# Patient Record
Sex: Male | Born: 1940 | Race: White | Hispanic: No | Marital: Married | Smoking: Current some day smoker
Health system: Southern US, Community
[De-identification: ages and names within clinical notes are randomized; demographics above are authoritative.]

## PROBLEM LIST (undated history)

## (undated) DIAGNOSIS — I10 Essential (primary) hypertension: Secondary | ICD-10-CM

## (undated) DIAGNOSIS — C189 Malignant neoplasm of colon, unspecified: Secondary | ICD-10-CM

## (undated) DIAGNOSIS — I639 Cerebral infarction, unspecified: Secondary | ICD-10-CM

## (undated) DIAGNOSIS — G40919 Epilepsy, unspecified, intractable, without status epilepticus: Secondary | ICD-10-CM

## (undated) DIAGNOSIS — E785 Hyperlipidemia, unspecified: Secondary | ICD-10-CM

## (undated) DIAGNOSIS — J449 Chronic obstructive pulmonary disease, unspecified: Secondary | ICD-10-CM

## (undated) DIAGNOSIS — R4701 Aphasia: Secondary | ICD-10-CM

## (undated) DIAGNOSIS — Z8719 Personal history of other diseases of the digestive system: Secondary | ICD-10-CM

## (undated) DIAGNOSIS — D649 Anemia, unspecified: Secondary | ICD-10-CM

## (undated) DIAGNOSIS — K649 Unspecified hemorrhoids: Secondary | ICD-10-CM

## (undated) DIAGNOSIS — I2510 Atherosclerotic heart disease of native coronary artery without angina pectoris: Secondary | ICD-10-CM

## (undated) DIAGNOSIS — I219 Acute myocardial infarction, unspecified: Secondary | ICD-10-CM

## (undated) DIAGNOSIS — E119 Type 2 diabetes mellitus without complications: Secondary | ICD-10-CM

## (undated) DIAGNOSIS — R0602 Shortness of breath: Secondary | ICD-10-CM

## (undated) HISTORY — PX: APPENDECTOMY: SHX54

## (undated) HISTORY — PX: DENTAL SURGERY: SHX609

## (undated) HISTORY — DX: Atherosclerotic heart disease of native coronary artery without angina pectoris: I25.10

## (undated) HISTORY — DX: Personal history of other diseases of the digestive system: Z87.19

## (undated) HISTORY — PX: CORONARY ARTERY BYPASS GRAFT: SHX141

## (undated) HISTORY — DX: Hyperlipidemia, unspecified: E78.5

## (undated) HISTORY — PX: CORONARY STENT PLACEMENT: SHX1402

## (undated) HISTORY — DX: Epilepsy, unspecified, intractable, without status epilepticus: G40.919

---

## 2001-02-28 ENCOUNTER — Encounter: Payer: Self-pay | Admitting: Emergency Medicine

## 2001-03-01 ENCOUNTER — Inpatient Hospital Stay (HOSPITAL_COMMUNITY): Admission: EM | Admit: 2001-03-01 | Discharge: 2001-03-02 | Payer: Self-pay | Admitting: Emergency Medicine

## 2001-03-01 ENCOUNTER — Encounter: Payer: Self-pay | Admitting: Family Medicine

## 2004-02-13 ENCOUNTER — Inpatient Hospital Stay (HOSPITAL_COMMUNITY): Admission: EM | Admit: 2004-02-13 | Discharge: 2004-02-21 | Payer: Self-pay | Admitting: Emergency Medicine

## 2004-03-14 ENCOUNTER — Encounter
Admission: RE | Admit: 2004-03-14 | Discharge: 2004-03-14 | Payer: Self-pay | Admitting: Thoracic Surgery (Cardiothoracic Vascular Surgery)

## 2006-01-31 ENCOUNTER — Observation Stay (HOSPITAL_COMMUNITY): Admission: EM | Admit: 2006-01-31 | Discharge: 2006-02-01 | Payer: Self-pay | Admitting: Emergency Medicine

## 2010-12-13 ENCOUNTER — Emergency Department (HOSPITAL_COMMUNITY)
Admission: EM | Admit: 2010-12-13 | Discharge: 2010-12-13 | Disposition: A | Discharge status: Home / Self Care | Payer: Medicare Other | Attending: Emergency Medicine | Admitting: Emergency Medicine

## 2010-12-13 DIAGNOSIS — Z79899 Other long term (current) drug therapy: Secondary | ICD-10-CM | POA: Insufficient documentation

## 2010-12-13 DIAGNOSIS — K644 Residual hemorrhoidal skin tags: Secondary | ICD-10-CM | POA: Insufficient documentation

## 2010-12-13 DIAGNOSIS — I2510 Atherosclerotic heart disease of native coronary artery without angina pectoris: Secondary | ICD-10-CM | POA: Insufficient documentation

## 2010-12-13 DIAGNOSIS — E119 Type 2 diabetes mellitus without complications: Secondary | ICD-10-CM | POA: Insufficient documentation

## 2010-12-13 DIAGNOSIS — K6289 Other specified diseases of anus and rectum: Secondary | ICD-10-CM | POA: Insufficient documentation

## 2010-12-13 DIAGNOSIS — Z951 Presence of aortocoronary bypass graft: Secondary | ICD-10-CM | POA: Insufficient documentation

## 2010-12-13 DIAGNOSIS — I10 Essential (primary) hypertension: Secondary | ICD-10-CM | POA: Insufficient documentation

## 2010-12-13 DIAGNOSIS — Z7982 Long term (current) use of aspirin: Secondary | ICD-10-CM | POA: Insufficient documentation

## 2010-12-13 DIAGNOSIS — K602 Anal fissure, unspecified: Secondary | ICD-10-CM | POA: Insufficient documentation

## 2011-12-28 ENCOUNTER — Emergency Department (INDEPENDENT_AMBULATORY_CARE_PROVIDER_SITE_OTHER): Payer: Medicare Other

## 2011-12-28 ENCOUNTER — Inpatient Hospital Stay (HOSPITAL_BASED_OUTPATIENT_CLINIC_OR_DEPARTMENT_OTHER)
Admission: EM | Admit: 2011-12-28 | Discharge: 2012-01-03 | DRG: 065 | Disposition: A | Discharge status: Skilled Nursing Facility | Payer: Medicare Other | Source: Ambulatory Visit | Attending: Internal Medicine | Admitting: Internal Medicine

## 2011-12-28 ENCOUNTER — Encounter (HOSPITAL_BASED_OUTPATIENT_CLINIC_OR_DEPARTMENT_OTHER): Payer: Self-pay | Admitting: Emergency Medicine

## 2011-12-28 DIAGNOSIS — Z7902 Long term (current) use of antithrombotics/antiplatelets: Secondary | ICD-10-CM

## 2011-12-28 DIAGNOSIS — Z79899 Other long term (current) drug therapy: Secondary | ICD-10-CM

## 2011-12-28 DIAGNOSIS — I10 Essential (primary) hypertension: Secondary | ICD-10-CM | POA: Diagnosis present

## 2011-12-28 DIAGNOSIS — I6529 Occlusion and stenosis of unspecified carotid artery: Secondary | ICD-10-CM | POA: Diagnosis present

## 2011-12-28 DIAGNOSIS — I63239 Cerebral infarction due to unspecified occlusion or stenosis of unspecified carotid arteries: Principal | ICD-10-CM | POA: Diagnosis present

## 2011-12-28 DIAGNOSIS — IMO0001 Reserved for inherently not codable concepts without codable children: Secondary | ICD-10-CM | POA: Diagnosis present

## 2011-12-28 DIAGNOSIS — Z043 Encounter for examination and observation following other accident: Secondary | ICD-10-CM

## 2011-12-28 DIAGNOSIS — R569 Unspecified convulsions: Secondary | ICD-10-CM

## 2011-12-28 DIAGNOSIS — R1312 Dysphagia, oropharyngeal phase: Secondary | ICD-10-CM | POA: Diagnosis present

## 2011-12-28 DIAGNOSIS — R4789 Other speech disturbances: Secondary | ICD-10-CM

## 2011-12-28 DIAGNOSIS — E785 Hyperlipidemia, unspecified: Secondary | ICD-10-CM | POA: Diagnosis present

## 2011-12-28 DIAGNOSIS — B370 Candidal stomatitis: Secondary | ICD-10-CM | POA: Diagnosis present

## 2011-12-28 DIAGNOSIS — M5380 Other specified dorsopathies, site unspecified: Secondary | ICD-10-CM

## 2011-12-28 DIAGNOSIS — Y9210 Unspecified residential institution as the place of occurrence of the external cause: Secondary | ICD-10-CM | POA: Diagnosis present

## 2011-12-28 DIAGNOSIS — Y849 Medical procedure, unspecified as the cause of abnormal reaction of the patient, or of later complication, without mention of misadventure at the time of the procedure: Secondary | ICD-10-CM | POA: Diagnosis present

## 2011-12-28 DIAGNOSIS — W19XXXA Unspecified fall, initial encounter: Secondary | ICD-10-CM

## 2011-12-28 DIAGNOSIS — I639 Cerebral infarction, unspecified: Secondary | ICD-10-CM | POA: Diagnosis present

## 2011-12-28 DIAGNOSIS — Z7982 Long term (current) use of aspirin: Secondary | ICD-10-CM

## 2011-12-28 DIAGNOSIS — G8190 Hemiplegia, unspecified affecting unspecified side: Secondary | ICD-10-CM | POA: Diagnosis present

## 2011-12-28 DIAGNOSIS — G319 Degenerative disease of nervous system, unspecified: Secondary | ICD-10-CM

## 2011-12-28 DIAGNOSIS — R4701 Aphasia: Secondary | ICD-10-CM | POA: Diagnosis present

## 2011-12-28 DIAGNOSIS — E1165 Type 2 diabetes mellitus with hyperglycemia: Secondary | ICD-10-CM | POA: Diagnosis present

## 2011-12-28 DIAGNOSIS — I252 Old myocardial infarction: Secondary | ICD-10-CM

## 2011-12-28 DIAGNOSIS — T888XXA Other specified complications of surgical and medical care, not elsewhere classified, initial encounter: Secondary | ICD-10-CM | POA: Diagnosis present

## 2011-12-28 DIAGNOSIS — Z794 Long term (current) use of insulin: Secondary | ICD-10-CM

## 2011-12-28 HISTORY — DX: Acute myocardial infarction, unspecified: I21.9

## 2011-12-28 LAB — CBC
HCT: 41.9 % (ref 39.0–52.0)
HCT: 39.7 % (ref 39.0–52.0)
Hemoglobin: 13.4 g/dL (ref 13.0–17.0)
MCH: 27.2 pg (ref 26.0–34.0)
MCHC: 35.1 g/dL (ref 30.0–36.0)
MCV: 80.7 fL (ref 78.0–100.0)
Platelets: 185 10*3/uL (ref 150–400)
RBC: 5.37 MIL/uL (ref 4.22–5.81)
RBC: 4.92 MIL/uL (ref 4.22–5.81)
WBC: 6.3 10*3/uL (ref 4.0–10.5)

## 2011-12-28 LAB — GLUCOSE, CAPILLARY
Glucose-Capillary: 379 mg/dL — ABNORMAL HIGH (ref 70–99)
Glucose-Capillary: 264 mg/dL — ABNORMAL HIGH (ref 70–99)

## 2011-12-28 LAB — BASIC METABOLIC PANEL
CO2: 27 mEq/L (ref 19–32)
Calcium: 10.5 mg/dL (ref 8.4–10.5)
Chloride: 95 mEq/L — ABNORMAL LOW (ref 96–112)
Creatinine, Ser: 1 mg/dL (ref 0.50–1.35)
GFR calc Af Amer: 86 mL/min — ABNORMAL LOW (ref >90)
Potassium: 4.9 mEq/L (ref 3.5–5.1)
Sodium: 133 mEq/L — ABNORMAL LOW (ref 135–145)

## 2011-12-28 LAB — ETHANOL: Alcohol, Ethyl (B): <11 mg/dL (ref 0–11)

## 2011-12-28 LAB — CREATININE, SERUM: GFR calc Af Amer: >90 mL/min (ref >90)

## 2011-12-28 LAB — PROTIME-INR: INR: 0.89 (ref 0.00–1.49)

## 2011-12-28 MED ORDER — ACETAMINOPHEN 650 MG RE SUPP: 650.0000 mg | RECTAL | Status: DC | PRN

## 2011-12-28 MED ORDER — SODIUM CHLORIDE 0.9 % IV BOLUS (SEPSIS)
1000.0000 mL | Freq: Once | INTRAVENOUS | Status: AC
  Administered 2011-12-28: 1000 mL via INTRAVENOUS

## 2011-12-28 MED ORDER — INSULIN ASPART 100 UNIT/ML ~~LOC~~ SOLN
0.0000 [IU] | SUBCUTANEOUS | Status: DC
  Administered 2011-12-28: 5 [IU] via SUBCUTANEOUS
  Administered 2011-12-29 (×3): 3 [IU] via SUBCUTANEOUS
  Administered 2011-12-29: 1 [IU] via SUBCUTANEOUS
  Administered 2011-12-29 – 2011-12-30 (×2): 2 [IU] via SUBCUTANEOUS

## 2011-12-28 MED ORDER — SENNOSIDES-DOCUSATE SODIUM 8.6-50 MG PO TABS: 1.0000 | ORAL_TABLET | Freq: Every evening | ORAL | Status: DC | PRN

## 2011-12-28 MED ORDER — SODIUM CHLORIDE 0.9 % IV SOLN
INTRAVENOUS | Status: DC
  Administered 2011-12-29: 16:00:00 via INTRAVENOUS
  Administered 2011-12-29: 125 mL/h via INTRAVENOUS

## 2011-12-28 MED ORDER — PANTOPRAZOLE SODIUM 40 MG IV SOLR
40.0000 mg | INTRAVENOUS | Status: DC
  Administered 2011-12-28: 40 mg via INTRAVENOUS
  Filled 2011-12-28 (×2): qty 40

## 2011-12-28 MED ORDER — ONDANSETRON HCL 4 MG/2ML IJ SOLN: 4.0000 mg | Freq: Four times a day (QID) | INTRAMUSCULAR | Status: DC | PRN

## 2011-12-28 MED ORDER — ENOXAPARIN SODIUM 40 MG/0.4ML ~~LOC~~ SOLN
40.0000 mg | Freq: Every day | SUBCUTANEOUS | Status: DC
  Administered 2011-12-28 – 2011-12-29 (×2): 40 mg via SUBCUTANEOUS
  Filled 2011-12-28 (×3): qty 0.4

## 2011-12-28 MED ORDER — ASPIRIN 300 MG RE SUPP
300.0000 mg | Freq: Every day | RECTAL | Status: DC
  Filled 2011-12-28 (×4): qty 1

## 2011-12-28 MED ORDER — HYDRALAZINE HCL 20 MG/ML IJ SOLN
5.0000 mg | Freq: Three times a day (TID) | INTRAMUSCULAR | Status: DC | PRN
  Filled 2011-12-28: qty 0.25

## 2011-12-28 MED ORDER — ASPIRIN 325 MG PO TABS
325.0000 mg | ORAL_TABLET | Freq: Every day | ORAL | Status: DC
  Administered 2011-12-29 – 2012-01-01 (×4): 325 mg via ORAL
  Filled 2011-12-28 (×4): qty 1

## 2011-12-28 MED ORDER — ACETAMINOPHEN 325 MG PO TABS: 650.0000 mg | ORAL_TABLET | ORAL | Status: DC | PRN

## 2011-12-28 MED ORDER — SODIUM CHLORIDE 0.9 % IV SOLN
INTRAVENOUS | Status: AC
  Administered 2011-12-28: 15:00:00 via INTRAVENOUS

## 2011-12-28 NOTE — ED Notes (Signed)
Family at bedside. 

## 2011-12-28 NOTE — ED Notes (Signed)
Returned from CT.  No change in pt condition.

## 2011-12-28 NOTE — H&P (Signed)
PCP:   Betsey Holiday   Chief Complaint:  Larey Seat.  HPI: [name] was transferred from [facility] after fall on the porch. History not clear. He was found to have dysarthric speech and right sided weakness. CT brain did not show a bleed. As he was discovered on the porch in the morning, with him last said to be normal last night, he was outside tpa window, hence code stroke not called at med center. Patient attempts to speak but he is dysarthric and difficult to understand but follows instructions.  Review of Systems:  The patient denies anorexia, fever, weight loss,, vision loss, decreased hearing, hoarseness, chest pain, syncope, dyspnea on exertion, peripheral edema, balance deficits, hemoptysis, abdominal pain, melena, hematochezia, severe indigestion/heartburn, hematuria, incontinence, genital sores, muscle weakness, suspicious skin lesions, transient blindness, difficulty walking, depression, unusual weight change, abnormal bleeding, enlarged lymph nodes, angioedema, and breast masses.  Past Medical History: Past Medical History  Diagnosis Date  . Myocardial infarction    History reviewed. No pertinent past surgical history.  Medications: Prior to Admission medications   Medication Sig Start Date End Date Taking? Authorizing Provider  aspirin 81 MG tablet Take 81 mg by mouth daily.   Yes Historical Provider, MD  doxycycline (DORYX) 100 MG DR capsule Take 100 mg by mouth 2 (two) times daily. Until gone #20 filled on 12/26/11   Yes Historical Provider, MD  ezetimibe-simvastatin (VYTORIN) 10-40 MG per tablet Take 1 tablet by mouth at bedtime.   Yes Historical Provider, MD  oxazepam (SERAX) 30 MG capsule Take 30 mg by mouth at bedtime.   Yes Historical Provider, MD  sitaGLIPtan-metformin (JANUMET) 50-1000 MG per tablet Take 1 tablet by mouth 2 (two) times daily with a meal.   Yes Historical Provider, MD  valsartan (DIOVAN) 160 MG tablet Take 160 mg by mouth daily.   Yes  Historical Provider, MD    Allergies:  No Known Allergies  Social History:  has an unknown smoking status. He does not have any smokeless tobacco history on file. He reports that he does not drink alcohol or use illicit drugs.   Family History: History reviewed. No pertinent family history.  Physical Exam: Filed Vitals:   12/28/11 1415 12/28/11 1529 12/28/11 1532 12/28/11 1630  BP: 96/57 120/65  133/79  Pulse: 59 55  61  Temp:      TempSrc:      Resp:  16  16  Height:      Weight:      SpO2: 100% 100% 100% 100%   Lying in bed, not in distress. PERRLA. No jvd, no carotid bruits. No facial asymmetry. Lungs clear. S1S2 heard. No murmurs. RRR. Abdomen soft, non tender. +BS CNS- dysarthric, power 3/5 right arm, 4/5 right leg. Extremities- no pedal edema.   Labs on Admission:   Basename 12/28/11 1005  NA 133*  K 4.9  CL 95*  CO2 27  GLUCOSE 508*  BUN 20  CREATININE 1.00  CALCIUM 10.5  MG --  PHOS --   No results found for this basename: AST:2,ALT:2,ALKPHOS:2,BILITOT:2,PROT:2,ALBUMIN:2 in the last 72 hours No results found for this basename: LIPASE:2,AMYLASE:2 in the last 72 hours  Basename 12/28/11 1005  WBC 6.3  NEUTROABS --  HGB 14.7  HCT 41.9  MCV 78.0  PLT 184    Basename 12/28/11 1005  CKTOTAL --  CKMB --  CKMBINDEX --  TROPONINI <0.30   No results found for this basename: TSH,T4TOTAL,FREET3,T3FREE,THYROIDAB in the last 72 hours No results  found for this basename: VITAMINB12:2,FOLATE:2,FERRITIN:2,TIBC:2,IRON:2,RETICCTPCT:2 in the last 72 hours  Radiological Exams on Admission: Dg Chest 2 View  12/28/2011  *RADIOLOGY REPORT*  Clinical Data: Fall, slurred speech.  CHEST - 2 VIEW  Comparison: None.  Findings: Prior CABG. Heart and mediastinal contours are within normal limits.  No focal opacities or effusions.  No acute bony abnormality.  IMPRESSION: No active cardiopulmonary disease.  Original Report Authenticated By: Cyndie Chime, M.D.   Ct Head Wo  Contrast  12/28/2011  *RADIOLOGY REPORT*  Clinical Data:  Fall, trauma, seizure  CT HEAD WITHOUT CONTRAST CT CERVICAL SPINE WITHOUT CONTRAST  Technique:  Multidetector CT imaging of the head and cervical spine was performed following the standard protocol without intravenous contrast.  Multiplanar CT image reconstructions of the cervical spine were also generated.  Comparison:  None  CT HEAD  Findings: No intracranial hemorrhage.  No parenchymal contusion. No midline shift or mass effect.  Basilar cisterns are patent. No skull base fracture.  No fluid in the paranasal sinuses or mastoid air cells.  There is periventricular white matter hypodensities.  There is generalized cortical atrophy.  IMPRESSION: No intracranial trauma.  Atrophy and microvascular disease.  CT CERVICAL SPINE  Findings: There is reversal of normal cervical lordosis.  No prevertebral soft tissue swelling.  The there is endplate osteophytosis joint space narrowing at C6-C7.  No loss vertebral body height q. leak.  Normal facet to dilation.  Normal precervical junction.  There is some stranding within the subcutaneous tissues posterior X of the occipital lobe and C2 vertebral body (image 30)  No epidural hematoma is evident.  IMPRESSION:  1.  No evidence of acute cervical spine fracture. 2.  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 3.  Subcutaneous stranding posterior to the C2 vertebral body may relate to trauma. 4. Straightening of the normal cervical lordosis may be secondary to position, muscle spasm, or ligamentous injury.  Original Report Authenticated By: Genevive Bi, M.D.   Ct Cervical Spine Wo Contrast  12/28/2011  *RADIOLOGY REPORT*  Clinical Data:  Fall, trauma, seizure  CT HEAD WITHOUT CONTRAST CT CERVICAL SPINE WITHOUT CONTRAST  Technique:  Multidetector CT imaging of the head and cervical spine was performed following the standard protocol without intravenous contrast.  Multiplanar CT image reconstructions of the  cervical spine were also generated.  Comparison:  None  CT HEAD  Findings: No intracranial hemorrhage.  No parenchymal contusion. No midline shift or mass effect.  Basilar cisterns are patent. No skull base fracture.  No fluid in the paranasal sinuses or mastoid air cells.  There is periventricular white matter hypodensities.  There is generalized cortical atrophy.  IMPRESSION: No intracranial trauma.  Atrophy and microvascular disease.  CT CERVICAL SPINE  Findings: There is reversal of normal cervical lordosis.  No prevertebral soft tissue swelling.  The there is endplate osteophytosis joint space narrowing at C6-C7.  No loss vertebral body height q. leak.  Normal facet to dilation.  Normal precervical junction.  There is some stranding within the subcutaneous tissues posterior X of the occipital lobe and C2 vertebral body (image 30)  No epidural hematoma is evident.  IMPRESSION:  1.  No evidence of acute cervical spine fracture. 2.  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 3.  Subcutaneous stranding posterior to the C2 vertebral body may relate to trauma. 4. Straightening of the normal cervical lordosis may be secondary to position, muscle spasm, or ligamentous injury.  Original Report Authenticated By: Genevive Bi, M.D.  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 ? Cause. Plan .CVA (cerebral infarction)- admit tele. Stroke protocol. ASA. Risk factor modification. Consider stroke team consult in am. .DM (diabetes mellitus), type 2, uncontrolled- check hb a1c. SSI for now. Marland KitchenHTN (hypertension), benign- hydralazine prn. Dvt/gi prophylaxis. Condition closely guarded.  Kyasia Steuck [phone]. 12/28/2011, 6:43 PM

## 2011-12-28 NOTE — ED Notes (Signed)
Initial assessment documented as pt had a prior CVA with right sided weakness per EMS.  Family reports pt has NO history of prior CVA, right sided weakness or slurred speech.  Wife states pt awakened about 0700 this morning, had coffee but she is unable to recall if she actually spoke with him. States pt went outside "to turn water on" walked onto porch and she saw him laying on the porch.  EMS called and pt transported to ED. C-collar removed per MD order.

## 2011-12-28 NOTE — ED Provider Notes (Signed)
History     CSN: [id number]  Arrival date & time 12/28/11  8295   First MD Initiated Contact with Patient 12/28/11 1002      Chief Complaint  Patient presents with  . Fall     The history is provided by the EMS personnel. History Limited By: Level V caveat: Dysarthric speech.   EMS reports they were called to the patient's house today because of a fall on the porch.  EMS reports the patient was noted to have dysarthric speech and right-sided weakness which the wife reported was baseline for him as there was possibly a prior history of stroke.  The patient complained of generalized pain to them but was able to move all extremities.  EMS did note his weakness in his right arm.  The patient has a history of hypertension diabetes and high cholesterol.  Some clear he smokes cigarettes.  There is no family available to provide additional history.   Past medical history: Hypertension, diabetes, hyperlipidemia  History reviewed. No pertinent past surgical history.  History reviewed. No pertinent family history.  History  Substance Use Topics  . Smoking status: Unknown If Ever Smoked  . Smokeless tobacco: Not on file  . Alcohol Use: No      Review of Systems  Unable to perform ROS   Allergies  Review of patient's allergies indicates no known allergies.  Home Medications   Current Outpatient Rx  Name Route Sig Dispense Refill  . ASPIRIN 81 MG PO TABS Oral Take 81 mg by mouth daily.    Marland Kitchen DOXYCYCLINE HYCLATE 100 MG PO CPEP Oral Take 100 mg by mouth 2 (two) times daily. Until gone #20 filled on 12/26/11    . EZETIMIBE-SIMVASTATIN 10-40 MG PO TABS Oral Take 1 tablet by mouth at bedtime.    Marland Kitchen OXAZEPAM 30 MG PO CAPS Oral Take 30 mg by mouth at bedtime.    Marland Kitchen SITAGLIPTIN-METFORMIN HCL 50-1000 MG PO TABS Oral Take 1 tablet by mouth 2 (two) times daily with a meal.    . VALSARTAN 160 MG PO TABS Oral Take 160 mg by mouth daily.      BP 111/62  Pulse 64  Temp(Src) 98 F (36.7 C) (Oral)   Resp 20  SpO2 100%  Physical Exam  Nursing note and vitals reviewed. Constitutional: He appears well-developed and well-nourished.  HENT:  Head: Normocephalic and atraumatic.  Eyes: EOM are normal.  Neck: Normal range of motion.  Cardiovascular: Normal rate, regular rhythm, normal heart sounds and intact distal pulses.   Pulmonary/Chest: Effort normal and breath sounds normal. No respiratory distress. He exhibits no tenderness.  Abdominal: Soft. He exhibits no distension. There is no tenderness. There is no rebound and no guarding.  Musculoskeletal: Normal range of motion.       Small right posterior calf erythema with necrotic middle without spreading erythema or fluctuance  Neurological: He is alert.       Follows commands.  Speech is dysarthric.4/5 right arm and right leg strength as compared to 5/5 left arm and left leg strength.   Skin: Skin is warm and dry.  Psychiatric: He has a normal mood and affect. Judgment normal.    ED Course  Procedures     Date: 12/28/2011  Rate: 68  Rhythm: normal sinus rhythm  QRS Axis: normal  Intervals: normal  ST/T Wave abnormalities: normal  Conduction Disutrbances: none  Narrative Interpretation:   Old EKG Reviewed: No significant changes noted    Labs Reviewed  BASIC METABOLIC PANEL - Abnormal; Notable for the following:    Sodium 133 (*)    Chloride 95 (*)    Glucose, Bld 508 (*)    GFR calc non Af Amer 74 (*)    GFR calc Af Amer 86 (*)    All other components within normal limits  CBC  ETHANOL  PROTIME-INR  TROPONIN I   Dg Chest 2 View  12/28/2011  *RADIOLOGY REPORT*  Clinical Data: Fall, slurred speech.  CHEST - 2 VIEW  Comparison: None.  Findings: Prior CABG. Heart and mediastinal contours are within normal limits.  No focal opacities or effusions.  No acute bony abnormality.  IMPRESSION: No active cardiopulmonary disease.  Original Report Authenticated By: Cyndie Chime, M.D.   Ct Head Wo Contrast  12/28/2011   *RADIOLOGY REPORT*  Clinical Data:  Fall, trauma, seizure  CT HEAD WITHOUT CONTRAST CT CERVICAL SPINE WITHOUT CONTRAST  Technique:  Multidetector CT imaging of the head and cervical spine was performed following the standard protocol without intravenous contrast.  Multiplanar CT image reconstructions of the cervical spine were also generated.  Comparison:  None  CT HEAD  Findings: No intracranial hemorrhage.  No parenchymal contusion. No midline shift or mass effect.  Basilar cisterns are patent. No skull base fracture.  No fluid in the paranasal sinuses or mastoid air cells.  There is periventricular white matter hypodensities.  There is generalized cortical atrophy.  IMPRESSION: No intracranial trauma.  Atrophy and microvascular disease.  CT CERVICAL SPINE  Findings: There is reversal of normal cervical lordosis.  No prevertebral soft tissue swelling.  The there is endplate osteophytosis joint space narrowing at C6-C7.  No loss vertebral body height q. leak.  Normal facet to dilation.  Normal precervical junction.  There is some stranding within the subcutaneous tissues posterior X of the occipital lobe and C2 vertebral body (image 30)  No epidural hematoma is evident.  IMPRESSION:  1.  No evidence of acute cervical spine fracture. 2.  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 3.  Subcutaneous stranding posterior to the C2 vertebral body may relate to trauma. 4. Straightening of the normal cervical lordosis may be secondary to position, muscle spasm, or ligamentous injury.  Original Report Authenticated By: Genevive Bi, M.D.   Ct Cervical Spine Wo Contrast  12/28/2011  *RADIOLOGY REPORT*  Clinical Data:  Fall, trauma, seizure  CT HEAD WITHOUT CONTRAST CT CERVICAL SPINE WITHOUT CONTRAST  Technique:  Multidetector CT imaging of the head and cervical spine was performed following the standard protocol without intravenous contrast.  Multiplanar CT image reconstructions of the cervical spine were also  generated.  Comparison:  None  CT HEAD  Findings: No intracranial hemorrhage.  No parenchymal contusion. No midline shift or mass effect.  Basilar cisterns are patent. No skull base fracture.  No fluid in the paranasal sinuses or mastoid air cells.  There is periventricular white matter hypodensities.  There is generalized cortical atrophy.  IMPRESSION: No intracranial trauma.  Atrophy and microvascular disease.  CT CERVICAL SPINE  Findings: There is reversal of normal cervical lordosis.  No prevertebral soft tissue swelling.  The there is endplate osteophytosis joint space narrowing at C6-C7.  No loss vertebral body height q. leak.  Normal facet to dilation.  Normal precervical junction.  There is some stranding within the subcutaneous tissues posterior X of the occipital lobe and C2 vertebral body (image 30)  No epidural hematoma is evident.  IMPRESSION:  1.  No evidence of acute  cervical spine fracture. 2.  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 3.  Subcutaneous stranding posterior to the C2 vertebral body may relate to trauma. 4. Straightening of the normal cervical lordosis may be secondary to position, muscle spasm, or ligamentous injury.  Original Report Authenticated By: Genevive Bi, M.D.   I personally reviewed the images  1. Stroke       MDM  The patient is on a candidate for TPA as there is no definitive last seen normal time besides yesterday evening.  Initially the EMS reported that the patient's slurred speech and weakness was baseline however when family showed up they've reported that these were new findings.  From a trauma fall standpoint he had a chest x-ray CT head CT C-spine all of which was normal.  He has full range of motion of his major joints bilaterally in both his upper and lower extremities.  He does have slurred speech as well as right arm or right leg weakness.  Per family this is new. 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  The family also reports increasing  falls over the past month.  He reports he saw his doctor this week for a new lesion on his right posterior calf that was a possible bug bite for which she was started on doxycycline.      12:34 PM Accepted to [facility] by Dr Venetia Constable. Requests tx of his hyperglycemia  Lyanne Co, MD 12/28/11 1242

## 2011-12-28 NOTE — ED Notes (Signed)
Pt assisted to [facility] and loose stool noted.  [facility] at bedside.

## 2011-12-28 NOTE — ED Notes (Signed)
Patient transported to CT 

## 2011-12-28 NOTE — ED Notes (Signed)
Pt arrived via [facility] fully immobilized for fall on front porch. Pt c/o generalized pain. Pt able to move all extremities. Pt has weakness to right arm per EMS with injury to right arm during fall. Wife sts pt is at baseline with slurred speech per EMS.

## 2011-12-28 NOTE — ED Notes (Signed)
MD order to keep pt NPO.  Speech is slurred and swallow evaluation performed (see documentation)

## 2011-12-28 NOTE — ED Notes (Signed)
Dr. Patria Mane at bedside speaking with family.

## 2011-12-28 NOTE — ED Notes (Signed)
Family informed of plan of care.  Awaiting admission bed. EMTALA consent obtained from daughter and placed on chart.

## 2011-12-29 ENCOUNTER — Inpatient Hospital Stay (HOSPITAL_COMMUNITY): Payer: Medicare Other

## 2011-12-29 DIAGNOSIS — I6789 Other cerebrovascular disease: Secondary | ICD-10-CM

## 2011-12-29 LAB — COMPREHENSIVE METABOLIC PANEL
ALT: 7 U/L (ref 0–53)
ALT: 8 U/L (ref 0–53)
AST: 19 U/L (ref 0–37)
Alkaline Phosphatase: 75 U/L (ref 39–117)
BUN: 14 mg/dL (ref 6–23)
CO2: 22 mEq/L (ref 19–32)
CO2: 23 mEq/L (ref 19–32)
Chloride: 106 mEq/L (ref 96–112)
Chloride: 104 mEq/L (ref 96–112)
Creatinine, Ser: 0.8 mg/dL (ref 0.50–1.35)
GFR calc Af Amer: >90 mL/min (ref >90)
GFR calc Af Amer: >90 mL/min (ref >90)
GFR calc non Af Amer: 88 mL/min — ABNORMAL LOW (ref >90)
Glucose, Bld: 147 mg/dL — ABNORMAL HIGH (ref 70–99)
Glucose, Bld: 256 mg/dL — ABNORMAL HIGH (ref 70–99)
Potassium: 3.8 mEq/L (ref 3.5–5.1)
Sodium: 137 mEq/L (ref 135–145)
Total Bilirubin: 0.2 mg/dL — ABNORMAL LOW (ref 0.3–1.2)
Total Bilirubin: 0.3 mg/dL (ref 0.3–1.2)

## 2011-12-29 LAB — CBC
HCT: 36.8 % — ABNORMAL LOW (ref 39.0–52.0)
Hemoglobin: 12.6 g/dL — ABNORMAL LOW (ref 13.0–17.0)
RBC: 4.6 MIL/uL (ref 4.22–5.81)
WBC: 6.4 10*3/uL (ref 4.0–10.5)

## 2011-12-29 LAB — GLUCOSE, CAPILLARY
Glucose-Capillary: 230 mg/dL — ABNORMAL HIGH (ref 70–99)
Glucose-Capillary: 247 mg/dL — ABNORMAL HIGH (ref 70–99)
Glucose-Capillary: 93 mg/dL (ref 70–99)

## 2011-12-29 LAB — HEMOGLOBIN A1C
Hgb A1c MFr Bld: 14.4 % — ABNORMAL HIGH (ref <5.7)
Mean Plasma Glucose: 367 mg/dL — ABNORMAL HIGH (ref <117)

## 2011-12-29 LAB — TSH: TSH: 1.374 u[IU]/mL (ref 0.350–4.500)

## 2011-12-29 LAB — LIPID PANEL: Cholesterol: 172 mg/dL (ref 0–200)

## 2011-12-29 MED ORDER — STARCH (THICKENING) PO POWD
ORAL | Status: DC | PRN
  Filled 2011-12-29: qty 227

## 2011-12-29 MED ORDER — EZETIMIBE-SIMVASTATIN 10-40 MG PO TABS
1.0000 | ORAL_TABLET | Freq: Every day | ORAL | Status: DC
  Administered 2011-12-29 – 2012-01-02 (×5): 1 via ORAL
  Filled 2011-12-29 (×6): qty 1

## 2011-12-29 MED ORDER — PANTOPRAZOLE SODIUM 40 MG PO TBEC
40.0000 mg | DELAYED_RELEASE_TABLET | Freq: Every day | ORAL | Status: DC
  Administered 2011-12-29 – 2012-01-02 (×5): 40 mg via ORAL
  Filled 2011-12-29 (×5): qty 1

## 2011-12-29 NOTE — Procedures (Signed)
Objective Swallowing Evaluation: Modified Barium Swallowing Study  Patient Details  Name: [name] MRN: [id number] Date of Birth: Nov 17, 1940  Today's Date: 12/29/2011 Time:  -     Past Medical History:  Past Medical History  Diagnosis Date  . Myocardial infarction    Past Surgical History: History reviewed. No pertinent past surgical history. HPI:  Pt is a 71 yo male adm to [facility] after falling on porch, pt found to have right sided weakness, dysarthric with ability to follow commands.  CT Head showed perventricular white matter changes, nothing acute.  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  CXR 12/28/11 no acute disease.  Pt did not pass RN stroke swallow screen and bedside swallow evaluation was subsequently ordered.  Per RN, pt resides at home with his wife, no family present to etablish baseline function.  BSE indicated signs of significant dysphagia, MBS to determine safety with POs.      Recommendation/Prognosis  Clinical Impression Dysphagia Diagnosis: Moderate oral phase dysphagia;Moderate pharyngeal phase dysphagia Clinical impression: Mr. Nuttall presents with a moderate oral dysphagia due to right lingual weakness with reduced propulsion of bolus which results in lingual residuals falling to pharynx post swallow. Oral dysphagia also causes the pt to utilize a posterior head tilt to aid in bolus transit. This posture increases risk of aspiration due to already present sensory deficits resulting in delay in swallow initiaion to the pyriform sinuses with nectar thick liquids.  In one instance, this resulted in gross silent aspiration of nectar thick liquids. Due to cognitive linguistic impairment the pt is not able to utlize compensatory strategies or reliably follow commands.  At this time he is recommended to initiate a dys 1 (puree) diet with honey thick liquids. If pt is able to take small sips with apprioriate posture nectar thick liquids may be considered.    Swallow Evaluation Recommendations Recommended Consults: MBS Diet Recommendations: Dysphagia 1 (Puree);Honey-thick liquid Liquid Administration via: Cup;No straw Medication Administration: Crushed with puree Supervision: Full supervision/cueing for compensatory strategies;Patient able to self feed Compensations: Slow rate;Small sips/bites;Clear throat intermittently Postural Changes and/or Swallow Maneuvers: Seated upright 90 degrees;Upright 30-60 min after meal Oral Care Recommendations: Oral care QID Other Recommendations: Order thickener from pharmacy Recommendations for Other Services: Rehab consult Follow up Recommendations: Inpatient Rehab Prognosis Prognosis for Safe Diet Advancement: Good Barriers to Reach Goals: Severity of dysphagia Individuals Consulted Consulted and Agree with Results and Recommendations: Patient unable/family or caregiver not available;MD (pt informed of plan,? ability for consent d/t speech deficit)  SLP Assessment/Plan Dysphagia Diagnosis: Moderate oral phase dysphagia;Moderate pharyngeal phase dysphagia Clinical impression: Mr. Ruhe presents with a moderate oral dysphagia due to right lingual weakness with reduced propulsion of bolus which results in lingual residuals falling to pharynx post swallow. Oral dysphagia also causes the pt to utilize a posterior head tilt to aid in bolus transit. This posture increases risk of aspiration due to already present sensory deficits resulting in delay in swallow initiaion to the pyriform sinuses with nectar thick liquids.  In one instance, this resulted in gross silent aspraiton of nectar thick liquids. Due to cognitive linguistic impairment the pt is not able to utlize compensatory strategies or reliably follow commands.  At this time he is recommended to initiate a dys 1 (puree) diet with honey thick liquids. If pt is able to take small sips with apprioriate posture nectar thick liquids may be considered.   SLP  Goals  SLP Swallowing Goals Patient will consume recommended diet without observed  clinical signs of aspiration with: Moderate assistance Patient will utilize recommended strategies during swallow to increase swallowing safety with: Moderate cueing  General:  Date of Onset: 12/28/11 HPI: Pt is a 71 yo male adm to [facility] hospital after falling on porch, pt found to have right sided weakness, dysarthric with ability to follow commands.  CT Head showed perventricular white matter changes, nothing acute.  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  CXR 12/28/11 no acute disease.  Pt did not pass RN stroke swallow screen and bedside swallow evaluation was subsequently ordered.  Per RN, pt resides at home with his wife, no family present to etablish baseline function.  BSE indicated signs of significant dysphagia, MBS to determine safety with POs.  Type of Study: Modified Barium Swallowing Study Previous Swallow Assessment: none in [facility] charting system Diet Prior to this Study: NPO Temperature Spikes Noted: No Respiratory Status: Room air History of Intubation: No Behavior/Cognition: Alert;Cooperative Oral Cavity - Dentition: Edentulous Oral Motor / Sensory Function: Impaired - see Bedside swallow eval Vision: Functional for self-feeding Patient Positioning: Upright in chair Baseline Vocal Quality: Low vocal intensity Volitional Cough: Cognitively unable to elicit Volitional Swallow: Unable to elicit Anatomy: Other (Comment) (Appearance of bony protrusion at C5/6. Asymptomatic) Pharyngeal Secretions: Not observed secondary MBS  Oral Phase Oral Preparation/Oral Phase Oral Phase: Impaired Oral - Honey Oral - Honey Cup: Lingual/palatal residue;Weak lingual manipulation;Incomplete tongue to palate contact;Reduced posterior propulsion;Delayed oral transit;Right anterior bolus loss (Pt utilizing head tilt to transit bolus) Oral - Nectar Oral - Nectar Teaspoon: Lingual/palatal residue;Weak lingual  manipulation;Incomplete tongue to palate contact;Reduced posterior propulsion;Delayed oral transit;Right anterior bolus loss Oral - Nectar Cup: Lingual/palatal residue;Weak lingual manipulation;Incomplete tongue to palate contact;Reduced posterior propulsion;Delayed oral transit;Right anterior bolus loss Oral - Solids Oral - Puree: Lingual/palatal residue;Weak lingual manipulation;Incomplete tongue to palate contact;Reduced posterior propulsion;Delayed oral transit;Right anterior bolus loss Oral - Mechanical Soft: Impaired mastication;Weak lingual manipulation;Incomplete tongue to palate contact;Reduced posterior propulsion;Delayed oral transit;Lingual/palatal residue Oral - Pill: Lingual/palatal residue;Weak lingual manipulation;Incomplete tongue to palate contact;Reduced posterior propulsion;Delayed oral transit;Holding of bolus (Pill removed by SLP, pt unable to orally transit with puree) Pharyngeal Phase  Pharyngeal Phase Pharyngeal Phase: Impaired Pharyngeal - Honey Pharyngeal - Honey Cup: Delayed swallow initiation;Premature spillage to valleculae;Pharyngeal residue - pyriform;Pharyngeal residue - valleculae Pharyngeal - Nectar Pharyngeal - Nectar Teaspoon: Premature spillage to pyriform;Delayed swallow initiation;Penetration/Aspiration before swallow;Pharyngeal residue - pyriform;Pharyngeal residue - valleculae Penetration/Aspiration details (nectar teaspoon): Material enters airway, remains ABOVE vocal cords and not ejected out Pharyngeal - Nectar Cup: Premature spillage to pyriform;Delayed swallow initiation;Penetration/Aspiration before swallow;Pharyngeal residue - valleculae;Pharyngeal residue - pyriform;Significant aspiration (Amount);Compensatory strategies attempted (Comment) (chin tuck, cues for small sips, ineffective. ) Penetration/Aspiration details (nectar cup): Material enters airway, passes BELOW cords without attempt by patient to eject out (silent aspiration);Material does not  enter airway;Material enters airway, CONTACTS cords and not ejected out Pharyngeal - Solids Pharyngeal - Puree: Premature spillage to valleculae;Delayed swallow initiation;Pharyngeal residue - valleculae;Pharyngeal residue - pyriform Pharyngeal - Mechanical Soft: Reduced tongue base retraction;Pharyngeal residue - valleculae;Pharyngeal residue - pyriform;Delayed swallow initiation;Premature spillage to valleculae (Pt sneezed and sucked majority of bolus from base of tongue ) Cervical Esophageal Phase  Cervical Esophageal Phase Cervical Esophageal Phase: [facility]  Harlon Ditty, MA CCC-SLP [phone]  Claudine Mouton 12/29/2011, 11:26 AM

## 2011-12-29 NOTE — Evaluation (Addendum)
Clinical/Bedside Swallow Evaluation Patient Details  Name: ZYAN COBY MRN: [id number] DOB: 1941-07-06 Today's Date: 12/29/2011  Past Medical History:  Past Medical History  Diagnosis Date  . Myocardial infarction    Past Surgical History: History reviewed. No pertinent past surgical history. HPI:  Pt is a 71 yo male adm to [facility] hospital after falling on porch, pt found to have right sided weakness, dysarthric with ability to follow commands.  CT Head showed perventricular white matter changes, nothing acute.  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  CXR 12/28/11 no acute disease.  Pt did not pass RN stroke swallow screen and bedside swallow evaluation was subsequently ordered.  Per RN, pt resides at home with his wife, no family present to establish baseline function.     Assessment/Recommendations/Treatment Plan   SLP Assessment Clinical Impression Statement: Pt currently presents with s/s of oropharyngeal dysphagia with glossopharyngeal, vagus, hypoglossal, and possible trigeminal impairment.  Retained viscous secretions removed from soft palate with toothette by SLP after pt did not expectorate secretions per SLP verbal cue.  Dysphagia characterized by suspected delayed oral transting with suspected pharyngeal delay and multiple swallows (possibly indicative of residuals).  Delayed cough noted which is possbily due to aspirates reaching the carina.  In addition, pt's cervical osteophytes (C5-C7 per DG C-spine) may impact pharyngeal clearance.  Rec instrumental evaluation to determine if pt is appropriate for po diet and diet consistency.  Clinical bedside swallow testing was abbreviated today due to MD arriving to evaluate pt.    Risk for Aspiration: Severe Other Related Risk Factors: Lethargy;Decreased management of secretions;Decreased respiratory status  Swallow Evaluation Recommendations Recommended Consults: MBS Diet Recommendations: NPO Medication Administration: Via  alternative means Oral Care Recommendations: Oral care QID Follow up Recommendations: Other (comment) (possibly CIR or SNF depending on level of assist at home)  Treatment Plan Treatment Plan Recommendations: F/U MBS in ___ days (Comment) (today- plan to be determined after MBS)  Prognosis Prognosis for Safe Diet Advancement: Guarded Barriers to Reach Goals: Severity of dysphagia  Individuals Consulted Consulted and Agree with Results and Recommendations: Patient unable/family or caregiver not available;MD (pt informed of plan,? ability for pt to consent d/t speech/cogling deficit)  Swallowing Goals  SLP Swallowing Goals Goal #3: To be determined after MBS  Swallow Study Prior Functional Status   Uncertain of previous status, family not present.    General  Date of Onset: 12/28/11 HPI: Pt is a 71 yo male adm to [facility] hospital after falling on porch, pt found to have right sided weakness, dysarthric with ability to follow commands.  CT Head showed perventricular white matter changes, nothing acute.  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  CXR 12/28/11 no acute disease.  Pt did not pass RN stroke swallow screen and bedside swallow evaluation was subsequently ordered.  Per RN, pt resides at home with his wife, no family present to etablish baseline function.   Type of Study: Bedside swallow evaluation Previous Swallow Assessment: none in [facility] charting system Diet Prior to this Study: NPO;IV Temperature Spikes Noted: No Respiratory Status: Supplemental O2 delivered via (comment) (2 liters) History of Intubation: No Behavior/Cognition: Lethargic;Cooperative;Pleasant mood Oral Cavity - Dentition: Edentulous (uncertain if pt has dentures, but he indicated not) Patient Positioning: Upright in bed Baseline Vocal Quality: Other (comment) (appears apraxic, weak cough/throat clear) Volitional Swallow: Unable to elicit  Oral Motor/Sensory Function  Overall Oral Motor/Sensory Function:  Impaired Labial ROM: Reduced right Labial Symmetry: Abnormal symmetry right Labial Strength: Reduced Labial Sensation:  Reduced Lingual ROM: Reduced right Lingual Strength: Reduced Facial ROM: Reduced right Facial Symmetry: Right droop;Right drooping eyelid Facial Strength: Reduced Velum:  (difficult to view-appeared bilateral) Mandible:  (DNT)  Consistency Results  Ice Chips Ice chips: Not tested  Thin Liquid Thin Liquid: Not tested  Nectar Thick Liquid Nectar Thick Liquid: Impaired Presentation: Cup;Self Fed Oral phase functional implications: Left anterior spillage Pharyngeal Phase Impairments: Delayed Swallow;Decreased hyoid-laryngeal movement;Multiple swallows;Cough - Delayed Other Comments: pt appears impulsive takes large boluses which could be secondary to hunger/thirst -  pt aware of anterior labial spillage on left, used hand to clear, suspect delayed sensation to aspiration *possibly aspirates contacting carina  Honey Thick Liquid Honey Thick Liquid: Not tested  Puree Puree: Not tested  Solid Solid: Not tested  Donavan Burnet, MS [facility] SLP [phone]

## 2011-12-29 NOTE — Evaluation (Signed)
Physical Therapy Evaluation Patient Details Name: [name] MRN: [id number] DOB: [birth year] Today's Date: 12/29/2011  Problem List:  Patient Active Problem List  Diagnoses  . CVA (cerebral infarction)  . DM (diabetes mellitus), type 2, uncontrolled  . HTN (hypertension), benign    Past Medical History:  Past Medical History  Diagnosis Date  . Myocardial infarction    Past Surgical History: History reviewed. No pertinent past surgical history.  PT Assessment/Plan/Recommendation PT Assessment Clinical Impression Statement: Pt adm with rt sided weakness and aphasia.  Pt found to lt. MCA infarct.  Unsure of support system at home.  Recommend rehab consult.  Feel pt will need close to 24 hour assist even after rehab. PT Recommendation/Assessment: Patient will need skilled PT in the acute care venue PT Problem List: Decreased strength;Decreased activity tolerance;Decreased balance;Decreased mobility;Decreased knowledge of use of DME;Decreased knowledge of precautions PT Therapy Diagnosis : Difficulty walking;Hemiplegia dominant side PT Plan PT Frequency: Min 4X/week PT Treatment/Interventions: Gait training;DME instruction;Functional mobility training;Therapeutic activities;Therapeutic exercise;Balance training;Patient/family education PT Recommendation Recommendations for Other Services: Rehab consult Follow Up Recommendations: Inpatient Rehab Equipment Recommended: Defer to next venue PT Goals  Acute Rehab PT Goals PT Goal Formulation: Patient unable to participate in goal setting Time For Goal Achievement: 7 days Pt will go Supine/Side to Sit: with supervision PT Goal: Supine/Side to Sit - Progress: Goal set today Pt will Sit at [facility] of Bed: with supervision;3-5 min PT Goal: Sit at Edge Of Bed - Progress: Goal set today Pt will go Sit to Supine/Side: with supervision PT Goal: Sit to Supine/Side - Progress: Goal set today Pt will go Sit to Stand: with min assist PT  Goal: Sit to Stand - Progress: Goal set today Pt will go Stand to Sit: with supervision PT Goal: Stand to Sit - Progress: Goal set today Pt will Transfer Bed to Chair/Chair to Bed: with min assist PT Transfer Goal: Bed to Chair/Chair to Bed - Progress: Goal set today Pt will Ambulate: 16 - 50 feet;with min assist PT Goal: Ambulate - Progress: Goal set today  PT Evaluation Precautions/Restrictions  Precautions Precautions: Fall Prior Functioning  Home Living Lives With: Spouse Additional Comments: Unable to assess due to pt ahasic and family not present. Prior Function Comments: Unable to assess due to pt aphasic and family not present. Cognition Cognition Arousal/Alertness: Awake/alert Following Commands: Follows one step commands inconsistently (Needed nonverbal cues to complete at times.) Cognition - Other Comments: Unable to fully assess due to aphasia. Sensation/Coordination Sensation Additional Comments: Unable to assess due to aphasia Extremity Assessment RLE Strength RLE Overall Strength Comments: grossly 3+/5 LLE Assessment LLE Assessment: Within Functional Limits Mobility (including Balance) Bed Mobility Bed Mobility: Yes Supine to Sit: 4: Min assist;HOB elevated (Comment degrees) (HOB 30 degrees.) Supine to Sit Details (indicate cue type and reason): Assist to bring trunk up. Sitting - Scoot to Edge of Bed: 4: Min assist Sitting - Scoot to [place] of Bed Details (indicate cue type and reason): assist for balance. Transfers Sit to Stand: 3: Mod assist;With upper extremity assist;From bed Sit to Stand Details (indicate cue type and reason): Assist for balance due to posterior and lt. lean. Stand to Sit: 4: Min assist;With upper extremity assist;With armrests;To chair/3-in-1 Stand to Sit Details: assist to control descent Stand Pivot Transfers: 3: Mod assist Stand Pivot Transfer Details (indicate cue type and reason): Pivoted to left.  Pt with posterior and left lean  and with shuffling pivotal steps.  Static Sitting Balance Static Sitting - Balance  Support: Left upper extremity supported;Feet supported Static Sitting - Level of Assistance: 4: Min assist (pt leaning posteriorly and lt) Static Sitting - Comment/# of Minutes: 10 minutes.  Pt able to sit with supervision at times.  When leaning posteriorly pt's feet losing contact with the floor. Dynamic Sitting Balance Dynamic Sitting - Balance Support: Left upper extremity supported;Feet supported Dynamic Sitting - Level of Assistance: 3: Mod assist Exercise    End of Session PT - End of Session Equipment Utilized During Treatment: Gait belt Activity Tolerance: Patient tolerated treatment well Patient left: in chair;with call bell in reach Nurse Communication: Mobility status for transfers General Behavior During Session: [facility] for tasks performed Patient Class: Inpatient [facility] 12/29/2011, 3:18 PM  [facility] PT [phone]

## 2011-12-29 NOTE — Progress Notes (Signed)
Attempted cognitive linguistic eval x2 pt with MD and wil PT. Will reattempt at next date. B onnie Keithen Capo, MA CCC-SLP [phone]

## 2011-12-29 NOTE — Plan of Care (Signed)
Problem: Phase II Progression Outcomes Goal: Tolerating diet Outcome: Not Progressing Pt for MBS, + dysphagia, continues NPO.

## 2011-12-29 NOTE — Progress Notes (Signed)
  Echocardiogram 2D Echocardiogram has been performed.  [name] [facility] 12/29/2011, 9:36 AM

## 2011-12-29 NOTE — Progress Notes (Signed)
SUBJECTIVE "OK".   1. Stroke     Past Medical History  Diagnosis Date  . Myocardial infarction    Current Facility-Administered Medications  Medication Dose Route Frequency Provider Last Rate Last Dose  . 0.9 %  sodium chloride infusion   Intravenous STAT Lyanne Co, MD 125 mL/hr at 12/28/11 1449    . 0.9 %  sodium chloride infusion   Intravenous Continuous Loie Jahr, MD 125 mL/hr at 12/29/11 0611 125 mL/hr at 12/29/11 0611  . acetaminophen (TYLENOL) tablet 650 mg  650 mg Oral Q4H PRN Nysha Koplin, MD       Or  . acetaminophen (TYLENOL) suppository 650 mg  650 mg Rectal Q4H PRN Deina Lipsey, MD      . aspirin suppository 300 mg  300 mg Rectal Daily Srinika Delone, MD       Or  . aspirin tablet 325 mg  325 mg Oral Daily Kinta Martis, MD      . enoxaparin (LOVENOX) injection 40 mg  40 mg Subcutaneous QHS Lelania Bia, MD   40 mg at 12/28/11 2120  . hydrALAZINE (APRESOLINE) injection 5 mg  5 mg Intravenous Q8H PRN Magen Suriano, MD      . insulin aspart (novoLOG) injection 0-9 Units  0-9 Units Subcutaneous Q4H Joahan Swatzell, MD   1 Units at 12/29/11 0924  . ondansetron (ZOFRAN) injection 4 mg  4 mg Intravenous Q6H PRN Audrea Bolte, MD      . pantoprazole (PROTONIX) injection 40 mg  40 mg Intravenous Q24H Glyndon Tursi, MD   40 mg at 12/28/11 2120  . senna-docusate (Senokot-S) tablet 1 tablet  1 tablet Oral QHS PRN Atharv Barriere, MD      . sodium chloride 0.9 % bolus 1,000 mL  1,000 mL Intravenous Once Lyanne Co, MD   1,000 mL at 12/28/11 1118  . sodium chloride 0.9 % bolus 1,000 mL  1,000 mL Intravenous Once Lyanne Co, MD   1,000 mL at 12/28/11 1237   No Known Allergies Active Problems:  CVA (cerebral infarction)  DM (diabetes mellitus), type 2, uncontrolled  HTN (hypertension), benign   Vital signs in last 24 hours: Temp:  [97.7 F (36.5 C)-98.2 F (36.8 C)] 98.1 F (36.7 C) (04/12 0630) Pulse Rate:  [55-114] 61  (04/12 0630) Resp:  [16-20] 18  (04/12  0630) BP: (77-133)/(48-79) 103/65 mmHg (04/12 0630) SpO2:  [96 %-100 %] 97 % (04/12 0630) Weight:  [68.04 kg (150 lb)] 68.04 kg (150 lb) (04/11 1238) Weight change:     Intake/Output from previous day: 04/11 0701 - 04/12 0700 In: 3260 [I.V.:3250; IV Piggyback:10] Out: 325 [Urine:325] Intake/Output this shift:    Lab Results:  Basename 12/29/11 0620 12/28/11 2010  WBC 6.4 7.1  HGB 12.6* 13.4  HCT 36.8* 39.7  PLT 157 185   BMET  Basename 12/29/11 0620 12/28/11 2010 12/28/11 1005  NA 137 -- 133*  K 3.8 -- 4.9  CL 106 -- 95*  CO2 22 -- 27  GLUCOSE 147* -- 508*  BUN 14 -- 20  CREATININE 0.88 0.80 --  CALCIUM 8.6 -- 10.5    Studies/Results: Dg Chest 2 View  12/28/2011  *RADIOLOGY REPORT*  Clinical Data: Fall, slurred speech.  CHEST - 2 VIEW  Comparison: None.  Findings: Prior CABG. Heart and mediastinal contours are within normal limits.  No focal opacities or effusions.  No acute bony abnormality.  IMPRESSION: No active cardiopulmonary disease.  Original Report Authenticated By: Cyndie Chime, M.D.  Ct Head Wo Contrast  12/28/2011  *RADIOLOGY REPORT*  Clinical Data:  Fall, trauma, seizure  CT HEAD WITHOUT CONTRAST CT CERVICAL SPINE WITHOUT CONTRAST  Technique:  Multidetector CT imaging of the head and cervical spine was performed following the standard protocol without intravenous contrast.  Multiplanar CT image reconstructions of the cervical spine were also generated.  Comparison:  None  CT HEAD  Findings: No intracranial hemorrhage.  No parenchymal contusion. No midline shift or mass effect.  Basilar cisterns are patent. No skull base fracture.  No fluid in the paranasal sinuses or mastoid air cells.  There is periventricular white matter hypodensities.  There is generalized cortical atrophy.  IMPRESSION: No intracranial trauma.  Atrophy and microvascular disease.  CT CERVICAL SPINE  Findings: There is reversal of normal cervical lordosis.  No prevertebral soft tissue  swelling.  The there is endplate osteophytosis joint space narrowing at C6-C7.  No loss vertebral body height q. leak.  Normal facet to dilation.  Normal precervical junction.  There is some stranding within the subcutaneous tissues posterior X of the occipital lobe and C2 vertebral body (image 30)  No epidural hematoma is evident.  IMPRESSION:  1.  No evidence of acute cervical spine fracture. 2.  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 3.  Subcutaneous stranding posterior to the C2 vertebral body may relate to trauma. 4. Straightening of the normal cervical lordosis may be secondary to position, muscle spasm, or ligamentous injury.  Original Report Authenticated By: Genevive Bi, M.D.   Ct Cervical Spine Wo Contrast  12/28/2011  *RADIOLOGY REPORT*  Clinical Data:  Fall, trauma, seizure  CT HEAD WITHOUT CONTRAST CT CERVICAL SPINE WITHOUT CONTRAST  Technique:  Multidetector CT imaging of the head and cervical spine was performed following the standard protocol without intravenous contrast.  Multiplanar CT image reconstructions of the cervical spine were also generated.  Comparison:  None  CT HEAD  Findings: No intracranial hemorrhage.  No parenchymal contusion. No midline shift or mass effect.  Basilar cisterns are patent. No skull base fracture.  No fluid in the paranasal sinuses or mastoid air cells.  There is periventricular white matter hypodensities.  There is generalized cortical atrophy.  IMPRESSION: No intracranial trauma.  Atrophy and microvascular disease.  CT CERVICAL SPINE  Findings: There is reversal of normal cervical lordosis.  No prevertebral soft tissue swelling.  The there is endplate osteophytosis joint space narrowing at C6-C7.  No loss vertebral body height q. leak.  Normal facet to dilation.  Normal precervical junction.  There is some stranding within the subcutaneous tissues posterior X of the occipital lobe and C2 vertebral body (image 30)  No epidural hematoma is evident.   IMPRESSION:  1.  No evidence of acute cervical spine fracture. 2.  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 3.  Subcutaneous stranding posterior to the C2 vertebral body may relate to trauma. 4. Straightening of the normal cervical lordosis may be secondary to position, muscle spasm, or ligamentous injury.  Original Report Authenticated By: Genevive Bi, M.D.    Medications: I have reviewed the patient's current medications.   Physical exam GENERAL- alert HEAD- normal atraumatic, no neck masses, normal thyroid, no jvd RESPIRATORY- appears well, vitals normal, no respiratory distress, acyanotic, normal RR, ear and throat exam is normal, neck free of mass or lymphadenopathy, chest clear, no wheezing, crepitations, rhonchi, normal symmetric air entry CVS- regular rate and rhythm, S1, S2 normal, no murmur, click, rub or gallop ABDOMEN- abdomen is soft without  significant tenderness, masses, organomegaly or guarding NEURO- power 2/5 right arm. Alert, slurred speech. EXTREMITIES- extremities normal, atraumatic, no cyanosis or edema  Plan .CVA (cerebral infarction)- Await stroke work up. Appreciate ST. Will consult neurology. Dr Lyman Speller will graciously see. .DM (diabetes mellitus), type 2, uncontrolled- BS better controlled. Follow hb a1c. Continue SSI. Marland KitchenHTN (hypertension), benign- hydralazine prn.  Dvt/gi prophylaxis.  Condition closely guarded. Will need snf.     Francena Zender 12/29/2011 10:18 AM Pager: [phone].

## 2011-12-29 NOTE — Progress Notes (Signed)
Utilization Review Completed.[name] T4/08/2012   

## 2011-12-29 NOTE — H&P (Signed)
[facility] CONSULT NOTE     Reason for Consult:stroke    HPI:    [name] is an 71 y.o. male [name] was transferred from [facility] after fall on the porch. History not clear. He was found to have dysarthric speech and right sided weakness. He was brought to ED  Past Medical History  Diagnosis Date  . Myocardial infarction     History reviewed. No pertinent past surgical history.  History reviewed. No pertinent family history.  Social History:  has an unknown smoking status. He does not have any smokeless tobacco history on file. He reports that he does not drink alcohol or use illicit drugs.  No Known Allergies  Medications:    Scheduled:   . sodium chloride   Intravenous STAT  . aspirin  300 mg Rectal Daily   Or  . aspirin  325 mg Oral Daily  . enoxaparin  40 mg Subcutaneous QHS  . ezetimibe-simvastatin  1 tablet Oral QHS  . insulin aspart  0-9 Units Subcutaneous Q4H  . pantoprazole (PROTONIX) IV  40 mg Intravenous Q24H    Review of Systems - unable  Blood pressure 93/56, pulse 55, temperature 97.8 F (36.6 C), temperature source Oral, resp. rate 18, height 5\' 3"  (1.6 m), weight 68.04 kg (150 lb), SpO2 98.00%.   Neurologic Examination:   Mental Status: Alert,  Speech evidence of expressive and receptive aphasia. Able to follow 3 step commands without difficulty. Cranial Nerves: II-Visual fields grossly intact. III/IV/VI-Extraocular movements intact.  Right hemianopsia, Pupils reactive bilaterally. V/VII-Smile asymmetric on the right VIII- intact but decreased over his right side IX/X-normal gag XI-bilateral shoulder shrug XII-midline tongue extension Motor:  Able to lift right arm off bed with drift but did not his bed.  Right leg was 5/5 and left leg and arm were 5/5. Sensory: Pinprick and light touch intact throughout, bilaterally Deep Tendon Reflexes: 2+ and symmetric throughout Plantars:  Downgoing left up going on the right Cerebellar: Normal finger-to-nose,  Lab Results  Component Value Date/Time   CHOL 172 12/29/2011  6:20 AM    Results for orders placed during the hospital encounter of 12/28/11 (from the past 48 hour(s))  CBC     Status: Normal   Collection Time   12/28/11 10:05 AM      Component Value Range Comment   WBC 6.3  4.0 - 10.5 (K/uL)    RBC 5.37  4.22 - 5.81 (MIL/uL)    Hemoglobin 14.7  13.0 - 17.0 (g/dL)    HCT 78.2  95.6 - 21.3 (%)    MCV 78.0  78.0 - 100.0 (fL)    MCH 27.4  26.0 - 34.0 (pg)    MCHC 35.1  30.0 - 36.0 (g/dL)    RDW 08.6  57.8 - 46.9 (%)    Platelets 184  150 - 400 (K/uL)   BASIC METABOLIC PANEL     Status: Abnormal   Collection Time   12/28/11 10:05 AM      Component Value Range Comment   Sodium 133 (*) 135 - 145 (mEq/L)    Potassium 4.9  3.5 - 5.1 (mEq/L)    Chloride 95 (*) 96 - 112 (mEq/L)    CO2 27  19 - 32 (mEq/L)    Glucose, Bld 508 (*) 70 - 99 (mg/dL)    BUN 20  6 -  23 (mg/dL)    Creatinine, Ser 1.61  0.50 - 1.35 (mg/dL)    Calcium 09.6  8.4 - 10.5 (mg/dL)    GFR calc non Af Amer 74 (*) >90 (mL/min)    GFR calc Af Amer 86 (*) >90 (mL/min)   ETHANOL     Status: Normal   Collection Time   12/28/11 10:05 AM      Component Value Range Comment   Alcohol, Ethyl (B) <11  0 - 11 (mg/dL)   PROTIME-INR     Status: Normal   Collection Time   12/28/11 10:05 AM      Component Value Range Comment   Prothrombin Time 12.2  11.6 - 15.2 (seconds)    INR 0.89  0.00 - 1.49    TROPONIN I     Status: Normal   Collection Time   12/28/11 10:05 AM      Component Value Range Comment   Troponin I <0.30  <0.30 (ng/mL)   GLUCOSE, CAPILLARY     Status: Abnormal   Collection Time   12/28/11  1:01 PM      Component Value Range Comment   Glucose-Capillary 379 (*) 70 - 99 (mg/dL)   GLUCOSE, CAPILLARY     Status: Abnormal   Collection Time   12/28/11  2:14 PM      Component Value Range Comment   Glucose-Capillary 359 (*) 70 - 99 (mg/dL)     GLUCOSE, CAPILLARY     Status: Abnormal   Collection Time   12/28/11  4:40 PM      Component Value Range Comment   Glucose-Capillary 281 (*) 70 - 99 (mg/dL)    Comment 1 Notify RN      Comment 2 Documented in Chart     CBC     Status: Normal   Collection Time   12/28/11  8:10 PM      Component Value Range Comment   WBC 7.1  4.0 - 10.5 (K/uL)    RBC 4.92  4.22 - 5.81 (MIL/uL)    Hemoglobin 13.4  13.0 - 17.0 (g/dL)    HCT 04.5  40.9 - 81.1 (%)    MCV 80.7  78.0 - 100.0 (fL)    MCH 27.2  26.0 - 34.0 (pg)    MCHC 33.8  30.0 - 36.0 (g/dL)    RDW 91.4  78.2 - 95.6 (%)    Platelets 185  150 - 400 (K/uL)   CREATININE, SERUM     Status: Abnormal   Collection Time   12/28/11  8:10 PM      Component Value Range Comment   Creatinine, Ser 0.80  0.50 - 1.35 (mg/dL)    GFR calc non Af Amer 88 (*) >90 (mL/min)    GFR calc Af Amer >90  >90 (mL/min)   GLUCOSE, CAPILLARY     Status: Abnormal   Collection Time   12/28/11  8:53 PM      Component Value Range Comment   Glucose-Capillary 264 (*) 70 - 99 (mg/dL)    Comment 1 Documented in Chart      Comment 2 Notify RN     GLUCOSE, CAPILLARY     Status: Normal   Collection Time   12/29/11 12:21 AM      Component Value Range Comment   Glucose-Capillary 93  70 - 99 (mg/dL)    Comment 1 Documented in Chart      Comment 2 Notify RN     GLUCOSE, CAPILLARY  Status: Abnormal   Collection Time   12/29/11  4:12 AM      Component Value Range Comment   Glucose-Capillary 184 (*) 70 - 99 (mg/dL)    Comment 1 Documented in Chart      Comment 2 Notify RN     TSH     Status: Normal   Collection Time   12/29/11  6:20 AM      Component Value Range Comment   TSH 1.374  0.350 - 4.500 (uIU/mL)   CBC     Status: Abnormal   Collection Time   12/29/11  6:20 AM      Component Value Range Comment   WBC 6.4  4.0 - 10.5 (K/uL)    RBC 4.60  4.22 - 5.81 (MIL/uL)    Hemoglobin 12.6 (*) 13.0 - 17.0 (g/dL)    HCT 16.1 (*) 09.6 - 52.0 (%)    MCV 80.0  78.0 - 100.0  (fL)    MCH 27.4  26.0 - 34.0 (pg)    MCHC 34.2  30.0 - 36.0 (g/dL)    RDW 04.5  40.9 - 81.1 (%)    Platelets 157  150 - 400 (K/uL)   COMPREHENSIVE METABOLIC PANEL     Status: Abnormal   Collection Time   12/29/11  6:20 AM      Component Value Range Comment   Sodium 137  135 - 145 (mEq/L)    Potassium 3.8  3.5 - 5.1 (mEq/L)    Chloride 106  96 - 112 (mEq/L)    CO2 22  19 - 32 (mEq/L)    Glucose, Bld 147 (*) 70 - 99 (mg/dL)    BUN 14  6 - 23 (mg/dL)    Creatinine, Ser 9.14  0.50 - 1.35 (mg/dL)    Calcium 8.6  8.4 - 10.5 (mg/dL)    Total Protein 5.4 (*) 6.0 - 8.3 (g/dL)    Albumin 2.5 (*) 3.5 - 5.2 (g/dL)    AST 11  0 - 37 (U/L)    ALT 7  0 - 53 (U/L)    Alkaline Phosphatase 75  39 - 117 (U/L)    Total Bilirubin 0.2 (*) 0.3 - 1.2 (mg/dL)    GFR calc non Af Amer 85 (*) >90 (mL/min)    GFR calc Af Amer >90  >90 (mL/min)   LIPID PANEL     Status: Abnormal   Collection Time   12/29/11  6:20 AM      Component Value Range Comment   Cholesterol 172  0 - 200 (mg/dL)    Triglycerides 782 (*) <150 (mg/dL)    HDL 38 (*) >95 (mg/dL)    Total CHOL/HDL Ratio 4.5      VLDL 52 (*) 0 - 40 (mg/dL)    LDL Cholesterol 82  0 - 99 (mg/dL)   GLUCOSE, CAPILLARY     Status: Abnormal   Collection Time   12/29/11  7:57 AM      Component Value Range Comment   Glucose-Capillary 147 (*) 70 - 99 (mg/dL)     Dg Chest 2 View  03/08/3085  *RADIOLOGY REPORT*  Clinical Data: Fall, slurred speech.  CHEST - 2 VIEW  Comparison: None.  Findings: Prior CABG. Heart and mediastinal contours are within normal limits.  No focal opacities or effusions.  No acute bony abnormality.  IMPRESSION: No active cardiopulmonary disease.  Original Report Authenticated By: Cyndie Chime, M.D.   Ct Head Wo Contrast  12/28/2011  *RADIOLOGY REPORT*  Clinical Data:  Fall, trauma, seizure  CT HEAD WITHOUT CONTRAST CT CERVICAL SPINE WITHOUT CONTRAST  Technique:  Multidetector CT imaging of the head and cervical spine was performed  following the standard protocol without intravenous contrast.  Multiplanar CT image reconstructions of the cervical spine were also generated.  Comparison:  None  CT HEAD  Findings: No intracranial hemorrhage.  No parenchymal contusion. No midline shift or mass effect.  Basilar cisterns are patent. No skull base fracture.  No fluid in the paranasal sinuses or mastoid air cells.  There is periventricular white matter hypodensities.  There is generalized cortical atrophy.  IMPRESSION: No intracranial trauma.  Atrophy and microvascular disease.  CT CERVICAL SPINE  Findings: There is reversal of normal cervical lordosis.  No prevertebral soft tissue swelling.  The there is endplate osteophytosis joint space narrowing at C6-C7.  No loss vertebral body height q. leak.  Normal facet to dilation.  Normal precervical junction.  There is some stranding within the subcutaneous tissues posterior X of the occipital lobe and C2 vertebral body (image 30)  No epidural hematoma is evident.  IMPRESSION:  1.  No evidence of acute cervical spine fracture. 2.  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 3.  Subcutaneous stranding posterior to the C2 vertebral body may relate to trauma. 4. Straightening of the normal cervical lordosis may be secondary to position, muscle spasm, or ligamentous injury.  Original Report Authenticated By: Genevive Bi, M.D.   Ct Cervical Spine Wo Contrast  12/28/2011  *RADIOLOGY REPORT*  Clinical Data:  Fall, trauma, seizure  CT HEAD WITHOUT CONTRAST CT CERVICAL SPINE WITHOUT CONTRAST  Technique:  Multidetector CT imaging of the head and cervical spine was performed following the standard protocol without intravenous contrast.  Multiplanar CT image reconstructions of the cervical spine were also generated.  Comparison:  None  CT HEAD  Findings: No intracranial hemorrhage.  No parenchymal contusion. No midline shift or mass effect.  Basilar cisterns are patent. No skull base fracture.  No fluid in  the paranasal sinuses or mastoid air cells.  There is periventricular white matter hypodensities.  There is generalized cortical atrophy.  IMPRESSION: No intracranial trauma.  Atrophy and microvascular disease.  CT CERVICAL SPINE  Findings: There is reversal of normal cervical lordosis.  No prevertebral soft tissue swelling.  The there is endplate osteophytosis joint space narrowing at C6-C7.  No loss vertebral body height q. leak.  Normal facet to dilation.  Normal precervical junction.  There is some stranding within the subcutaneous tissues posterior X of the occipital lobe and C2 vertebral body (image 30)  No epidural hematoma is evident.  IMPRESSION:  1.  No evidence of acute cervical spine fracture. 2.  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 3.  Subcutaneous stranding posterior to the C2 vertebral body may relate to trauma. 4. Straightening of the normal cervical lordosis may be secondary to position, muscle spasm, or ligamentous injury.  Original Report Authenticated By: Genevive Bi, M.D.   Mario Brain Wo Contrast  12/29/2011  *RADIOLOGY REPORT*  Clinical Data:  Stroke.  Right-sided weakness.  Aphasia  MRI HEAD WITHOUT CONTRAST MRA HEAD WITHOUT CONTRAST  Technique:  Multiplanar, multiecho pulse sequences of the brain and surrounding structures were obtained without intravenous contrast. Angiographic images of the head were obtained using MRA technique without contrast.  Comparison:  CT 12/28/2011  MRI HEAD  Findings:  Acute infarct in the left basal ganglia involving the head of the caudate, anterior limb internal capsule, and  putamen. Acute infarct extends into the left insular cortex. There is T1 shortening within the infarct suggesting mild hemorrhage.  No other acute infarct.  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  Mild chronic microvascular ischemic change in the white matter. Negative for mass lesion.  Ventricle size is normal.  No midline shift.  IMPRESSION: Acute  infarct in the left middle cerebral artery territory involving left basal ganglia and left insula.  This appears to be a slightly hemorrhagic infarct.  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  MRA HEAD  Findings: Both vertebral arteries are patent to the basilar. Basilar, superior cerebellar, and posterior cerebral arteries are patent bilaterally.  PICA is patent bilaterally.  Small right PICA is patent.  Left internal carotid artery is patent. There is very little signal in the left middle cerebral artery branches which is likely due to slow flow as well as occlusion of multiple branches.  Right internal carotid artery is widely patent.  Both anterior cerebral arteries are supplied from the right and are patent. Right middle cerebral artery is widely patent.  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  Original Report Authenticated By: Camelia Phenes, M.D.   Mario Mra Head/brain Wo Cm  12/29/2011  *RADIOLOGY REPORT*  Clinical Data:  Stroke.  Right-sided weakness.  Aphasia  MRI HEAD WITHOUT CONTRAST MRA HEAD WITHOUT CONTRAST  Technique:  Multiplanar, multiecho pulse sequences of the brain and surrounding structures were obtained without intravenous contrast. Angiographic images of the head were obtained using MRA technique without contrast.  Comparison:  CT 12/28/2011  MRI HEAD  Findings:  Acute infarct in the left basal ganglia involving the head of the caudate, anterior limb internal capsule, and putamen. Acute infarct extends into the left insular cortex. There is T1 shortening within the infarct suggesting mild hemorrhage.  No other acute infarct.  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  Mild chronic microvascular ischemic change in the white matter. Negative for mass lesion.  Ventricle size is normal.  No midline shift.  IMPRESSION: Acute infarct in the left middle cerebral artery territory involving left  basal ganglia and left insula.  This appears to be a slightly hemorrhagic infarct.  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  MRA HEAD  Findings: Both vertebral arteries are patent to the basilar. Basilar, superior cerebellar, and posterior cerebral arteries are patent bilaterally.  PICA is patent bilaterally.  Small right PICA is patent.  Left internal carotid artery is patent. There is very little signal in the left middle cerebral artery branches which is likely due to slow flow as well as occlusion of multiple branches.  Right internal carotid artery is widely patent.  Both anterior cerebral arteries are supplied from the right and are patent. Right middle cerebral artery is widely patent.  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  Original Report Authenticated By: Camelia Phenes, M.D.     Assessment/Plan:   71 YO male with new Acute infarct in the left middle cerebral artery territory involving left basal ganglia and left insula.  Recommend:   Plan: 1. HgbA1c, fasting lipid panel 2. MRA neck 3. PT consult, OT consult, Speech consult 4. Echocardiogram 6. Prophylactic therapy-Antiplatelet med: Plavix - dose 75 mg daily 7. Risk  Factor modification    Felicie Morn PA-C [facility] [phone]  12/29/2011,  1:57 PM

## 2011-12-29 NOTE — Progress Notes (Signed)
*  PRELIMINARY RESULTS* Vascular Ultrasound Carotid Duplex (Doppler) has been completed.   Right internal carotid artery demonstrates elevated velocities in the 60-79% range with no evidence of significant plaque formation. The right vertebral artery has elevated velocities with antegrade flow. The left internal carotid artery demonstrates high resistance waveforms with no plaque morphology. The left vertebral artery was not insonated. 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   Malachy Moan, RDMS, RDCS 12/29/2011, 1:34 PM

## 2011-12-29 NOTE — Progress Notes (Deleted)
SUBJECTIVE    1. Stroke     Past Medical History  Diagnosis Date  . Myocardial infarction    Current Facility-Administered Medications  Medication Dose Route Frequency Provider Last Rate Last Dose  . 0.9 %  sodium chloride infusion   Intravenous STAT Lyanne Co, MD 125 mL/hr at 12/28/11 1449    . 0.9 %  sodium chloride infusion   Intravenous Continuous Keeon Zurn, MD 125 mL/hr at 12/29/11 0611 125 mL/hr at 12/29/11 0611  . acetaminophen (TYLENOL) tablet 650 mg  650 mg Oral Q4H PRN Mario Coronado, MD       Or  . acetaminophen (TYLENOL) suppository 650 mg  650 mg Rectal Q4H PRN Jered Heiny, MD      . aspirin suppository 300 mg  300 mg Rectal Daily Seabron Iannello, MD       Or  . aspirin tablet 325 mg  325 mg Oral Daily Berma Harts, MD      . enoxaparin (LOVENOX) injection 40 mg  40 mg Subcutaneous QHS Hargun Spurling, MD   40 mg at 12/28/11 2120  . hydrALAZINE (APRESOLINE) injection 5 mg  5 mg Intravenous Q8H PRN Zuleica Seith, MD      . insulin aspart (novoLOG) injection 0-9 Units  0-9 Units Subcutaneous Q4H Ellamarie Naeve, MD   1 Units at 12/29/11 0924  . ondansetron (ZOFRAN) injection 4 mg  4 mg Intravenous Q6H PRN Dorothy Polhemus, MD      . pantoprazole (PROTONIX) injection 40 mg  40 mg Intravenous Q24H Dotty Gonzalo, MD   40 mg at 12/28/11 2120  . senna-docusate (Senokot-S) tablet 1 tablet  1 tablet Oral QHS PRN Illianna Paschal, MD      . sodium chloride 0.9 % bolus 1,000 mL  1,000 mL Intravenous Once Lyanne Co, MD   1,000 mL at 12/28/11 1118  . sodium chloride 0.9 % bolus 1,000 mL  1,000 mL Intravenous Once Lyanne Co, MD   1,000 mL at 12/28/11 1237   No Known Allergies Active Problems:  CVA (cerebral infarction)  DM (diabetes mellitus), type 2, uncontrolled  HTN (hypertension), benign   Vital signs in last 24 hours: Temp:  [97.7 F (36.5 C)-98.2 F (36.8 C)] 98.1 F (36.7 C) (04/12 0630) Pulse Rate:  [55-114] 61  (04/12 0630) Resp:  [16-20] 18  (04/12  0630) BP: (77-133)/(48-79) 103/65 mmHg (04/12 0630) SpO2:  [96 %-100 %] 97 % (04/12 0630) Weight:  [68.04 kg (150 lb)] 68.04 kg (150 lb) (04/11 1238) Weight change:     Intake/Output from previous day: 04/11 0701 - 04/12 0700 In: 3260 [I.V.:3250; IV Piggyback:10] Out: 325 [Urine:325] Intake/Output this shift:    Lab Results:  Basename 12/29/11 0620 12/28/11 2010  WBC 6.4 7.1  HGB 12.6* 13.4  HCT 36.8* 39.7  PLT 157 185   BMET  Basename 12/29/11 0620 12/28/11 2010 12/28/11 1005  NA 137 -- 133*  K 3.8 -- 4.9  CL 106 -- 95*  CO2 22 -- 27  GLUCOSE 147* -- 508*  BUN 14 -- 20  CREATININE 0.88 0.80 --  CALCIUM 8.6 -- 10.5    Studies/Results: Dg Chest 2 View  12/28/2011  *RADIOLOGY REPORT*  Clinical Data: Fall, slurred speech.  CHEST - 2 VIEW  Comparison: None.  Findings: Prior CABG. Heart and mediastinal contours are within normal limits.  No focal opacities or effusions.  No acute bony abnormality.  IMPRESSION: No active cardiopulmonary disease.  Original Report Authenticated By: Cyndie Chime,  M.D.   Ct Head Wo Contrast  12/28/2011  *RADIOLOGY REPORT*  Clinical Data:  Fall, trauma, seizure  CT HEAD WITHOUT CONTRAST CT CERVICAL SPINE WITHOUT CONTRAST  Technique:  Multidetector CT imaging of the head and cervical spine was performed following the standard protocol without intravenous contrast.  Multiplanar CT image reconstructions of the cervical spine were also generated.  Comparison:  None  CT HEAD  Findings: No intracranial hemorrhage.  No parenchymal contusion. No midline shift or mass effect.  Basilar cisterns are patent. No skull base fracture.  No fluid in the paranasal sinuses or mastoid air cells.  There is periventricular white matter hypodensities.  There is generalized cortical atrophy.  IMPRESSION: No intracranial trauma.  Atrophy and microvascular disease.  CT CERVICAL SPINE  Findings: There is reversal of normal cervical lordosis.  No prevertebral soft tissue  swelling.  The there is endplate osteophytosis joint space narrowing at C6-C7.  No loss vertebral body height q. leak.  Normal facet to dilation.  Normal precervical junction.  There is some stranding within the subcutaneous tissues posterior X of the occipital lobe and C2 vertebral body (image 30)  No epidural hematoma is evident.  IMPRESSION:  1.  No evidence of acute cervical spine fracture. 2.  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 3.  Subcutaneous stranding posterior to the C2 vertebral body may relate to trauma. 4. Straightening of the normal cervical lordosis may be secondary to position, muscle spasm, or ligamentous injury.  Original Report Authenticated By: Genevive Bi, M.D.   Ct Cervical Spine Wo Contrast  12/28/2011  *RADIOLOGY REPORT*  Clinical Data:  Fall, trauma, seizure  CT HEAD WITHOUT CONTRAST CT CERVICAL SPINE WITHOUT CONTRAST  Technique:  Multidetector CT imaging of the head and cervical spine was performed following the standard protocol without intravenous contrast.  Multiplanar CT image reconstructions of the cervical spine were also generated.  Comparison:  None  CT HEAD  Findings: No intracranial hemorrhage.  No parenchymal contusion. No midline shift or mass effect.  Basilar cisterns are patent. No skull base fracture.  No fluid in the paranasal sinuses or mastoid air cells.  There is periventricular white matter hypodensities.  There is generalized cortical atrophy.  IMPRESSION: No intracranial trauma.  Atrophy and microvascular disease.  CT CERVICAL SPINE  Findings: There is reversal of normal cervical lordosis.  No prevertebral soft tissue swelling.  The there is endplate osteophytosis joint space narrowing at C6-C7.  No loss vertebral body height q. leak.  Normal facet to dilation.  Normal precervical junction.  There is some stranding within the subcutaneous tissues posterior X of the occipital lobe and C2 vertebral body (image 30)  No epidural hematoma is evident.   IMPRESSION:  1.  No evidence of acute cervical spine fracture. 2.  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 3.  Subcutaneous stranding posterior to the C2 vertebral body may relate to trauma. 4. Straightening of the normal cervical lordosis may be secondary to position, muscle spasm, or ligamentous injury.  Original Report Authenticated By: Genevive Bi, M.D.    Medications: I have reviewed the patient's current medications.   Physical exam GENERAL- alert HEAD- normal atraumatic, no neck masses, normal thyroid, no jvd RESPIRATORY- appears well, vitals normal, no respiratory distress, acyanotic, normal RR, ear and throat exam is normal, neck free of mass or lymphadenopathy, chest clear, no wheezing, crepitations, rhonchi, normal symmetric air entry CVS- regular rate and rhythm, S1, S2 normal, no murmur, click, rub or gallop ABDOMEN- abdomen  is soft without significant tenderness, masses, organomegaly or guarding NEURO- Grossly normal EXTREMITIES- extremities normal, atraumatic, no cyanosis or edema  Plan  Active Problems:  CVA (cerebral infarction)  DM (diabetes mellitus), type 2, uncontrolled  HTN (hypertension), benign    Haeven Nickle 12/29/2011 9:57 AM Pager: [phone].

## 2011-12-30 ENCOUNTER — Inpatient Hospital Stay (HOSPITAL_COMMUNITY): Payer: Medicare Other

## 2011-12-30 LAB — LIPID PANEL
HDL: 42 mg/dL (ref >39)
LDL Cholesterol: 85 mg/dL (ref 0–99)
Triglycerides: 235 mg/dL — ABNORMAL HIGH (ref <150)

## 2011-12-30 LAB — GLUCOSE, CAPILLARY
Glucose-Capillary: 150 mg/dL — ABNORMAL HIGH (ref 70–99)
Glucose-Capillary: 170 mg/dL — ABNORMAL HIGH (ref 70–99)

## 2011-12-30 MED ORDER — INSULIN GLARGINE 100 UNIT/ML ~~LOC~~ SOLN
4.0000 [IU] | Freq: Every day | SUBCUTANEOUS | Status: DC
  Administered 2011-12-30: 4 [IU] via SUBCUTANEOUS

## 2011-12-30 MED ORDER — INSULIN ASPART 100 UNIT/ML ~~LOC~~ SOLN
0.0000 [IU] | Freq: Three times a day (TID) | SUBCUTANEOUS | Status: DC
  Administered 2011-12-30: 3 [IU] via SUBCUTANEOUS
  Administered 2011-12-30 – 2011-12-31 (×2): 5 [IU] via SUBCUTANEOUS
  Administered 2012-01-01: 2 [IU] via SUBCUTANEOUS
  Administered 2012-01-01 (×2): 3 [IU] via SUBCUTANEOUS
  Administered 2012-01-02: 2 [IU] via SUBCUTANEOUS
  Administered 2012-01-02 (×2): 3 [IU] via SUBCUTANEOUS
  Administered 2012-01-03: 2 [IU] via SUBCUTANEOUS
  Administered 2012-01-03: 3 [IU] via SUBCUTANEOUS

## 2011-12-30 MED ORDER — ENOXAPARIN SODIUM 40 MG/0.4ML ~~LOC~~ SOLN
40.0000 mg | SUBCUTANEOUS | Status: DC
  Filled 2011-12-30: qty 0.4

## 2011-12-30 MED ORDER — IRBESARTAN 75 MG PO TABS
75.0000 mg | ORAL_TABLET | Freq: Every day | ORAL | Status: DC
  Filled 2011-12-30 (×3): qty 1

## 2011-12-30 NOTE — Evaluation (Signed)
Speech Language Pathology Evaluation Patient Details Name: [name] MRN: [id number] DOB: 1940-11-29 Today's Date: 12/30/2011  Problem List:  Patient Active Problem List  Diagnoses  . CVA (cerebral infarction)  . DM (diabetes mellitus), type 2, uncontrolled  . HTN (hypertension), benign   Past Medical History:  Past Medical History  Diagnosis Date  . Myocardial infarction    Past Surgical History: History reviewed. No pertinent past surgical history.  SLP Assessment/Plan/Recommendation Assessment: Patient referred for Cognitive-Linguistic Evaluation per stroke protocol.  Clinical Impression Statement: Severe expressive aphasia  with decreased verbal and nonverbal agility  Verbal attempts, during repetition of single words and during  confrontational and responsive naming  tasks,  mostly perseverative  neologistic paraphasias.  Limb apraxia present as patient unable to make  "thumbs up" sign or okay sign on command even with max tactile and visual cues but was able to "wave goodbye" appropriately at end of evaluation.  Auditory comprehension intact  for basic yes/no questions  and simple one step verbal commands. Cognition evaluation limited secondary to expressive language deficits.   Initiate skilled ST treatment to improve basic communication skills to increase ability to communicate wants/needs and increase ability to participate in ADL's.    Please note that patient is illiterate and only able to write his name at baseline.                                          SLP Recommendation/Assessment: Patient will need skilled Speech Lanaguage Pathology Services in the acute care venue to address identified deficits Problem List: Auditory comprehension;Verbal expression;Orientation;Problem Solving Therapy Diagnosis: Aphasia;Apraxia Type of Aphasia: Broca's (expressive) Plan Speech Therapy Frequency: min 2x/week Duration: 2 weeks Treatment/Interventions: SLP instruction and  feedback;Compensatory strategies;Patient/family education Potential to Achieve Goals: Good SLP Recommendations Recommendations for Other Services: Rehab consult Follow up Recommendations: Skilled Nursing facility Equipment Recommended: Defer to next venue Individuals Consulted Consulted and Agree with Results and Recommendations: Patient;Family member/caregiver  SLP Goals  SLP Goals Potential to Achieve Goals: Good Progress/Goals/Alternative treatment plan discussed with pt/caregiver and they: Agree SLP Goal #1: Patient will produce automatic speech acts in unison using MIT with moderate assist.  SLP Goal #2: Patient will repeat imperative phrases  x5 with with 80% intelligibility with moderate assist.  SLP Goal #3: Patient will demonstrate correct use of simple objects used in ADL's through gestures x5 with minimal tactile and verbal cues.  SLP Goal #4: Patient will complete simple problem solving tasks ( use of call light) with max assist.   SLP Evaluation Prior Functioning Cognitive/Linguistic Baseline: Within functional limits Type of Home: House Lives With: Spouse Available Help at Discharge: Family Education: 7 th grade education per daughter . Illiterate baseline Vocation: Retired  IT consultant Overall Cognitive Status: Impaired Arousal/Alertness: Awake/alert Orientation Level: Oriented to person;Disoriented to place;Disoriented to time;Oriented to place Attention: Focused;Sustained Focused Attention: Appears intact Sustained Attention: Impaired Sustained Attention Impairment: Verbal basic;Functional basic Awareness: Impaired Awareness Impairment: Intellectual impairment Problem Solving: Impaired Problem Solving Impairment: Verbal basic Behaviors: Restless;Perseveration Safety/Judgment: Impaired  Comprehension Auditory Comprehension Overall Auditory Comprehension: Impaired Yes/No Questions: Within Functional Limits Commands: Impaired One Step Basic Commands: 75-100%  accurate Two Step Basic Commands: 25-49% accurate Interfering Components: Visual impairments;Processing speed;Motor planning EffectiveTechniques: Extra processing time;Pausing;Repetition Visual Recognition/Discrimination Discrimination: Not tested Reading Comprehension Reading Status: Not tested  Expression Expression Primary Mode of Expression: Nonverbal - gestures Verbal Expression Overall Verbal Expression:  Impaired Initiation: Impaired Automatic Speech: Day of week;Name Level of Generative/Spontaneous Verbalization: Word Repetition: Impaired Level of Impairment: Word level Naming: Impairment Responsive: 26-50% accurate Confrontation: Not tested Verbal Errors: Semantic paraphasias;Perseveration Pragmatics: No impairment Interfering Components: Speech intelligibility Effective Techniques: Articulatory cues;Melodic intonation;Open ended questions Written Expression Dominant Hand: Right Written Expression: Not tested  Oral/Motor Oral Motor/Sensory Function Overall Oral Motor/Sensory Function: Impaired Labial ROM: Reduced right Labial Symmetry: Abnormal symmetry right Labial Strength: Reduced Labial Sensation: Reduced Lingual ROM: Reduced right Lingual Symmetry: Abnormal symmetry right Lingual Strength: Reduced Lingual Sensation: Reduced Facial ROM: Reduced right Facial Symmetry: Right droop Facial Strength: Reduced Facial Sensation: Reduced Velum: Impaired right Mandible:  (DNT) Motor Speech Overall Motor Speech: Impaired Respiration: Within functional limits Resonance: Hypernasality Articulation: Impaired Level of Impairment: Word Intelligibility: Intelligibility reduced Word: 0-24% accurate Phrase: 0-24% accurate Sentence: Not tested Motor Planning: Impaired Level of Impairment: Word Motor Speech Errors: Groping for words Effective Techniques: Increased vocal intensity  [facility] 12/30/2011, 4:35 PM

## 2011-12-30 NOTE — Progress Notes (Signed)
Speech Language Pathology Dysphagia Treatment  Patient Details Name: [name] MRN: [id number] DOB: [birth year] Today's Date: 12/30/2011  SLP Assessment/Plan/Recommendation Patient seen for diagnostic treatment for diet tolerance of dysphagia 1 (puree) and honey thick liquids following initial MBS completed on 12/29/11. Patient observed with honey thick liquids by cup 4oz administered by patient . Moderate tactile cues required for patient to maintain neutral head position.  Max verbal/tactile cues for patient to take small sips.   No outward s/s of aspiration noted s/p trials.  RN notified of recommendations to continue current diet with full supervision and that patient's tongue was noted to be coated during completion of oral care by treating SLP.  ST to follow in acute care for possible diet advancement.                                                   Oral Cavity - Oral Hygiene Tongue coated   Moreen Fowler MS, CCC-SLP [id number] [facility] 12/30/2011, 4:51 PM

## 2011-12-30 NOTE — Progress Notes (Signed)
Appreciate neurology. Noted MRA findings.  SUBJECTIVE Patient has infiltrated iv right arm. Gobbled speech, difficult to understand.   1. Stroke     Past Medical History  Diagnosis Date  . Myocardial infarction    Current Facility-Administered Medications  Medication Dose Route Frequency Provider Last Rate Last Dose  . acetaminophen (TYLENOL) tablet 650 mg  650 mg Oral Q4H PRN Serah Nicoletti, MD       Or  . acetaminophen (TYLENOL) suppository 650 mg  650 mg Rectal Q4H PRN Lashuna Tamashiro, MD      . aspirin suppository 300 mg  300 mg Rectal Daily Cecia Egge, MD       Or  . aspirin tablet 325 mg  325 mg Oral Daily Jorel Gravlin, MD   325 mg at 12/29/11 1416  . enoxaparin (LOVENOX) injection 40 mg  40 mg Subcutaneous QHS Ebrima Ranta, MD   40 mg at 12/29/11 2250  . ezetimibe-simvastatin (VYTORIN) 10-40 MG per tablet 1 tablet  1 tablet Oral QHS Yaneth Fairbairn, MD   1 tablet at 12/29/11 2250  . food thickener (THICK IT) powder   Oral PRN Maddoxx Burkitt, MD      . hydrALAZINE (APRESOLINE) injection 5 mg  5 mg Intravenous Q8H PRN Kaitrin Seybold, MD      . insulin aspart (novoLOG) injection 0-9 Units  0-9 Units Subcutaneous Q4H Advaith Lamarque, MD   2 Units at 12/30/11 0430  . ondansetron (ZOFRAN) injection 4 mg  4 mg Intravenous Q6H PRN Faithlyn Recktenwald, MD      . pantoprazole (PROTONIX) EC tablet 40 mg  40 mg Oral QHS Herby Abraham, PHARMD   40 mg at 12/29/11 2250  . senna-docusate (Senokot-S) tablet 1 tablet  1 tablet Oral QHS PRN Sriman Tally, MD      . DISCONTD: 0.9 %  sodium chloride infusion   Intravenous Continuous Skilynn Durney, MD 50 mL/hr at 12/29/11 1601    . DISCONTD: pantoprazole (PROTONIX) injection 40 mg  40 mg Intravenous Q24H Sinai Illingworth, MD   40 mg at 12/28/11 2120   No Known Allergies Active Problems:  CVA (cerebral infarction)  DM (diabetes mellitus), type 2, uncontrolled  HTN (hypertension), benign   Vital signs in last 24 hours: Temp:  [97.8 F (36.6 C)-98.8 F  (37.1 C)] 98.5 F (36.9 C) (04/13 0606) Pulse Rate:  [55-82] 64  (04/13 0606) Resp:  [18-20] 20  (04/13 0606) BP: (93-135)/(56-69) 135/67 mmHg (04/13 0606) SpO2:  [93 %-98 %] 94 % (04/13 0606) Weight change:     Intake/Output from previous day: 04/12 0701 - 04/13 0700 In: 1560 [P.O.:240; I.V.:1320] Out: 1250 [Urine:1250] Intake/Output this shift: Total I/O In: 120 [P.O.:120] Out: -   Lab Results:  Basename 12/29/11 0620 12/28/11 2010  WBC 6.4 7.1  HGB 12.6* 13.4  HCT 36.8* 39.7  PLT 157 185   BMET  Basename 12/29/11 1437 12/29/11 0620  NA 136 137  K 4.4 3.8  CL 104 106  CO2 23 22  GLUCOSE 256* 147*  BUN 11 14  CREATININE 0.80 0.88  CALCIUM 8.9 8.6    Studies/Results: Dg Chest 2 View  12/28/2011  *RADIOLOGY REPORT*  Clinical Data: Fall, slurred speech.  CHEST - 2 VIEW  Comparison: None.  Findings: Prior CABG. Heart and mediastinal contours are within normal limits.  No focal opacities or effusions.  No acute bony abnormality.  IMPRESSION: No active cardiopulmonary disease.  Original Report Authenticated By: Cyndie Chime, M.D.   Ct Head Wo  Contrast  12/28/2011  *RADIOLOGY REPORT*  Clinical Data:  Fall, trauma, seizure  CT HEAD WITHOUT CONTRAST CT CERVICAL SPINE WITHOUT CONTRAST  Technique:  Multidetector CT imaging of the head and cervical spine was performed following the standard protocol without intravenous contrast.  Multiplanar CT image reconstructions of the cervical spine were also generated.  Comparison:  None  CT HEAD  Findings: No intracranial hemorrhage.  No parenchymal contusion. No midline shift or mass effect.  Basilar cisterns are patent. No skull base fracture.  No fluid in the paranasal sinuses or mastoid air cells.  There is periventricular white matter hypodensities.  There is generalized cortical atrophy.  IMPRESSION: No intracranial trauma.  Atrophy and microvascular disease.  CT CERVICAL SPINE  Findings: There is reversal of normal cervical lordosis.   No prevertebral soft tissue swelling.  The there is endplate osteophytosis joint space narrowing at C6-C7.  No loss vertebral body height q. leak.  Normal facet to dilation.  Normal precervical junction.  There is some stranding within the subcutaneous tissues posterior X of the occipital lobe and C2 vertebral body (image 30)  No epidural hematoma is evident.  IMPRESSION:  1.  No evidence of acute cervical spine fracture. 2.  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 3.  Subcutaneous stranding posterior to the C2 vertebral body may relate to trauma. 4. Straightening of the normal cervical lordosis may be secondary to position, muscle spasm, or ligamentous injury.  Original Report Authenticated By: Genevive Bi, M.D.   Ct Cervical Spine Wo Contrast  12/28/2011  *RADIOLOGY REPORT*  Clinical Data:  Fall, trauma, seizure  CT HEAD WITHOUT CONTRAST CT CERVICAL SPINE WITHOUT CONTRAST  Technique:  Multidetector CT imaging of the head and cervical spine was performed following the standard protocol without intravenous contrast.  Multiplanar CT image reconstructions of the cervical spine were also generated.  Comparison:  None  CT HEAD  Findings: No intracranial hemorrhage.  No parenchymal contusion. No midline shift or mass effect.  Basilar cisterns are patent. No skull base fracture.  No fluid in the paranasal sinuses or mastoid air cells.  There is periventricular white matter hypodensities.  There is generalized cortical atrophy.  IMPRESSION: No intracranial trauma.  Atrophy and microvascular disease.  CT CERVICAL SPINE  Findings: There is reversal of normal cervical lordosis.  No prevertebral soft tissue swelling.  The there is endplate osteophytosis joint space narrowing at C6-C7.  No loss vertebral body height q. leak.  Normal facet to dilation.  Normal precervical junction.  There is some stranding within the subcutaneous tissues posterior X of the occipital lobe and C2 vertebral body (image 30)  No  epidural hematoma is evident.  IMPRESSION:  1.  No evidence of acute cervical spine fracture. 2.  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 3.  Subcutaneous stranding posterior to the C2 vertebral body may relate to trauma. 4. Straightening of the normal cervical lordosis may be secondary to position, muscle spasm, or ligamentous injury.  Original Report Authenticated By: Genevive Bi, M.D.   Mr Angiogram Neck Wo Contrast  12/30/2011  *RADIOLOGY REPORT*  Clinical Data:  Left basal ganglia infarct.  Contrast was not given as there was extravasation during the intent at the junction.  A second injection was not attempted.  MRA NECK WITHOUT CONTRAST  Technique:  Angiographic images of the neck were obtained using MRA technique without intravenous contrast.  Carotid stenosis measurements (when applicable) are obtained utilizing NASCET criteria, using the distal internal carotid diameter as the  denominator.  Comparison:  MRA head 12/29/2011.  Findings:  The arch is not evaluated.  Flow is antegrade in the vertebral arteries bilaterally.  The right vertebral artery is dominant to the left.  There is some signal loss, suggesting stenosis, in the proximal right vertebral artery. The left vertebral artery origin appears to be intact.  No other significant stenoses are present.  The right common carotid artery is within normal limits.  The proximal right internal carotid artery demonstrates mild irregularity without significant stenosis relative to the distal vessel.  The left common carotid artery demonstrates some distal irregularity without significant stenosis.  There is no significant flow within the left internal carotid artery.  IMPRESSION:  1.  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 2.  Probable focal stenosis of the proximal right vertebral artery, the dominant vessel. 3.  Mild irregularity of the proximal right internal carotid artery without significant stenosis relative to the  distal vessel.  Original Report Authenticated By: Jamesetta Orleans. MATTERN, M.D.   Mr Brain Wo Contrast  12/29/2011  *RADIOLOGY REPORT*  Clinical Data:  Stroke.  Right-sided weakness.  Aphasia  MRI HEAD WITHOUT CONTRAST MRA HEAD WITHOUT CONTRAST  Technique:  Multiplanar, multiecho pulse sequences of the brain and surrounding structures were obtained without intravenous contrast. Angiographic images of the head were obtained using MRA technique without contrast.  Comparison:  CT 12/28/2011  MRI HEAD  Findings:  Acute infarct in the left basal ganglia involving the head of the caudate, anterior limb internal capsule, and putamen. Acute infarct extends into the left insular cortex. There is T1 shortening within the infarct suggesting mild hemorrhage.  No other acute infarct.  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  Mild chronic microvascular ischemic change in the white matter. Negative for mass lesion.  Ventricle size is normal.  No midline shift.  IMPRESSION: Acute infarct in the left middle cerebral artery territory involving left basal ganglia and left insula.  This appears to be a slightly hemorrhagic infarct.  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  MRA HEAD  Findings: Both vertebral arteries are patent to the basilar. Basilar, superior cerebellar, and posterior cerebral arteries are patent bilaterally.  PICA is patent bilaterally.  Small right PICA is patent.  Left internal carotid artery is patent. There is very little signal in the left middle cerebral artery branches which is likely due to slow flow as well as occlusion of multiple branches.  Right internal carotid artery is widely patent.  Both anterior cerebral arteries are supplied from the right and are patent. Right middle cerebral artery is widely patent.  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  Original Report Authenticated By: Camelia Phenes, M.D.   Mr Mra Head/brain Wo Cm  12/29/2011  *RADIOLOGY REPORT*  Clinical Data:  Stroke.  Right-sided weakness.  Aphasia  MRI HEAD WITHOUT CONTRAST MRA HEAD WITHOUT CONTRAST  Technique:  Multiplanar, multiecho pulse sequences of the brain and surrounding structures were obtained without intravenous contrast. Angiographic images of the head were obtained using MRA technique without contrast.  Comparison:  CT 12/28/2011  MRI HEAD  Findings:  Acute infarct in the left basal ganglia involving the head of the caudate, anterior limb internal capsule, and putamen. Acute infarct extends into the left insular cortex. There is T1 shortening within the infarct suggesting mild hemorrhage.  No other acute infarct.  There is occlusion of  the left internal carotid artery.  Mild chronic microvascular ischemic change in the white matter. Negative for mass lesion.  Ventricle size is normal.  No midline shift.  IMPRESSION: Acute infarct in the left middle cerebral artery territory involving left basal ganglia and left insula.  This appears to be a slightly hemorrhagic infarct.  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  MRA HEAD  Findings: Both vertebral arteries are patent to the basilar. Basilar, superior cerebellar, and posterior cerebral arteries are patent bilaterally.  PICA is patent bilaterally.  Small right PICA is patent.  Left internal carotid artery is patent. There is very little signal in the left middle cerebral artery branches which is likely due to slow flow as well as occlusion of multiple branches.  Right internal carotid artery is widely patent.  Both anterior cerebral arteries are supplied from the right and are patent. Right middle cerebral artery is widely patent.  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  Original Report Authenticated By: Camelia Phenes, M.D.    Medications: I have reviewed the patient's current  medications.   Physical exam GENERAL- alert HEAD- normal atraumatic, no neck masses, normal thyroid, no jvd RESPIRATORY- appears well, vitals normal, no respiratory distress, acyanotic, normal RR, ear and throat exam is normal, neck free of mass or lymphadenopathy, chest clear, no wheezing, crepitations, rhonchi, normal symmetric air entry CVS- regular rate and rhythm, S1, S2 normal, no murmur, click, rub or gallop ABDOMEN- abdomen is soft without significant tenderness, masses, organomegaly or guarding NEURO- right sided weakness. Gobbled speech. EXTREMITIES- extremities normal, atraumatic, no cyanosis or edema  Plan .CVA (Acute cerebral infarction in the left middle cerebral artery territory involving left basal ganglia and left insula- slightly hemorrhagic) in setting of occluded ICA- Continue current mx. Follow neuro recommendations. .DM (diabetes mellitus), type 2, uncontrolled- BS better controlled. hb a1c 14.4. Continue SSI. Add low dose lantus. Hyperlipidemia- on vytorin, continue. Marland KitchenHTN (hypertension), benign-Resume arb,  hydralazine prn. Infiltrated iv- elevate arm, remove and replace iv in another site.  Will need snf. Will try contact family members today. Dvt/gi prophylaxis.  Condition closely guarded.     Tyteanna Ost 12/30/2011 9:38 AM Pager: [phone].

## 2011-12-30 NOTE — Progress Notes (Signed)
Pharmacy: lovenox for VTE prophylaxis  -Wt= 68kg -SCr= 0.8, CrCl~ 70   A/P- -71 yo male with CVA to start heparin for DVT prophylaxis (patient was on this previously in the evening). -Will resume lovenox 40mg  sq q24hr and sign off for now -Please contact pharmacy for other needs.  Thank you, Harland German, Pharm D 12/30/2011 11:51 AM

## 2011-12-30 NOTE — Progress Notes (Signed)
History: Mr Hyndman was transferred from [facility] after fall on the porch. History not clear. He was found to have dysarthric speech and right sided weakness. Per EMS report, this is baseline.  CT brain did not show a bleed. As he was discovered on the porch in the morning, with him last said to be normal last night, he was outside tpa window, hence code stroke not called at med center. Patient attempts to speak but he is dysarthric and difficult to understand but follows instructions.   LSN:bed time 4/10/123 tPA Given: no, outside window Interval: Baseline (04/11 1008) Level of Consciousness (1a. ): Alert, keenly responsive (04/12 2030) LOC Questions (1b. ): Answers one question correctly (04/12 2030) LOC Commands (1c. ): Performs both tasks correctly (04/12 2030) Best Gaze (2. ): Normal (04/12 2030) Visual (3. ): No visual loss (04/12 2030) Facial Palsy (4. ): Minor paralysis (04/12 2030) Motor Arm, Left (5a. ): No drift (04/12 2030) Motor Arm, Right (5b. ): Some effort against gravity (04/12 2030) Motor Leg, Left (6a. ): No drift (04/12 2030) Motor Leg, Right (6b. ): Drift (04/12 2030) Limb Ataxia (7. ): Absent (04/12 2030) Sensory (8. ): Normal, no sensory loss (04/12 2030) Best Language (9. ): Severe aphasia (04/12 2030) Dysarthria (10. ): Mild-to-moderate dysarthria, patient slurs at least some words and, at worst, can be understood with some difficulty (04/12 2030) Inattention/Extinction: No Abnormality (04/12 2030) Total: 8  (04/12 2030)  Subjective: Alert, sitting up in bed.  Nonsensical speech.  Denies pain.  Objective: BP 95/60  Pulse 65  Temp(Src) 99.5 F (37.5 C) (Oral)  Resp 20  Ht 5\' 3"  (1.6 m)  Wt 68.04 kg (150 lb)  BMI 26.57 kg/m2  SpO2 96%  CBGs  Basename 12/30/11 0740 12/30/11 0423 12/29/11 2250 12/29/11 1628 12/29/11 1410 12/29/11 0757 12/29/11 0412 12/29/11 0021 12/28/11 2053 12/28/11 1640  GLUCAP 170* 150* 242* 230* 247* 147* 184* 93 264*  281*    Diet: D1, honey thick  Activity: up with assistance  DVT Prophylaxis:    Medications: Scheduled:   . aspirin  300 mg Rectal Daily   Or  . aspirin  325 mg Oral Daily  . ezetimibe-simvastatin  1 tablet Oral QHS  . insulin aspart  0-9 Units Subcutaneous TID WC  . insulin glargine  4 Units Subcutaneous Daily  . irbesartan  75 mg Oral Daily  . pantoprazole  40 mg Oral QHS  . DISCONTD: enoxaparin  40 mg Subcutaneous QHS  . DISCONTD: insulin aspart  0-9 Units Subcutaneous Q4H  . DISCONTD: pantoprazole (PROTONIX) IV  40 mg Intravenous Q24H    Neurologic Exam: Mental Status: Alert.  Attempts to conversate, but speech nonsensical.  Able to follow 3 step commands without difficulty. Cranial Nerves: II- Visual fields grossly intact. III/IV/VI-Extraocular movements intact.  Pupils reactive bilaterally. V/VII-Smile symmetric VIII-hearing grossly intact IX/X-normal gag XI-bilateral shoulder shrug XII-midline tongue extension Motor: 5/5 LUE/ LLE.  with normal tone and bulk RUE weak,  Sensory: Light touch intact throughout, bilaterally Deep Tendon Reflexes: 2+ and symmetric throughout Plantars: upgoing on right, downgoing on left. Cerebellar: Normal finger-to-nose, normal rapid alternating movements normal on left.   Lab Results: Basic Metabolic Panel:  Lab 12/29/11 5409 12/29/11 0620  NA 136 137  K 4.4 3.8  CL 104 106  CO2 23 22  GLUCOSE 256* 147*  BUN 11 14  CREATININE 0.80 0.88  CALCIUM 8.9 8.6  MG -- --  PHOS -- --   Liver Function  Tests:  Lab 12/29/11 1437 12/29/11 0620  AST 19 11  ALT 8 7  ALKPHOS 83 75  BILITOT 0.3 0.2*  PROT 6.1 5.4*  ALBUMIN 2.9* 2.5*   CBC:  Lab 12/29/11 0620 12/28/11 2010  WBC 6.4 7.1  NEUTROABS -- --  HGB 12.6* 13.4  HCT 36.8* 39.7  MCV 80.0 80.7  PLT 157 185   Cardiac Enzymes:  Lab 12/28/11 1005  CKTOTAL --  CKMB --  CKMBINDEX --  TROPONINI <0.30   CBG:  Lab 12/30/11 0740 12/30/11 0423 12/29/11 2250 12/29/11  1628 12/29/11 1410 12/29/11 0757  GLUCAP 170* 150* 242* 230* 247* 147*   Hemoglobin A1C:  Lab 12/29/11 1437  HGBA1C 14.4*   Fasting Lipid Panel:  Lab 12/30/11 0738  CHOL 174  HDL 42  LDLCALC 85  TRIG 235*  CHOLHDL 4.1  LDLDIRECT --   Thyroid Function Tests:  Lab 12/29/11 0620  TSH 1.374  T4TOTAL --  FREET4 --  T3FREE --  THYROIDAB --   Coagulation:  Lab 12/28/11 1005  LABPROT 12.2  INR 0.89    Alcohol Level:  Lab 12/28/11 1005  ETH <11    Study Results: 12/28/2011 CHEST - 2 VIEW  Findings: Prior CABG. Heart and mediastinal contours are within normal limits.  No focal opacities or effusions.  No acute bony abnormality.  IMPRESSION: No active cardiopulmonary disease.   Cyndie Chime, M.D.   12/28/2011  CT HEAD WITHOUT CONTRAST   Findings: No intracranial hemorrhage.  No parenchymal contusion. No midline shift or mass effect.  Basilar cisterns are patent. No skull base fracture.  No fluid in the paranasal sinuses or mastoid air cells.  There is periventricular white matter hypodensities.  There is generalized cortical atrophy.  IMPRESSION: No intracranial trauma.  Atrophy and microvascular disease.  Genevive Bi, M.D.   CT CERVICAL SPINE  Findings: There is reversal of normal cervical lordosis.  No prevertebral soft tissue swelling.  The there is endplate osteophytosis joint space narrowing at C6-C7.  No loss vertebral body height q. leak.  Normal facet to dilation.  Normal precervical junction.  There is some stranding within the subcutaneous tissues posterior X of the occipital lobe and C2 vertebral body (image 30)  No epidural hematoma is evident.  IMPRESSION:  1.  No evidence of acute cervical spine fracture. 2.  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 3.  Subcutaneous stranding posterior to the C2 vertebral body may relate to trauma. 4. Straightening of the normal cervical lordosis may be secondary to position, muscle spasm, or ligamentous injury.  Genevive Bi, M.D.   12/30/2011   MRA NECK WITHOUT CONTRAST Findings:  The arch is not evaluated.  Flow is antegrade in the vertebral arteries bilaterally.  The right vertebral artery is dominant to the left.  There is some signal loss, suggesting stenosis, in the proximal right vertebral artery. The left vertebral artery origin appears to be intact.  No other significant stenoses are present.  The right common carotid artery is within normal limits.  The proximal right internal carotid artery demonstrates mild irregularity without significant stenosis relative to the distal vessel.  The left common carotid artery demonstrates some distal irregularity without significant stenosis.  There is no significant flow within the left internal carotid artery.  IMPRESSION:  1.  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 2.  Probable focal stenosis of the proximal right vertebral artery, the dominant vessel. 3.  Mild irregularity of the proximal right  internal carotid artery without significant stenosis relative to the distal vessel. CHRISTOPHER W. MATTERN, M.D.   12/29/2011   MRI HEAD WITHOUT CONTRAST Findings:  Acute infarct in the left basal ganglia involving the head of the caudate, anterior limb internal capsule, and putamen. Acute infarct extends into the left insular cortex. There is T1 shortening within the infarct suggesting mild hemorrhage.  No other acute infarct.  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  Mild chronic microvascular ischemic change in the white matter. Negative for mass lesion.  Ventricle size is normal.  No midline shift.  IMPRESSION: Acute infarct in the left middle cerebral artery territory involving left basal ganglia and left insula.  This appears to be a slightly hemorrhagic infarct.  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  Camelia Phenes, M.D.   12/29/11 MRA HEAD  Findings: Both vertebral arteries are patent to the basilar. Basilar, superior  cerebellar, and posterior cerebral arteries are patent bilaterally.  PICA is patent bilaterally.  Small right PICA is patent.  Left internal carotid artery is patent. There is very little signal in the left middle cerebral artery branches which is likely due to slow flow as well as occlusion of multiple branches.  Right internal carotid artery is widely patent.  Both anterior cerebral arteries are supplied from the right and are patent. Right middle cerebral artery is widely patent.  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   Camelia Phenes, M.D.   12/29/11 2 D ECHO- Left ventricle: The cavity size was normal. Wall thickness was normal. Systolic function was normal. The estimated ejection fraction was in the range of 60% to 65%.  12/30/11 Carotid Duplex  Right internal carotid artery demonstrates elevated velocities in the 60-79% range with no evidence of significant plaque formation. The right vertebral artery has elevated velocities with antegrade flow. The left internal carotid artery demonstrates high resistance waveforms with no plaque morphology. The left vertebral artery was not insonated. 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  Therapies: PT: Difficulty walking;Hemiplegia dominant side. Recommend rehab consult.   ZO:[id number]  SLP: Moderate oral phase dysphagia;Moderate pharyngeal phase dysphagia.  Dysphagia 1(Puree);  Honey-thick liquid.  Assessment/Plan: 71 yo male with new acute infarct in the left middle cerebral artery territory involving left basal ganglia and left insula.  Hypertension- controlled Diabetes Mellitus- A1 C pending Hyperlipidemia- not at goal <80.  LDL 85  Plan:  1. HgbA1c  2. As MRI is suggestive of L ICA occlusion and CUS is less clear, will get CTA head and neck with contrast to better evaluation for stenosis versus occlusion. (cr0.8)  3. F/U MBS per  SLP  4. Prophylactic therapy-Antiplatelet med: Plavix - dose 75 mg daily  5. Risk Factor modification 6. Looks like Lovenox was discontinued for DVT ppx. Unless contraindicated, would recommend restarting this.   LOS: 2 days   Marya Fossa PA-C [facility] [phone] 12/30/2011  11:07 AM  I have seen and examined this patient and agree with the plan and examined as detailed above.  Kipp Laurence, MD [facility] Stroke Team [phone] 12/30/2011 10:35 AM

## 2011-12-31 ENCOUNTER — Inpatient Hospital Stay (HOSPITAL_COMMUNITY): Payer: Medicare Other

## 2011-12-31 LAB — GLUCOSE, CAPILLARY
Glucose-Capillary: 177 mg/dL — ABNORMAL HIGH (ref 70–99)
Glucose-Capillary: 179 mg/dL — ABNORMAL HIGH (ref 70–99)

## 2011-12-31 MED ORDER — FLUCONAZOLE IN SODIUM CHLORIDE 200-0.9 MG/100ML-% IV SOLN
200.0000 mg | Freq: Once | INTRAVENOUS | Status: AC
  Administered 2011-12-31: 200 mg via INTRAVENOUS
  Filled 2011-12-31: qty 100

## 2011-12-31 MED ORDER — INSULIN GLARGINE 100 UNIT/ML ~~LOC~~ SOLN: 7.0000 [IU] | Freq: Every day | SUBCUTANEOUS | Status: DC

## 2011-12-31 MED ORDER — FLUCONAZOLE 100MG IVPB
100.0000 mg | INTRAVENOUS | Status: DC
  Administered 2012-01-01 – 2012-01-03 (×3): 100 mg via INTRAVENOUS
  Filled 2011-12-31 (×3): qty 50

## 2011-12-31 MED ORDER — IOHEXOL 350 MG/ML SOLN
100.0000 mL | Freq: Once | INTRAVENOUS | Status: AC | PRN
  Administered 2011-12-31: 100 mL via INTRAVENOUS

## 2011-12-31 MED ORDER — ENOXAPARIN SODIUM 40 MG/0.4ML ~~LOC~~ SOLN
40.0000 mg | SUBCUTANEOUS | Status: DC
  Administered 2011-12-31 – 2012-01-02 (×3): 40 mg via SUBCUTANEOUS
  Filled 2011-12-31 (×4): qty 0.4

## 2011-12-31 MED ORDER — INSULIN GLARGINE 100 UNIT/ML ~~LOC~~ SOLN
7.0000 [IU] | Freq: Every day | SUBCUTANEOUS | Status: DC
  Administered 2011-12-31 – 2012-01-01 (×2): 7 [IU] via SUBCUTANEOUS

## 2011-12-31 NOTE — Progress Notes (Signed)
Appreciate neurology/pt/st.  SUBJECTIVE Suggests he is ok. Attempted to call patient's wife yesterday(wrong number listed).   1. Stroke     Past Medical History  Diagnosis Date  . Myocardial infarction    Current Facility-Administered Medications  Medication Dose Route Frequency Provider Last Rate Last Dose  . acetaminophen (TYLENOL) tablet 650 mg  650 mg Oral Q4H PRN Keanen Dohse, MD       Or  . acetaminophen (TYLENOL) suppository 650 mg  650 mg Rectal Q4H PRN Chevelle Coulson, MD      . aspirin suppository 300 mg  300 mg Rectal Daily Joachim Carton, MD       Or  . aspirin tablet 325 mg  325 mg Oral Daily Ambree Frances, MD   325 mg at 12/30/11 1210  . ezetimibe-simvastatin (VYTORIN) 10-40 MG per tablet 1 tablet  1 tablet Oral QHS Dorinne Graeff, MD   1 tablet at 12/30/11 2249  . fluconazole (DIFLUCAN) IVPB 100 mg  100 mg Intravenous Q24H Devonda Pequignot, MD      . fluconazole (DIFLUCAN) IVPB 200 mg  200 mg Intravenous Once Amelia Burgard, MD      . food thickener (THICK IT) powder   Oral PRN Jovanie Verge, MD      . hydrALAZINE (APRESOLINE) injection 5 mg  5 mg Intravenous Q8H PRN Zaniya Mcaulay, MD      . insulin aspart (novoLOG) injection 0-9 Units  0-9 Units Subcutaneous TID WC Avigayil Ton, MD   3 Units at 12/30/11 1837  . insulin glargine (LANTUS) injection 4 Units  4 Units Subcutaneous Daily Daune Colgate, MD   4 Units at 12/30/11 1210  . iohexol (OMNIPAQUE) 350 MG/ML injection 100 mL  100 mL Intravenous Once PRN Medication Radiologist, MD   100 mL at 12/31/11 0422  . irbesartan (AVAPRO) tablet 75 mg  75 mg Oral Daily Zuhair Lariccia, MD      . ondansetron (ZOFRAN) injection 4 mg  4 mg Intravenous Q6H PRN Michail Boyte, MD      . pantoprazole (PROTONIX) EC tablet 40 mg  40 mg Oral QHS Herby Abraham, PHARMD   40 mg at 12/30/11 2248  . senna-docusate (Senokot-S) tablet 1 tablet  1 tablet Oral QHS PRN Lan Entsminger, MD      . DISCONTD: enoxaparin (LOVENOX) injection 40 mg  40 mg  Subcutaneous Q24H Benny Lennert, PHARMD      . DISCONTD: enoxaparin (LOVENOX) injection 40 mg  40 mg Subcutaneous Q24H Benny Lennert, PHARMD       No Known Allergies Active Problems:  CVA (cerebral infarction)  DM (diabetes mellitus), type 2, uncontrolled  HTN (hypertension), benign   Vital signs in last 24 hours: Temp:  [98 F (36.7 C)-99.8 F (37.7 C)] 98.7 F (37.1 C) (04/14 0523) Pulse Rate:  [57-75] 57  (04/14 0523) Resp:  [18-20] 20  (04/14 0523) BP: (96-136)/(49-67) 135/67 mmHg (04/14 0523) SpO2:  [95 %-97 %] 96 % (04/14 0523) Weight change:     Intake/Output from previous day: 04/13 0701 - 04/14 0700 In: 240 [P.O.:240] Out: 1450 [Urine:1450] Intake/Output this shift:    Lab Results:  Basename 12/29/11 0620 12/28/11 2010  WBC 6.4 7.1  HGB 12.6* 13.4  HCT 36.8* 39.7  PLT 157 185   BMET  Basename 12/29/11 1437 12/29/11 0620  NA 136 137  K 4.4 3.8  CL 104 106  CO2 23 22  GLUCOSE 256* 147*  BUN 11 14  CREATININE 0.80 0.88  CALCIUM 8.9  8.6    Studies/Results: Mr Angiogram Neck Wo Contrast  12/30/2011  *RADIOLOGY REPORT*  Clinical Data:  Left basal ganglia infarct.  Contrast was not given as there was extravasation during the intent at the junction.  A second injection was not attempted.  MRA NECK WITHOUT CONTRAST  Technique:  Angiographic images of the neck were obtained using MRA technique without intravenous contrast.  Carotid stenosis measurements (when applicable) are obtained utilizing NASCET criteria, using the distal internal carotid diameter as the denominator.  Comparison:  MRA head 12/29/2011.  Findings:  The arch is not evaluated.  Flow is antegrade in the vertebral arteries bilaterally.  The right vertebral artery is dominant to the left.  There is some signal loss, suggesting stenosis, in the proximal right vertebral artery. The left vertebral artery origin appears to be intact.  No other significant stenoses are present.  The right common  carotid artery is within normal limits.  The proximal right internal carotid artery demonstrates mild irregularity without significant stenosis relative to the distal vessel.  The left common carotid artery demonstrates some distal irregularity without significant stenosis.  There is no significant flow within the left internal carotid artery.  IMPRESSION:  1.  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 2.  Probable focal stenosis of the proximal right vertebral artery, the dominant vessel. 3.  Mild irregularity of the proximal right internal carotid artery without significant stenosis relative to the distal vessel.  Original Report Authenticated By: Jamesetta Orleans. MATTERN, M.D.   Mr Brain Wo Contrast  12/29/2011  *RADIOLOGY REPORT*  Clinical Data:  Stroke.  Right-sided weakness.  Aphasia  MRI HEAD WITHOUT CONTRAST MRA HEAD WITHOUT CONTRAST  Technique:  Multiplanar, multiecho pulse sequences of the brain and surrounding structures were obtained without intravenous contrast. Angiographic images of the head were obtained using MRA technique without contrast.  Comparison:  CT 12/28/2011  MRI HEAD  Findings:  Acute infarct in the left basal ganglia involving the head of the caudate, anterior limb internal capsule, and putamen. Acute infarct extends into the left insular cortex. There is T1 shortening within the infarct suggesting mild hemorrhage.  No other acute infarct.  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  Mild chronic microvascular ischemic change in the white matter. Negative for mass lesion.  Ventricle size is normal.  No midline shift.  IMPRESSION: Acute infarct in the left middle cerebral artery territory involving left basal ganglia and left insula.  This appears to be a slightly hemorrhagic infarct.  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  MRA HEAD  Findings: Both vertebral arteries are patent to the basilar. Basilar, superior cerebellar, and posterior  cerebral arteries are patent bilaterally.  PICA is patent bilaterally.  Small right PICA is patent.  Left internal carotid artery is patent. There is very little signal in the left middle cerebral artery branches which is likely due to slow flow as well as occlusion of multiple branches.  Right internal carotid artery is widely patent.  Both anterior cerebral arteries are supplied from the right and are patent. Right middle cerebral artery is widely patent.  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  Original Report Authenticated By: Camelia Phenes, M.D.   Mr Mra Head/brain Wo Cm  12/29/2011  *RADIOLOGY REPORT*  Clinical Data:  Stroke.  Right-sided weakness.  Aphasia  MRI HEAD WITHOUT CONTRAST MRA HEAD WITHOUT CONTRAST  Technique:  Multiplanar, multiecho pulse sequences of the brain and surrounding structures were obtained without intravenous contrast. Angiographic images of the head were obtained using MRA technique without contrast.  Comparison:  CT 12/28/2011  MRI HEAD  Findings:  Acute infarct in the left basal ganglia involving the head of the caudate, anterior limb internal capsule, and putamen. Acute infarct extends into the left insular cortex. There is T1 shortening within the infarct suggesting mild hemorrhage.  No other acute infarct.  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  Mild chronic microvascular ischemic change in the white matter. Negative for mass lesion.  Ventricle size is normal.  No midline shift.  IMPRESSION: Acute infarct in the left middle cerebral artery territory involving left basal ganglia and left insula.  This appears to be a slightly hemorrhagic infarct.  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  MRA HEAD  Findings: Both vertebral arteries are patent to the basilar. Basilar, superior cerebellar, and posterior cerebral arteries are patent bilaterally.  PICA is patent  bilaterally.  Small right PICA is patent.  Left internal carotid artery is patent. There is very little signal in the left middle cerebral artery branches which is likely due to slow flow as well as occlusion of multiple branches.  Right internal carotid artery is widely patent.  Both anterior cerebral arteries are supplied from the right and are patent. Right middle cerebral artery is widely patent.  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  Original Report Authenticated By: Camelia Phenes, M.D.    Medications: I have reviewed the patient's current medications.   Physical exam GENERAL- alert HEAD- normal atraumatic, no neck masses, normal thyroid, no jvd. Oral trhush. RESPIRATORY- appears well, vitals normal, no respiratory distress, acyanotic, normal RR, ear and throat exam is normal, neck free of mass or lymphadenopathy, chest clear, no wheezing, crepitations, rhonchi, normal symmetric air entry CVS- regular rate and rhythm, S1, S2 normal, no murmur, click, rub or gallop ABDOMEN- abdomen is soft without significant tenderness, masses, organomegaly or guarding NEURO- dysarthric. Can move right arm/leg, an improvement from yesterday. EXTREMITIES- extremities normal, atraumatic, no cyanosis or edema  Plan .CVA (Acute cerebral infarction in the left middle cerebral artery territory involving left basal ganglia and left insula- slightly hemorrhagic) in setting of occluded ICA- Improving. Await CTA report. Appreciate neuro input. Continue current mx. Will consult cir. .DM (diabetes mellitus), type 2, uncontrolled- better controlled. hb a1c 14.4. Continue SSI. Increase lantus.  Hyperlipidemia- on vytorin, continue. Marland KitchenHTN (hypertension), benign-controlled on current meds. Oral thrush- DM predisposition. will give fluconazole, as swallowing still unreliable, higher isk of aspiration with oral solution. Dvt/gi prophylaxis.    Condition closely guarded.      [name] 12/31/2011 10:46 AM Pager: [phone].

## 2011-12-31 NOTE — Progress Notes (Addendum)
History: Mr Begue was transferred from [facility] after fall on the porch. History not clear. He was found to have dysarthric speech and right sided weakness. Per EMS report, this is baseline.  CT brain did not show a bleed. As he was discovered on the porch in the morning, with him last said to be normal last night, he was outside tpa window, hence code stroke not called at med center. Patient attempts to speak but he is dysarthric and difficult to understand but follows instructions.  LSN:bed time 4/10/123 tPA Given: no, outside window Interval: Baseline (04/11 1008) Level of Consciousness (1a. ): Alert, keenly responsive (04/13 2125) LOC Questions (1b. ): Answers neither question correctly (04/13 2125) LOC Commands (1c. ): Performs both tasks correctly (04/13 2125) Best Gaze (2. ): Normal (04/13 2125) Visual (3. ): No visual loss (04/13 2125) Facial Palsy (4. ): Minor paralysis (04/13 2125) Motor Arm, Left (5a. ): No drift (04/13 2125) Motor Arm, Right (5b. ): Some effort against gravity (04/13 2125) Motor Leg, Left (6a. ): No drift (04/13 2125) Motor Leg, Right (6b. ): No drift (04/13 2125) Limb Ataxia (7. ): Absent (04/13 2125) Sensory (8. ): Normal, no sensory loss (04/13 2125) Best Language (9. ): Severe aphasia (04/13 2125) Dysarthria (10. ): Severe dysarthria, patient's speech is so slurred as to be unintelligible in the absence of or out of proportion to any dysphasia, or is mute/anarthric (04/13 2125) Inattention/Extinction: No Abnormality (04/13 2125) Total: 9  (04/13 2125)  Subjective: Alert, sitting up in bed.     Objective: BP 135/67  Pulse 57  Temp(Src) 98.7 F (37.1 C) (Oral)  Resp 20  Ht 5\' 3"  (1.6 m)  Wt 68.04 kg (150 lb)  BMI 26.57 kg/m2  SpO2 96%  CBGs  Basename 12/31/11 0446 12/31/11 0041 12/30/11 2224 12/30/11 1641 12/30/11 1142 12/30/11 0740 12/30/11 0423 12/29/11 2250 12/29/11 1628 12/29/11 1410  GLUCAP 177* 179* 188* 218* 258* 170* 150*  242* 230* 247*    Diet: D1, honey thick  Activity: up with assistance  DVT Prophylaxis: on hold due to possible ischemic hemorrhagic transformation   Medications: Scheduled:    . aspirin  300 mg Rectal Daily   Or  . aspirin  325 mg Oral Daily  . ezetimibe-simvastatin  1 tablet Oral QHS  . insulin aspart  0-9 Units Subcutaneous TID WC  . insulin glargine  4 Units Subcutaneous Daily  . irbesartan  75 mg Oral Daily  . pantoprazole  40 mg Oral QHS  . DISCONTD: enoxaparin  40 mg Subcutaneous QHS  . DISCONTD: enoxaparin (LOVENOX) injection  40 mg Subcutaneous Q24H  . DISCONTD: enoxaparin (LOVENOX) injection  40 mg Subcutaneous Q24H  . DISCONTD: insulin aspart  0-9 Units Subcutaneous Q4H    Neurologic Exam: Mental Status: Alert.  Attempts to conversate, but speech intelligible.  Able to follow 3 step commands without difficulty. Cranial Nerves: II- Visual fields grossly intact. III/IV/VI-Extraocular movements intact.  Pupils reactive bilaterally. V/VII-Smile symmetric VIII-hearing grossly intact IX/X-normal gag XI-bilateral shoulder shrug XII-midline tongue extension Motor: 5/5 LUE/ LLE.  with normal tone and bulk RUE weak,  Sensory: Light touch intact throughout, bilaterally Deep Tendon Reflexes: 2+ and symmetric throughout Plantars: upgoing on right, downgoing on left. Cerebellar: Normal finger-to-nose, normal rapid alternating movements normal on left.   Lab Results: Basic Metabolic Panel:  Lab 12/29/11 1610 12/29/11 0620  NA 136 137  K 4.4 3.8  CL 104 106  CO2 23 22  GLUCOSE 256* 147*  BUN 11 14  CREATININE 0.80 0.88  CALCIUM 8.9 8.6  MG -- --  PHOS -- --   Liver Function Tests:  Lab 12/29/11 1437 12/29/11 0620  AST 19 11  ALT 8 7  ALKPHOS 83 75  BILITOT 0.3 0.2*  PROT 6.1 5.4*  ALBUMIN 2.9* 2.5*   CBC:  Lab 12/29/11 0620 12/28/11 2010  WBC 6.4 7.1  NEUTROABS -- --  HGB 12.6* 13.4  HCT 36.8* 39.7  MCV 80.0 80.7  PLT 157 185   Cardiac  Enzymes:  Lab 12/28/11 1005  CKTOTAL --  CKMB --  CKMBINDEX --  TROPONINI <0.30   CBG:  Lab 12/31/11 0446 12/31/11 0041 12/30/11 2224 12/30/11 1641 12/30/11 1142 12/30/11 0740  GLUCAP 177* 179* 188* 218* 258* 170*   Hemoglobin A1C:  Lab 12/29/11 1437  HGBA1C 14.4*   Fasting Lipid Panel:  Lab 12/30/11 0738  CHOL 174  HDL 42  LDLCALC 85  TRIG 235*  CHOLHDL 4.1  LDLDIRECT --   Thyroid Function Tests:  Lab 12/29/11 0620  TSH 1.374  T4TOTAL --  FREET4 --  T3FREE --  THYROIDAB --   Coagulation:  Lab 12/28/11 1005  LABPROT 12.2  INR 0.89    Alcohol Level:  Lab 12/28/11 1005  ETH <11    Study Results: 12/28/2011 CHEST - 2 VIEW  Findings: Prior CABG. Heart and mediastinal contours are within normal limits.  No focal opacities or effusions.  No acute bony abnormality.  IMPRESSION: No active cardiopulmonary disease.   Cyndie Chime, M.D.   12/28/2011  CT HEAD WITHOUT CONTRAST   Findings: No intracranial hemorrhage.  No parenchymal contusion. No midline shift or mass effect.  Basilar cisterns are patent. No skull base fracture.  No fluid in the paranasal sinuses or mastoid air cells.  There is periventricular white matter hypodensities.  There is generalized cortical atrophy.  IMPRESSION: No intracranial trauma.  Atrophy and microvascular disease.  Genevive Bi, M.D.   CT CERVICAL SPINE  Findings: There is reversal of normal cervical lordosis.  No prevertebral soft tissue swelling.  The there is endplate osteophytosis joint space narrowing at C6-C7.  No loss vertebral body height q. leak.  Normal facet to dilation.  Normal precervical junction.  There is some stranding within the subcutaneous tissues posterior X of the occipital lobe and C2 vertebral body (image 30)  No epidural hematoma is evident.  IMPRESSION:  1.  No evidence of acute cervical spine fracture. 2.  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 3.  Subcutaneous stranding posterior to the C2 vertebral  body may relate to trauma. 4. Straightening of the normal cervical lordosis may be secondary to position, muscle spasm, or ligamentous injury.  Genevive Bi, M.D.   12/30/2011   MRA NECK WITHOUT CONTRAST Findings:  The arch is not evaluated.  Flow is antegrade in the vertebral arteries bilaterally.  The right vertebral artery is dominant to the left.  There is some signal loss, suggesting stenosis, in the proximal right vertebral artery. The left vertebral artery origin appears to be intact.  No other significant stenoses are present.  The right common carotid artery is within normal limits.  The proximal right internal carotid artery demonstrates mild irregularity without significant stenosis relative to the distal vessel.  The left common carotid artery demonstrates some distal irregularity without significant stenosis.  There is no significant flow within the left internal carotid artery.  IMPRESSION:  1.  The left internal carotid artery is occluded at its  origin. 2.  Probable focal stenosis of the proximal right vertebral artery, the dominant vessel. 3.  Mild irregularity of the proximal right internal carotid artery without significant stenosis relative to the distal vessel. CHRISTOPHER W. MATTERN, M.D.   12/29/2011   MRI HEAD WITHOUT CONTRAST Findings:  Acute infarct in the left basal ganglia involving the head of the caudate, anterior limb internal capsule, and putamen. Acute infarct extends into the left insular cortex. There is T1 shortening within the infarct suggesting mild hemorrhage.  No other acute infarct.  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  Mild chronic microvascular ischemic change in the white matter. Negative for mass lesion.  Ventricle size is normal.  No midline shift.  IMPRESSION: Acute infarct in the left middle cerebral artery territory involving left basal ganglia and left insula.  This appears to be a slightly hemorrhagic infarct.  There is occlusion of the left  internal carotid artery.  Camelia Phenes, M.D.   12/29/11 MRA HEAD  Findings: Both vertebral arteries are patent to the basilar. Basilar, superior cerebellar, and posterior cerebral arteries are patent bilaterally.  PICA is patent bilaterally.  Small right PICA is patent.  Left internal carotid artery is patent. There is very little signal in the left middle cerebral artery branches which is likely due to slow flow as well as occlusion of multiple branches.  Right internal carotid artery is widely patent.  Both anterior cerebral arteries are supplied from the right and are patent. Right middle cerebral artery is widely patent.  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   Camelia Phenes, M.D.   12/29/11 2 D ECHO- Left ventricle: The cavity size was normal. Wall thickness was normal. Systolic function was normal. The estimated ejection fraction was in the range of 60% to 65%.  12/30/11 Carotid Duplex  Right internal carotid artery demonstrates elevated velocities in the 60-79% range with no evidence of significant plaque formation. The right vertebral artery has elevated velocities with antegrade flow. The left internal carotid artery demonstrates high resistance waveforms with no plaque morphology. The left vertebral artery was not insonated. 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  Therapies: PT: Difficulty walking;Hemiplegia dominant side. Recommend rehab consult.   ZO:[id number]  SLP: Moderate oral phase dysphagia;Moderate pharyngeal phase dysphagia.  Dysphagia 1(Puree);  Honey-thick liquid.  Assessment/Plan: 71 yo male with new acute infarct in the left middle cerebral artery territory involving left basal ganglia and left insula.  Hypertension- controlled Diabetes Mellitus-uncontrolled;  A1 C 14.4 Hyperlipidemia- not at goal <80.  LDL 85  Plan:  1.  CTA head and neck with contrast-  interpretation pending 2. F/U MBS per SLP  3. Prophylactic therapy-Antiplatelet med: Plavix - dose 75 mg daily  4. Aggressive Risk Factor modification 5. Restart lovenox for DVT pp as no hemorrhage on scan.   LOS: 3 days   Jana Hakim [facility] [phone] 12/31/2011  7:54 AM  I have seen and examined this patient and agree with the plan and examined as detailed above.  Kipp Laurence, MD [facility] Stroke Team [phone] 12/31/2011 10:25 AM

## 2011-12-31 NOTE — Progress Notes (Signed)
Occupational Therapy Evaluation  Past Medical History  Diagnosis Date  . Myocardial infarction    History reviewed. No pertinent past surgical history.   12/31/11 1015  OT Visit Information  Last OT Received On 12/31/11  Precautions  Precautions Fall  Restrictions  Weight Bearing Restrictions No  Home Living  Lives With Spouse  Available Help at Discharge Family  Type of Home House  Home Layout One level  Bathroom Shower/Tub Programmer, multimedia Yes  How Accessible Accessible via walker  Additional Comments wife available 24/7 but can not physically lift pt  Prior Function  Level of Independence Independent  Able to Take Stairs? Yes  Driving Yes  Vocation Retired  Geneticist, molecular Expressive difficulties  ADL  Eating/Feeding Simulated;Moderate assistance  Where Assessed - Eating/Feeding Edge of bed  Grooming Performed;Moderate assistance  Grooming Details (indicate cue type and reason) apraxia noted  Where Assessed - Grooming Sitting, bed;Unsupported  Upper Body Bathing Performed;Moderate assistance  Where Assessed - Upper Body Bathing Sitting, bed;Unsupported  Lower Body Bathing Performed;Maximal assistance  Lower Body Bathing Details (indicate cue type and reason) leans R at times. gestural cues to increase midline orientation  Where Assessed - Lower Body Bathing Supported;Sit to stand from bed  Upper Body Dressing Performed;Maximal assistance  Where Assessed - Upper Body Dressing Sitting, bed;Unsupported  Lower Body Dressing Performed;Maximal assistance  Where Assessed - Lower Body Dressing Sit to stand from bed;Supported  Toilet Transfer Simulated;Maximal assistance  Toilet Transfer Method Stand pivot  Toileting - Clothing Manipulation Maximal assistance  Where Assessed - Toileting Clothing Manipulation Standing  Toileting - Hygiene Not assessed  Tub/Shower Transfer Not assessed  Ambulation Related to  ADLs Mod A RW level  ADL Comments Apparent praxia. does better with gestural cues and initiating functional patterrn  Vision - History  Baseline Vision Other (comment)  Visual History (family states "blurred vision" when sugar is high)  Patient Visual Report Other (comment) (unable to state)  Vision - Assessment  Vision Assessment Vision not tested  Additional Comments will further assess  Perception  Perception WFL  Praxis  Praxis Impaired  Praxis Impairment Details Ideomotor;Motor planning  Cognition  Arousal/Alertness Awake/alert  Overall Cognitive Status Impaired  Orientation Level Other (Comment) (unable)  Following Commands Follows one step commands inconsistently  Safety/Judgement Decreased awareness of safety precautions  Decreased Safety/Judgement Decreased awareness of need for assistance  Safety/Judgement - Other Comments `  Cognition - Other Comments Unable to fully assess due to aphasia.  Sensation  Light Touch Impaired by gross assessment  Stereognosis Not tested  Hot/Cold Not tested  Proprioception Impaired by gross assessment  Coordination  Gross Motor Movements are Fluid and Coordinated No  Fine Motor Movements are Fluid and Coordinated No  Coordination and Movement Description Poor coordiantion due to hemiparesis  Finger Nose Finger Test unable  Bed Mobility  Bed Mobility Yes  Supine to Sit 4: Min assist;HOB elevated (Comment degrees)  Sitting - Scoot to Edge of Bed 4: Min assist  Transfers  Transfers Yes  Sit to Stand 3: Mod assist;With upper extremity assist;From bed  Sit to Stand Details (indicate cue type and reason) R lat lean at times  Stand to Sit 4: Min assist;With upper extremity assist;With armrests;To chair/3-in-1  RUE Assessment  RUE Assessment X  RUE AROM (degrees)  Overall AROM Right Upper Extremity Deficits  RUE Overall AROM Comments Able to initiate RUE movement. Difficulty with isolation and control of movement. Gross  grasp.release.gross hand  to mouth in supine. unable to complete against gravity.  RUE PROM (degrees)  Overall PROM Right Upper Extremity Within functional limits for tasks performed  RUE Strength  RUE Overall Strength Comments Brunnstrom stage IV hand and arm. compensates for R shoulder movement.  LUE Assessment  LUE Assessment WFL  Cervical Assessment  Cervical Assessment WFL  Lumbar Assessment  Lumbar Assessment WFL  OT - End of Session  Equipment Utilized During Treatment Gait belt  Activity Tolerance Patient tolerated treatment well  Patient left in chair;with call bell in reach  Nurse Communication Other (comment) (pt in chair)  General  Behavior During Session [facility] for tasks performed  Cognition Impaired  Cognitive Impairment ideomotor apraxia. cues for sequencing.to further assess  OT Assessment  Clinical Impression Statement 71 yo s/p L CVA with R hemiparesis. Pt will benefit from skilled OT services secondary to defecits below to increase indep with ADL and functional mobility for ADL. PT will beneit from CIR  before return to home. Spoke with family over te phone. there is 24/7 family support available. Feel that pt will be able to reach a mod I/S level after CIR. Discussed with MD and rec rehab consult.  OT Recommendation/Assessment Patient will need skilled OT in the acute care venue  OT Problem List Decreased strength;Decreased range of motion;Decreased activity tolerance;Impaired balance (sitting and/or standing);Decreased coordination;Decreased cognition;Decreased safety awareness;Decreased knowledge of use of DME or AE;Decreased knowledge of precautions;Impaired sensation;Impaired UE functional use  Barriers to Discharge None  OT Therapy Diagnosis  Generalized weakness;Cognitive deficits;Hemiplegia dominant side;Apraxia  OT Plan  OT Frequency Min 2X/week  OT Treatment/Interventions Self-care/ADL training;Therapeutic exercise;Neuromuscular education;Energy conservation;DME  and/or AE instruction;Therapeutic activities;Cognitive remediation/compensation;Patient/family education;Balance training  OT Recommendation  Recommendations for Other Services Rehab consult  Follow Up Recommendations Inpatient Rehab  Equipment Recommended Defer to next venue  Individuals Consulted  Consulted and Agree with Results and Recommendations Patient;Family member/caregiver  Family Member Consulted daughter  Acute Rehab OT Goals  OT Goal Formulation With patient  Time For Goal Achievement 2 weeks  ADL Goals  Pt Will Perform Eating with supervision;Sitting, chair;with cueing (comment type and amount) (to use R hand as gross assist)  Pt Will Perform Grooming Supported;with cueing (comment type and amount);Standing at sink;with supervision  Pt Will Perform Upper Body Bathing with supervision;Sitting at sink;Supported;with cueing (comment type and amount)  Pt Will Perform Lower Body Bathing with min assist;Supported;Sit to stand from chair;Sitting at sink;with cueing (comment type and amount)  Pt Will Transfer to Toilet with min assist;Ambulation;3-in-1;with cueing (comment type and amount)  Pt Will Perform Toileting - Hygiene with supervision;Standing at 3-in-1/toilet  ADL Goal: Eating - Progress Goal set today  ADL Goal: Grooming - Progress Goal set today  ADL Goal: Upper Body Bathing - Progress Goal set today  ADL Goal: Lower Body Bathing - Progress Goal set today  ADL Goal: Toilet Transfer - Progress Goal set today  ADL Goal: Toileting - Hygiene - Progress Goal set today  Arm Goals  Additional Arm Goal #1 Complete sROM RUE with min cues.  Arm Goal: Additional Goal #1 - Progress Goal set today  Written Expression  Dominant Hand Right    Luisa Dago, OTR/L  [phone] 12/31/2011

## 2011-12-31 NOTE — Progress Notes (Signed)
Pharmacy: lovenox for VTE prophylaxis -Wt= 68kg -SCr=0.8 -CrCl~70 A/P -71 yo male with CVA to start lovenox for VTE prophylaxis -Will give 40mg  [place] lovenox q24hr and sign off for now. -Please contact pharmacy for other needs.  Thank you, Harland German, Pharm D 12/31/2011 1:39 PM

## 2012-01-01 DIAGNOSIS — I6330 Cerebral infarction due to thrombosis of unspecified cerebral artery: Secondary | ICD-10-CM

## 2012-01-01 LAB — GLUCOSE, CAPILLARY
Glucose-Capillary: 139 mg/dL — ABNORMAL HIGH (ref 70–99)
Glucose-Capillary: 179 mg/dL — ABNORMAL HIGH (ref 70–99)
Glucose-Capillary: 248 mg/dL — ABNORMAL HIGH (ref 70–99)
Glucose-Capillary: 132 mg/dL — ABNORMAL HIGH (ref 70–99)

## 2012-01-01 LAB — COMPREHENSIVE METABOLIC PANEL
AST: 16 U/L (ref 0–37)
CO2: 23 mEq/L (ref 19–32)
Calcium: 9 mg/dL (ref 8.4–10.5)
Creatinine, Ser: 0.83 mg/dL (ref 0.50–1.35)
GFR calc non Af Amer: 87 mL/min — ABNORMAL LOW (ref >90)
Sodium: 135 mEq/L (ref 135–145)
Total Protein: 6 g/dL (ref 6.0–8.3)

## 2012-01-01 LAB — CBC
MCH: 27.2 pg (ref 26.0–34.0)
MCHC: 34.6 g/dL (ref 30.0–36.0)
MCV: 78.8 fL (ref 78.0–100.0)
Platelets: 133 10*3/uL — ABNORMAL LOW (ref 150–400)
RDW: 12.6 % (ref 11.5–15.5)

## 2012-01-01 MED ORDER — ASPIRIN 81 MG PO CHEW
81.0000 mg | CHEWABLE_TABLET | Freq: Every day | ORAL | Status: DC
  Administered 2012-01-02 – 2012-01-03 (×2): 81 mg via ORAL
  Filled 2012-01-01 (×2): qty 1

## 2012-01-01 MED ORDER — CLOPIDOGREL BISULFATE 75 MG PO TABS
75.0000 mg | ORAL_TABLET | Freq: Every day | ORAL | Status: DC
  Administered 2012-01-01 – 2012-01-03 (×3): 75 mg via ORAL
  Filled 2012-01-01 (×3): qty 1

## 2012-01-01 MED ORDER — SODIUM CHLORIDE 0.9 % IV BOLUS (SEPSIS)
500.0000 mL | Freq: Once | INTRAVENOUS | Status: AC
  Administered 2012-01-01: 500 mL via INTRAVENOUS

## 2012-01-01 MED ORDER — INSULIN GLARGINE 100 UNIT/ML ~~LOC~~ SOLN
10.0000 [IU] | Freq: Every day | SUBCUTANEOUS | Status: DC
  Administered 2012-01-02 – 2012-01-03 (×2): 10 [IU] via SUBCUTANEOUS

## 2012-01-01 NOTE — Progress Notes (Signed)
Utilization Review Completed.[name] T4/15/2013   

## 2012-01-01 NOTE — Progress Notes (Signed)
Appreciate neuro/cir.  SUBJECTIVE Remains dysarthric but suggests he is ok.   1. Stroke     Past Medical History  Diagnosis Date  . Myocardial infarction    Current Facility-Administered Medications  Medication Dose Route Frequency Provider Last Rate Last Dose  . acetaminophen (TYLENOL) tablet 650 mg  650 mg Oral Q4H PRN Kaislyn Gulas, MD       Or  . acetaminophen (TYLENOL) suppository 650 mg  650 mg Rectal Q4H PRN Fran Neiswonger, MD      . aspirin chewable tablet 81 mg  81 mg Oral Daily Layne Benton, NP      . clopidogrel (PLAVIX) tablet 75 mg  75 mg Oral Q breakfast Layne Benton, NP      . enoxaparin (LOVENOX) injection 40 mg  40 mg Subcutaneous Q24H Benny Lennert, PHARMD   40 mg at 12/31/11 1710  . ezetimibe-simvastatin (VYTORIN) 10-40 MG per tablet 1 tablet  1 tablet Oral QHS Calinda Stockinger, MD   1 tablet at 12/31/11 2300  . fluconazole (DIFLUCAN) IVPB 100 mg  100 mg Intravenous Q24H Janah Mcculloh, MD   100 mg at 01/01/12 1056  . food thickener (THICK IT) powder   Oral PRN Javarus Dorner, MD      . hydrALAZINE (APRESOLINE) injection 5 mg  5 mg Intravenous Q8H PRN Calinda Stockinger, MD      . insulin aspart (novoLOG) injection 0-9 Units  0-9 Units Subcutaneous TID WC Olamide Carattini, MD   3 Units at 01/01/12 1154  . insulin glargine (LANTUS) injection 7 Units  7 Units Subcutaneous Daily Zarai Orsborn, MD   7 Units at 01/01/12 1056  . ondansetron (ZOFRAN) injection 4 mg  4 mg Intravenous Q6H PRN Leonette Tischer, MD      . pantoprazole (PROTONIX) EC tablet 40 mg  40 mg Oral QHS Herby Abraham, PHARMD   40 mg at 12/31/11 2200  . senna-docusate (Senokot-S) tablet 1 tablet  1 tablet Oral QHS PRN Ayvah Caroll, MD      . sodium chloride 0.9 % bolus 500 mL  500 mL Intravenous Once Terriona Horlacher, MD   500 mL at 01/01/12 1200  . DISCONTD: aspirin suppository 300 mg  300 mg Rectal Daily Autry Droege, MD      . DISCONTD: aspirin tablet 325 mg  325 mg Oral Daily Rafay Dahan, MD   325 mg at  01/01/12 1052  . DISCONTD: irbesartan (AVAPRO) tablet 75 mg  75 mg Oral Daily Tigerlily Christine, MD       No Known Allergies Active Problems:  CVA (cerebral infarction)  DM (diabetes mellitus), type 2, uncontrolled  HTN (hypertension), benign   Vital signs in last 24 hours: Temp:  [97.2 F (36.2 C)-98.7 F (37.1 C)] 98.4 F (36.9 C) (04/15 1824) Pulse Rate:  [52-74] 54  (04/15 1824) Resp:  [16-20] 18  (04/15 1824) BP: (84-148)/(54-82) 148/66 mmHg (04/15 1824) SpO2:  [96 %-99 %] 96 % (04/15 1824) Weight change:     Intake/Output from previous day: 04/14 0701 - 04/15 0700 In: -  Out: 575 [Urine:575] Intake/Output this shift: Total I/O In: 240 [P.O.:240] Out: 300 [Urine:300]  Lab Results:  [facility] 01/01/12 0512  WBC 6.5  HGB 13.2  HCT 38.2*  PLT 133*   BMET  Basename 01/01/12 0512  NA 135  K 3.7  CL 103  CO2 23  GLUCOSE 179*  BUN 14  CREATININE 0.83  CALCIUM 9.0    Studies/Results: Ct Angio Head W/cm &/or  Wo Cm  12/31/2011  *RADIOLOGY REPORT*  Clinical Data:  The no acute the left MCA territory infarct.  The left internal carotid artery occlusion.  CT ANGIOGRAPHY HEAD AND NECK  Technique:  Multidetector CT imaging of the head and neck was performed using the standard protocol during bolus administration of intravenous contrast.  Multiplanar CT image reconstructions including MIPs were obtained to evaluate the vascular anatomy. Carotid stenosis measurements (when applicable) are obtained utilizing NASCET criteria, using the distal internal carotid diameter as the denominator.  Contrast: OMNIPAQUE IOHEXOL 350 MG/ML SOLN,  Comparison:  MRI brain and MRA head 12/29/2011.  Noncontrast MRA neck 12/30/2011.  CTA NECK  Findings:  A standard three-vessel arch configuration is present. Dense calcifications are present at the origins of the great vessels.  There is severe stenosis of the proximal left subclavian artery proximal to the vertebral artery takeoff.  The lumen is  narrowed to 2 mm, relative to a more distal segment of 7.5 mm.  The calculated stenosis is 70%.  The vertebral artery origins are both well visualized.  There is a high-grade stenosis of the proximal right vertebral artery relative to the more distal vessel.  The right vertebral artery is than the dominant vessel.  No other significant vertebral artery stenoses are evident in the neck. The left vertebral artery origin is unremarkable.  The right common carotid artery demonstrates minimal distal irregularity.  There is calcified and noncalcified plaque along the posterior margin of the proximal right internal carotid artery. There is no significant stenosis relative to the distal segment. The remainder of the cervical right ICA is within normal limits.  The left internal carotid artery demonstrates calcifications at its origin without significant stenosis.  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  There is no reconstitution in the neck.  Moderate spondylosis of the cervical spine is most evident at C6-7 with right greater than left osseous foraminal narrowing.  The soft tissues demonstrate inflammatory changes in the subcutaneous fat posterior to C1 and C2.  The lung apices are clear.   Review of the MIP images confirms the above findings.  IMPRESSION:  1.  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 There is no reconstitution in the neck. 2.  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 3.  High-grade stenosis of the proximal right vertebral artery, the dominant vessel. 4.  Atherosclerotic irregularity within the posterior proximal right internal carotid artery without significant stenosis relative to the distal vessel. 5.  Inflammatory changes within subcutaneous tissues of the posterior neck at the level of C1 and C2.  CTA HEAD  Findings:  The left insular and basal ganglia non hemorrhagic infarct is again noted.  There is mass  effect with effacement of the left lateral ventricle and sulci along the insular cortex.  No significant midline shift is present.  No other acute infarct is evident.  White matter changes are again noted.  Contrast is present in the left internal carotid artery at the petrous segment.  This likely reflects reconstitution from the ophthalmic segment.  No cervical reconstitution is evident.  The vessel is of smaller caliber than on the right.  There are calcifications in the cavernous segment without an additional stenosis.  The cavernous right internal carotid artery is within normal limits.  The terminal left ICA is smaller than on the right.  The left M1 caliber is decreased relative to the right.  The  A1 segments fill normally. The central MCA branches on the left are markedly attenuated, compatible with the area of infarction.  The right MCA bifurcation and branch vessels are within normal limits. The dural sinuses fill normally.  The ACA branch vessels are unremarkable.  The right vertebral artery is the dominant vessel.  Both PICA origins are visualized and normal.  The basilar artery is within normal limits.  Both posterior cerebral arteries originate from the basilar tip.  The PCA branch vessels are unremarkable.  The dural sinuses are patent.   Review of the MIP images confirms the above findings.  IMPRESSION:  1.  Reconstitution of the left internal carotid artery with contrast first seen in the petrous segment.  Reconstitution is likely at the ophthalmic level. 2.  Markedly decreased caliber of the left M1 segment with significant attenuation of medial MCA branch vessels compatible with area of infarction. 3.  Mild small vessel disease otherwise.  4.  Similar appearance of non hemorrhagic infarct involving the left basal ganglia and insular cortex.  Original Report Authenticated By: Jamesetta Orleans. MATTERN, M.D.   Ct Angio Neck W/cm &/or Wo/cm  12/31/2011  *RADIOLOGY REPORT*  Clinical Data:  The no  acute the left MCA territory infarct.  The left internal carotid artery occlusion.  CT ANGIOGRAPHY HEAD AND NECK  Technique:  Multidetector CT imaging of the head and neck was performed using the standard protocol during bolus administration of intravenous contrast.  Multiplanar CT image reconstructions including MIPs were obtained to evaluate the vascular anatomy. Carotid stenosis measurements (when applicable) are obtained utilizing NASCET criteria, using the distal internal carotid diameter as the denominator.  Contrast: OMNIPAQUE IOHEXOL 350 MG/ML SOLN,  Comparison:  MRI brain and MRA head 12/29/2011.  Noncontrast MRA neck 12/30/2011.  CTA NECK  Findings:  A standard three-vessel arch configuration is present. Dense calcifications are present at the origins of the great vessels.  There is severe stenosis of the proximal left subclavian artery proximal to the vertebral artery takeoff.  The lumen is narrowed to 2 mm, relative to a more distal segment of 7.5 mm.  The calculated stenosis is 70%.  The vertebral artery origins are both well visualized.  There is a high-grade stenosis of the proximal right vertebral artery relative to the more distal vessel.  The right vertebral artery is than the dominant vessel.  No other significant vertebral artery stenoses are evident in the neck. The left vertebral artery origin is unremarkable.  The right common carotid artery demonstrates minimal distal irregularity.  There is calcified and noncalcified plaque along the posterior margin of the proximal right internal carotid artery. There is no significant stenosis relative to the distal segment. The remainder of the cervical right ICA is within normal limits.  The left internal carotid artery demonstrates calcifications at its origin without significant stenosis.  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  There is no reconstitution in the neck.  Moderate spondylosis of the cervical spine is most  evident at C6-7 with right greater than left osseous foraminal narrowing.  The soft tissues demonstrate inflammatory changes in the subcutaneous fat posterior to C1 and C2.  The lung apices are clear.   Review of the MIP images confirms the above findings.  IMPRESSION:  1.  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 There is no reconstitution in the neck. 2.  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 3.  High-grade stenosis of the proximal right vertebral artery, the dominant vessel. 4.  Atherosclerotic irregularity within the posterior proximal right internal carotid artery without significant stenosis relative to the distal vessel. 5.  Inflammatory changes within subcutaneous tissues of the posterior neck at the level of C1 and C2.  CTA HEAD  Findings:  The left insular and basal ganglia non hemorrhagic infarct is again noted.  There is mass effect with effacement of the left lateral ventricle and sulci along the insular cortex.  No significant midline shift is present.  No other acute infarct is evident.  White matter changes are again noted.  Contrast is present in the left internal carotid artery at the petrous segment.  This likely reflects reconstitution from the ophthalmic segment.  No cervical reconstitution is evident.  The vessel is of smaller caliber than on the right.  There are calcifications in the cavernous segment without an additional stenosis.  The cavernous right internal carotid artery is within normal limits.  The terminal left ICA is smaller than on the right.  The left M1 caliber is decreased relative to the right.  The A1 segments fill normally. The central MCA branches on the left are markedly attenuated, compatible with the area of infarction.  The right MCA bifurcation and branch vessels are within normal limits. The dural sinuses fill normally.  The ACA branch vessels are unremarkable.  The right vertebral artery is  the dominant vessel.  Both PICA origins are visualized and normal.  The basilar artery is within normal limits.  Both posterior cerebral arteries originate from the basilar tip.  The PCA branch vessels are unremarkable.  The dural sinuses are patent.   Review of the MIP images confirms the above findings.  IMPRESSION:  1.  Reconstitution of the left internal carotid artery with contrast first seen in the petrous segment.  Reconstitution is likely at the ophthalmic level. 2.  Markedly decreased caliber of the left M1 segment with significant attenuation of medial MCA branch vessels compatible with area of infarction. 3.  Mild small vessel disease otherwise.  4.  Similar appearance of non hemorrhagic infarct involving the left basal ganglia and insular cortex.  Original Report Authenticated By: Jamesetta Orleans. MATTERN, M.D.    Medications: I have reviewed the patient's current medications.   Physical exam GENERAL- alert HEAD- normal atraumatic, no neck masses, normal thyroid, no jvd RESPIRATORY- appears well, vitals normal, no respiratory distress, acyanotic, normal RR, ear and throat exam is normal, neck free of mass or lymphadenopathy, chest clear, no wheezing, crepitations, rhonchi, normal symmetric air entry CVS- regular rate and rhythm, S1, S2 normal, no murmur, click, rub or gallop ABDOMEN- abdomen is soft without significant tenderness, masses, organomegaly or guarding NEURO- dysarthric, limited mvt right side. EXTREMITIES- extremities normal, atraumatic, no cyanosis or edema  Plan .CVA (Acute cerebral infarction in the left middle cerebral artery territory involving left basal ganglia and left insula- slightly hemorrhagic) in setting of occluded ICA- Improving. Await CTA report. Appreciate neuro/cir input. Continue current mx. Follow cir.  .DM (diabetes mellitus), type 2, uncontrolled- better controlled. hb a1c 14.4. Continue SSI. continue Increase lantus gradually.  Hyperlipidemia- on  vytorin, continue. Marland KitchenHTN (hypertension), benign-controlled on current meds.  Oral thrush- DM predisposition. Seems clearing up. Continue fluconazole.Dvt/gi prophylaxis.  Condition closely guarded.      Tailer Volkert 01/01/2012 6:47 PM Pager: [phone].

## 2012-01-01 NOTE — Progress Notes (Addendum)
Clinical Social Work Department CLINICAL SOCIAL WORK PLACEMENT NOTE 01/01/2012  Patient:  Mario, [name]  Account Number:  [id number] Admit date:  12/28/2011  Clinical Social Worker:  Doree Albee  Date/time:  01/01/2012 05:45 PM  Clinical Social Work is seeking post-discharge placement for this patient at the following level of care:   SKILLED NURSING   (*CSW will update this form in Epic as items are completed)   01/01/2012  Patient/family provided with [facility] Department of Clinical Social Work's list of facilities offering this level of care within the geographic area requested by the patient (or if unable, by the patient's family).  01/01/2012  Patient/family informed of their freedom to choose among providers that offer the needed level of care, that participate in Medicare, Medicaid or managed care program needed by the patient, have an available bed and are willing to accept the patient.  01/01/2012  Patient/family informed of [facility]' ownership interest in [facility], as well as of the fact that they are under no obligation to receive care at this facility.  PASARR submitted to EDS on 01/01/2012 PASARR number received from EDS on   FL2 transmitted to all facilities in geographic area requested by pt/family on  01/01/2012 FL2 transmitted to all facilities within larger geographic area on   Patient informed that his/her managed care company has contracts with or will negotiate with  certain facilities, including the following:     Patient/family informed of bed offers received:  01/02/2012   Patient chooses bed at [facility] Physician recommends and patient chooses bed at    Patient to be transferred to [facility]   on  01/03/2012  Patient to be transferred to facility by [facility]  The following physician request were entered in Epic:   Additional Comments: Pt family unable to complete paperwork for pt at [facility] until  tomorrow.   Catha Gosselin, Theresia Majors  [phone] .01/01/2012 1747pm

## 2012-01-01 NOTE — Progress Notes (Signed)
History: [name] was transferred from Med [facility] after fall on the porch. History not clear. He was found to have dysarthric speech and right sided weakness. Per EMS report, this is baseline.  CT brain did not show a bleed. As he was discovered on the porch in the morning, with him last said to be normal last night, he was outside tpa window, hence code stroke not called at med center. Patient attempts to speak but he is dysarthric and difficult to understand but follows instructions.  LSN:bed time 4/10/123 tPA Given: no, outside window Level of Consciousness (1a. ): Alert, keenly responsive (04/14 2018) LOC Questions (1b. ): Answers neither question correctly (04/14 2018) LOC Commands (1c. ): Performs both tasks correctly (04/14 2018) Best Gaze (2. ): Normal (04/14 2018) Visual (3. ): No visual loss (04/14 2018) Facial Palsy (4. ): Minor paralysis (04/14 2018) Motor Arm, Left (5a. ): No drift (04/14 2018) Motor Arm, Right (5b. ): Some effort against gravity (04/14 2018) Motor Leg, Left (6a. ): No drift (04/14 2018) Motor Leg, Right (6b. ): No drift (04/14 2018) Limb Ataxia (7. ): Absent (04/14 2018) Sensory (8. ): Normal, no sensory loss (04/14 2018) Best Language (9. ): Severe aphasia (04/14 2018) Dysarthria (10. ): Severe dysarthria, patient's speech is so slurred as to be unintelligible in the absence of or out of proportion to any dysphasia, or is mute/anarthric (04/14 2018) Inattention/Extinction: No Abnormality (04/14 2018) Total: 9  (04/14 2018)  Subjective: Alert, sitting up in bed.     Objective: BP 124/60  Pulse 56  Temp(Src) 97.2 F (36.2 C) (Oral)  Resp 18  Ht 5\' 3"  (1.6 m)  Wt 68.04 kg (150 lb)  BMI 26.57 kg/m2  SpO2 96%  CBGs  Basename 01/01/12 1137 01/01/12 0703 12/31/11 2257 12/31/11 1627 12/31/11 1143 12/31/11 0446 12/31/11 0041 12/30/11 2224 12/30/11 1641 12/30/11 1142  GLUCAP 248* 179* 139* 292* 234* 177* 179* 188* 218* 258*    Diet: D1,  honey thick  Activity: up with assistance  DVT Prophylaxis: SCDs   Medications: Scheduled:   . aspirin  300 mg Rectal Daily   Or  . aspirin  325 mg Oral Daily  . enoxaparin (LOVENOX) injection  40 mg Subcutaneous Q24H  . ezetimibe-simvastatin  1 tablet Oral QHS  . fluconazole (DIFLUCAN) IV  100 mg Intravenous Q24H  . fluconazole (DIFLUCAN) IV  200 mg Intravenous Once  . insulin aspart  0-9 Units Subcutaneous TID WC  . insulin glargine  7 Units Subcutaneous Daily  . pantoprazole  40 mg Oral QHS  . sodium chloride  500 mL Intravenous Once  . DISCONTD: irbesartan  75 mg Oral Daily   Neurologic Exam: Mental Status: Alert.  Attempts to conversate, but nonfluent speech    Able to follow 3 step commands without difficulty. Cranial Nerves: II- Visual fields grossly intact. III/IV/VI-Extraocular movements intact.  Pupils reactive bilaterally. V/VII-Smile symmetric VIII-hearing grossly intact IX/X-normal gag XI-bilateral shoulder shrug XII-midline tongue extension Motor: 5/5 LUE/ LLE.  with normal tone and  3/5 right hemiparesis   Sensory: Light touch intact throughout, bilaterally Deep Tendon Reflexes: 2+ and symmetric throughout Plantars: upgoing on right, downgoing on left. Cerebellar: Normal finger-to-nose, normal rapid alternating movements normal on left.   Lab Results: Basic Metabolic Panel:  Lab 01/01/12 1610 12/29/11 1437  NA 135 136  K 3.7 4.4  CL 103 104  CO2 23 23  GLUCOSE 179* 256*  BUN 14 11  CREATININE 0.83 0.80  CALCIUM  9.0 8.9  MG -- --  PHOS -- --   Liver Function Tests:  Lab 01/01/12 0512 12/29/11 1437  AST 16 19  ALT 9 8  ALKPHOS 75 83  BILITOT 0.3 0.3  PROT 6.0 6.1  ALBUMIN 2.8* 2.9*   CBC:  Lab 01/01/12 0512 12/29/11 0620  WBC 6.5 6.4  NEUTROABS -- --  HGB 13.2 12.6*  HCT 38.2* 36.8*  MCV 78.8 80.0  PLT 133* 157   Cardiac Enzymes:  Lab 12/28/11 1005  CKTOTAL --  CKMB --  CKMBINDEX --  TROPONINI <0.30   CBG:  Lab 01/01/12  1137 01/01/12 0703 12/31/11 2257 12/31/11 1627 12/31/11 1143 12/31/11 0446  GLUCAP 248* 179* 139* 292* 234* 177*   Hemoglobin A1C:  Lab 12/29/11 1437  HGBA1C 14.4*   Fasting Lipid Panel:  Lab 12/30/11 0738  CHOL 174  HDL 42  LDLCALC 85  TRIG 235*  CHOLHDL 4.1  LDLDIRECT --   Thyroid Function Tests:  Lab 12/29/11 0620  TSH 1.374  T4TOTAL --  FREET4 --  T3FREE --  THYROIDAB --   Coagulation:  Lab 12/28/11 1005  LABPROT 12.2  INR 0.89    Alcohol Level:  Lab 12/28/11 1005  ETH <11    Study Results: 12/28/2011 CHEST - 2 VIEW  Findings: Prior CABG. Heart and mediastinal contours are within normal limits.  No focal opacities or effusions.  No acute bony abnormality.  IMPRESSION: No active cardiopulmonary disease.   Cyndie Chime, M.D.   12/28/2011  CT HEAD WITHOUT CONTRAST   Findings: No intracranial hemorrhage.  No parenchymal contusion. No midline shift or mass effect.  Basilar cisterns are patent. No skull base fracture.  No fluid in the paranasal sinuses or mastoid air cells.  There is periventricular white matter hypodensities.  There is generalized cortical atrophy.  IMPRESSION: No intracranial trauma.  Atrophy and microvascular disease.  Genevive Bi, M.D.   CT CERVICAL SPINE  Findings: There is reversal of normal cervical lordosis.  No prevertebral soft tissue swelling.  The there is endplate osteophytosis joint space narrowing at C6-C7.  No loss vertebral body height q. leak.  Normal facet to dilation.  Normal precervical junction.  There is some stranding within the subcutaneous tissues posterior X of the occipital lobe and C2 vertebral body (image 30)  No epidural hematoma is evident.  IMPRESSION:  1.  No evidence of acute cervical spine fracture. 2.  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 3.  Subcutaneous stranding posterior to the C2 vertebral body may relate to trauma. 4. Straightening of the normal cervical lordosis may be secondary to position, muscle  spasm, or ligamentous injury.  Genevive Bi, M.D.   12/30/2011   MRA NECK WITHOUT CONTRAST Findings:  The arch is not evaluated.  Flow is antegrade in the vertebral arteries bilaterally.  The right vertebral artery is dominant to the left.  There is some signal loss, suggesting stenosis, in the proximal right vertebral artery. The left vertebral artery origin appears to be intact.  No other significant stenoses are present.  The right common carotid artery is within normal limits.  The proximal right internal carotid artery demonstrates mild irregularity without significant stenosis relative to the distal vessel.  The left common carotid artery demonstrates some distal irregularity without significant stenosis.  There is no significant flow within the left internal carotid artery.  IMPRESSION:  1.  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 2.  Probable focal stenosis of the proximal  right vertebral artery, the dominant vessel. 3.  Mild irregularity of the proximal right internal carotid artery without significant stenosis relative to the distal vessel. CHRISTOPHER W. MATTERN, M.D.   12/29/2011   MRI HEAD WITHOUT CONTRAST Findings:  Acute infarct in the left basal ganglia involving the head of the caudate, anterior limb internal capsule, and putamen. Acute infarct extends into the left insular cortex. There is T1 shortening within the infarct suggesting mild hemorrhage.  No other acute infarct.  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  Mild chronic microvascular ischemic change in the white matter. Negative for mass lesion.  Ventricle size is normal.  No midline shift.  IMPRESSION: Acute infarct in the left middle cerebral artery territory involving left basal ganglia and left insula.  This appears to be a slightly hemorrhagic infarct.  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  Camelia Phenes, M.D.   12/29/11 MRA HEAD  Findings: Both vertebral arteries are patent  to the basilar. Basilar, superior cerebellar, and posterior cerebral arteries are patent bilaterally.  PICA is patent bilaterally.  Small right PICA is patent.  Left internal carotid artery is patent. There is very little signal in the left middle cerebral artery branches which is likely due to slow flow as well as occlusion of multiple branches.  Right internal carotid artery is widely patent.  Both anterior cerebral arteries are supplied from the right and are patent. Right middle cerebral artery is widely patent.  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   Camelia Phenes, M.D.   12/29/11 2 D ECHO- Left ventricle: The cavity size was normal. Wall thickness was normal. Systolic function was normal. The estimated ejection fraction was in the range of 60% to 65%.  12/30/11 Carotid Duplex  Right internal carotid artery demonstrates elevated velocities in the 60-79% range with no evidence of significant plaque formation. The right vertebral artery has elevated velocities with antegrade flow. The left internal carotid artery demonstrates high resistance waveforms with no plaque morphology. The left vertebral artery was not insonated. 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  Ct Angio Neck W/cm &/or Wo Cm. 12/31/2011  1.  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 There is no reconstitution in the neck. 2.  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 3.  High-grade stenosis of the proximal right vertebral artery, the dominant vessel. 4.  Atherosclerotic irregularity within the posterior proximal right internal carotid artery without significant stenosis relative to the distal vessel. 5.  Inflammatory changes within subcutaneous tissues of the posterior neck at the level of C1 and C2.    CTA HEAD 1.  Reconstitution of the left internal  carotid artery with contrast first seen in the petrous segment.  Reconstitution is likely at the ophthalmic level. 2.  Markedly decreased caliber of the left M1 segment with significant attenuation of medial MCA branch vessels compatible with area of infarction. 3.  Mild small vessel disease otherwise.  4.  Similar appearance of non hemorrhagic infarct involving the left basal ganglia and insular cortex.     Therapies: PT: Difficulty walking;Hemiplegia dominant side. Recommend rehab consult.   ZO:[id number]  SLP: Moderate oral phase dysphagia;Moderate pharyngeal phase dysphagia.  Dysphagia 1(Puree);  Honey-thick liquid.  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  Hypertension- controlled Diabetes Mellitus-uncontrolled;  A1 C 14.4 Hyperlipidemia- not at goal <80.  LDL 85  Plan:  -Aspirin 81 mg and Plavix - dose 75 mg daily x 3 months then one alone - Aggressive Risk Factor modification -Stroke team will sign off.  LOS: 4 days   SHARON BIBY, AVNP, ANP-BC, GNP-BC [facility] Pager: [phone] 01/01/2012 1:16 PM  Dr. Delia Heady, Stroke Center Medical Director, has personally reviewed chart, pertinent data, examined the patient and developed the plan of care.

## 2012-01-01 NOTE — Progress Notes (Signed)
Occupational Therapy Treatment Patient Details Name: [name] MRN: [id number] DOB: [birth year] Today's Date: 01/01/2012  OT Assessment/Plan OT Assessment/Plan Comments on Treatment Session: Excellent participation. Pt with improved motor return RUE with incrase in shoulder strength . 2/5. elbow 3/5. 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 Apparent R field cut. Pt with difficulty with tracking to R. Poor spatial awareness on R. Responds to cues for head turns right to view R visual field.  Excellent rehab candidate. Family in to visit this pm after session. Daughter states she wants him to go to rehab. OT Plan: Discharge plan remains appropriate OT Frequency: Min 2X/week Recommendations for Other Services: Rehab consult Follow Up Recommendations: Inpatient Rehab Equipment Recommended: Defer to next venue OT Goals Acute Rehab OT Goals OT Goal Formulation: With patient Time For Goal Achievement: 2 weeks ADL Goals Pt Will Perform Eating: with supervision;Sitting, chair;with cueing (comment type and amount) ADL Goal: Eating - Progress: Progressing toward goals Pt Will Perform Grooming: Supported;with cueing (comment type and amount);Standing at sink;with supervision ADL Goal: Grooming - Progress: Progressing toward goals Pt Will Perform Upper Body Bathing: with supervision;Sitting at sink;Supported;with cueing (comment type and amount) ADL Goal: Upper Body Bathing - Progress: Progressing toward goals Pt Will Perform Lower Body Bathing: with min assist;Supported;Sit to stand from chair;Sitting at sink;with cueing (comment type and amount) ADL Goal: Lower Body Bathing - Progress: Progressing toward goals Pt Will Transfer to Toilet: with min assist;Ambulation;3-in-1;with cueing (comment type and amount) ADL Goal: Toilet Transfer - Progress: Progressing toward goals Pt Will Perform Toileting - Hygiene: with supervision;Standing at 3-in-1/toilet ADL Goal: Toileting - Hygiene - Progress:  Progressing toward goals Arm Goals Additional Arm Goal #1: Complete sROM RUE with min cues. Arm Goal: Additional Goal #1 - Progress: Progressing toward goals  OT Treatment Precautions/Restrictions  Precautions Precautions: Fall Restrictions Weight Bearing Restrictions: No   ADL ADL ADL Comments: Pt recognized this therapist on entry to room.Pt appears to demonstrate apraxia, but attempts to use RUE in functional tasks. Mobility  Transfers Transfers: Yes Sit to Stand: 4: Min assist;From chair/3-in-1 Stand to Sit: 4: Min assist;To chair/3-in-1;With upper extremity assist;Other (comment) (tactile cues for safety) Exercises General Exercises - Upper Extremity Shoulder Flexion: Right (PNF patterns) Elbow Flexion: AROM;Strengthening;Right;10 reps;Supine Elbow Extension: AROM;10 reps;Supine;Other (comment) (against gravity.) Wrist Flexion: Strengthening;Right;5 reps Wrist Extension: AROM;Right;5 reps Digit Composite Flexion: Other (comment) (gross grasp/release with extensor lag)  [facility] rehab LUE using PNF patterns and rhythmic stabilization. Ambulating in hall using modified 3 musketeer hold.  End of Session OT - End of Session Equipment Utilized During Treatment: Gait belt Activity Tolerance: Patient tolerated treatment well Patient left: in chair;with call bell in reach General Behavior During Session: [facility] for tasks performed Cognition: Impaired Cognitive Impairment: improved attention. Decrased safety awareness during transfers.  Cashlynn Yearwood,HILLARY  01/01/2012, 5:34 PM [facility], OTR/L  [phone] 01/01/2012

## 2012-01-01 NOTE — Progress Notes (Signed)
Inpatient Diabetes Program Recommendations  AACE/ADA: New Consensus Statement on Inpatient Glycemic Control  Target Ranges:  Prepandial:   less than 140 mg/dL      Peak postprandial:   less than 180 mg/dL (1-2 hours)      Critically ill patients:  140 - 180 mg/dL  Pager:  [phone] Hours:  8 am-10pm   Reason for Visit: Elevated glucose and HbgA1C: 14.4%  Results for [name], [name] (MRN [id number]) as of 01/01/2012 12:22  Ref. Range 12/31/2011 04:46 12/31/2011 11:43 12/31/2011 16:27 12/31/2011 22:57 01/01/2012 07:03 01/01/2012 11:37  Glucose-Capillary Latest Range: 70-99 mg/dL 562 (H) 130 (H) 865 (H) 139 (H) 179 (H) 248 (H)    Inpatient Diabetes Program Recommendations Insulin - Basal: Increase Lantus to 10 units daily Insulin - Meal Coverage: Add Novolog 3 units TID to cover [facility] Outpatient Referral: Add outpatient diabetes education for patient once out of rehab

## 2012-01-01 NOTE — Progress Notes (Signed)
Pt had a fall this afternoon at around 2:35 pm. He was relaxing in the recliner after working with PT. Went to check the patient 5 mts before of his fall, call bell was given to call for any help. Found him sitting on the floor right in front of the chair, checked whole body for any defect, no problems. He expressed doesn't hurt any where. Took him back in chair. Contacted physician and family.

## 2012-01-01 NOTE — Consult Note (Signed)
Physical Medicine and Rehabilitation Consult Reason for Consult: Stroke Referring Phsyician: Triad KAELEB EMOND is an 71 y.o. male.   HPI: 71 year old right-handed male with history of stroke and questionable right-sided residual admitted April 11 with aphasia and right-sided weakness after a fall on the porch per wife. Question patient's baseline prior to incident. MRI of the brain revealed acute infarct left middle cerebral artery territory involving the left basal ganglia and left insula. MRA of the head revealed occlusion of the left internal carotid artery and multiple branches of the left middle cerebral artery. Echocardiogram with ejection fraction of 65% without emboli. 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 Neurology consulted placed on aspirin and subcutaneous Lovenox. Speech therapy swallow study placed on a dysphagia 1 honey thick liquid diet. Findings of hemoglobin A1c of 14.4 placed on sliding scale insulin as well as Lantus. Physical occupational therapy recommending physical medicine rehabilitation consult to consider inpatient rehabilitation services Review of Systems  Unable to perform ROS: language  All other systems reviewed and are negative.   Past Medical History  Diagnosis Date  . Myocardial infarction    History reviewed. No pertinent past surgical history. History reviewed. No pertinent family history. Social History:  has an unknown smoking status. He does not have any smokeless tobacco history on file. He reports that he does not drink alcohol or use illicit drugs. Allergies: No Known Allergies Medications Prior to Admission  Medication Dose Route Frequency Provider Last Rate Last Dose  . 0.9 %  sodium chloride infusion   Intravenous STAT Lyanne Co, MD 125 mL/hr at 12/28/11 1449    . acetaminophen (TYLENOL)  tablet 650 mg  650 mg Oral Q4H PRN Simbiso Ranga, MD       Or  . acetaminophen (TYLENOL) suppository 650 mg  650 mg Rectal Q4H PRN Simbiso Ranga, MD      . aspirin suppository 300 mg  300 mg Rectal Daily Simbiso Ranga, MD       Or  . aspirin tablet 325 mg  325 mg Oral Daily Simbiso Ranga, MD   325 mg at 12/31/11 1216  . enoxaparin (LOVENOX) injection 40 mg  40 mg Subcutaneous Q24H Benny Lennert, PHARMD   40 mg at 12/31/11 1710  . ezetimibe-simvastatin (VYTORIN) 10-40 MG per tablet 1 tablet  1 tablet Oral QHS Simbiso Ranga, MD   1 tablet at 12/31/11 2300  . fluconazole (DIFLUCAN) IVPB 100 mg  100 mg Intravenous Q24H Simbiso Ranga, MD      . fluconazole (DIFLUCAN) IVPB 200 mg  200 mg Intravenous Once Simbiso Ranga, MD   200 mg at 12/31/11 1217  . food thickener (THICK IT) powder   Oral PRN Simbiso Ranga, MD      . hydrALAZINE (APRESOLINE) injection 5 mg  5 mg Intravenous Q8H PRN Simbiso Ranga, MD      . insulin aspart (novoLOG) injection 0-9 Units  0-9 Units Subcutaneous TID WC Simbiso Ranga, MD   5 Units at 12/31/11 1709  . insulin glargine (LANTUS) injection 7 Units  7 Units Subcutaneous Daily Simbiso Ranga, MD   7 Units at 12/31/11 1217  . iohexol (OMNIPAQUE) 350 MG/ML injection 100 mL  100 mL Intravenous Once PRN Medication Radiologist, MD   100 mL at 12/31/11 0422  . irbesartan (AVAPRO) tablet 75 mg  75 mg Oral Daily Simbiso Ranga,  MD      . ondansetron (ZOFRAN) injection 4 mg  4 mg Intravenous Q6H PRN Simbiso Ranga, MD      . pantoprazole (PROTONIX) EC tablet 40 mg  40 mg Oral QHS Herby Abraham, PHARMD   40 mg at 12/31/11 2200  . senna-docusate (Senokot-S) tablet 1 tablet  1 tablet Oral QHS PRN Simbiso Ranga, MD      . sodium chloride 0.9 % bolus 1,000 mL  1,000 mL Intravenous Once Lyanne Co, MD   1,000 mL at 12/28/11 1118  . sodium chloride 0.9 % bolus 1,000 mL  1,000 mL Intravenous Once Lyanne Co, MD   1,000 mL at 12/28/11 1237  . DISCONTD: 0.9 %  sodium chloride infusion    Intravenous Continuous Simbiso Ranga, MD 50 mL/hr at 12/29/11 1601    . DISCONTD: enoxaparin (LOVENOX) injection 40 mg  40 mg Subcutaneous QHS Simbiso Ranga, MD   40 mg at 12/29/11 2250  . DISCONTD: enoxaparin (LOVENOX) injection 40 mg  40 mg Subcutaneous Q24H Benny Lennert, PHARMD      . DISCONTD: enoxaparin (LOVENOX) injection 40 mg  40 mg Subcutaneous Q24H Benny Lennert, PHARMD      . DISCONTD: insulin aspart (novoLOG) injection 0-9 Units  0-9 Units Subcutaneous Q4H Simbiso Ranga, MD   2 Units at 12/30/11 0430  . DISCONTD: insulin glargine (LANTUS) injection 4 Units  4 Units Subcutaneous Daily Simbiso Ranga, MD   4 Units at 12/30/11 1210  . DISCONTD: insulin glargine (LANTUS) injection 7 Units  7 Units Subcutaneous Daily Simbiso Ranga, MD      . DISCONTD: pantoprazole (PROTONIX) injection 40 mg  40 mg Intravenous Q24H Simbiso Ranga, MD   40 mg at 12/28/11 2120   No current outpatient prescriptions on file as of 01/01/2012.    Home: Home Living Lives With: Spouse Available Help at Discharge: Family Type of Home: House Home Layout: One level Bathroom Shower/Tub: Engineer, manufacturing systems: Standard Bathroom Accessibility: Yes How Accessible: Accessible via walker Additional Comments: wife available 24/7 but can not physically lift pt  Functional History: Prior Function Able to Take Stairs?: Yes Driving: Yes Vocation: Retired Comments: Unable to assess due to pt aphasic and family not present. Functional Status:  Mobility: Bed Mobility Bed Mobility: Yes Supine to Sit: 4: Min assist;HOB elevated (Comment degrees) Supine to Sit Details (indicate cue type and reason): Assist to bring trunk up. Sitting - Scoot to Edge of Bed: 4: Min assist Sitting - Scoot to [place] of Bed Details (indicate cue type and reason): assist for balance. Transfers Sit to Stand: 3: Mod assist;With upper extremity assist;From bed Sit to Stand Details (indicate cue type and reason): R lat lean  at times Stand to Sit: 4: Min assist;With upper extremity assist;With armrests;To chair/3-in-1 Stand to Sit Details: assist to control descent Stand Pivot Transfers: 3: Mod assist Stand Pivot Transfer Details (indicate cue type and reason): Pivoted to left.  Pt with posterior and left lean and with shuffling pivotal steps.      ADL: ADL Eating/Feeding: Simulated;Moderate assistance Where Assessed - Eating/Feeding: Edge of bed Grooming: Performed;Moderate assistance Grooming Details (indicate cue type and reason): apraxia noted Where Assessed - Grooming: Sitting, bed;Unsupported Upper Body Bathing: Performed;Moderate assistance Where Assessed - Upper Body Bathing: Sitting, bed;Unsupported Lower Body Bathing: Performed;Maximal assistance Lower Body Bathing Details (indicate cue type and reason): leans R at times. gestural cues to increase midline orientation Where Assessed - Lower Body Bathing: Supported;Sit to stand from  bed Upper Body Dressing: Performed;Maximal assistance Where Assessed - Upper Body Dressing: Sitting, bed;Unsupported Lower Body Dressing: Performed;Maximal assistance Where Assessed - Lower Body Dressing: Sit to stand from bed;Supported Toilet Transfer: Simulated;Maximal assistance Toilet Transfer Method: Stand pivot Toileting - Clothing Manipulation: Maximal assistance Where Assessed - Toileting Clothing Manipulation: Standing Toileting - Hygiene: Not assessed Tub/Shower Transfer: Not assessed Ambulation Related to ADLs: Mod A RW level ADL Comments: Apparent praxia. does better with gestural cues and initiating functional patterrn  Cognition: Cognition Overall Cognitive Status: Impaired Arousal/Alertness: Awake/alert Orientation Level: Other (Comment) Attention: Focused;Sustained Focused Attention: Appears intact Sustained Attention: Impaired Sustained Attention Impairment: Verbal basic;Functional basic Awareness: Impaired Awareness Impairment: Intellectual  impairment Problem Solving: Impaired Problem Solving Impairment: Verbal basic Behaviors: Restless;Perseveration Safety/Judgment: Impaired Cognition Arousal/Alertness: Awake/alert Overall Cognitive Status: Impaired Orientation Level: Other (Comment) Following Commands: Follows one step commands inconsistently Safety/Judgement: Decreased awareness of safety precautions Decreased Safety/Judgement: Decreased awareness of need for assistance Safety/Judgement - Other Comments: ` Cognition - Other Comments: Unable to fully assess due to aphasia.  Blood pressure 135/54, pulse 52, temperature 98.6 F (37 C), temperature source Oral, resp. rate 20, height 5\' 3"  (1.6 m), weight 68.04 kg (150 lb), SpO2 98.00%. Physical Exam  Vitals reviewed. Constitutional: He appears well-developed.  Neck: Normal range of motion. Neck supple. No thyromegaly present.  Cardiovascular: Regular rhythm.   Pulmonary/Chest: Breath sounds normal. He has no wheezes.  Abdominal: He exhibits no distension. There is no tenderness.  Musculoskeletal: He exhibits no edema.  Neurological: He is alert.       Patient is aphasic. He would follow inconsistent one-step commands. Makes good eye contact with examiner. Could not identify a picture when given cues. Speech is severely dysarthric as well. The right central seventh tongue deviation. Strength in the right upper extremity grossly 2+5 prerectal lower extremity grossly 2+ to 3+ out of 5 and consistent. He did not withdraw to pain stimulation on the right arm or leg. Does report some right. Strength and sensation appear to be functional in the last.  Skin: Skin is warm and dry.  Psychiatric: He has a normal mood and affect.    Results for orders placed during the hospital encounter of 12/28/11 (from the past 24 hour(s))  GLUCOSE, CAPILLARY     Status: Abnormal   Collection Time   12/31/11 11:43 AM      Component Value Range   Glucose-Capillary 234 (*) 70 - 99 (mg/dL)    GLUCOSE, CAPILLARY     Status: Abnormal   Collection Time   12/31/11  4:27 PM      Component Value Range   Glucose-Capillary 292 (*) 70 - 99 (mg/dL)  GLUCOSE, CAPILLARY     Status: Abnormal   Collection Time   12/31/11 10:57 PM      Component Value Range   Glucose-Capillary 139 (*) 70 - 99 (mg/dL)  CBC     Status: Abnormal   Collection Time   01/01/12  5:12 AM      Component Value Range   WBC 6.5  4.0 - 10.5 (K/uL)   RBC 4.85  4.22 - 5.81 (MIL/uL)   Hemoglobin 13.2  13.0 - 17.0 (g/dL)   HCT 16.1 (*) 09.6 - 52.0 (%)   MCV 78.8  78.0 - 100.0 (fL)   MCH 27.2  26.0 - 34.0 (pg)   MCHC 34.6  30.0 - 36.0 (g/dL)   RDW 04.5  40.9 - 81.1 (%)   Platelets 133 (*) 150 - 400 (K/uL)  Ct Angio Head W/cm &/or Wo Cm  12/31/2011  *RADIOLOGY REPORT*  Clinical Data:  The no acute the left MCA territory infarct.  The left internal carotid artery occlusion.  CT ANGIOGRAPHY HEAD AND NECK  Technique:  Multidetector CT imaging of the head and neck was performed using the standard protocol during bolus administration of intravenous contrast.  Multiplanar CT image reconstructions including MIPs were obtained to evaluate the vascular anatomy. Carotid stenosis measurements (when applicable) are obtained utilizing NASCET criteria, using the distal internal carotid diameter as the denominator.  Contrast: OMNIPAQUE IOHEXOL 350 MG/ML SOLN,  Comparison:  MRI brain and MRA head 12/29/2011.  Noncontrast MRA neck 12/30/2011.  CTA NECK  Findings:  A standard three-vessel arch configuration is present. Dense calcifications are present at the origins of the great vessels.  There is severe stenosis of the proximal left subclavian artery proximal to the vertebral artery takeoff.  The lumen is narrowed to 2 mm, relative to a more distal segment of 7.5 mm.  The calculated stenosis is 70%.  The vertebral artery origins are both well visualized.  There is a high-grade stenosis of the proximal right vertebral artery relative to the  more distal vessel.  The right vertebral artery is than the dominant vessel.  No other significant vertebral artery stenoses are evident in the neck. The left vertebral artery origin is unremarkable.  The right common carotid artery demonstrates minimal distal irregularity.  There is calcified and noncalcified plaque along the posterior margin of the proximal right internal carotid artery. There is no significant stenosis relative to the distal segment. The remainder of the cervical right ICA is within normal limits.  The left internal carotid artery demonstrates calcifications at its origin without significant stenosis.  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  There is no reconstitution in the neck.  Moderate spondylosis of the cervical spine is most evident at C6-7 with right greater than left osseous foraminal narrowing.  The soft tissues demonstrate inflammatory changes in the subcutaneous fat posterior to C1 and C2.  The lung apices are clear.   Review of the MIP images confirms the above findings.  IMPRESSION:  1.  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 There is no reconstitution in the neck. 2.  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 3.  High-grade stenosis of the proximal right vertebral artery, the dominant vessel. 4.  Atherosclerotic irregularity within the posterior proximal right internal carotid artery without significant stenosis relative to the distal vessel. 5.  Inflammatory changes within subcutaneous tissues of the posterior neck at the level of C1 and C2.  CTA HEAD  Findings:  The left insular and basal ganglia non hemorrhagic infarct is again noted.  There is mass effect with effacement of the left lateral ventricle and sulci along the insular cortex.  No significant midline shift is present.  No other acute infarct is evident.  White matter changes are again noted.  Contrast is present in the left  internal carotid artery at the petrous segment.  This likely reflects reconstitution from the ophthalmic segment.  No cervical reconstitution is evident.  The vessel is of smaller caliber than on the right.  There are calcifications in the cavernous segment without an additional stenosis.  The cavernous right internal carotid artery is within normal limits.  The terminal left ICA is smaller than on the right.  The left M1 caliber is decreased relative  to the right.  The A1 segments fill normally. The central MCA branches on the left are markedly attenuated, compatible with the area of infarction.  The right MCA bifurcation and branch vessels are within normal limits. The dural sinuses fill normally.  The ACA branch vessels are unremarkable.  The right vertebral artery is the dominant vessel.  Both PICA origins are visualized and normal.  The basilar artery is within normal limits.  Both posterior cerebral arteries originate from the basilar tip.  The PCA branch vessels are unremarkable.  The dural sinuses are patent.   Review of the MIP images confirms the above findings.  IMPRESSION:  1.  Reconstitution of the left internal carotid artery with contrast first seen in the petrous segment.  Reconstitution is likely at the ophthalmic level. 2.  Markedly decreased caliber of the left M1 segment with significant attenuation of medial MCA branch vessels compatible with area of infarction. 3.  Mild small vessel disease otherwise.  4.  Similar appearance of non hemorrhagic infarct involving the left basal ganglia and insular cortex.  Original Report Authenticated By: Jamesetta Orleans. MATTERN, M.D.   Ct Angio Neck W/cm &/or Wo/cm  12/31/2011  *RADIOLOGY REPORT*  Clinical Data:  The no acute the left MCA territory infarct.  The left internal carotid artery occlusion.  CT ANGIOGRAPHY HEAD AND NECK  Technique:  Multidetector CT imaging of the head and neck was performed using the standard protocol during bolus administration  of intravenous contrast.  Multiplanar CT image reconstructions including MIPs were obtained to evaluate the vascular anatomy. Carotid stenosis measurements (when applicable) are obtained utilizing NASCET criteria, using the distal internal carotid diameter as the denominator.  Contrast: OMNIPAQUE IOHEXOL 350 MG/ML SOLN,  Comparison:  MRI brain and MRA head 12/29/2011.  Noncontrast MRA neck 12/30/2011.  CTA NECK  Findings:  A standard three-vessel arch configuration is present. Dense calcifications are present at the origins of the great vessels.  There is severe stenosis of the proximal left subclavian artery proximal to the vertebral artery takeoff.  The lumen is narrowed to 2 mm, relative to a more distal segment of 7.5 mm.  The calculated stenosis is 70%.  The vertebral artery origins are both well visualized.  There is a high-grade stenosis of the proximal right vertebral artery relative to the more distal vessel.  The right vertebral artery is than the dominant vessel.  No other significant vertebral artery stenoses are evident in the neck. The left vertebral artery origin is unremarkable.  The right common carotid artery demonstrates minimal distal irregularity.  There is calcified and noncalcified plaque along the posterior margin of the proximal right internal carotid artery. There is no significant stenosis relative to the distal segment. The remainder of the cervical right ICA is within normal limits.  The left internal carotid artery demonstrates calcifications at its origin without significant stenosis.  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  There is no reconstitution in the neck.  Moderate spondylosis of the cervical spine is most evident at C6-7 with right greater than left osseous foraminal narrowing.  The soft tissues demonstrate inflammatory changes in the subcutaneous fat posterior to C1 and C2.  The lung apices are clear.   Review of the MIP images confirms the above  findings.  IMPRESSION:  1.  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 There is no reconstitution in the neck. 2.  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 3.  High-grade stenosis of the proximal right vertebral artery, the dominant vessel. 4.  Atherosclerotic irregularity within the posterior proximal right internal carotid artery without significant stenosis relative to the distal vessel. 5.  Inflammatory changes within subcutaneous tissues of the posterior neck at the level of C1 and C2.  CTA HEAD  Findings:  The left insular and basal ganglia non hemorrhagic infarct is again noted.  There is mass effect with effacement of the left lateral ventricle and sulci along the insular cortex.  No significant midline shift is present.  No other acute infarct is evident.  White matter changes are again noted.  Contrast is present in the left internal carotid artery at the petrous segment.  This likely reflects reconstitution from the ophthalmic segment.  No cervical reconstitution is evident.  The vessel is of smaller caliber than on the right.  There are calcifications in the cavernous segment without an additional stenosis.  The cavernous right internal carotid artery is within normal limits.  The terminal left ICA is smaller than on the right.  The left M1 caliber is decreased relative to the right.  The A1 segments fill normally. The central MCA branches on the left are markedly attenuated, compatible with the area of infarction.  The right MCA bifurcation and branch vessels are within normal limits. The dural sinuses fill normally.  The ACA branch vessels are unremarkable.  The right vertebral artery is the dominant vessel.  Both PICA origins are visualized and normal.  The basilar artery is within normal limits.  Both posterior cerebral arteries originate from the basilar tip.  The PCA branch vessels are unremarkable.  The dural sinuses are  patent.   Review of the MIP images confirms the above findings.  IMPRESSION:  1.  Reconstitution of the left internal carotid artery with contrast first seen in the petrous segment.  Reconstitution is likely at the ophthalmic level. 2.  Markedly decreased caliber of the left M1 segment with significant attenuation of medial MCA branch vessels compatible with area of infarction. 3.  Mild small vessel disease otherwise.  4.  Similar appearance of non hemorrhagic infarct involving the left basal ganglia and insular cortex.  Original Report Authenticated By: Jamesetta Orleans. MATTERN, M.D.   Mr Angiogram Neck Wo Contrast  12/30/2011  *RADIOLOGY REPORT*  Clinical Data:  Left basal ganglia infarct.  Contrast was not given as there was extravasation during the intent at the junction.  A second injection was not attempted.  MRA NECK WITHOUT CONTRAST  Technique:  Angiographic images of the neck were obtained using MRA technique without intravenous contrast.  Carotid stenosis measurements (when applicable) are obtained utilizing NASCET criteria, using the distal internal carotid diameter as the denominator.  Comparison:  MRA head 12/29/2011.  Findings:  The arch is not evaluated.  Flow is antegrade in the vertebral arteries bilaterally.  The right vertebral artery is dominant to the left.  There is some signal loss, suggesting stenosis, in the proximal right vertebral artery. The left vertebral artery origin appears to be intact.  No other significant stenoses are present.  The right common carotid artery is within normal limits.  The proximal right internal carotid artery demonstrates mild irregularity without significant stenosis relative to the distal vessel.  The left common carotid artery demonstrates some distal irregularity without significant stenosis.  There is no significant flow within the left internal carotid artery.  IMPRESSION:  1.  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  2.  Probable focal  stenosis of the proximal right vertebral artery, the dominant vessel. 3.  Mild irregularity of the proximal right internal carotid artery without significant stenosis relative to the distal vessel.  Original Report Authenticated By: Jamesetta Orleans. MATTERN, M.D.    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 Does the need for close, 24 hr/day medical supervision in concert with the patient's rehab needs make it unreasonable for this patient to be served in a less intensive setting? Yes and Potentially 2. Co-Morbidities requiring supervision/potential complications: Hypertension, diabetes 3. Due to bladder management, bowel management, safety, skin/wound care, disease management, medication administration and patient education, does the patient require 24 hr/day rehab nursing? Yes 4. Does the patient require coordinated care of a physician, rehab nurse, PT (1-2 hrs/day, 5 days/week), OT (1-2 hrs/day, 5 days/week) and SLP (1-2 hrs/day, 5 days/week) to address physical and functional deficits in the context of the above medical diagnosis(es)? Yes Addressing deficits in the following areas: balance, endurance, locomotion, strength, transferring, bowel/bladder control, bathing, dressing, feeding, grooming, toileting, cognition, speech, language, swallowing and psychosocial support 5. Can the patient actively participate in an intensive therapy program of at least 3 hrs of therapy per day at least 5 days per week? Yes 6. The potential for patient to make measurable gains while on inpatient rehab is excellent 7. Anticipated functional outcomes upon discharge from inpatients are supervision to minimal assistance PT, minimal assistance OT, minimal to moderate assistance SLP 8. Estimated rehab length of stay to reach the above functional goals is: 3 weeks 9. Does the patient have adequate social supports to  accommodate these discharge functional goals? Yes 10. Anticipated D/C setting: Home 11. Anticipated post D/C treatments: HH therapy 12. Overall Rehab/Functional Prognosis: excellent  RECOMMENDATIONS: This patient's condition is appropriate for continued rehabilitative care in the following setting: CIR Patient has agreed to participate in recommended program. Yes Note that insurance prior authorization may be required for reimbursement for recommended care.  Comment:   Ivory Broad, MD 01/01/2012

## 2012-01-01 NOTE — Discharge Instructions (Signed)
STROKE/TIA DISCHARGE INSTRUCTIONS SMOKING Cigarette smoking nearly doubles your risk of having a stroke & is the single most alterable risk factor  If you smoke or have smoked in the last 12 months, you are advised to quit smoking for your health.  Most of the excess cardiovascular risk related to smoking disappears within a year of stopping.  Ask you doctor about anti-smoking medications  [place] Quit Line: 1-800-QUIT NOW  Free Smoking Cessation Classes [phone]  CHOLESTEROL Know your levels; limit fat & cholesterol in your diet  Lipid Panel     Component Value Date/Time   CHOL 174 12/30/2011 0738   TRIG 235* 12/30/2011 0738   HDL 42 12/30/2011 0738   CHOLHDL 4.1 12/30/2011 0738   VLDL 47* 12/30/2011 0738   LDLCALC 85 12/30/2011 0738      Many patients benefit from treatment even if their cholesterol is at goal.  Goal: Total Cholesterol (CHOL) less than 160  Goal:  Triglycerides (TRIG) less than 150  Goal:  HDL greater than 40  Goal:  LDL (LDLCALC) less than 100   BLOOD PRESSURE American Stroke Association blood pressure target is less that 120/80 mm/Hg  Your discharge blood pressure is:  BP: 135/54 mmHg  Monitor your blood pressure  Limit your salt and alcohol intake  Many individuals will require more than one medication for high blood pressure  DIABETES (A1c is a blood sugar average for last 3 months) Goal HGBA1c is under 7% (HBGA1c is blood sugar average for last 3 months)  Diabetes: {STROKE DC DIABETES:22357}    Lab Results  Component Value Date   HGBA1C 14.4* 12/29/2011     Your HGBA1c can be lowered with medications, healthy diet, and exercise.  Check your blood sugar as directed by your physician  Call your physician if you experience unexplained or low blood sugars.  PHYSICAL ACTIVITY/REHABILITATION Goal is 30 minutes at least 4 days per week    {STROKE DC ACTIVITY/REHAB:22359}  Activity decreases your risk of heart attack and stroke and makes your heart  stronger.  It helps control your weight and blood pressure; helps you relax and can improve your mood.  Participate in a regular exercise program.  Talk with your doctor about the best form of exercise for you (dancing, walking, swimming, cycling).  DIET/WEIGHT Goal is to maintain a healthy weight  Your discharge diet is: Dysphagia *** liquids Your height is:  Height: 5\' 3"  (160 cm) Your current weight is: Weight: 68.04 kg (150 lb) Your Body Mass Index (BMI) is:  BMI (Calculated): 26.6   Following the type of diet specifically designed for you will help prevent another stroke.  Your goal weight range is:  ***  Your goal Body Mass Index (BMI) is 19-24.  Healthy food habits can help reduce 3 risk factors for stroke:  High cholesterol, hypertension, and excess weight.  RESOURCES Stroke/Support Group:  Call [phone]  they meet the 3rd Sunday of the month on the Rehab Unit at [facility], [place] ( no meetings June, July & Aug).  STROKE EDUCATION PROVIDED/REVIEWED AND GIVEN TO PATIENT Stroke warning signs and symptoms How to activate emergency medical system (call 911). Medications prescribed at discharge. Need for follow-up after discharge. Personal risk factors for stroke. Pneumonia vaccine given:   {STROKE DC YES/NO/DATE:22363} Flu vaccine given:   {STROKE DC YES/NO/DATE:22363} My questions have been answered, the writing is legible, and I understand these instructions.  I will adhere to these goals & educational materials that have been provided  to me after my discharge from the hospital.

## 2012-01-01 NOTE — Progress Notes (Signed)
Physical Therapy Treatment Patient Details Name: [name] MRN: [id number] DOB: 1940-09-28 Today's Date: 01/01/2012  PT Assessment/Plan  PT - Assessment/Plan Comments on Treatment Session: 71 y.o. male with L CVA is progressing well with therapy and was able to tolerate ambulation into the hallway today.  He presents with significant deficits in strength, balance and cognition and would be an excellent inpatient rehabilitation candidate.   PT Plan: Discharge plan remains appropriate;Frequency remains appropriate PT Frequency: Min 4X/week Follow Up Recommendations: Inpatient Rehab Equipment Recommended: Defer to next venue PT Goals  Acute Rehab PT Goals PT Goal: Supine/Side to Sit - Progress: Met PT Goal: Sit to Stand - Progress: Progressing toward goal PT Goal: Stand to Sit - Progress: Progressing toward goal PT Goal: Ambulate - Progress: Progressing toward goal  PT Treatment Precautions/Restrictions  Precautions Precautions: Fall Precaution Comments: expressive aphasia Restrictions Weight Bearing Restrictions: No Mobility (including Balance) Bed Mobility Supine to Sit: 6: Modified independent (Device/Increase time);HOB flat;With rails Supine to Sit Details (indicate cue type and reason): took multiple attempts to sit up, but patient was able to do it without outside assistance.   Sitting - Scoot to Edge of Bed: 6: Modified independent (Device/Increase time);With rail Sitting - Scoot to Edge of Bed Details (indicate cue type and reason): patient using momentum to throw body weight instead of alternating weight shifting at hips.   Transfers Sit to Stand: 3: Mod assist;From bed;From chair/3-in-1;With armrests;With upper extremity assist Sit to Stand Details (indicate cue type and reason): mod assist to help steady patietn for balance during transition especially from low chair once fatigued, patient letaning right.   Stand to Sit: 3: Mod assist;With armrests;To  chair/3-in-1;With upper extremity assist Stand to Sit Details: mod assist to help patient lower slowly and hit stting surface, tends to sit prematurely and almost miss target sitting surface x2 during session despite max verbal cues for safe technique.  This increased with increased fatigue.   Ambulation/Gait Ambulation/Gait: Yes Ambulation/Gait Assistance: 1: +2 Total assist Ambulation/Gait Assistance Details (indicate cue type and reason): Patient 75%, listing to the right, max verbal cues for left lean and to stay in the middle of the hallway.  Patient also with weak and bucking R leg at end of walk due to fatigue.   Ambulation Distance (Feet): 150 Feet Assistive device: 2 person hand held assist Gait Pattern: Decreased stance time - right;Shuffle;Lateral trunk lean to right Gait velocity: <1.8 ft/sec which puts him at high risk for recurrent falls.      End of Session PT - End of Session Equipment Utilized During Treatment: Gait belt (chair to follow in hallway for safety and 2 person for gait.) Activity Tolerance: Patient limited by fatigue Patient left: in chair;with call bell in reach;with family/visitor present (daughter at beginning of session) General Behavior During Session:  (weeping 2-3 times during the session) Cognition: Impaired Cognitive Impairment: decreased safety awareness, decreased processing of cues, decreased sustained attention.    Rollene Rotunda Kaleesi Guyton, PT, DPT [phone] 01/01/2012, 12:50 PM

## 2012-01-01 NOTE — Progress Notes (Signed)
Clinical Social Work Department BRIEF PSYCHOSOCIAL ASSESSMENT 01/01/2012  Patient:  [name], [name]     Account Number:  [id number]     Admit date:  12/28/2011  Clinical Social Worker:  Doree Albee  Date/Time:  01/01/2012 05:30 PM  Referred by:  RN  Date Referred:  01/01/2012 Referred for  SNF Placement   Other Referral:   Interview type:  Family Other interview type:   wife and daughter    PSYCHOSOCIAL DATA Living Status:  FAMILY Admitted from facility:   Level of care:   Primary support name:  [name] Primary support relationship to patient:  SPOUSE Degree of support available:   strong    Pt daughter [name] strong    CURRENT CONCERNS Current Concerns  Post-Acute Placement  Post-Acute Placement   Other Concerns:    SOCIAL WORK ASSESSMENT / PLAN CSW spoke with pt wife and pt daughter regarding pt current living environment and discharge plan. CSW discussed with pt wife and pt daughter at pt wife request regarding recommendations for inpatient rehab and skilled nursing for rehab.    Pt wife stated, "I can't take care of him by myself" and was requesting help at home. Pt daughter confirmed that patient wife has trouble caring for patient at home. Pt daughter is concern regarding pt wife hearing loss that patient wife will not hear when patient needs assistance.    Pt duaghter and Pt wife agreed to begin snf search. CSW will continue to follow to assist with pt dc plans and to help determine approrpriate discharge plan for patient to either CIR or SNF for short term rehab.   Assessment/plan status:   Other assessment/ plan:   Information/referral to community resources:    PATIENT'S/FAMILY'S RESPONSE TO PLAN OF CARE:     .[name], [name]  [phone] .01/01/2012 1744pm

## 2012-01-02 DIAGNOSIS — I6529 Occlusion and stenosis of unspecified carotid artery: Secondary | ICD-10-CM | POA: Diagnosis present

## 2012-01-02 DIAGNOSIS — R4701 Aphasia: Secondary | ICD-10-CM | POA: Diagnosis present

## 2012-01-02 LAB — GLUCOSE, CAPILLARY
Glucose-Capillary: 224 mg/dL — ABNORMAL HIGH (ref 70–99)
Glucose-Capillary: 186 mg/dL — ABNORMAL HIGH (ref 70–99)

## 2012-01-02 MED ORDER — CLOPIDOGREL BISULFATE 75 MG PO TABS: 75.0000 mg | ORAL_TABLET | Freq: Every day | ORAL | Status: DC

## 2012-01-02 MED ORDER — STARCH (THICKENING) PO POWD: 1.0000 g | ORAL | Status: DC | PRN

## 2012-01-02 MED ORDER — INSULIN GLARGINE 100 UNIT/ML ~~LOC~~ SOLN: 10.0000 [IU] | Freq: Every day | SUBCUTANEOUS | Status: DC

## 2012-01-02 MED ORDER — INSULIN ASPART 100 UNIT/ML ~~LOC~~ SOLN: 0.0000 [IU] | Freq: Three times a day (TID) | SUBCUTANEOUS | Status: DC

## 2012-01-02 NOTE — Discharge Summary (Signed)
DISCHARGE SUMMARY  [name]  MR#: [id number]  DOB:January 09, 1941  Date of Admission: 12/28/2011 Date of Discharge: 01/02/2012  Attending Physician:Josue Kass  Patient's PCP:No primary provider on file.  Consults:  Dr Lyman Speller  Discharge Diagnoses: Present on Admission:  .CVA (cerebral infarction) .DM (diabetes mellitus), type 2, uncontrolled .HTN (hypertension), benign .Carotid artery occlusion .Expressive aphasia    Hospital Course: Mr [name] was admitted on 12/28/11 after a fall at home. He apparently woke up with right sided weakness and dysarthria. Brain MRI/MRA showed  an "Acute infarct in the left middle cerebral artery territory involving left basal ganglia and left insula. This appears to be a slightly hemorrhagic infarct. 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 2. Probable focal stenosis of the proximal right vertebral artery, the dominant vessel." A CTA of the head and neck confirmed  "Occlusion of the left internal carotid artery at its origin. There is no reconstitution in the neck. 2. 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 3. 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 Speech therapy has continued to work with him on his expressive aphasia, and swallowing. He is making progress, and will have another Swallow evaluation in the am. He has been referred to a SNF for rehab, and will d/c in am if no acute changes. He needs optimization of risk factors like DM2(hb a1c-14), and htn.    Medication List  As of 01/02/2012  5:31 PM   STOP taking these medications         doxycycline 100 MG DR capsule         TAKE these medications         aspirin 81 MG tablet   Take 81 mg by mouth daily.      clopidogrel 75 MG tablet   Commonly known as: PLAVIX     Take 1 tablet (75 mg total) by mouth daily with breakfast.      ezetimibe-simvastatin 10-40 MG per tablet   Commonly known as: VYTORIN   Take 1 tablet by mouth at bedtime.      food thickener Powd   Commonly known as: THICK IT   Take 1 g by mouth as needed.      insulin aspart 100 UNIT/ML injection   Commonly known as: novoLOG   Inject 0-9 Units into the skin 3 (three) times daily with meals.      insulin glargine 100 UNIT/ML injection   Commonly known as: LANTUS   Inject 10 Units into the skin daily.      oxazepam 30 MG capsule   Commonly known as: SERAX   Take 30 mg by mouth at bedtime.      sitaGLIPtan-metformin 50-1000 MG per tablet   Commonly known as: JANUMET   Take 1 tablet by mouth 2 (two) times daily with a meal.      valsartan 160 MG tablet   Commonly known as: DIOVAN   Take 160 mg by mouth daily.             Day of Discharge BP 114/72  Pulse 59  Temp(Src) 98.7 F (37.1 C) (Oral)  Resp 18  Ht 5\' 3"  (1.6 m)  Wt 68.04 kg (150 lb)  BMI 26.57 kg/m2  SpO2 99%  Physical Exam: Dysarthric, expressive aphasia. Power 3-4/5 right arm.  Results for  orders placed during the hospital encounter of 12/28/11 (from the past 24 hour(s))  GLUCOSE, CAPILLARY     Status: Abnormal   Collection Time   01/01/12  6:43 PM      Component Value Range   Glucose-Capillary 227 (*) 70 - 99 (mg/dL)  GLUCOSE, CAPILLARY     Status: Abnormal   Collection Time   01/01/12 10:37 PM      Component Value Range   Glucose-Capillary 132 (*) 70 - 99 (mg/dL)  GLUCOSE, CAPILLARY     Status: Abnormal   Collection Time   01/02/12  7:11 AM      Component Value Range   Glucose-Capillary 156 (*) 70 - 99 (mg/dL)   Comment 1 Documented in Chart     Comment 2 Notify RN    GLUCOSE, CAPILLARY     Status: Abnormal   Collection Time   01/02/12 11:23 AM      Component Value Range   Glucose-Capillary 224 (*) 70 - 99 (mg/dL)  GLUCOSE, CAPILLARY     Status: Abnormal   Collection Time   01/02/12   4:43 PM      Component Value Range   Glucose-Capillary 209 (*) 70 - 99 (mg/dL)    Disposition: for snf.   Follow-up Appts: Discharge Orders    Future Orders Please Complete By Expires   Diet - low sodium heart healthy      Increase activity slowly           Time spent in discharge (includes decision making & examination of pt): 40 minutes  Signed: Gunhild Bautch 01/02/2012, 5:31 PM

## 2012-01-02 NOTE — Progress Notes (Signed)
01/02/2012 Derell Bruun Elizabeth PTA [phone] pager [phone] office    

## 2012-01-02 NOTE — Progress Notes (Signed)
Physical Therapy Treatment Patient Details Name: [name] MRN: [id number] DOB: 23-Oct-1940 Today's Date: 01/02/2012  PT Assessment/Plan  PT - Assessment/Plan Comments on Treatment Session: Pt was happy and willing to get OOB. Pt was able to amb with +2 assistance. Pt requires VC's and tactile assistance to control/maintain proper body postioning throughout treatment. Pt also needed a sit down break during amb before continuing to amb.  PT Plan: Discharge plan remains appropriate;Frequency remains appropriate PT Frequency: Min 4X/week Recommendations for Other Services: Rehab consult Follow Up Recommendations: Inpatient Rehab Equipment Recommended: Defer to next venue PT Goals     PT Treatment Precautions/Restrictions  Precautions Precautions: Fall Precaution Comments: expressive apahsia Restrictions Weight Bearing Restrictions: No Mobility (including Balance) Bed Mobility Supine to Sit: 4: Min assist;HOB flat;With rails Supine to Sit Details (indicate cue type and reason): Pt was able to get to EOB but need assist with hand placement when reaching for rails. Pt was given hand over hand assitance instead of rails.   Sitting - Scoot to Edge of Bed: 4: Min assist;With rail Sitting - Scoot to [place] of Bed Details (indicate cue type and reason): Pt required assistance with sitting balance when trying to attempt scoot. Pt tends to lean towards right. Pt was encouraged to use R arm for balance.  Pt used momentum to scoot forward.   Transfers Transfers: Yes Sit to Stand: 4: Min assist;From bed (Minguard A for safety ) Sit to Stand Details (indicate cue type and reason): Pt required assistance with body control during ascent. Pt leaning towards R side. Stand to Sit: With armrests;With upper extremity assist;To chair/3-in-1 Stand to Sit Details: Pt required VC's for hand placement and assistance to control body posture during descent  Ambulation/Gait Ambulation/Gait:  Yes Ambulation/Gait Assistance: 1: +2 Total assist;Patient percentage (comment) (pt=75%) Ambulation/Gait Assistance Details (indicate cue type and reason): Pt required assistance with posture during amb and required standing and sitting rest break due to fatique. A to shift hips towards midline with ambulation as patient has tendency to shift and lose balance to R side. Pt with shuffle gait and R knee buckling.  Ambulation Distance (Feet): 150 Feet Assistive device: 2 person hand held assist;Other (Comment) Gait Pattern: Shuffle;Decreased trunk rotation;Lateral trunk lean to left;Decreased step length - right;Decreased stride length Stairs: No Wheelchair Mobility Wheelchair Mobility: No  End of Session PT - End of Session Equipment Utilized During Treatment: Gait belt (+2 for gait with chair for safety) Activity Tolerance: Patient limited by fatigue;Patient tolerated treatment well Patient left: in chair;with call bell in reach Nurse Communication: Mobility status for transfers;Mobility status for ambulation General Behavior During Session: [facility] for tasks performed Cognition: [name] for tasks performed  [name] 01/02/2012, 1:39 PM

## 2012-01-02 NOTE — Progress Notes (Signed)
Speech Language Pathology Dysphagia Treatment  Patient Details Name: [name] MRN: [id number] DOB: [birth year] Today's Date: 01/02/2012  SLP Assessment/Plan/Recommendation Assessment / Recommendations / Plan Clinical Impression Statement: Patient appears to have clinical improvement at bedside and may be appropriate for diet advancement prior to d/c to SNF.  A repeat MBS is indicated secondary to previously documented silent aspiration with liquids.  Patient was able to spontaneously move the bolus orally with his tongue, rather than tilting his head back, as done on evaluation. Continue with Current Diet: Dysphagia 1 (puree);Honey-thick liquid Medication Administration: Whole meds with puree Supervision: Full supervision/cueing for compensatory strategies Compensations: Slow rate;Small sips/bites Postural Changes and/or Swallow Maneuvers: Seated upright 90 degrees Oral Care Recommendations: Oral care QID Plan: MBS prior to d/c to SNF.  Discussed with MD and telephone order provided. Swallowing Goals  SLP Swallowing Goals Patient will consume recommended diet without observed clinical signs of aspiration with: Minimal cueing;Supervision/safety;Minimal assistance Swallow Study Goal #1 - Progress: Met Patient will utilize recommended strategies during swallow to increase swallowing safety with: Minimal cueing Swallow Study Goal #2 - Progress: Progressing toward goal  General Temperature Spikes Noted: No Respiratory Status: Room air Behavior/Cognition: Alert;Cooperative Oral Cavity - Dentition: Edentulous Patient Positioning: Upright in bed  Oral Cavity - Oral Hygiene Does patient have any of the following "at risk" factors?: Diet - patient on thickened liquids Brush patient's teeth BID with toothbrush (using toothpaste with fluoride): Yes Patient is HIGH RISK - Oral Care Protocol followed (see row info): Yes Patient is AT RISK - Oral Care Protocol followed (see row info): Yes     Dysphagia Treatment Treatment focused on: Skilled observation of diet tolerance;Utilization of compensatory strategies Treatment Methods/Modalities: Skilled observation Patient observed directly with PO's: Yes Type of PO's observed: Honey-thick liquids Feeding: Able to feed self Liquids provided via: Teaspoon Oral Phase Signs & Symptoms:  (Good bolus transport orally (appears improved)) Pharyngeal Phase Signs & Symptoms:  (No overt s/s of aspiration; silent aspiration on MBS) Type of cueing: Verbal Amount of cueing: Minimal   Maryjo Rochester T 01/02/2012, 4:49 PM

## 2012-01-02 NOTE — Progress Notes (Signed)
Speech Language Pathology Treatment  Patient Details Name: [name] MRN: [id number] DOB: 02-20-1941 Today's Date: 01/02/2012  SLP Assessment/Plan/Recommendation Assessment / Recommendations / Plan Clinical Impression Statement: Patient highly motivated and demonstrates good effort in therapy.  He continues to exhibit a severe espressive aphasia with frequent emotional lability (became tearful when did well).  Patient has improved since the initial evaluation, as he was able to repeat some functional words and phrases. Plan: Continue with current plan of care  SLP Goals  SLP Goals Potential to Achieve Goals: Good Potential Considerations: Severity of impairments Progress/Goals/Alternative treatment plan discussed with pt/caregiver and they: Patient unable to parrticipate in goal setting;No caregivers available  General Temperature Spikes Noted: No Behavior/Cognition: Alert;Cooperative Oral Cavity - Dentition: Edentulous Patient Positioning: Upright in bed  Oral Cavity - Oral Hygiene Does patient have any of the following "at risk" factors?: Diet - patient on thickened liquids Brush patient's teeth BID with toothbrush (using toothpaste with fluoride): Yes Patient is HIGH RISK - Oral Care Protocol followed (see row info): Yes   Treatment Treatment focused on: Aphasia Skilled Treatment: Aphasia treatment consisted of Melodic Intonnation, with patient being able to sing "Happy Birthday" and "Amazing [name]" with close approximations, in unison with SLP.  Patient also completed automatic speech sequences, such as counting and saying the days of the week, also in unison with the SLP.  Patient was able to repeat functional words and phrases to state basic needs.  Patient's spontaneous utterances are mostly unintelligible paraphasic errrors, with little to no  self-monitoring or self-correction.  Patient was eager  to work in speech therapy and put forth good effort.   Maryjo Rochester  T 01/02/2012, 4:38 PM   prog

## 2012-01-02 NOTE — Progress Notes (Signed)
Rehab admissions - I spoke with wife and daughter of patient.  Wife cannot manage after a 2-3 week rehab stay.  Wife and dtr prefer SNF placement.  I have contacted the social worker and updated her.  Call for questions.  Pager [phone]

## 2012-01-03 ENCOUNTER — Inpatient Hospital Stay (HOSPITAL_COMMUNITY): Payer: Medicare Other

## 2012-01-03 LAB — GLUCOSE, CAPILLARY: Glucose-Capillary: 151 mg/dL — ABNORMAL HIGH (ref 70–99)

## 2012-01-03 MED ORDER — CLOPIDOGREL BISULFATE 75 MG PO TABS: 75.0000 mg | ORAL_TABLET | Freq: Every day | ORAL | Status: DC

## 2012-01-03 MED ORDER — INSULIN ASPART 100 UNIT/ML ~~LOC~~ SOLN: 0.0000 [IU] | Freq: Three times a day (TID) | SUBCUTANEOUS | Status: DC

## 2012-01-03 MED ORDER — STARCH (THICKENING) PO POWD: 1.0000 g | ORAL | Status: DC | PRN

## 2012-01-03 MED ORDER — ENSURE PUDDING PO PUDG
1.0000 | Freq: Two times a day (BID) | ORAL | Status: DC
Start: 2012-01-03 — End: 2012-01-03
  Administered 2012-01-03: 1 via ORAL

## 2012-01-03 MED ORDER — ASPIRIN 81 MG PO TABS: 81.0000 mg | ORAL_TABLET | Freq: Every day | ORAL | Status: DC

## 2012-01-03 NOTE — Procedures (Signed)
Objective Swallowing Evaluation: Modified Barium Swallowing Study (Repeat MBS for possible diet advancement.)  Patient Details  Name: [name] MRN: [id number] Date of Birth: 1941/07/21  Today's Date: 01/03/2012    Past Medical History:  Past Medical History  Diagnosis Date  . Myocardial infarction    Past Surgical History: History reviewed. No pertinent past surgical history. HPI: See hx. On previous MBS of 12/28/11.       Recommendation/Prognosis  Clinical Impression Dysphagia Diagnosis: Moderate oral phase dysphagia;Moderate pharyngeal phase dysphagia Clinical impression: Patient continues to exhibit a moderate oral and pharyngeal dysphagia characterized by decreased tongue strength and control to manipulate soft mecahanical or solid foods, inability to move a whole pill posteriorly with applesauce,  delayed swallow initiation, vallecular pooing, inadequate laryngeal elevation and decreased vocal fold closure during the swallow.  There deficits affect patients functional ability to chew soft solid foods, swallow whole pills, and drink nectar or thin liquids without aspiration.  A chin tuck was attempted with nectar thick liquids, but did not prevent aspiration. Swallow Evaluation Recommendations Diet Recommendations: Dysphagia 1 (Puree);Dysphagia 2 (Fine chop);Honey-thick liquid Liquid Administration via: Cup Medication Administration: Crushed with puree Supervision: Staff feed patient;Full supervision/cueing for compensatory strategies Compensations: Slow rate;Small sips/bites Postural Changes and/or Swallow Maneuvers: Seated upright 90 degrees Oral Care Recommendations: Oral care QID;Staff/trained caregiver to provide oral care Other Recommendations: Order thickener from pharmacy;Prohibited food (jello, ice cream, thin soups);Clarify dietary restrictions Recommendations for Other Services: Other (Comment) (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  There deficits affect patients functional ability to chew soft solid foods, swallow whole pills, and drink nectar or thin liquids without aspiration.  A chin tuck was attempted with nectar thick liquids, but did not prevent aspiration.  SLP Goals  SLP Swallowing Goals Patient will consume recommended diet without observed clinical signs of aspiration with: Moderate assistance;Set-up;Supervision/safety  General:  Date of Onset: 12/28/11 HPI: Pt is a 71 yo male adm to [facility] hospital after falling on porch, pt found to have right sided weakness, dysarthric with ability to follow commands.  CT Head showed perventricular white matter changes, nothing acute.  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  CXR 12/28/11 no acute disease.  Pt did not pass RN stroke swallow screen and bedside swallow evaluation was subsequently ordered.  Per RN, pt resides at home with his wife, no family present to etablish baseline function.  BSE indicated signs of significant dysphagia, MBS to determine safety with POs.  Type of Study: Modified Barium Swallowing Study (Repeat MBS for possible diet advancement.) Previous Swallow Assessment: 12/28/11 Diet Prior to this Study: Dysphagia 1 (puree);Honey-thick liquids Temperature Spikes Noted: No Respiratory Status: Room air History of Intubation: No Behavior/Cognition: Alert;Cooperative Oral Cavity - Dentition: Edentulous Oral Motor / Sensory  Function: Impaired - see Bedside swallow eval Vision: Functional for self-feeding Patient Positioning: Upright in chair Baseline Vocal Quality: Clear Volitional Cough: Weak Volitional Swallow: Unable to elicit Anatomy: Other (Comment) Pharyngeal Secretions: Not observed secondary MBS  Reason for Referral:  Dysphagia  Oral Phase Oral Preparation/Oral Phase Oral  Phase: Impaired Oral - Solids Oral - Mechanical Soft: Impaired mastication;Weak lingual manipulation;Reduced posterior propulsion;Delayed oral transit Oral - Regular: Impaired mastication;Weak lingual manipulation;Reduced posterior propulsion;Delayed oral transit Oral - Pill: Incomplete tongue to palate contact;Reduced posterior propulsion;Delayed oral transit;Piecemeal swallowing;Other (Comment) (Swallowed applesauce, but unable to move pill posteriorly) Pharyngeal Phase  Pharyngeal Phase Pharyngeal Phase: Impaired Pharyngeal - Honey Pharyngeal - Honey Cup: Delayed swallow initiation;Premature spillage to valleculae Pharyngeal - Nectar Pharyngeal - Nectar Teaspoon: Delayed swallow initiation;Premature spillage to valleculae;Reduced laryngeal elevation;Reduced airway/laryngeal closure;Penetration/Aspiration during swallow;Moderate aspiration;Inter-arytenoid space residue;Compensatory strategies attempted (Comment) (Attempted chintuck, but did not prevent aspiration.) Pharyngeal - Solids Pharyngeal - Puree: Delayed swallow initiation Cervical Esophageal Phase  Cervical Esophageal Phase Cervical Esophageal Phase: Vicente Masson T 01/03/2012, 10:39 AM

## 2012-01-03 NOTE — Progress Notes (Signed)
INITIAL ADULT NUTRITION ASSESSMENT Date: 01/03/2012   Time: 11:10 AM Reason for Assessment: Dysphagia  ASSESSMENT: Male 71 y.o.  Dx: Fall  Hx:  Past Medical History  Diagnosis Date  . Myocardial infarction    Related Meds:     . aspirin  81 mg Oral Daily  . clopidogrel  75 mg Oral Q breakfast  . enoxaparin (LOVENOX) injection  40 mg Subcutaneous Q24H  . ezetimibe-simvastatin  1 tablet Oral QHS  . fluconazole (DIFLUCAN) IV  100 mg Intravenous Q24H  . insulin aspart  0-9 Units Subcutaneous TID WC  . insulin glargine  10 Units Subcutaneous Daily  . pantoprazole  40 mg Oral QHS   Ht: 5\' 3"  (160 cm)  Wt: 150 lb (68.04 kg)  Ideal Wt: 56.3 kg % Ideal Wt: 121%  Usual Wt: per family pt's usual wt is 160 lbs Wt Readings from Last 10 Encounters:  12/28/11 150 lb (68.04 kg)   % Usual Wt: 94%  Body mass index is 26.57 kg/(m^2). overweight  Food/Nutrition Related Hx: pt unable to answer questions due to expressive aphasia. Pt's wife is very difficult to understand. Pt illiterate. Most of hx from daughter and son-in-law. Per family pt usually eats a lot, they think because of his diabetes medication. However over the last month pt has started to eat less. They have noticed a drop in his weight with his pants not fitting as well. Unable to dx malnutrition at this time however suspect some degree of malnutrition. Per pt's hgb A1C, pt with very poor control of his DM PTA. Currently plan is for pt to d/c to a SNF, therefore will defer any education to that venue. Last bm 4/12  Labs:  CMP     Component Value Date/Time   NA 135 01/01/2012 0512   K 3.7 01/01/2012 0512   CL 103 01/01/2012 0512   CO2 23 01/01/2012 0512   GLUCOSE 179* 01/01/2012 0512   BUN 14 01/01/2012 0512   CREATININE 0.83 01/01/2012 0512   CALCIUM 9.0 01/01/2012 0512   PROT 6.0 01/01/2012 0512   ALBUMIN 2.8* 01/01/2012 0512   AST 16 01/01/2012 0512   ALT 9 01/01/2012 0512   ALKPHOS 75 01/01/2012 0512   BILITOT 0.3  01/01/2012 0512   GFRNONAA 87* 01/01/2012 0512   GFRAA >90 01/01/2012 0512  TRIG: 235 12/30/11 Lab Results  Component Value Date   HGBA1C 14.4* 12/29/2011   CBG (last 3)   Basename 01/02/12 2204 01/02/12 1643 01/02/12 1123  GLUCAP 186* 209* 224*    Intake/Output Summary (Last 24 hours) at 01/03/12 1120 Last data filed at 01/03/12 0900  Gross per 24 hour  Intake    360 ml  Output    950 ml  Net   -590 ml   Diet Order: Dysphagia 1/Honey; intake recorded as 25-100% of his meals.   Supplements/Tube Feeding: none  IVF: NA  Estimated Nutritional Needs:   Kcal: 1600-1800 Protein: 78-90 grams Fluid:>2 L/day  NUTRITION DIAGNOSIS: -Swallowing difficulty (NI-1.1).  Status: Ongoing  RELATED TO: stroke  AS EVIDENCE BY: need for dysphagia diet  MONITORING/EVALUATION(Goals): Goal: Pt will consume > 90 % of his estimated needs Monitor: po intake, swallow eval, weight  EDUCATION NEEDS: -Education not appropriate at this time, defer to next venue  INTERVENTION:  Ensure pudding bid  Dietitian #:[phone]  DOCUMENTATION CODES Per approved criteria  -Not Applicable    Kendell Bane Cornelison 01/03/2012, 11:10 AM

## 2012-01-03 NOTE — Progress Notes (Signed)
Pt d/c instructions placed in d/c package for facility. Iv d/c intact. Pt under no s/s distress.

## 2012-01-03 NOTE — Progress Notes (Signed)
OT/PT Cancellation Note  Treatment cancelled today due to patient receiving procedure or test. Attempted to see with OT. Pt at test. Reviewed chart and noted patient planned to DC to SNF today. Will check on patient again later in the day and follow up as time allows.   01/03/2012 Cipriano Mile OTR/L Pager [phone] Office [phone]

## 2012-01-03 NOTE — Progress Notes (Signed)
PT Cancellation Note  Treatment cancelled today due to patient receiving procedure or test. Attempted to see with OT. Pt at test. Reviewed chart and noted patient planned to DC to SNF today. Will check on patient again later in the day and follow up as time allows.  Thanks 01/03/2012 Fredrich Birks PTA [phone] pager [phone] office    Fredrich Birks 01/03/2012, 9:52 AM

## 2012-01-03 NOTE — Discharge Summary (Addendum)
Please refer to the discharge summary dictated by Dr. Venetia Constable on 4/16  Patient seen and examined today, dysarthric but he stated that he feels all right. No new events noted. He had MBS done today that showed moderate oral phase dysphagia, moderate pharyngeal phase dysphagia and speech pathology recommendations as below: Swallow Evaluation Recommendations  Diet Recommendations: Dysphagia 1 (Puree);Dysphagia 2 (Fine chop);Honey-thick liquid  Liquid Administration via: Cup  Medication Administration: Crushed with puree  Supervision: Staff feed patient;Full supervision/cueing for compensatory strategies  Compensations: Slow rate;Small sips/bites  Postural Changes and/or Swallow Maneuvers: Seated upright 90 degrees  Oral Care Recommendations: Oral care QID;Staff/trained caregiver to provide oral care  Other Recommendations: Order thickener from pharmacy;Prohibited food (jello, ice cream, thin soups);Clarify dietary restrictions  Recommendations for Other Services: Other (Comment) (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  * continue aspirin and Plavix for 3 months then continue with Plavix alone. Continue rest of medications as per  previously dictated discharge summary. *Patient is stable for discharge to skilled nursing facility today, follow with PCP within one week.

## 2012-01-03 NOTE — Progress Notes (Signed)
Clinical Social Worker confirmed pt's dc with pt, family, RN, MD, and SNF.  All in agreement pt ready for dc to SNF-[facility].  CSW arranged transportation.  CSW to sign off, please re consult if additional needs arise.   Angelia Mould, MSW, [place] [phone]

## 2012-01-16 ENCOUNTER — Emergency Department (HOSPITAL_COMMUNITY): Payer: Medicare Other

## 2012-01-16 ENCOUNTER — Encounter (HOSPITAL_COMMUNITY): Payer: Self-pay | Admitting: *Deleted

## 2012-01-16 ENCOUNTER — Emergency Department (HOSPITAL_COMMUNITY)
Admission: EM | Admit: 2012-01-16 | Discharge: 2012-01-16 | Disposition: A | Discharge status: Home / Self Care | Payer: Medicare Other | Attending: Emergency Medicine | Admitting: Emergency Medicine

## 2012-01-16 DIAGNOSIS — M549 Dorsalgia, unspecified: Secondary | ICD-10-CM | POA: Insufficient documentation

## 2012-01-16 DIAGNOSIS — E119 Type 2 diabetes mellitus without complications: Secondary | ICD-10-CM | POA: Insufficient documentation

## 2012-01-16 DIAGNOSIS — I252 Old myocardial infarction: Secondary | ICD-10-CM | POA: Insufficient documentation

## 2012-01-16 DIAGNOSIS — R079 Chest pain, unspecified: Secondary | ICD-10-CM | POA: Insufficient documentation

## 2012-01-16 DIAGNOSIS — J40 Bronchitis, not specified as acute or chronic: Secondary | ICD-10-CM | POA: Insufficient documentation

## 2012-01-16 DIAGNOSIS — I10 Essential (primary) hypertension: Secondary | ICD-10-CM | POA: Insufficient documentation

## 2012-01-16 HISTORY — DX: Cerebral infarction, unspecified: I63.9

## 2012-01-16 HISTORY — DX: Aphasia: R47.01

## 2012-01-16 HISTORY — DX: Type 2 diabetes mellitus without complications: E11.9

## 2012-01-16 HISTORY — DX: Essential (primary) hypertension: I10

## 2012-01-16 MED ORDER — SULFAMETHOXAZOLE-TRIMETHOPRIM 800-160 MG PO TABS: 1.0000 | ORAL_TABLET | Freq: Two times a day (BID) | ORAL | Status: AC

## 2012-01-16 MED ORDER — BENZONATATE 100 MG PO CAPS: 100.0000 mg | ORAL_CAPSULE | Freq: Three times a day (TID) | ORAL | Status: AC

## 2012-01-16 NOTE — ED Notes (Signed)
Patient d/c report called to [facility].  Report given to Rubie Maid, RN.

## 2012-01-16 NOTE — ED Notes (Signed)
Patient at assisted care facility this afternoon and stated he started having some slight chest discomfort and bilateral lower back pain.  Facility administered nitro tabs x 3 with no improvement in pain, patient states at this time he only has pain in lower back bilaterally.

## 2012-01-16 NOTE — ED Notes (Signed)
Per shift change report, [facility] is on the way to pick up the patient for transport to his SNF.

## 2012-01-16 NOTE — ED Provider Notes (Signed)
History     CSN: [id number]  Arrival date & time 01/16/12  1932   First MD Initiated Contact with Patient 01/16/12 2026      Chief Complaint  Patient presents with  . Chest Pain  . Back Pain    (Consider location/radiation/quality/duration/timing/severity/associated sxs/prior treatment) Patient is a 71 y.o. male presenting with chest pain and back pain. The history is provided by the patient and a relative.  Chest Pain Primary symptoms include shortness of breath and cough. Pertinent negatives for primary symptoms include no fever, no palpitations, no nausea and no vomiting.    Back Pain  Associated symptoms include chest pain. Pertinent negatives include no fever and no headaches.   the patient is a 71 year old, male, with history of coronary disease, who presents to emergency department complaining of chest pain, and nonproductive cough, earlier today.  He states that the chest.  Pain lasted only 15-20 minutes.  His daughter says that she thinks it lasted more like 30 minutes.  He also has lower back pain.  He denies shortness breath, nausea, vomiting, sweating.  Currently, he is asymptomatic.    Past Medical History  Diagnosis Date  . Myocardial infarction   . CVA (cerebral infarction)   . Diabetes mellitus   . Hypertension   . Diabetes mellitus   . Aphasia     History reviewed. No pertinent past surgical history.  No family history on file.  History  Substance Use Topics  . Smoking status: Unknown If Ever Smoked  . Smokeless tobacco: Not on file  . Alcohol Use: No      Review of Systems  Constitutional: Negative for fever and chills.  HENT: Negative for neck pain.   Respiratory: Positive for cough and shortness of breath. Negative for chest tightness.   Cardiovascular: Positive for chest pain. Negative for palpitations and leg swelling.  Gastrointestinal: Negative for nausea and vomiting.  Musculoskeletal: Positive for back pain.  Neurological: Negative for  headaches.  Psychiatric/Behavioral: Negative for confusion.  All other systems reviewed and are negative.    Allergies  Review of patient's allergies indicates no known allergies.  Home Medications   Current Outpatient Rx  Name Route Sig Dispense Refill  . ASPIRIN EC 81 MG PO TBEC Oral Take 81 mg by mouth daily.    Marland Kitchen CLOPIDOGREL BISULFATE 75 MG PO TABS Oral Take 75 mg by mouth daily with breakfast.    . INSULIN ASPART 100 UNIT/ML [place] SOLN Subcutaneous Inject 0-10 Units into the skin 3 (three) times daily with meals. As per scale  CBG 61-150        0 UNIT CBG 151-200        2 UNIT CBG 201-250     4 UNIT CBG 251-300      6 UNIT CBG 301-350    8 UNIT CBG 351-400     10 UNIT  CBG>400 ,calll your doctor    . INSULIN GLARGINE 100 UNIT/ML [place] SOLN Subcutaneous Inject 10 Units into the skin at bedtime.    Marland Kitchen SITAGLIPTIN-METFORMIN HCL 50-1000 MG PO TABS Oral Take 1 tablet by mouth 2 (two) times daily with a meal.    . VALSARTAN 160 MG PO TABS Oral Take 160 mg by mouth daily.    Marland Kitchen BENZONATATE 100 MG PO CAPS Oral Take 1 capsule (100 mg total) by mouth every 8 (eight) hours. 21 capsule 0  . SULFAMETHOXAZOLE-TRIMETHOPRIM 800-160 MG PO TABS Oral Take 1 tablet by mouth every 12 (twelve) hours. 20 tablet  0    BP 94/61  Pulse 73  Temp(Src) 98.1 F (36.7 C) (Oral)  Resp 20  SpO2 100%  Physical Exam  Vitals reviewed. Constitutional: He is oriented to person, place, and time. He appears well-developed and well-nourished. No distress.  HENT:  Head: Normocephalic and atraumatic.  Eyes: Conjunctivae are normal.  Neck: Normal range of motion. Neck supple.  Cardiovascular: Normal rate.   No murmur heard. Pulmonary/Chest: Effort normal and breath sounds normal.  Abdominal: Soft. Bowel sounds are normal.  Neurological: He is alert and oriented to person, place, and time.  Skin: Skin is warm and dry.  Psychiatric: He has a normal mood and affect. Thought content normal.    ED Course    Procedures (including critical care time)    Labs Reviewed  POCT I-STAT TROPONIN I   Dg Chest Port 1 View  01/16/2012  *RADIOLOGY REPORT*  Clinical Data: Cough, diabetic.  PORTABLE CHEST - 1 VIEW  Comparison: 12/28/2011  Findings: Hypoaeration results in interstitial and vascular crowding.  Minimal right lower lobe opacity medially.  No pleural effusion or pneumothorax.  No acute osseous abnormality. Status post median sternotomy and CABG.  Fractured superior most sternal wire is unchanged.  IMPRESSION: Minimal right lower lobe opacity; atelectasis versus infiltrate.  Original Report Authenticated By: Waneta Martins, M.D.     1. Bronchitis       MDM  Bronchitis No pneumonia, respiratory distress.  No evidence of acute coronary syndrome.  Symptoms completely resolved in the emergency department without intervention.  No physical exam findings of acute illness.        Cheri Guppy, MD 01/16/12 [phone]

## 2012-01-16 NOTE — Discharge Instructions (Signed)
Your blood tests do not show any signs of damage to your heart.  Your chest x-ray does not show any signs of significant illness.  Use Bactrim for bronchitis and Tessalon for cough.  Followup with your Dr. if your symptoms.  Last more than 3-4 days.  Return for worse or uncontrolled symptoms

## 2012-01-16 NOTE — ED Notes (Signed)
Patient states he has no pain at this time.

## 2012-01-29 ENCOUNTER — Inpatient Hospital Stay (HOSPITAL_COMMUNITY)
Admission: EM | Admit: 2012-01-29 | Discharge: 2012-02-01 | DRG: 683 | Disposition: A | Discharge status: Skilled Nursing Facility | Payer: Medicare Other | Source: Ambulatory Visit | Attending: Internal Medicine | Admitting: Internal Medicine

## 2012-01-29 ENCOUNTER — Encounter (HOSPITAL_COMMUNITY): Payer: Self-pay | Admitting: *Deleted

## 2012-01-29 ENCOUNTER — Emergency Department (HOSPITAL_COMMUNITY): Payer: Medicare Other

## 2012-01-29 DIAGNOSIS — D72829 Elevated white blood cell count, unspecified: Secondary | ICD-10-CM | POA: Diagnosis present

## 2012-01-29 DIAGNOSIS — E870 Hyperosmolality and hypernatremia: Secondary | ICD-10-CM | POA: Diagnosis present

## 2012-01-29 DIAGNOSIS — I10 Essential (primary) hypertension: Secondary | ICD-10-CM | POA: Diagnosis present

## 2012-01-29 DIAGNOSIS — I6529 Occlusion and stenosis of unspecified carotid artery: Secondary | ICD-10-CM

## 2012-01-29 DIAGNOSIS — I639 Cerebral infarction, unspecified: Secondary | ICD-10-CM | POA: Diagnosis present

## 2012-01-29 DIAGNOSIS — R4701 Aphasia: Secondary | ICD-10-CM | POA: Diagnosis present

## 2012-01-29 DIAGNOSIS — I69920 Aphasia following unspecified cerebrovascular disease: Secondary | ICD-10-CM

## 2012-01-29 DIAGNOSIS — E1165 Type 2 diabetes mellitus with hyperglycemia: Secondary | ICD-10-CM

## 2012-01-29 DIAGNOSIS — E861 Hypovolemia: Secondary | ICD-10-CM

## 2012-01-29 DIAGNOSIS — I252 Old myocardial infarction: Secondary | ICD-10-CM

## 2012-01-29 DIAGNOSIS — N179 Acute kidney failure, unspecified: Principal | ICD-10-CM | POA: Diagnosis present

## 2012-01-29 DIAGNOSIS — IMO0001 Reserved for inherently not codable concepts without codable children: Secondary | ICD-10-CM | POA: Diagnosis present

## 2012-01-29 DIAGNOSIS — N19 Unspecified kidney failure: Secondary | ICD-10-CM

## 2012-01-29 DIAGNOSIS — E875 Hyperkalemia: Secondary | ICD-10-CM | POA: Diagnosis present

## 2012-01-29 DIAGNOSIS — R1310 Dysphagia, unspecified: Secondary | ICD-10-CM | POA: Diagnosis present

## 2012-01-29 HISTORY — DX: Cerebral infarction, unspecified: I63.9

## 2012-01-29 HISTORY — DX: Chronic obstructive pulmonary disease, unspecified: J44.9

## 2012-01-29 HISTORY — DX: Shortness of breath: R06.02

## 2012-01-29 LAB — URINE MICROSCOPIC-ADD ON

## 2012-01-29 LAB — BLOOD GAS, VENOUS
Acid-base deficit: 10.8 mmol/L — ABNORMAL HIGH (ref 0.0–2.0)
Drawn by: 290081
O2 Content: 4 L/min
O2 Saturation: 52.4 %
Patient temperature: 98.6

## 2012-01-29 LAB — BASIC METABOLIC PANEL
CO2: 15 mEq/L — ABNORMAL LOW (ref 19–32)
Chloride: 111 mEq/L (ref 96–112)
Creatinine, Ser: 3.7 mg/dL — ABNORMAL HIGH (ref 0.50–1.35)
Glucose, Bld: 151 mg/dL — ABNORMAL HIGH (ref 70–99)

## 2012-01-29 LAB — URINALYSIS, ROUTINE W REFLEX MICROSCOPIC
Bilirubin Urine: NEGATIVE
Hgb urine dipstick: NEGATIVE
Protein, ur: NEGATIVE mg/dL
Urobilinogen, UA: 0.2 mg/dL (ref 0.0–1.0)

## 2012-01-29 LAB — CBC
HCT: 11.7 % — ABNORMAL LOW (ref 39.0–52.0)
HCT: 37.2 % — ABNORMAL LOW (ref 39.0–52.0)
Hemoglobin: 3.7 g/dL — CL (ref 13.0–17.0)
MCV: 82.1 fL (ref 78.0–100.0)
RBC: 1.39 MIL/uL — ABNORMAL LOW (ref 4.22–5.81)
RBC: 4.53 MIL/uL (ref 4.22–5.81)
RDW: 13.3 % (ref 11.5–15.5)
WBC: 13.2 10*3/uL — ABNORMAL HIGH (ref 4.0–10.5)

## 2012-01-29 LAB — POCT I-STAT, CHEM 8
Calcium, Ion: 1.34 mmol/L — ABNORMAL HIGH (ref 1.12–1.32)
Glucose, Bld: 147 mg/dL — ABNORMAL HIGH (ref 70–99)
HCT: 35 % — ABNORMAL LOW (ref 39.0–52.0)
Hemoglobin: 11.9 g/dL — ABNORMAL LOW (ref 13.0–17.0)

## 2012-01-29 LAB — ABO/RH: ABO/RH(D): O POS

## 2012-01-29 MED ORDER — DEXTROSE 50 % IV SOLN
1.0000 | Freq: Once | INTRAVENOUS | Status: AC
  Administered 2012-01-29: 50 mL via INTRAVENOUS
  Filled 2012-01-29: qty 50

## 2012-01-29 MED ORDER — SODIUM CHLORIDE 0.9 % IV SOLN
1.0000 g | Freq: Once | INTRAVENOUS | Status: AC
  Administered 2012-01-29: 1 g via INTRAVENOUS
  Filled 2012-01-29: qty 10

## 2012-01-29 MED ORDER — SODIUM CHLORIDE 0.9 % IV SOLN
Freq: Once | INTRAVENOUS | Status: AC
  Administered 2012-01-29: 1000 mL via INTRAVENOUS

## 2012-01-29 MED ORDER — INSULIN ASPART 100 UNIT/ML ~~LOC~~ SOLN
10.0000 [IU] | Freq: Once | SUBCUTANEOUS | Status: AC
  Administered 2012-01-29: 10 [IU] via INTRAVENOUS
  Filled 2012-01-29: qty 10

## 2012-01-29 MED ORDER — DEXTROSE 5 % IV BOLUS: 1000.0000 mL | Freq: Once | INTRAVENOUS | Status: DC

## 2012-01-29 MED ORDER — SODIUM POLYSTYRENE SULFONATE 15 GM/60ML PO SUSP: 15.0000 g | Freq: Once | ORAL | Status: DC

## 2012-01-29 NOTE — ED Notes (Signed)
Pt from Nursing home with c/o lethargy, hypotensive. Per staff at nursing home, pt has become progressively weaker since yesterday, decreased appetite hasnt eaten since yesterday. Hypotensive on EMS arrival to nursing home with an initial BP of 70/44

## 2012-01-29 NOTE — ED Provider Notes (Addendum)
History     CSN: [id number]  Arrival date & time 01/29/12  7829   First MD Initiated Contact with Patient 01/29/12 2015      Chief Complaint  Patient presents with  . Fatigue  . Hypotension    EMS 70/44, 119/73, 79/43     (Consider location/radiation/quality/duration/timing/severity/associated sxs/prior treatment) HPI The patient presents from his nursing home with concerns of listlessness, weakness, anorexia.  Notably, the patient had a left MCA infarct one month ago and is nearly aphasic at baseline.  Per nursing home report and the patient's family 3 days has gradually become less interactive.  There was no clear precipitant, and there have been no alleviating factors since his symptoms began.  The patient is incapable of providing significant details of the history of present illness, but when asked if he is in pain he motions vaguely towards his stomach.  No reports of fevers, vomiting, diarrhea, hematuria.  Level v caveat - 2/2 ams  Past Medical History  Diagnosis Date  . Myocardial infarction   . CVA (cerebral infarction)   . Diabetes mellitus   . Hypertension   . Diabetes mellitus   . Aphasia   . Dysphagia     History reviewed. No pertinent past surgical history.  History reviewed. No pertinent family history.  History  Substance Use Topics  . Smoking status: Unknown If Ever Smoked  . Smokeless tobacco: Not on file  . Alcohol Use: No      Review of Systems  Unable to perform ROS: Mental status change    Allergies  Review of patient's allergies indicates no known allergies.  Home Medications   Current Outpatient Rx  Name Route Sig Dispense Refill  . ASPIRIN EC 81 MG PO TBEC Oral Take 81 mg by mouth daily. For CVA prophylaxis.    Marland Kitchen BENZONATATE 100 MG PO CAPS Oral Take 100 mg by mouth 3 (three) times daily. For cough.    . CLOPIDOGREL BISULFATE 75 MG PO TABS Oral Take 75 mg by mouth daily with breakfast.    . EZETIMIBE-SIMVASTATIN 10-40 MG PO TABS Oral  Take 1 tablet by mouth at bedtime.    Marland Kitchen STARCH (THICKENING) PO POWD Oral Take 1 g by mouth as needed. As directed for dysphagia. (Honey thick liquids.)    . INSULIN ASPART 100 UNIT/ML [place] SOLN Subcutaneous Inject 0-10 Units into the skin 3 (three) times daily with meals. As per scale  CBG 61-150        0 UNIT CBG 151-200        2 UNIT CBG 201-250     4 UNIT CBG 251-300      6 UNIT CBG 301-350    8 UNIT CBG 351-400     10 UNIT  CBG>400 ,calll your doctor    . INSULIN GLARGINE 100 UNIT/ML [place] SOLN Subcutaneous Inject 10 Units into the skin at bedtime.    . ADULT MULTIVITAMIN W/MINERALS CH Oral Take 1 tablet by mouth daily.    Marland Kitchen NITROGLYCERIN 0.4 MG/SPRAY TL SOLN Sublingual Place 1 spray under the tongue every 5 (five) minutes as needed. Chest pain. Every 5 minutes up to 3 doses.    Marland Kitchen OXAZEPAM 15 MG PO CAPS Oral Take 30 mg by mouth at bedtime.    Marland Kitchen PROMETHAZINE HCL 25 MG PO TABS Oral Take 25 mg by mouth every 6 (six) hours as needed. Nausea/vomiting.    Marland Kitchen SITAGLIPTIN-METFORMIN HCL 50-1000 MG PO TABS Oral Take 1 tablet by mouth 2 (two)  times daily with a meal.    . SULFAMETHOXAZOLE-TMP DS 800-160 MG PO TABS Oral Take 1 tablet by mouth every 12 (twelve) hours. For bronchitis.    Marland Kitchen VALSARTAN 160 MG PO TABS Oral Take 160 mg by mouth daily.    Marland Kitchen VITAMIN C 500 MG PO TABS Oral Take 500 mg by mouth 2 (two) times daily.    Marland Kitchen ZINC SULFATE 220 MG PO CAPS Oral Take 220 mg by mouth daily.      BP 120/53  Pulse 84  Temp(Src) 98.1 F (36.7 C) (Oral)  Resp 18  SpO2 100%  Physical Exam  Nursing note and vitals reviewed. Constitutional: He appears listless. He has a sickly appearance.  HENT:  Head: Atraumatic. Not macrocephalic and not microcephalic. Head is without raccoon's eyes, without Battle's sign and without right periorbital erythema.  Eyes: Pupils are equal, round, and reactive to light. Right eye exhibits no chemosis and no discharge. Left eye exhibits no chemosis and no discharge. Right  conjunctiva is not injected. Right conjunctiva has no hemorrhage. Left conjunctiva is not injected. Left conjunctiva has no hemorrhage.       Pupils 3mm sym, tracks intermittently  Cardiovascular: Intact distal pulses.   Pulmonary/Chest: No stridor. No respiratory distress. He has decreased breath sounds.  Abdominal: Soft. Normal appearance. There is no tenderness.  Neurological: He appears listless. He displays atrophy. He displays no tremor. He exhibits abnormal muscle tone. He displays no seizure activity.       R sided hemi-paresis, per family, no significant changes. Patient does not participate in neuro exam.  He maes his L side spontaneously, but minimally.  Skin: Skin is warm and dry. There is pallor.    ED Course  Procedures (including critical care time)  Labs Reviewed  CBC - Abnormal; Notable for the following:    RBC 1.39 (*) QUESTIONABLE RESULTS, RECOMMEND RECOLLECT TO VERIFY   Hemoglobin 3.7 (*)    HCT 11.7 (*) QUESTIONABLE RESULTS, RECOMMEND RECOLLECT TO VERIFY   Platelets 74 (*)    All other components within normal limits  URINALYSIS, ROUTINE W REFLEX MICROSCOPIC - Abnormal; Notable for the following:    APPearance TURBID (*)    Ketones, ur TRACE (*)    All other components within normal limits  BASIC METABOLIC PANEL - Abnormal; Notable for the following:    Potassium 7.4 (*)    CO2 15 (*)    Glucose, Bld 151 (*)    BUN 137 (*)    Creatinine, Ser 3.70 (*)    GFR calc non Af Amer 15 (*)    GFR calc Af Amer 18 (*)    All other components within normal limits  CBC - Abnormal; Notable for the following:    WBC 13.2 (*)    Hemoglobin 12.2 (*)    HCT 37.2 (*)    All other components within normal limits  URINE MICROSCOPIC-ADD ON - Abnormal; Notable for the following:    Crystals URIC ACID CRYSTALS (*)    All other components within normal limits  POCT I-STAT, CHEM 8 - Abnormal; Notable for the following:    Potassium 7.8 (*)    Chloride 124 (*)    BUN >140 (*)     Creatinine, Ser 4.30 (*)    Glucose, Bld 147 (*)    Calcium, Ion 1.34 (*)    Hemoglobin 11.9 (*)    HCT 35.0 (*)    All other components within normal limits  TYPE AND SCREEN  ABO/RH  URINE CULTURE  BLOOD GAS, VENOUS   Ct Head Wo Contrast  01/29/2012  *RADIOLOGY REPORT*  Clinical Data: Altered mental status.  History of CVA.  CT HEAD WITHOUT CONTRAST  Technique:  Contiguous axial images were obtained from the base of the skull through the vertex without contrast.  Comparison: 12/31/2011  Findings: Evolutionary changes in the region of left MCA distribution infarct noted.  Abnormal hypodensity extends further anteriorly than was present on the prior exam, now extending slightly anterior to the frontal horn of the left lateral ventricle, and this could represent extension of infarct in this vicinity, or possibly associated cytotoxic edema tracking in this area.  The cerebellum, brain stem, and thalami appear unremarkable.  Prior infarct involves the left basal ganglia.  No hemorrhagic transformation is observed.  No hydrocephalus or significant midline shift noted.  No acute right sided findings.  Scalp edema noted posteriorly along the occiput.  The visualized paranasal sinuses appear clear.  IMPRESSION:  1.  Abnormal hypodensity associated with the left MCA distribution infarct extends further anteriorly than was shown on prior CT angiogram and MRI brain, possibly reflecting anterior extension of infarct in the left frontal lobe. 2.  No overt hemorrhagic transformation of the infarct is identified.  Original Report Authenticated By: Dellia Cloud, M.D.   Dg Chest Port 1 View  01/29/2012  *RADIOLOGY REPORT*  Clinical Data: Weakness, shortness of breath, confusion.  PORTABLE CHEST - 1 VIEW  Comparison: 01/16/2012  Findings: Stable appearance of postoperative changes in the mediastinum.  Shallow inspiration.  Normal heart size and pulmonary vascularity.  Previously demonstrated right lung base  opacity has resolved.  No focal airspace consolidation in the lungs.  No blunting of costophrenic angles.  No pneumothorax.  Old right first and second rib fractures.  Degenerative changes in the spine and shoulders.  IMPRESSION: No evidence of active pulmonary disease.  Original Report Authenticated By: Marlon Pel, M.D.     No diagnosis found.  Cardiac: 90 sr normal  Pulse ox 99% ra- normal   Date: 01/29/2012  Rate: 89  Rhythm: normal sinus rhythm  QRS Axis: normal  Intervals: normal  ST/T Wave abnormalities: T wave peaks  Conduction Disutrbances:nonspecific intraventricular conduction delay  Narrative Interpretation:   Old EKG Reviewed: changes noted  New peaked T waves  10:56 PM I discussed the patient's case with his family members, including lab abnormalities.  The patient's daughter reiterates that any, and all resuscitative measures should be performed MDM  This elderly male who is now one month status post left MCA infarct, with baseline aphasia now presents with several days of listlessness, reports of hypotension.  On my exam the patient is in no distress, but is listless, with minimal motion on his right side, which is consistent with known deficits from his stroke.  The patient's initial labs were notable for significant anemia with a 10 g hemoglobin drop in one month.  Although the patient was prepared for transfusion, repeat hemoglobin was performed to ensure accuracy, and this came back with more appropriate, consistent results.  The remainder of the patient's labs are notable for hyperkalemia and new renal dysfunction.  The patient's ECG demonstrated T wave changes consistent with hyperkalemic effects.  The patient received calcium, insulin, glucose, saline.  The patient's CT scan also demonstrated possible progression of his stroke. Given these new, concerning changes in the patient's condition I reaffirmed with the patient's family the goals of care.  The patient's  daughter notes that any and  all resuscitative measures should be performed.  Given this desire, the patient was admitted to the ICU, which required transfer to [facility] for anticipated dialysis.  CRITICAL CARE Performed by: Gerhard Munch   Total critical care time: 35  Critical care time was exclusive of separately billable procedures and treating other patients.  Critical care was necessary to treat or prevent imminent or life-threatening deterioration.  Critical care was time spent personally by me on the following activities: development of treatment plan with patient and/or surrogate as well as nursing, discussions with consultants, evaluation of patient's response to treatment, examination of patient, obtaining history from patient or surrogate, ordering and performing treatments and interventions, ordering and review of laboratory studies, ordering and review of radiographic studies, pulse oximetry and re-evaluation of patient's condition.         Gerhard Munch, MD 01/29/12 2356  Gerhard Munch, MD 01/29/12 [phone]

## 2012-01-29 NOTE — ED Notes (Addendum)
Family has arrived at bedside - discussed w/ family pt concerns - pt's daughter, Clydene Laming, states pt is from nursing facility and pt is normally response and oriented - x3 days pt's daughter has noted a decline in pt's mental state, pt has become more confused, lethargic, and weak. Pt's daughter states pt feels warm to touch to her and is concerned about a fever. Pt in no acute distress, resting comfortably in bed, non-verbal, eyes opening spontaneously.   Per [facility] Medical Record - pt is normally alert, disoriented, but can follow simple commands. Pt sent to ED for eval of hypotension and decreased LOC. Pt a fall risk - normally ambulates w/ assistance.

## 2012-01-30 ENCOUNTER — Encounter (HOSPITAL_COMMUNITY): Payer: Self-pay | Admitting: General Practice

## 2012-01-30 DIAGNOSIS — E861 Hypovolemia: Secondary | ICD-10-CM

## 2012-01-30 DIAGNOSIS — I10 Essential (primary) hypertension: Secondary | ICD-10-CM

## 2012-01-30 DIAGNOSIS — I6350 Cerebral infarction due to unspecified occlusion or stenosis of unspecified cerebral artery: Secondary | ICD-10-CM

## 2012-01-30 DIAGNOSIS — N179 Acute kidney failure, unspecified: Principal | ICD-10-CM

## 2012-01-30 DIAGNOSIS — E875 Hyperkalemia: Secondary | ICD-10-CM

## 2012-01-30 DIAGNOSIS — R1310 Dysphagia, unspecified: Secondary | ICD-10-CM | POA: Diagnosis present

## 2012-01-30 DIAGNOSIS — N19 Unspecified kidney failure: Secondary | ICD-10-CM | POA: Diagnosis present

## 2012-01-30 LAB — CBC
HCT: 30.1 % — ABNORMAL LOW (ref 39.0–52.0)
Hemoglobin: 9.8 g/dL — ABNORMAL LOW (ref 13.0–17.0)
MCH: 26.6 pg (ref 26.0–34.0)
MCHC: 32.6 g/dL (ref 30.0–36.0)

## 2012-01-30 LAB — URINE CULTURE
Colony Count: NO GROWTH
Culture: NO GROWTH

## 2012-01-30 LAB — COMPREHENSIVE METABOLIC PANEL
ALT: 10 U/L (ref 0–53)
AST: 11 U/L (ref 0–37)
Calcium: 9.4 mg/dL (ref 8.4–10.5)
GFR calc Af Amer: 25 mL/min — ABNORMAL LOW (ref >90)
Sodium: 152 mEq/L — ABNORMAL HIGH (ref 135–145)
Total Protein: 5.7 g/dL — ABNORMAL LOW (ref 6.0–8.3)

## 2012-01-30 LAB — GLUCOSE, CAPILLARY: Glucose-Capillary: 126 mg/dL — ABNORMAL HIGH (ref 70–99)

## 2012-01-30 LAB — TYPE AND SCREEN: Antibody Screen: NEGATIVE

## 2012-01-30 LAB — MRSA PCR SCREENING: MRSA by PCR: NEGATIVE

## 2012-01-30 MED ORDER — SODIUM CHLORIDE 0.9 % IV SOLN: INTRAVENOUS | Status: DC

## 2012-01-30 MED ORDER — SODIUM POLYSTYRENE SULFONATE 15 GM/60ML PO SUSP
15.0000 g | Freq: Four times a day (QID) | ORAL | Status: DC
  Administered 2012-01-30: 15 g
  Filled 2012-01-30 (×4): qty 60

## 2012-01-30 MED ORDER — INSULIN GLARGINE 100 UNIT/ML ~~LOC~~ SOLN
10.0000 [IU] | Freq: Every day | SUBCUTANEOUS | Status: DC
  Administered 2012-01-30 – 2012-01-31 (×2): 10 [IU] via SUBCUTANEOUS

## 2012-01-30 MED ORDER — BIOTENE DRY MOUTH MT LIQD
15.0000 mL | Freq: Two times a day (BID) | OROMUCOSAL | Status: DC
  Administered 2012-01-30 – 2012-02-01 (×4): 15 mL via OROMUCOSAL

## 2012-01-30 MED ORDER — INSULIN ASPART 100 UNIT/ML ~~LOC~~ SOLN
0.0000 [IU] | Freq: Three times a day (TID) | SUBCUTANEOUS | Status: DC
  Administered 2012-01-30: 2 [IU] via SUBCUTANEOUS
  Administered 2012-01-31 (×2): 3 [IU] via SUBCUTANEOUS
  Administered 2012-01-31: 2 [IU] via SUBCUTANEOUS
  Administered 2012-02-01: 5 [IU] via SUBCUTANEOUS
  Administered 2012-02-01: 2 [IU] via SUBCUTANEOUS

## 2012-01-30 MED ORDER — STARCH (THICKENING) PO POWD
ORAL | Status: DC | PRN
  Filled 2012-01-30: qty 227

## 2012-01-30 MED ORDER — HEPARIN SODIUM (PORCINE) 5000 UNIT/ML IJ SOLN
5000.0000 [IU] | Freq: Three times a day (TID) | INTRAMUSCULAR | Status: DC
  Administered 2012-01-30 – 2012-02-01 (×8): 5000 [IU] via SUBCUTANEOUS
  Filled 2012-01-30 (×11): qty 1

## 2012-01-30 MED ORDER — ASPIRIN 81 MG PO CHEW
81.0000 mg | CHEWABLE_TABLET | Freq: Every day | ORAL | Status: DC
  Administered 2012-01-30 – 2012-02-01 (×3): 81 mg via NASOGASTRIC
  Filled 2012-01-30 (×3): qty 1

## 2012-01-30 MED ORDER — CLOPIDOGREL BISULFATE 75 MG PO TABS
75.0000 mg | ORAL_TABLET | Freq: Every day | ORAL | Status: DC
  Administered 2012-01-30 – 2012-02-01 (×3): 75 mg via ORAL
  Filled 2012-01-30 (×4): qty 1

## 2012-01-30 MED ORDER — EZETIMIBE-SIMVASTATIN 10-40 MG PO TABS
1.0000 | ORAL_TABLET | Freq: Every day | ORAL | Status: DC
  Administered 2012-01-30 – 2012-01-31 (×2): 1 via ORAL
  Filled 2012-01-30 (×4): qty 1

## 2012-01-30 MED ORDER — SODIUM CHLORIDE 0.9 % IV BOLUS (SEPSIS)
1000.0000 mL | Freq: Once | INTRAVENOUS | Status: AC
  Administered 2012-01-30: 1000 mL via INTRAVENOUS

## 2012-01-30 MED ORDER — SODIUM BICARBONATE 8.4 % IV SOLN
INTRAVENOUS | Status: DC
  Administered 2012-01-30 – 2012-01-31 (×2): via INTRAVENOUS
  Filled 2012-01-30 (×7): qty 100

## 2012-01-30 MED ORDER — INSULIN ASPART 100 UNIT/ML ~~LOC~~ SOLN
0.0000 [IU] | Freq: Every day | SUBCUTANEOUS | Status: DC
  Administered 2012-01-31: 2 [IU] via SUBCUTANEOUS

## 2012-01-30 MED ORDER — PANTOPRAZOLE SODIUM 40 MG IV SOLR
40.0000 mg | INTRAVENOUS | Status: DC
  Administered 2012-01-30: 40 mg via INTRAVENOUS
  Filled 2012-01-30 (×2): qty 40

## 2012-01-30 MED ORDER — DEXTROSE 5 % IV SOLN
INTRAVENOUS | Status: DC
  Administered 2012-01-30: 03:00:00 via INTRAVENOUS
  Filled 2012-01-30 (×3): qty 150

## 2012-01-30 MED ORDER — SODIUM BICARBONATE 8.4 % IV SOLN
INTRAVENOUS | Status: DC
  Filled 2012-01-30: qty 100

## 2012-01-30 MED ORDER — PANTOPRAZOLE SODIUM 40 MG PO TBEC
40.0000 mg | DELAYED_RELEASE_TABLET | Freq: Every day | ORAL | Status: DC
  Administered 2012-01-31 – 2012-02-01 (×2): 40 mg via ORAL
  Filled 2012-01-30 (×2): qty 1

## 2012-01-30 MED ORDER — SODIUM BICARBONATE 8.4 % IV SOLN
100.0000 meq | Freq: Once | INTRAVENOUS | Status: AC
  Administered 2012-01-30: 100 meq via INTRAVENOUS
  Filled 2012-01-30: qty 100

## 2012-01-30 MED ORDER — SODIUM BICARBONATE 8.4 % IV SOLN
INTRAVENOUS | Status: AC
  Filled 2012-01-30: qty 100

## 2012-01-30 NOTE — Discharge Instructions (Signed)
STROKE/TIA DISCHARGE INSTRUCTIONS SMOKING Cigarette smoking nearly doubles your risk of having a stroke & is the single most alterable risk factor  If you smoke or have smoked in the last 12 months, you are advised to quit smoking for your health.  Most of the excess cardiovascular risk related to smoking disappears within a year of stopping.  Ask you doctor about anti-smoking medications   Quit Line: 1-800-QUIT NOW  Free Smoking Cessation Classes [phone]  CHOLESTEROL Know your levels; limit fat & cholesterol in your diet  Lipid Panel     Component Value Date/Time   CHOL 174 12/30/2011 0738   TRIG 235* 12/30/2011 0738   HDL 42 12/30/2011 0738   CHOLHDL 4.1 12/30/2011 0738   VLDL 47* 12/30/2011 0738   LDLCALC 85 12/30/2011 0738      Many patients benefit from treatment even if their cholesterol is at goal.  Goal: Total Cholesterol (CHOL) less than 160  Goal:  Triglycerides (TRIG) less than 150  Goal:  HDL greater than 40  Goal:  LDL (LDLCALC) less than 100   BLOOD PRESSURE American Stroke Association blood pressure target is less that 120/80 mm/Hg  Your discharge blood pressure is:  BP: 127/48 mmHg  Monitor your blood pressure  Limit your salt and alcohol intake  Many individuals will require more than one medication for high blood pressure  DIABETES (A1c is a blood sugar average for last 3 months) Goal HGBA1c is under 7% (HBGA1c is blood sugar average for last 3 months)  Diabetes: {STROKE DC DIABETES:22357}    Lab Results  Component Value Date   HGBA1C 14.4* 12/29/2011     Your HGBA1c can be lowered with medications, healthy diet, and exercise.  Check your blood sugar as directed by your physician  Call your physician if you experience unexplained or low blood sugars.  PHYSICAL ACTIVITY/REHABILITATION Goal is 30 minutes at least 4 days per week    {STROKE DC ACTIVITY/REHAB:22359}  Activity decreases your risk of heart attack and stroke and makes your heart  stronger.  It helps control your weight and blood pressure; helps you relax and can improve your mood.  Participate in a regular exercise program.  Talk with your doctor about the best form of exercise for you (dancing, walking, swimming, cycling).  DIET/WEIGHT Goal is to maintain a healthy weight  Your discharge diet is: NPO *** liquids Your height is:  Height: 5\' 4"  (162.6 cm) Your current weight is: Weight: 58.3 kg (128 lb 8.5 oz) Your Body Mass Index (BMI) is:  BMI (Calculated): 22.1   Following the type of diet specifically designed for you will help prevent another stroke.  Your goal weight range is:  ***  Your goal Body Mass Index (BMI) is 19-24.  Healthy food habits can help reduce 3 risk factors for stroke:  High cholesterol, hypertension, and excess weight.  RESOURCES Stroke/Support Group:  Call [phone]  they meet the 3rd Sunday of the month on the Rehab Unit at [facility], [place] ( no meetings June, July & Aug).  STROKE EDUCATION PROVIDED/REVIEWED AND GIVEN TO PATIENT Stroke warning signs and symptoms How to activate emergency medical system (call 911). Medications prescribed at discharge. Need for follow-up after discharge. Personal risk factors for stroke. Pneumonia vaccine given:   {STROKE DC YES/NO/DATE:22363} Flu vaccine given:   {STROKE DC YES/NO/DATE:22363} My questions have been answered, the writing is legible, and I understand these instructions.  I will adhere to these goals & educational materials that have  been provided to me after my discharge from the hospital.

## 2012-01-30 NOTE — H&P (Signed)
Name: [name] MRN: [id number] DOB: 1941-03-26    LOS: 1  PULMONARY / CRITICAL CARE MEDICINE  The patient is aphasic and unable to provide history, which was obtained for available medical records.     HPI:  71 yo NH resident baseline aphasic after CVA brought to [facility] with fatigue and generalized weakness.  Found to be in acute renal failure and hyperkalemic.  Transferred to [facility] for HD.  Past Medical History  Diagnosis Date  . Myocardial infarction   . CVA (cerebral infarction)   . Diabetes mellitus   . Hypertension   . Diabetes mellitus   . Aphasia   . Dysphagia    History reviewed. No pertinent past surgical history. Prior to Admission medications   Medication Sig Start Date End Date Taking? Authorizing Provider  aspirin EC 81 MG tablet Take 81 mg by mouth daily. For CVA prophylaxis.   Yes Historical Provider, MD  benzonatate (TESSALON) 100 MG capsule Take 100 mg by mouth 3 (three) times daily. For cough.   Yes Historical Provider, MD  clopidogrel (PLAVIX) 75 MG tablet Take 75 mg by mouth daily with breakfast. 01/03/12 01/02/13 Yes Sosan Forrestine Him, MD  ezetimibe-simvastatin (VYTORIN) 10-40 MG per tablet Take 1 tablet by mouth at bedtime.   Yes Historical Provider, MD  food thickener (THICK IT) POWD Take 1 g by mouth as needed. As directed for dysphagia. (Honey thick liquids.)   Yes Historical Provider, MD  insulin aspart (NOVOLOG) 100 UNIT/ML injection Inject 0-10 Units into the skin 3 (three) times daily with meals. As per scale  CBG 61-150        0 UNIT CBG 151-200        2 UNIT CBG 201-250     4 UNIT CBG 251-300      6 UNIT CBG 301-350    8 UNIT CBG 351-400     10 UNIT  CBG>400 ,calll your doctor 01/03/12 01/02/13 Yes Sosan Forrestine Him, MD  insulin glargine (LANTUS) 100 UNIT/ML injection Inject 10 Units into the skin at bedtime. 01/02/12 01/01/13 Yes Simbiso Ranga, MD  Multiple Vitamin (MULITIVITAMIN WITH MINERALS) TABS Take 1 tablet by mouth daily.   Yes Historical  Provider, MD  nitroGLYCERIN (NITROLINGUAL) 0.4 MG/SPRAY spray Place 1 spray under the tongue every 5 (five) minutes as needed. Chest pain. Every 5 minutes up to 3 doses.   Yes Historical Provider, MD  oxazepam (SERAX) 15 MG capsule Take 30 mg by mouth at bedtime.   Yes Historical Provider, MD  promethazine (PHENERGAN) 25 MG tablet Take 25 mg by mouth every 6 (six) hours as needed. Nausea/vomiting.   Yes Historical Provider, MD  sitaGLIPtan-metformin (JANUMET) 50-1000 MG per tablet Take 1 tablet by mouth 2 (two) times daily with a meal.   Yes Historical Provider, MD  sulfamethoxazole-trimethoprim (BACTRIM DS) 800-160 MG per tablet Take 1 tablet by mouth every 12 (twelve) hours. For bronchitis.   Yes Historical Provider, MD  valsartan (DIOVAN) 160 MG tablet Take 160 mg by mouth daily.   Yes Historical Provider, MD  vitamin C (ASCORBIC ACID) 500 MG tablet Take 500 mg by mouth 2 (two) times daily.   Yes Historical Provider, MD  zinc sulfate 220 MG capsule Take 220 mg by mouth daily.   Yes Historical Provider, MD   Allergies No Known Allergies  Family History History reviewed. No pertinent family history. Social History  has an unknown smoking status. He does not have any smokeless tobacco history on file. He reports that  he does not drink alcohol or use illicit drugs.  Review Of Systems:  Unable to provide.  Brief patient description:  Baseline aphasic NH resident admitted 5/13 with acute renal failure and hyperkalemia.  Events Since Admission: 5/13  Brought to [facility] with acute renal failure / hyperkalemia, transferred to [facility] for HD.  Current Status:  Vital Signs: Temp:  [98.1 F (36.7 C)-98.2 F (36.8 C)] 98.2 F (36.8 C) (05/14 0004) Pulse Rate:  [82-94] 94  (05/14 0012) Resp:  [15-18] 15  (05/14 0012) BP: (103-120)/(39-53) 111/48 mmHg (05/14 0012) SpO2:  [100 %] 100 % (05/14 0012)  Physical Examination: General:  No acute distress Neuro:  Aphasic HEENT:  Pink conjunctivae, dry  mucous membranes, dry blood nasal passages bilaterally Neck:  Supple, no JVD   Cardiovascular:  RRR, no M/R/G Lungs:  Bilateral diminished air entry, no W/R/R Abdomen:  Soft, nontender, nondistended, bowel sounds present Musculoskeletal:  Moves all extremities, no pedal edema Skin:  No rash  Active Problems:  Acute renal failure  Hyperkalemia  Hypovolemia  Uremia  CVA (cerebral infarction)  DM (diabetes mellitus), type 2, uncontrolled  Expressive aphasia  Dysphagia  ASSESSMENT AND PLAN  PULMONARY  Lab 01/29/12 2235  PHART --  PCO2ART --  PO2ART --  HCO3 15.6*  O2SAT 52.4   Ventilator Settings:   CXR:  NAD ETT:  NA  A:  Minimal oxygen requirement.  Protects airway. P:   -  No intervention required  CARDIOVASCULAR No results found for this basename: TROPONINI:5,LATICACIDVEN:5, O2SATVEN:5,PROBNP:5 in the last 168 hours ECG:  None Lines: None  A: Hemodynamically stable.  History of HTN. P:  -  Hold Diovan   RENAL  Lab 01/29/12 2224 01/29/12 2104  NA 144 141  K 7.8* 7.4*  CL 124* 111  CO2 -- 15*  BUN >140* 137*  CREATININE 4.30* 3.70*  CALCIUM -- 9.8  MG -- --  PHOS -- --   Intake/Output      05/13 0701 - 05/14 0700   Urine 300   Total Output 300   Net -300        Foley:  NA  A:  Acute renal failure.  Hyperkalemia.  Uremia.  Hypovolemia. P:   -  Treated with calcium gluconate, insulin and kayexalate -  Renal consulted >>> no indications for acute HD, want to treat conservatively -  NS 1000 mL bolus x 2 -  Bicarbonate 100 mEq bolus x 1, then gtt 150 mL/h -  Kayexalate 15 g NG q6h x 4 doses -  BMP in AM  GASTROINTESTINAL No results found for this basename: AST:5,ALT:5,ALKPHOS:5,BILITOT:5,PROT:5,ALBUMIN:5 in the last 168 hours  A:  Dysphagia. P:  -  NPO for now  -  NGT -  Swallow evaluation  HEMATOLOGIC  Lab 01/29/12 2224 01/29/12 2115 01/29/12 2010  HGB 11.9* 12.2* 3.7*  HCT 35.0* 37.2* 11.7*  PLT -- 245 74*  INR -- -- --  APTT --  -- --   A:  Mild anemia, stable. 2010 is likely erroneous result. P:  -  CBC in AM  INFECTIOUS  Lab 01/29/12 2115 01/29/12 2010  WBC 13.2* 4.1  PROCALCITON -- --   Cultures: 5/13  Urine >>>  Antibiotics: None  A:  Mild leukocytosis, no clear source of infection. P:   -  CBC in AM  ENDOCRINE No results found for this basename: GLUCAP:5 in the last 168 hours A:  DM P:   -  SSI / CNG -  D/c Sitagliptan -  Metformin / Lantus as acute renal failure  NEUROLOGIC  Head CT:  Abnormal hypodensity in MCA distribution, likely further extension of the infarct.  A:  Baseline aphasia.  Suspected extension of recent MCA CVA.  Acute encephalopathy, likely secondary to uremia. P:   -  ASA, Plavix, Vytorin as preadmission -  D/c Oxazepam  BEST PRACTICE / DISPOSITION - Level of Care:  ICU - Primary Service:  PCCm - Consultants:  Renal - Code Status:  Full - Diet:  NPO - DVT Px:  Heparin - GI Px:  Protonix - Skin Integrity:  OK - Social / Family:  Not available  Lonia Farber, M.D. Pulmonary and Critical Care Medicine [facility] Pager: [phone]  01/30/2012, 12:24 AM

## 2012-01-30 NOTE — Progress Notes (Signed)
Name: [name] MRN: [id number] DOB: May 10, 1941    LOS: 1  PULMONARY / CRITICAL CARE MEDICINE     Brief patient description:  Baseline aphasic NH resident admitted 5/13 with acute renal failure and hyperkalemia.  Transferred from [facility] ED to [facility] for possible HD > never required as K+ improved with Kayexelate   Current Status: No complaints, no distress, cognition intact  Vital Signs: Temp:  [98 F (36.7 C)-98.7 F (37.1 C)] 98.5 F (36.9 C) (05/14 1147) Pulse Rate:  [77-118] 81  (05/14 1100) Resp:  [11-22] 16  (05/14 1100) BP: (86-132)/(36-76) 123/52 mmHg (05/14 1100) SpO2:  [99 %-100 %] 100 % (05/14 1100) Weight:  [58.3 kg (128 lb 8.5 oz)] 58.3 kg (128 lb 8.5 oz) (05/14 0145)  Physical Examination: General:  No acute distress Neuro:  Aphasic HEENT:  Pink conjunctivae, dry mucous membranes, dry blood nasal passages bilaterally Neck:  Supple, no JVD   Cardiovascular:  RRR, no M/R/G Lungs:  Bilateral diminished air entry, no W/R/R Abdomen:  Soft, nontender, nondistended, bowel sounds present Musculoskeletal:  Moves all extremities, no pedal edema Skin:  No rash   BMET    Component Value Date/Time   NA 152* 01/30/2012 0500   K 5.1 01/30/2012 0500   CL 120* 01/30/2012 0500   CO2 20 01/30/2012 0500   GLUCOSE 126* 01/30/2012 0500   BUN 112* 01/30/2012 0500   CREATININE 2.81* 01/30/2012 0500   CALCIUM 9.4 01/30/2012 0500   GFRNONAA 21* 01/30/2012 0500   GFRAA 25* 01/30/2012 0500    CBC    Component Value Date/Time   WBC 9.2 01/30/2012 0500   RBC 3.69* 01/30/2012 0500   HGB 9.8* 01/30/2012 0500   HCT 30.1* 01/30/2012 0500   PLT 189 01/30/2012 0500   MCV 81.6 01/30/2012 0500   MCH 26.6 01/30/2012 0500   MCHC 32.6 01/30/2012 0500   RDW 13.6 01/30/2012 0500   No new CXR  Principal Problem:  *Acute renal failure Active Problems:  DM (diabetes mellitus), type 2, uncontrolled  Hyperkalemia  CVA (cerebral infarction)  Expressive aphasia  Dysphagia   Hypovolemia  ASSESSMENT AND PLAN Acute renal failure with hyperkalemia, metabolic acidosis - due to hypovolemia, ARB Hypernatremia.  Cr improving, Uo excellent.  Hyperkalemia resolved  - D/C further kayexalate  - IVFs adjusted - Transfer to Tele. TRH service - discussed with Dr Butler Denmark   DMII, dysphagia - Dys II, honewy thick diet orderd - Resume home dose of Lantus and SSI - Hold oral agents until renal failue resolves  Htn - Hold ARB for now    Billy Fischer, MD;  PCCM service; Mobile [phone]

## 2012-01-30 NOTE — Progress Notes (Signed)
SLP Cancellation Note  Order received for Speech Eval and treat.  Patient is NPO and has large NG tube in place for removal of stomach contents.  Mr. Bruhl has a history of moderate oropharyngeal dysphagia with last MBS on 01/03/12, which revealed aspiration of Nectar thick liquids.  He has remained on Dysphagia 2 diet with Honey thick liquids and followed by SLP at the [facility].  CT scan of 5/13 reveals possible extension of left CVA.  Recommend:  Removal of NG prior to BSE.  Patient may require a repeat MBS if s/s are noted at b/s with Dys 2 and Honey thick liquids.  Please order if you agree.  Maryjo Rochester T 01/30/2012, 10:36 AM

## 2012-01-30 NOTE — Progress Notes (Signed)
Clinical Social Work Department BRIEF PSYCHOSOCIAL ASSESSMENT 01/30/2012  Patient:  [name], [name]     Account Number:  [id number]     Admit date:  01/29/2012  Clinical Social Worker:  Juliette Mangle  Date/Time:  01/30/2012 03:30 PM  Referred by:  CSW  Date Referred:  01/30/2012 Referred for  SNF Placement   Other Referral:   Patient is from [facility]   Interview type:  Patient Other interview type:   Daughter- Environmental consultant    PSYCHOSOCIAL DATA Living Status:  FAMILY Admitted from facility:  [facility] Level of care:  Skilled Nursing Facility Primary support name:  Environmental consultant Primary support relationship to patient:  CHILD, ADULT Degree of support available:   Good    CURRENT CONCERNS Current Concerns  Post-Acute Placement   Other Concerns:    SOCIAL WORK ASSESSMENT / PLAN CSW received a referral from [place], [place], that patient is from [facility] and needs to return to finish his therapy. CSW met with patient to off fer support and discuss returning to the SNF. Due to patient's medical condition patient was unable to verbalize his thoughts or write them down. CSW spoke with his daughter at the bedside who reported that he has been at [facility] healthcare for approximately a month and is supposed to be d/c'd at the end of this month. The patient was able to nod in agreement that he would return to the facility. The patient's daughter is agreeable to this as well. CSW will continue to follow and assist with all d/c needs.   Assessment/plan status:  Psychosocial Support/Ongoing Assessment of Needs Other assessment/ plan:   Information/referral to community resources:    PATIENT'S/FAMILY'S RESPONSE TO PLAN OF CARE: Patient was unable to vocalize his thoughts. Patient's daughter was appreciative of support and information provided by CSW. CSW will continue to follow.    Sabino Niemann, MSW, [employer] [phone]

## 2012-01-30 NOTE — Evaluation (Signed)
Clinical/Bedside Swallow Evaluation Patient Details  Name: [name] MRN: [id number] Date of Birth: 07/28/1941  Today's Date: 01/30/2012 Time: 6962-9528 SLP Time Calculation (min): 40 min  Past Medical History:  Past Medical History  Diagnosis Date  . Myocardial infarction   . CVA (cerebral infarction)   . Diabetes mellitus   . Hypertension   . Diabetes mellitus   . Aphasia   . Dysphagia   . Stroke   . COPD (chronic obstructive pulmonary disease)   . Shortness of breath    Past Surgical History:  Past Surgical History  Procedure Date  . Coronary artery bypass graft   . Appendectomy   . Dental surgery    HPI: Questionable extension of left MCA-CVA per Radiologist (CT).      Assessment / Plan / Recommendation Clinical Impression  Patient appears to be safe (based on bedside clinical swallow evaluation) to resume his diet he was on PTA, Dysphagia 1 and even advance to Dys 2, with Honey thick liquids.  Patient may even be ready to advance to Nectar or possibly even thin liquids, however, an objective study is recommended prior to advancing, in light of patient's significant prior dysphagia.  Please order MBS for 5/15 if you agree.    Aspiration Risk  Moderate    Diet Recommendation Dysphagia 2 (Fine chop);Honey-thick liquid   Other  Recommendations MBS   Follow Up Recommendations  Skilled Nursing facility    Frequency and Duration            SLP Swallow Goals     Swallow Study Prior Functional Status       General Date of Onset: 01/29/12 HPI: HPI:  71 yo NH resident baseline aphasic after CVA brought to [facility] with fatigue and generalized weakness.  Found to be in acute renal failure and hyperkalemic.  Transferred to [facility] for HD.  CT scan revealed evolutionary changes with possible expansion of the Left MCA CVA. Type of Study: Bedside swallow evaluation Previous Swallow Assessment: MBS's on 12/29/11 and 01/03/12 revealed aspiration of Nectar and inability to  chew solids. Diet Prior to this Study: NPO (Was on Dysphagia 1 with Honey thick liquids at NH) Temperature Spikes Noted: No Respiratory Status: Room air History of Intubation: No Behavior/Cognition: Alert;Cooperative;Distractible;Requires cueing;Decreased sustained attention Oral Cavity - Dentition: Missing dentition (No upper teeth and several mission lower teeth; no dentures) Vision: Functional for self-feeding Patient Positioning: Upright in bed Baseline Vocal Quality: Clear Volitional Cough: Cognitively unable to elicit Volitional Swallow: Unable to elicit    Oral/Motor/Sensory Function Overall Oral Motor/Sensory Function: Impaired at baseline Labial ROM: Reduced right Labial Symmetry: Abnormal symmetry right Labial Strength: Reduced Labial Sensation: Reduced Lingual ROM: Reduced right Lingual Strength: Reduced   Ice Chips Ice chips: Not tested   Thin Liquid Thin Liquid: Within functional limits Presentation: Cup (Patient took very small sips.)    Nectar Thick Nectar Thick Liquid: Within functional limits Presentation: Cup (Very small sips)   Honey Thick Honey Thick Liquid: Within functional limits Presentation: Cup   Puree Puree: Within functional limits   Solid Solid: Impaired Presentation: Self Fed Oral Phase Impairments: Reduced labial seal;Reduced lingual movement/coordination;Impaired anterior to posterior transit Oral Phase Functional Implications: Oral residue    Maryjo Rochester T 01/30/2012,5:25 PM

## 2012-01-31 ENCOUNTER — Inpatient Hospital Stay (HOSPITAL_COMMUNITY): Payer: Medicare Other

## 2012-01-31 DIAGNOSIS — N179 Acute kidney failure, unspecified: Secondary | ICD-10-CM

## 2012-01-31 DIAGNOSIS — R1310 Dysphagia, unspecified: Secondary | ICD-10-CM

## 2012-01-31 DIAGNOSIS — E872 Acidosis: Secondary | ICD-10-CM

## 2012-01-31 DIAGNOSIS — E875 Hyperkalemia: Secondary | ICD-10-CM

## 2012-01-31 LAB — HEMOGLOBIN A1C
Hgb A1c MFr Bld: 10 % — ABNORMAL HIGH (ref <5.7)
Mean Plasma Glucose: 240 mg/dL — ABNORMAL HIGH (ref <117)

## 2012-01-31 LAB — BASIC METABOLIC PANEL
BUN: 59 mg/dL — ABNORMAL HIGH (ref 6–23)
Creatinine, Ser: 1.51 mg/dL — ABNORMAL HIGH (ref 0.50–1.35)
GFR calc Af Amer: 52 mL/min — ABNORMAL LOW (ref >90)
GFR calc non Af Amer: 45 mL/min — ABNORMAL LOW (ref >90)
Glucose, Bld: 209 mg/dL — ABNORMAL HIGH (ref 70–99)
Potassium: 4.2 mEq/L (ref 3.5–5.1)

## 2012-01-31 LAB — GLUCOSE, CAPILLARY
Glucose-Capillary: 143 mg/dL — ABNORMAL HIGH (ref 70–99)
Glucose-Capillary: 160 mg/dL — ABNORMAL HIGH (ref 70–99)

## 2012-01-31 LAB — CBC
Hemoglobin: 9.4 g/dL — ABNORMAL LOW (ref 13.0–17.0)
MCH: 26.7 pg (ref 26.0–34.0)
MCHC: 32.5 g/dL (ref 30.0–36.0)
RDW: 13.6 % (ref 11.5–15.5)

## 2012-01-31 MED ORDER — DEXTROSE 5 % IV SOLN
INTRAVENOUS | Status: DC
  Administered 2012-01-31: 75 mL via INTRAVENOUS
  Administered 2012-02-01: 02:00:00 via INTRAVENOUS

## 2012-01-31 NOTE — Progress Notes (Signed)
Subjective: No new complaints. Eating lunch.   Objective: Weight change: 0.6 kg (1 lb 5.2 oz)  Intake/Output Summary (Last 24 hours) at 01/31/12 1242 Last data filed at 01/31/12 0935  Gross per 24 hour  Intake    420 ml  Output   1545 ml  Net  -1125 ml    Filed Vitals:   01/31/12 0557  BP: 121/54  Pulse: 65  Temp: 98.3 F (36.8 C)  Resp: 16   On exam  He is alert afebrile comfortable  CVS RRR S1S2 Heard Lungs: decreased air entry bilateral Abdomen: soft NT ND BS+ Extremities: no pedal edema.   Lab Results: Results for orders placed during the hospital encounter of 01/29/12 (from the past 24 hour(s))  GLUCOSE, CAPILLARY     Status: Abnormal   Collection Time   01/30/12  5:33 PM      Component Value Range   Glucose-Capillary 148 (*) 70 - 99 (mg/dL)  GLUCOSE, CAPILLARY     Status: Abnormal   Collection Time   01/30/12  8:59 PM      Component Value Range   Glucose-Capillary 115 (*) 70 - 99 (mg/dL)   Comment 1 Notify RN     Comment 2 Documented in Chart    BASIC METABOLIC PANEL     Status: Abnormal   Collection Time   01/31/12  5:10 AM      Component Value Range   Sodium 150 (*) 135 - 145 (mEq/L)   Potassium 4.2  3.5 - 5.1 (mEq/L)   Chloride 116 (*) 96 - 112 (mEq/L)   CO2 28  19 - 32 (mEq/L)   Glucose, Bld 209 (*) 70 - 99 (mg/dL)   BUN 59 (*) 6 - 23 (mg/dL)   Creatinine, Ser 4.09 (*) 0.50 - 1.35 (mg/dL)   Calcium 9.1  8.4 - 81.1 (mg/dL)   GFR calc non Af Amer 45 (*) >90 (mL/min)   GFR calc Af Amer 52 (*) >90 (mL/min)  CBC     Status: Abnormal   Collection Time   01/31/12  5:10 AM      Component Value Range   WBC 6.9  4.0 - 10.5 (K/uL)   RBC 3.52 (*) 4.22 - 5.81 (MIL/uL)   Hemoglobin 9.4 (*) 13.0 - 17.0 (g/dL)   HCT 91.4 (*) 78.2 - 52.0 (%)   MCV 82.1  78.0 - 100.0 (fL)   MCH 26.7  26.0 - 34.0 (pg)   MCHC 32.5  30.0 - 36.0 (g/dL)   RDW 95.6  21.3 - 08.6 (%)   Platelets 154  150 - 400 (K/uL)  HEMOGLOBIN A1C     Status: Abnormal   Collection Time   01/31/12   5:10 AM      Component Value Range   Hemoglobin A1C 10.0 (*) <5.7 (%)   Mean Plasma Glucose 240 (*) <117 (mg/dL)  GLUCOSE, CAPILLARY     Status: Abnormal   Collection Time   01/31/12  6:48 AM      Component Value Range   Glucose-Capillary 176 (*) 70 - 99 (mg/dL)   Comment 1 Notify RN     Comment 2 Documented in Chart    GLUCOSE, CAPILLARY     Status: Abnormal   Collection Time   01/31/12 11:24 AM      Component Value Range   Glucose-Capillary 143 (*) 70 - 99 (mg/dL)   Comment 1 Notify RN     Comment 2 Documented in Chart    GLUCOSE, CAPILLARY  Status: Abnormal   Collection Time   01/31/12  4:08 PM      Component Value Range   Glucose-Capillary 160 (*) 70 - 99 (mg/dL)   Comment 1 Notify RN       Micro Results: Recent Results (from the past 240 hour(s))  URINE CULTURE     Status: Normal   Collection Time   01/29/12  8:23 PM      Component Value Range Status Comment   Specimen Description URINE, CATHETERIZED   Final    Special Requests NONE   Final    Culture  Setup Time [id number]   Final    Colony Count NO GROWTH   Final    Culture NO GROWTH   Final    Report Status 01/30/2012 FINAL   Final   MRSA PCR SCREENING     Status: Normal   Collection Time   01/30/12  1:14 AM      Component Value Range Status Comment   MRSA by PCR NEGATIVE  NEGATIVE  Final     Studies/Results: Ct Head Wo Contrast  01/29/2012  *RADIOLOGY REPORT*  Clinical Data: Altered mental status.  History of CVA.  CT HEAD WITHOUT CONTRAST  Technique:  Contiguous axial images were obtained from the base of the skull through the vertex without contrast.  Comparison: 12/31/2011  Findings: Evolutionary changes in the region of left MCA distribution infarct noted.  Abnormal hypodensity extends further anteriorly than was present on the prior exam, now extending slightly anterior to the frontal horn of the left lateral ventricle, and this could represent extension of infarct in this vicinity, or possibly associated  cytotoxic edema tracking in this area.  The cerebellum, brain stem, and thalami appear unremarkable.  Prior infarct involves the left basal ganglia.  No hemorrhagic transformation is observed.  No hydrocephalus or significant midline shift noted.  No acute right sided findings.  Scalp edema noted posteriorly along the occiput.  The visualized paranasal sinuses appear clear.  IMPRESSION:  1.  Abnormal hypodensity associated with the left MCA distribution infarct extends further anteriorly than was shown on prior CT angiogram and MRI brain, possibly reflecting anterior extension of infarct in the left frontal lobe. 2.  No overt hemorrhagic transformation of the infarct is identified.  Original Report Authenticated By: Dellia Cloud, M.D.   Dg Chest Port 1 View  01/29/2012  *RADIOLOGY REPORT*  Clinical Data: Weakness, shortness of breath, confusion.  PORTABLE CHEST - 1 VIEW  Comparison: 01/16/2012  Findings: Stable appearance of postoperative changes in the mediastinum.  Shallow inspiration.  Normal heart size and pulmonary vascularity.  Previously demonstrated right lung base opacity has resolved.  No focal airspace consolidation in the lungs.  No blunting of costophrenic angles.  No pneumothorax.  Old right first and second rib fractures.  Degenerative changes in the spine and shoulders.  IMPRESSION: No evidence of active pulmonary disease.  Original Report Authenticated By: Marlon Pel, M.D.   Dg Chest Port 1 View  01/16/2012  *RADIOLOGY REPORT*  Clinical Data: Cough, diabetic.  PORTABLE CHEST - 1 VIEW  Comparison: 12/28/2011  Findings: Hypoaeration results in interstitial and vascular crowding.  Minimal right lower lobe opacity medially.  No pleural effusion or pneumothorax.  No acute osseous abnormality. Status post median sternotomy and CABG.  Fractured superior most sternal wire is unchanged.  IMPRESSION: Minimal right lower lobe opacity; atelectasis versus infiltrate.  Original Report  Authenticated By: Waneta Martins, M.D.   Dg Swallowing Func-no Report  01/03/2012  CLINICAL DATA: Dysphagia   FLUOROSCOPY FOR SWALLOWING FUNCTION STUDY:  Fluoroscopy was provided for swallowing function study, which was  administered by a speech pathologist.  Final results and recommendations  from this study are contained within the speech pathology report.     Medications: Scheduled Meds:   . antiseptic oral rinse  15 mL Mouth Rinse BID  . aspirin  81 mg Per NG tube Daily  . clopidogrel  75 mg Oral Q breakfast  . ezetimibe-simvastatin  1 tablet Oral q1800  . heparin  5,000 Units Subcutaneous Q8H  . insulin aspart  0-15 Units Subcutaneous TID WC  . insulin aspart  0-5 Units Subcutaneous QHS  . insulin glargine  10 Units Subcutaneous QHS  . pantoprazole  40 mg Oral Q1200  . sodium bicarbonate       Continuous Infusions:   . dextrose 75 mL (01/31/12 1102)  . DISCONTD:  sodium bicarbonate infusion 1000 mL 150 mL/hr at 01/31/12 0742   PRN Meds:.food thickener  Assessment/Plan: Patient Active Hospital Problem List: Acute renal failure (01/29/2012) With metabolic acidosis : has improved. ARB was discontinued. On IV fluids. Hyperkalemia resolved.   CVA (cerebral infarction) (12/28/2011) Continue with plavix and vytorin.   DM (diabetes mellitus), type 2, uncontrolled (12/28/2011)   Well controlled.  Resume home dose of lantus and SSI.  CBG (last 3)   Basename 01/31/12 1608 01/31/12 1124 01/31/12 0648  GLUCAP 160* 143* 176*     Expressive aphasia (01/02/2012) AT BASELINE. Hyperkalemia (01/29/2012) Resolved with kayexlate. Hypovolemia (01/30/2012) Resolved with IV fluids.  Dysphagia (01/30/2012) SLP evaluation done and he underwent MBS , SLP recommended Dysphagia 2 diet with nectar thick liquids.   PT/OT Evaluation and possible d/c to SNF in 1 to 2 days.    LOS: 2 days   [name] 01/31/2012, 12:42 PM

## 2012-01-31 NOTE — Procedures (Signed)
Objective Swallowing Evaluation: Modified Barium Swallowing Study  Patient Details  Name: [name] MRN: [id number] Date of Birth: April 23, 1941  Today's Date: 01/31/2012 Time: 1205-1220 SLP Time Calculation (min): 15 min  Past Medical History:  Past Medical History  Diagnosis Date  . Myocardial infarction   . CVA (cerebral infarction)   . Diabetes mellitus   . Hypertension   . Diabetes mellitus   . Aphasia   . Dysphagia   . Stroke   . COPD (chronic obstructive pulmonary disease)   . Shortness of breath    Past Surgical History:  Past Surgical History  Procedure Date  . Coronary artery bypass graft   . Appendectomy   . Dental surgery    HPI:  HPI:  71 yo NH resident baseline aphasic after CVA brought to [facility] with fatigue and generalized weakness.  Found to be in acute renal failure and hyperkalemic.  Transferred to [facility] for HD.  CT scan revealed evolutionary changes with possible expansion of the Left MCA CVA.     Assessment / Plan / Recommendation Clinical Impression  Dysphagia Diagnosis: Moderate oral phase dysphagia;Moderate pharyngeal phase dysphagia Clinical impression: Patient presents with a moderate sensory-motor based oropharyngeal dysphagia marked by oral weakness and discoordination resutling in decreased bolus cohesion, delayed oral transit, and loss of bolus into the pharynx prior ot the initiation of the swallow. This combination results in aspiration and penetration of thin liquids, not prevented with the use of varied sip sizes. Baseline aphasia does not allow for use of complex compensatory strategies. Patient was able to fully protect his airway during today's study with solids and nectar thick liquids. Weak base of tongue results in mild-moderate vallecular residuals however these residuals clear with combination of spontaneous dry swallows and liquid washes. Patient judged safe to advance diet to chopped with nectar thick liquids.     Treatment  Recommendation  Therapy as outlined in treatment plan below    Diet Recommendation Dysphagia 2 (Fine chop);Nectar-thick liquid      Follow Up Recommendations  Skilled Nursing facility    Frequency and Duration min 2x/week  2 weeks   Pertinent Vitals/Pain n/a    SLP Swallow Goals Patient will consume recommended diet without observed clinical signs of aspiration with: Moderate assistance Swallow Study Goal #1 - Progress: Not Met Patient will utilize recommended strategies during swallow to increase swallowing safety with: Moderate assistance Swallow Study Goal #2 - Progress: Not met   General Date of Onset: 01/29/12 HPI: HPI:  71 yo NH resident baseline aphasic after CVA brought to [facility] with fatigue and generalized weakness.  Found to be in acute renal failure and hyperkalemic.  Transferred to [facility] for HD.  CT scan revealed evolutionary changes with possible expansion of the Left MCA CVA. Type of Study: Modified Barium Swallowing Study Previous Swallow Assessment: MBS's on 12/29/11 and 01/03/12 revealed aspiration of Nectar and inability to chew solids. Diet Prior to this Study: Dysphagia 2 (chopped);Honey-thick liquids Temperature Spikes Noted: No Respiratory Status: Room air History of Intubation: No Behavior/Cognition: Alert;Cooperative;Distractible;Requires cueing;Decreased sustained attention Oral Cavity - Dentition: Missing dentition (No upper teeth and several mission lower teeth; no dentures) Oral Motor / Sensory Function: Impaired - see Bedside swallow eval Vision: Functional for self-feeding Patient Positioning: Upright in bed Baseline Vocal Quality: Clear Volitional Cough: Weak Volitional Swallow: Unable to elicit Anatomy: Within functional limits Pharyngeal Secretions: Not observed secondary MBS    Jahanna Raether MA, CCC-SLP [phone]  [name] [name] 01/31/2012, 1:49 PM

## 2012-01-31 NOTE — Evaluation (Signed)
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  Unable to determine level of indepence prior to admission. RN reports pt from SNF.   Suggesting return to SNF upon D/c from hospital.     PT Assessment  Patient needs continued PT services    Follow Up Recommendations  Skilled nursing facility    Barriers to Discharge None      lEquipment Recommendations  Defer to next venue    Recommendations for Other Services     Frequency Min 2X/week    Precautions / Restrictions Precautions Precautions: Fall Restrictions Weight Bearing Restrictions: No   Pertinent Vitals/Pain No c/o pain      Mobility  Bed Mobility Bed Mobility: Supine to Sit;Sit to Supine Supine to Sit: 6: Modified independent (Device/Increase time);HOB flat Sit to Supine: 6: Modified independent (Device/Increase time);HOB flat Transfers Transfers: Sit to Stand;Stand to Sit;Stand Pivot Transfers Sit to Stand: 4: Min assist;From bed;From chair/3-in-1;With upper extremity assist Stand to Sit: 4: Min guard;To bed;To chair/3-in-1 Stand Pivot Transfers: 4: Min assist Details for Transfer Assistance: Assistance for balance pt presents with posterior lean and bilateral LE weakness.  Ambulation/Gait Ambulation/Gait Assistance: Not tested (comment) Stairs: No Wheelchair Mobility Wheelchair Mobility: No    Exercises     PT Diagnosis: Difficulty walking;Generalized weakness  PT Problem List: Decreased mobility;Decreased strength;Decreased balance PT Treatment Interventions: DME instruction;Gait training;Functional mobility training;Therapeutic activities;Neuromuscular re-education;Balance training;Patient/family education   PT Goals Acute Rehab PT Goals PT Goal Formulation:  With patient Time For Goal Achievement: 02/14/12 Potential to Achieve Goals: Fair Pt will Transfer Bed to Chair/Chair to Bed: with supervision PT Transfer Goal: Bed to Chair/Chair to Bed - Progress: Goal set today Pt will Stand: with supervision;3 - 5 min;with bilateral upper extremity support PT Goal: Stand - Progress: Goal set today Pt will Ambulate: 1 - 15 feet;with min assist;with rolling walker PT Goal: Ambulate - Progress: Goal set today  Visit Information  Last PT Received On: 01/31/12    Subjective Data  Patient Stated Goal: difficult to understand pt.     Prior Functioning  Home Living Lives With: Spouse Available Help at Discharge: Family Type of Home: Apartment Home Access: Level entry Home Layout: One level Bathroom Shower/Tub: Forensic scientist: Standard Bathroom Accessibility: Yes How Accessible: Accessible via wheelchair;Accessible via walker Home Adaptive Equipment: Wheelchair - manual Prior Function Level of Independence:  (Unclear) Driving: No Vocation: Retired Comments: difficulty Communication Communication: Expressive difficulties (from Old CVA) Dominant Hand: Right    Cognition  Overall Cognitive Status: Difficult to assess Difficult to assess due to: Impaired communication Arousal/Alertness: Awake/alert Orientation Level: Appears intact for tasks assessed Behavior During Session: [facility] for tasks performed    Extremity/Trunk Assessment Right Upper Extremity Assessment RUE ROM/Strength/Tone: Deficits RUE ROM/Strength/Tone Deficits: 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   End  of Session PT - End of Session Equipment Utilized During Treatment: Gait belt Activity Tolerance: Treatment limited secondary to medical complications (Comment) (weakness from old CVA. ) Patient left: in chair;with nursing in room;with call bell/phone within reach Nurse Communication: Mobility status   [name] 01/31/2012, 3:50 PM  [name] DPT [phone]

## 2012-01-31 NOTE — Plan of Care (Signed)
Problem: Consults Goal: General Medical Patient Education See Patient Education Module for specific education.  Outcome: Completed/Met Date Met:  01/31/12 Came in with Acute renal failure Pt has expressive aphasigia from past stroke unable to access learning level of understanding lives @  Goal: Nutrition Consult-if indicated Outcome: Completed/Met Date Met:  01/31/12 SLP eval  pt on Dysphagia 2 , Nectar thick liquids - using Thick it product from pharmacy to add to liquids.  Problem: Phase I Progression Outcomes Goal: OOB as tolerated unless otherwise ordered Outcome: Completed/Met Date Met:  01/31/12 Up with nursing and or Physical therapy QD

## 2012-01-31 NOTE — Plan of Care (Signed)
Problem: Consults Goal: General Medical Patient Education See Patient Education Module for specific education.  Outcome: Completed/Met Date Met:  01/31/12 Pt came in with acute renal failure. Has Hx of stroke with expressive aphasia hard to access level of understanding Lives @ a SNF - [facility]

## 2012-01-31 NOTE — Progress Notes (Signed)
CSW spoke with [employer] and patient can return to the facility. CSW will continue to assist with d/c planning.  Sabino Niemann, MSW, [employer] [phone]

## 2012-02-01 DIAGNOSIS — E875 Hyperkalemia: Secondary | ICD-10-CM

## 2012-02-01 DIAGNOSIS — E872 Acidosis: Secondary | ICD-10-CM

## 2012-02-01 DIAGNOSIS — R1310 Dysphagia, unspecified: Secondary | ICD-10-CM

## 2012-02-01 DIAGNOSIS — N179 Acute kidney failure, unspecified: Secondary | ICD-10-CM

## 2012-02-01 LAB — BASIC METABOLIC PANEL
CO2: 24 mEq/L (ref 19–32)
Chloride: 104 mEq/L (ref 96–112)
GFR calc non Af Amer: 65 mL/min — ABNORMAL LOW (ref >90)
Glucose, Bld: 178 mg/dL — ABNORMAL HIGH (ref 70–99)
Potassium: 4.4 mEq/L (ref 3.5–5.1)
Sodium: 135 mEq/L (ref 135–145)

## 2012-02-01 LAB — GLUCOSE, CAPILLARY: Glucose-Capillary: 216 mg/dL — ABNORMAL HIGH (ref 70–99)

## 2012-02-01 MED ORDER — VALSARTAN 160 MG PO TABS: 160.0000 mg | ORAL_TABLET | Freq: Every day | ORAL | Status: DC

## 2012-02-01 NOTE — Progress Notes (Signed)
Pt discharged per ambulance to [facility] . Pt did not have any personal belongings. Family called aware of transfer. Information packet given to ambulance attendants. Pt awake and alert nodded his head as to situation and where he was going. Marisa Cyphers RN

## 2012-02-01 NOTE — Progress Notes (Signed)
Speech Language Pathology Dysphagia Treatment Patient Details Name: [name] MRN: [id number] DOB: March 20, 1941 Today's Date: 02/01/2012 Time: 7829-5621 SLP Time Calculation (min): 15 min  Assessment / Plan / Recommendation Clinical Impression  F/u diet tolerance assessment revealed no overt s/s of aspiration with 4 ounces of nectar thick liquids with SLP providing moderate verbal cueing for small single sips. Patient mildly impulsive with intake, expressing satisfaction with nectar-thick liquids. Daughter present and educated regarding change in diet s/p MBS results, aspiration precautions, safe swallowing strategies, and plan. She verbalized understanding. Overall, patient appears to be tolerating current diet. Will continue to f/u.     Diet Recommendation  Continue with Current Diet: Dysphagia 2 (fine chop);Nectar-thick liquid    SLP Plan Continue with current plan of care   Pertinent Vitals/Pain n/a   Swallowing Goals  SLP Swallowing Goals Patient will consume recommended diet without observed clinical signs of aspiration with: Moderate assistance Swallow Study Goal #1 - Progress: Progressing toward goal Patient will utilize recommended strategies during swallow to increase swallowing safety with: Moderate assistance Swallow Study Goal #2 - Progress: Progressing toward goal  General Temperature Spikes Noted: No Respiratory Status: Room air Behavior/Cognition: Alert;Cooperative;Distractible;Requires cueing;Decreased sustained attention Oral Cavity - Dentition: Missing dentition (No upper teeth and several mission lower teeth; no dentures) Patient Positioning: Upright in bed   Dysphagia Treatment Treatment focused on: Skilled observation of diet tolerance;Patient/family/caregiver education Treatment Methods/Modalities: Skilled observation Patient observed directly with PO's: Yes Type of PO's observed: Nectar-thick liquids Feeding: Able to feed self Liquids provided via:  Cup Type of cueing: Verbal Amount of cueing: Moderate  Mario Kenton MA, CCC-SLP [phone]  [name] 02/01/2012, 9:35 AM

## 2012-02-01 NOTE — Progress Notes (Signed)
Clinical social worker assisted with patient discharge to skilled nursing facility, [facility].  CSW addressed all family questions and concerns. CSW copied chart and added all important documents. CSW also set up patient transportation with [facility]. Clinical Social Worker will sign off for now as social work intervention is no longer needed.   Tresha Muzio, MSW, LCSWA [phone]  

## 2012-02-01 NOTE — Discharge Summary (Signed)
DISCHARGE SUMMARY  [name]  MR#: [id number]  DOB:Jul 16, 1941  Date of Admission: 01/29/2012 Date of Discharge: 02/01/2012  Attending Physician:Josafat Enrico  Patient's MWU:[id number],UUVOZDGU C, MD, MD  Consults: -pccm Renal.  Discharge Diagnoses: Present on Admission:  .Acute renal failure .Hyperkalemia .Hypovolemia .Uremia .Dysphagia .CVA (cerebral infarction) .DM (diabetes mellitus), type 2, uncontrolled .Expressive aphasia   Initial presentation: 71 yo NH resident baseline aphasic after CVA brought to [facility] with fatigue and generalized weakness. Found to be in acute renal failure and hyperkalemic. Transferred to [facility] for HD   Hospital Course: Acute renal failure With metabolic acidosis and hyperkalemia:  (01/29/2012) Most likely secondary to hypovolemia, with ARB.  He was started in IV fluids with normal saline after being given 2 liters boluses of normal saline, along with bicarbonate bolus and maintenance rate. Kayexalate was started every 6 hours to bring the potassium level down. Renal consulted and recommended no indications for HD. He was treated conservatively with fluids and bicarbonate drip. In 24 hours his renal function improved and today his creatinine is at baseline. We are currently holding his losartan, which can be restarted in one week after a BMP and with normal renal function.    CVA (cerebral infarction) (12/28/2011)  Continue with plavix and vytorin.   DM (diabetes mellitus), type 2, uncontrolled (12/28/2011) Well controlled.  Resume home dose of lantus and SSI.  CBG (last 3)   Basename 02/01/12 1119 02/01/12 0625 01/31/12 2053  GLUCAP 203* 138* 216*   Better controlled.      Expressive aphasia (01/02/2012) AT BASELINE. Hyperkalemia (01/29/2012) Resolved with kayexlate. Hypovolemia (01/30/2012) Resolved with IV fluids.  Dysphagia (01/30/2012) SLP evaluation done and he underwent MBS , SLP recommended Dysphagia 2 diet with nectar thick  liquids. Would continue the same at the SNF   Medication List  As of 02/01/2012  1:20 PM   STOP taking these medications         sulfamethoxazole-trimethoprim 800-160 MG per tablet         TAKE these medications         aspirin EC 81 MG tablet   Take 81 mg by mouth daily. For CVA prophylaxis.      benzonatate 100 MG capsule   Commonly known as: TESSALON   Take 100 mg by mouth 3 (three) times daily. For cough.      clopidogrel 75 MG tablet   Commonly known as: PLAVIX   Take 75 mg by mouth daily with breakfast.      ezetimibe-simvastatin 10-40 MG per tablet   Commonly known as: VYTORIN   Take 1 tablet by mouth at bedtime.      food thickener Powd   Commonly known as: THICK IT   Take 1 g by mouth as needed. As directed for dysphagia. (Honey thick liquids.)      insulin aspart 100 UNIT/ML injection   Commonly known as: novoLOG   Inject 0-10 Units into the skin 3 (three) times daily with meals. As per scale   CBG 61-150        0 UNIT  CBG 151-200        2 UNIT  CBG 201-250     4 UNIT  CBG 251-300      6 UNIT  CBG 301-350    8 UNIT  CBG 351-400     10 UNIT   CBG>400 ,calll your doctor      insulin glargine 100 UNIT/ML injection   Commonly known as: LANTUS   Inject 10  Units into the skin at bedtime.      mulitivitamin with minerals Tabs   Take 1 tablet by mouth daily.      nitroGLYCERIN 0.4 MG/SPRAY spray   Commonly known as: NITROLINGUAL   Place 1 spray under the tongue every 5 (five) minutes as needed. Chest pain. Every 5 minutes up to 3 doses.      oxazepam 15 MG capsule   Commonly known as: SERAX   Take 30 mg by mouth at bedtime.      promethazine 25 MG tablet   Commonly known as: PHENERGAN   Take 25 mg by mouth every 6 (six) hours as needed. Nausea/vomiting.      sitaGLIPtan-metformin 50-1000 MG per tablet   Commonly known as: JANUMET   Take 1 tablet by mouth 2 (two) times daily with a meal.      valsartan 160 MG tablet   Commonly known as: DIOVAN    Take 1 tablet (160 mg total) by mouth daily.      vitamin C 500 MG tablet   Commonly known as: ASCORBIC ACID   Take 500 mg by mouth 2 (two) times daily.      zinc sulfate 220 MG capsule   Take 220 mg by mouth daily.             Day of Discharge BP 131/77  Pulse 78  Temp(Src) 98.1 F (36.7 C) (Oral)  Resp 16  Ht 5' (1.524 m)  Wt 58.4 kg (128 lb 12 oz)  BMI 25.14 kg/m2  SpO2 98%  Physical Exam: On exam  He is alert afebrile comfortable  CVS RRR S1S2 Heard  Lungs: decreased air entry bilateral  Abdomen: soft NT ND BS+  Extremities: no pedal edema.    Results for orders placed during the hospital encounter of 01/29/12 (from the past 24 hour(s))  GLUCOSE, CAPILLARY     Status: Abnormal   Collection Time   01/31/12  4:08 PM      Component Value Range   Glucose-Capillary 160 (*) 70 - 99 (mg/dL)   Comment 1 Notify RN    GLUCOSE, CAPILLARY     Status: Abnormal   Collection Time   01/31/12  8:53 PM      Component Value Range   Glucose-Capillary 216 (*) 70 - 99 (mg/dL)   Comment 1 Notify RN     Comment 2 Documented in Chart    GLUCOSE, CAPILLARY     Status: Abnormal   Collection Time   02/01/12  6:25 AM      Component Value Range   Glucose-Capillary 138 (*) 70 - 99 (mg/dL)   Comment 1 Notify RN     Comment 2 Documented in Chart    GLUCOSE, CAPILLARY     Status: Abnormal   Collection Time   02/01/12 11:19 AM      Component Value Range   Glucose-Capillary 203 (*) 70 - 99 (mg/dL)   Comment 1 Notify RN    BASIC METABOLIC PANEL     Status: Abnormal   Collection Time   02/01/12 12:11 PM      Component Value Range   Sodium 135  135 - 145 (mEq/L)   Potassium 4.4  3.5 - 5.1 (mEq/L)   Chloride 104  96 - 112 (mEq/L)   CO2 24  19 - 32 (mEq/L)   Glucose, Bld 178 (*) 70 - 99 (mg/dL)   BUN 28 (*) 6 - 23 (mg/dL)   Creatinine, Ser 8.65  0.50 -  1.35 (mg/dL)   Calcium 8.5  8.4 - 47.8 (mg/dL)   GFR calc non Af Amer 65 (*) >90 (mL/min)   GFR calc Af Amer 76 (*) >90 (mL/min)      Disposition: SNF   Follow-up Appts: Discharge Orders    Future Orders Please Complete By Expires   Diet - low sodium heart healthy      Discharge instructions      Comments:   Follow up with PCP/ MD  In one week. Check BMP in one week   Activity as tolerated - No restrictions           Tests Needing Follow-up: BMP, in one week.  If creatinine is normal,then resume losartan.   Time spent in discharge (includes decision making & examination of pt): 62 minutes  Signed: Judah Carchi 02/01/2012, 1:20 PM

## 2012-03-03 ENCOUNTER — Encounter (HOSPITAL_COMMUNITY): Payer: Self-pay

## 2012-03-03 ENCOUNTER — Emergency Department (INDEPENDENT_AMBULATORY_CARE_PROVIDER_SITE_OTHER)
Admission: EM | Admit: 2012-03-03 | Discharge: 2012-03-03 | Disposition: A | Discharge status: Home / Self Care | Payer: Medicare Other | Source: Home / Self Care | Attending: Emergency Medicine | Admitting: Emergency Medicine

## 2012-03-03 DIAGNOSIS — H6690 Otitis media, unspecified, unspecified ear: Secondary | ICD-10-CM

## 2012-03-03 MED ORDER — AMOXICILLIN 500 MG PO CAPS: 500.0000 mg | ORAL_CAPSULE | Freq: Three times a day (TID) | ORAL | Status: DC

## 2012-03-03 MED ORDER — ANTIPYRINE-BENZOCAINE 5.4-1.4 % OT SOLN: 3.0000 [drp] | OTIC | Status: DC | PRN

## 2012-03-03 NOTE — ED Notes (Signed)
Pt has earache that started this am.

## 2012-03-03 NOTE — Discharge Instructions (Signed)
Otitis Media, Adult A middle ear infection is an infection in the space behind the eardrum. It often happens along with a cold. It is caused by a germ that starts growing in that space. Your neck may feel puffy (swollen) on the side of the ear infection. HOME CARE  Take your medicine as told. Finish it even if you start to feel better.   Nose medicine (nasal decongestant) may help the tube that connects the ear and throat (eustachian tube) drain better. It may also help with discomfort.   Follow up with your doctor in 10 to 14 days or as told by your doctor. This is to make sure the infection is gone.  GET HELP RIGHT AWAY IF:   You do not start to feel better in 2 to 3 days.   You have pain that is not helped with medicine.   You cannot use the medicine as told.   You feel worse instead of better.   You develop puffiness, redness, or pain around the ear.   You get a stiff neck.  MAKE SURE YOU:   Understand these instructions.   Will watch your condition.   Will get help right away if you are not doing well or get worse.  Document Released: 02/21/2008 Document Revised: 08/24/2011 Document Reviewed: 02/21/2008 ExitCare Patient Information 2012 [employer]. 

## 2012-03-03 NOTE — ED Provider Notes (Signed)
History     CSN: [id number]  Arrival date & time 03/03/12  1739   First MD Initiated Contact with Patient 03/03/12 1848      No chief complaint on file.   (Consider location/radiation/quality/duration/timing/severity/associated sxs/prior treatment) Patient is a 71 y.o. male presenting with ear pain. The history is provided by the patient. No language interpreter was used.  Otalgia This is a new problem. The current episode started more than 2 days ago. There is pain in both ears. The problem occurs constantly. The problem has not changed since onset.There has been no fever. The pain is at a severity of 5/10. The pain is moderate. Associated symptoms include ear discharge. His past medical history does not include hearing loss.    Past Medical History  Diagnosis Date  . Myocardial infarction   . CVA (cerebral infarction)   . Diabetes mellitus   . Hypertension   . Diabetes mellitus   . Aphasia   . Dysphagia   . Stroke   . COPD (chronic obstructive pulmonary disease)   . Shortness of breath     Past Surgical History  Procedure Date  . Coronary artery bypass graft   . Appendectomy   . Dental surgery     No family history on file.  History  Substance Use Topics  . Smoking status: Former Smoker -- 16 years    Types: Cigarettes    Quit date: 12/27/2011  . Smokeless tobacco: Never Used  . Alcohol Use: No      Review of Systems  HENT: Positive for ear pain and ear discharge.   All other systems reviewed and are negative.    Allergies  Review of patient's allergies indicates no known allergies.  Home Medications   Current Outpatient Rx  Name Route Sig Dispense Refill  . ASPIRIN EC 81 MG PO TBEC Oral Take 81 mg by mouth daily. For CVA prophylaxis.    Marland Kitchen BENZONATATE 100 MG PO CAPS Oral Take 100 mg by mouth 3 (three) times daily. For cough.    . CLOPIDOGREL BISULFATE 75 MG PO TABS Oral Take 75 mg by mouth daily with breakfast.    . EZETIMIBE-SIMVASTATIN 10-40 MG  PO TABS Oral Take 1 tablet by mouth at bedtime.    Marland Kitchen STARCH (THICKENING) PO POWD Oral Take 1 g by mouth as needed. As directed for dysphagia. (Honey thick liquids.)    . INSULIN ASPART 100 UNIT/ML [place] SOLN Subcutaneous Inject 0-10 Units into the skin 3 (three) times daily with meals. As per scale  CBG 61-150        0 UNIT CBG 151-200        2 UNIT CBG 201-250     4 UNIT CBG 251-300      6 UNIT CBG 301-350    8 UNIT CBG 351-400     10 UNIT  CBG>400 ,calll your doctor    . INSULIN GLARGINE 100 UNIT/ML [place] SOLN Subcutaneous Inject 10 Units into the skin at bedtime.    . ADULT MULTIVITAMIN W/MINERALS CH Oral Take 1 tablet by mouth daily.    Marland Kitchen NITROGLYCERIN 0.4 MG/SPRAY TL SOLN Sublingual Place 1 spray under the tongue every 5 (five) minutes as needed. Chest pain. Every 5 minutes up to 3 doses.    Marland Kitchen OXAZEPAM 15 MG PO CAPS Oral Take 30 mg by mouth at bedtime.    Marland Kitchen PROMETHAZINE HCL 25 MG PO TABS Oral Take 25 mg by mouth every 6 (six) hours as needed. Nausea/vomiting.    Marland Kitchen  SITAGLIPTIN-METFORMIN HCL 50-1000 MG PO TABS Oral Take 1 tablet by mouth 2 (two) times daily with a meal.    . VALSARTAN 160 MG PO TABS Oral Take 1 tablet (160 mg total) by mouth daily. 30 tablet 0    Hold it for one week, check BMP and if renal funct ...  . VITAMIN C 500 MG PO TABS Oral Take 500 mg by mouth 2 (two) times daily.    Marland Kitchen ZINC SULFATE 220 MG PO CAPS Oral Take 220 mg by mouth daily.      BP 135/66  Pulse 90  Temp 98.1 F (36.7 C) (Oral)  Resp 18  SpO2 99%  Physical Exam  Vitals reviewed. Constitutional: He is oriented to person, place, and time. He appears well-developed and well-nourished.  HENT:  Head: Normocephalic.  Nose: Nose normal.  Mouth/Throat: Oropharynx is clear and moist.       Left ear canal clear, tm dull.  Right tm dull, canal clear  Eyes: Pupils are equal, round, and reactive to light.  Neck: Normal range of motion.  Cardiovascular: Normal rate and normal heart sounds.   Pulmonary/Chest:  Effort normal and breath sounds normal.  Abdominal: Soft. Bowel sounds are normal.  Musculoskeletal: Normal range of motion.  Neurological: He is alert and oriented to person, place, and time. He has normal reflexes.  Skin: Skin is warm and dry.  Psychiatric: He has a normal mood and affect.    ED Course  Procedures (including critical care time)  Labs Reviewed - No data to display No results found.   No diagnosis found.    MDM  Auralgan for pain,   Amoxicillian,  Follow up with primary Md for recheck in 1 week        Lonia Skinner [place], [place] 03/03/12 1850

## 2012-03-04 NOTE — ED Provider Notes (Signed)
DX: Otitis media  Medical screening examination/treatment/procedure(s) were performed by non-physician practitioner and as supervising physician I was immediately available for consultation/collaboration.  Luiz Blare MD   Luiz Blare, MD 03/04/12 1031

## 2012-03-08 ENCOUNTER — Encounter: Payer: Self-pay | Admitting: Internal Medicine

## 2012-03-08 ENCOUNTER — Telehealth: Payer: Self-pay | Admitting: Gastroenterology

## 2012-03-08 NOTE — Telephone Encounter (Signed)
Pt was added to Dr Lauro Franklin scheduled for Monday 03/11/12 130 pm.  Tammy will make pt aware, Kennyth Arnold was notified of the add on to Dr Rhea Belton schedule

## 2012-03-11 ENCOUNTER — Encounter (HOSPITAL_COMMUNITY): Payer: Self-pay

## 2012-03-11 ENCOUNTER — Inpatient Hospital Stay (HOSPITAL_COMMUNITY)
Admission: AD | Admit: 2012-03-11 | Discharge: 2012-03-28 | DRG: 330 | Disposition: A | Discharge status: Skilled Nursing Facility | Payer: Medicare Other | Source: Ambulatory Visit | Attending: Internal Medicine | Admitting: Internal Medicine

## 2012-03-11 ENCOUNTER — Ambulatory Visit (INDEPENDENT_AMBULATORY_CARE_PROVIDER_SITE_OTHER): Payer: Medicare Other | Admitting: Internal Medicine

## 2012-03-11 ENCOUNTER — Encounter: Payer: Self-pay | Admitting: Internal Medicine

## 2012-03-11 ENCOUNTER — Inpatient Hospital Stay (HOSPITAL_COMMUNITY): Payer: Medicare Other

## 2012-03-11 VITALS — BP 100/62 | HR 100 | Temp 99.3°F | Ht 64.0 in | Wt 149.6 lb

## 2012-03-11 DIAGNOSIS — E1165 Type 2 diabetes mellitus with hyperglycemia: Secondary | ICD-10-CM

## 2012-03-11 DIAGNOSIS — I2510 Atherosclerotic heart disease of native coronary artery without angina pectoris: Secondary | ICD-10-CM | POA: Diagnosis present

## 2012-03-11 DIAGNOSIS — J449 Chronic obstructive pulmonary disease, unspecified: Secondary | ICD-10-CM | POA: Diagnosis present

## 2012-03-11 DIAGNOSIS — C182 Malignant neoplasm of ascending colon: Principal | ICD-10-CM | POA: Diagnosis present

## 2012-03-11 DIAGNOSIS — E119 Type 2 diabetes mellitus without complications: Secondary | ICD-10-CM

## 2012-03-11 DIAGNOSIS — D649 Anemia, unspecified: Secondary | ICD-10-CM

## 2012-03-11 DIAGNOSIS — J4489 Other specified chronic obstructive pulmonary disease: Secondary | ICD-10-CM | POA: Diagnosis present

## 2012-03-11 DIAGNOSIS — K5900 Constipation, unspecified: Secondary | ICD-10-CM | POA: Diagnosis present

## 2012-03-11 DIAGNOSIS — R933 Abnormal findings on diagnostic imaging of other parts of digestive tract: Secondary | ICD-10-CM

## 2012-03-11 DIAGNOSIS — I252 Old myocardial infarction: Secondary | ICD-10-CM

## 2012-03-11 DIAGNOSIS — Z8673 Personal history of transient ischemic attack (TIA), and cerebral infarction without residual deficits: Secondary | ICD-10-CM

## 2012-03-11 DIAGNOSIS — R1032 Left lower quadrant pain: Secondary | ICD-10-CM

## 2012-03-11 DIAGNOSIS — R339 Retention of urine, unspecified: Secondary | ICD-10-CM | POA: Diagnosis not present

## 2012-03-11 DIAGNOSIS — R109 Unspecified abdominal pain: Secondary | ICD-10-CM | POA: Diagnosis present

## 2012-03-11 DIAGNOSIS — D62 Acute posthemorrhagic anemia: Secondary | ICD-10-CM | POA: Diagnosis present

## 2012-03-11 DIAGNOSIS — Z8249 Family history of ischemic heart disease and other diseases of the circulatory system: Secondary | ICD-10-CM

## 2012-03-11 DIAGNOSIS — I771 Stricture of artery: Secondary | ICD-10-CM | POA: Diagnosis present

## 2012-03-11 DIAGNOSIS — I10 Essential (primary) hypertension: Secondary | ICD-10-CM | POA: Diagnosis present

## 2012-03-11 DIAGNOSIS — E860 Dehydration: Secondary | ICD-10-CM

## 2012-03-11 DIAGNOSIS — K5732 Diverticulitis of large intestine without perforation or abscess without bleeding: Secondary | ICD-10-CM | POA: Diagnosis present

## 2012-03-11 DIAGNOSIS — E861 Hypovolemia: Secondary | ICD-10-CM

## 2012-03-11 DIAGNOSIS — R195 Other fecal abnormalities: Secondary | ICD-10-CM

## 2012-03-11 DIAGNOSIS — D126 Benign neoplasm of colon, unspecified: Secondary | ICD-10-CM

## 2012-03-11 DIAGNOSIS — I69922 Dysarthria following unspecified cerebrovascular disease: Secondary | ICD-10-CM

## 2012-03-11 DIAGNOSIS — Z87891 Personal history of nicotine dependence: Secondary | ICD-10-CM

## 2012-03-11 DIAGNOSIS — I639 Cerebral infarction, unspecified: Secondary | ICD-10-CM

## 2012-03-11 DIAGNOSIS — Z794 Long term (current) use of insulin: Secondary | ICD-10-CM

## 2012-03-11 DIAGNOSIS — R509 Fever, unspecified: Secondary | ICD-10-CM

## 2012-03-11 DIAGNOSIS — N179 Acute kidney failure, unspecified: Secondary | ICD-10-CM

## 2012-03-11 DIAGNOSIS — E876 Hypokalemia: Secondary | ICD-10-CM | POA: Diagnosis not present

## 2012-03-11 DIAGNOSIS — I959 Hypotension, unspecified: Secondary | ICD-10-CM | POA: Diagnosis not present

## 2012-03-11 DIAGNOSIS — I25810 Atherosclerosis of coronary artery bypass graft(s) without angina pectoris: Secondary | ICD-10-CM | POA: Diagnosis present

## 2012-03-11 DIAGNOSIS — R4701 Aphasia: Secondary | ICD-10-CM

## 2012-03-11 DIAGNOSIS — D490 Neoplasm of unspecified behavior of digestive system: Secondary | ICD-10-CM

## 2012-03-11 DIAGNOSIS — Z7982 Long term (current) use of aspirin: Secondary | ICD-10-CM

## 2012-03-11 DIAGNOSIS — E875 Hyperkalemia: Secondary | ICD-10-CM

## 2012-03-11 DIAGNOSIS — I6529 Occlusion and stenosis of unspecified carotid artery: Secondary | ICD-10-CM

## 2012-03-11 DIAGNOSIS — R112 Nausea with vomiting, unspecified: Secondary | ICD-10-CM

## 2012-03-11 HISTORY — DX: Anemia, unspecified: D64.9

## 2012-03-11 LAB — GLUCOSE, CAPILLARY

## 2012-03-11 LAB — IRON AND TIBC
Iron: 16 ug/dL — ABNORMAL LOW (ref 42–135)
TIBC: 452 ug/dL — ABNORMAL HIGH (ref 215–435)
UIBC: 436 ug/dL — ABNORMAL HIGH (ref 125–400)

## 2012-03-11 MED ORDER — ACETAMINOPHEN 325 MG PO TABS: 650.0000 mg | ORAL_TABLET | Freq: Four times a day (QID) | ORAL | Status: DC | PRN

## 2012-03-11 MED ORDER — ASPIRIN EC 81 MG PO TBEC
81.0000 mg | DELAYED_RELEASE_TABLET | Freq: Every day | ORAL | Status: DC
  Administered 2012-03-12 – 2012-03-20 (×7): 81 mg via ORAL
  Filled 2012-03-11 (×10): qty 1

## 2012-03-11 MED ORDER — EZETIMIBE-SIMVASTATIN 10-40 MG PO TABS
1.0000 | ORAL_TABLET | Freq: Every day | ORAL | Status: DC
  Administered 2012-03-11 – 2012-03-27 (×17): 1 via ORAL
  Filled 2012-03-11 (×18): qty 1

## 2012-03-11 MED ORDER — ACETAMINOPHEN 650 MG RE SUPP: 650.0000 mg | Freq: Four times a day (QID) | RECTAL | Status: DC | PRN

## 2012-03-11 MED ORDER — ZINC SULFATE 220 (50 ZN) MG PO CAPS
220.0000 mg | ORAL_CAPSULE | Freq: Every day | ORAL | Status: DC
  Administered 2012-03-12 – 2012-03-21 (×8): 220 mg via ORAL
  Filled 2012-03-11 (×11): qty 1

## 2012-03-11 MED ORDER — INSULIN ASPART 100 UNIT/ML ~~LOC~~ SOLN: 0.0000 [IU] | Freq: Every day | SUBCUTANEOUS | Status: DC

## 2012-03-11 MED ORDER — CLOPIDOGREL BISULFATE 75 MG PO TABS
75.0000 mg | ORAL_TABLET | Freq: Every day | ORAL | Status: DC
  Administered 2012-03-12: 75 mg via ORAL
  Filled 2012-03-11 (×2): qty 1

## 2012-03-11 MED ORDER — OXYCODONE HCL 5 MG PO TABS
5.0000 mg | ORAL_TABLET | ORAL | Status: DC | PRN
  Administered 2012-03-23: 5 mg via ORAL
  Filled 2012-03-11 (×2): qty 1

## 2012-03-11 MED ORDER — VITAMIN C 500 MG PO TABS
500.0000 mg | ORAL_TABLET | Freq: Two times a day (BID) | ORAL | Status: DC
  Administered 2012-03-11 – 2012-03-21 (×19): 500 mg via ORAL
  Filled 2012-03-11 (×23): qty 1

## 2012-03-11 MED ORDER — IRBESARTAN 150 MG PO TABS
150.0000 mg | ORAL_TABLET | Freq: Every day | ORAL | Status: DC
  Filled 2012-03-11: qty 1

## 2012-03-11 MED ORDER — ONDANSETRON HCL 4 MG/2ML IJ SOLN: 4.0000 mg | Freq: Four times a day (QID) | INTRAMUSCULAR | Status: DC | PRN

## 2012-03-11 MED ORDER — INSULIN ASPART 100 UNIT/ML ~~LOC~~ SOLN
0.0000 [IU] | Freq: Three times a day (TID) | SUBCUTANEOUS | Status: DC
  Administered 2012-03-12: 2 [IU] via SUBCUTANEOUS
  Administered 2012-03-12: 3 [IU] via SUBCUTANEOUS
  Administered 2012-03-12: 2 [IU] via SUBCUTANEOUS
  Administered 2012-03-13: 1 [IU] via SUBCUTANEOUS
  Administered 2012-03-14: 2 [IU] via SUBCUTANEOUS
  Administered 2012-03-15: 1 [IU] via SUBCUTANEOUS
  Administered 2012-03-15 – 2012-03-16 (×5): 2 [IU] via SUBCUTANEOUS
  Administered 2012-03-17 (×2): 1 [IU] via SUBCUTANEOUS
  Administered 2012-03-18: 3 [IU] via SUBCUTANEOUS
  Administered 2012-03-18: 1 [IU] via SUBCUTANEOUS
  Administered 2012-03-18: 3 [IU] via SUBCUTANEOUS
  Administered 2012-03-19 – 2012-03-20 (×3): 1 [IU] via SUBCUTANEOUS
  Administered 2012-03-20: 2 [IU] via SUBCUTANEOUS
  Administered 2012-03-21: 1 [IU] via SUBCUTANEOUS
  Administered 2012-03-21 (×2): 2 [IU] via SUBCUTANEOUS
  Administered 2012-03-22: 3 [IU] via SUBCUTANEOUS
  Administered 2012-03-22: 1 [IU] via SUBCUTANEOUS

## 2012-03-11 MED ORDER — NITROGLYCERIN 0.4 MG/SPRAY TL SOLN: 1.0000 | Status: DC | PRN

## 2012-03-11 MED ORDER — IPRATROPIUM BROMIDE 0.02 % IN SOLN: 0.5000 mg | RESPIRATORY_TRACT | Status: DC | PRN

## 2012-03-11 MED ORDER — ADULT MULTIVITAMIN W/MINERALS CH
1.0000 | ORAL_TABLET | Freq: Every day | ORAL | Status: DC
  Administered 2012-03-12 – 2012-03-21 (×8): 1 via ORAL
  Filled 2012-03-11 (×11): qty 1

## 2012-03-11 MED ORDER — ONDANSETRON HCL 4 MG PO TABS: 4.0000 mg | ORAL_TABLET | Freq: Four times a day (QID) | ORAL | Status: DC | PRN

## 2012-03-11 MED ORDER — CIPROFLOXACIN IN D5W 400 MG/200ML IV SOLN
400.0000 mg | Freq: Two times a day (BID) | INTRAVENOUS | Status: DC
  Administered 2012-03-11 – 2012-03-20 (×17): 400 mg via INTRAVENOUS
  Filled 2012-03-11 (×20): qty 200

## 2012-03-11 MED ORDER — METRONIDAZOLE IN NACL 5-0.79 MG/ML-% IV SOLN
500.0000 mg | Freq: Three times a day (TID) | INTRAVENOUS | Status: DC
  Administered 2012-03-11 – 2012-03-20 (×26): 500 mg via INTRAVENOUS
  Filled 2012-03-11 (×31): qty 100

## 2012-03-11 MED ORDER — ALBUTEROL SULFATE (5 MG/ML) 0.5% IN NEBU: 2.5000 mg | INHALATION_SOLUTION | RESPIRATORY_TRACT | Status: DC | PRN

## 2012-03-11 MED ORDER — HYDROMORPHONE HCL PF 1 MG/ML IJ SOLN
0.5000 mg | INTRAMUSCULAR | Status: DC | PRN
  Administered 2012-03-11 – 2012-03-15 (×5): 0.5 mg via INTRAVENOUS
  Filled 2012-03-11 (×5): qty 1

## 2012-03-11 MED ORDER — SODIUM CHLORIDE 0.9 % IV SOLN
INTRAVENOUS | Status: DC
  Administered 2012-03-11 – 2012-03-13 (×2): via INTRAVENOUS
  Administered 2012-03-13: 500 mL via INTRAVENOUS
  Administered 2012-03-16 – 2012-03-18 (×3): via INTRAVENOUS

## 2012-03-11 MED ORDER — SODIUM CHLORIDE 0.9 % IJ SOLN
3.0000 mL | Freq: Two times a day (BID) | INTRAMUSCULAR | Status: DC
  Administered 2012-03-11 – 2012-03-21 (×9): 3 mL via INTRAVENOUS

## 2012-03-11 MED ORDER — OXAZEPAM 15 MG PO CAPS
30.0000 mg | ORAL_CAPSULE | Freq: Every day | ORAL | Status: DC
Start: 2012-03-12 — End: 2012-03-12

## 2012-03-11 MED ORDER — IOHEXOL 300 MG/ML  SOLN
80.0000 mL | Freq: Once | INTRAMUSCULAR | Status: AC | PRN
  Administered 2012-03-11: 80 mL via INTRAVENOUS

## 2012-03-11 NOTE — Plan of Care (Signed)
Problem: Phase I Progression Outcomes Goal: Voiding-avoid urinary catheter unless indicated Outcome: Not Progressing Incontinent, wearing depends

## 2012-03-11 NOTE — Progress Notes (Signed)
Patient arrived to hospital alert and oriented, 0/10 pain, last bowel movement 03/11/12, vital signs charted in flow sheet, afebrile.  Daughter and spouse in the room with the patient, safety video viewed, admission nurse notified, pharmacy notified, flow manager notified of patient's arrival.

## 2012-03-11 NOTE — Progress Notes (Signed)
Patient ID: [name], male   DOB: 01-Nov-1940, 71 y.o.   MRN: [id number]  SUBJECTIVE: HPI [name] is a 71 yo male with PMH of stroke in 2013, MI in 2013, insulin-dependent diabetes, hypertension, COPD who is seen in consultation at the request of Dr. Doristine Counter [facility]) for evaluation of anemia and left lower quadrant abdominal pain. The patient is accompanied today by his daughter. The patient has some degree of dysarthria after his stroke earlier in 2013 and the majority of the history of present illness is provided by his daughter. They report approximately 5-7 days of left lower quadrant abdominal pain. This pain is constant but worse at times. He's also been having constipation over the last week. He has tried laxatives including MiraLAX without much benefit. His last bowel movement was today. They report this was hard but brown in color. No bright red blood per rectum, hematochezia or melena. The left lower quadrant pain is sharp but nonradiating. He rates the pain 8-9/10. He does report low-grade fever of the last several days. His temperature was 100F at home this morning. The daughter notes some low blood pressure at home as well, but has a difficult time giving me a number. He has been able to 8 without nausea or vomiting. No hematemesis.  No overt bleeding in his urine. No easy bruising.  Review of Systems  As per history of present illness, otherwise negative   Past Medical History  Diagnosis Date  . Myocardial infarction   . CVA (cerebral infarction)   . Diabetes mellitus   . Hypertension   . Diabetes mellitus   . Aphasia   . Dysphagia   . Stroke   . COPD (chronic obstructive pulmonary disease)   . Shortness of breath     Current Outpatient Prescriptions  Medication Sig Dispense Refill  . amoxicillin (AMOXIL) 500 MG capsule Take 1 capsule (500 mg total) by mouth 3 (three) times daily.  30 capsule  0  . aspirin EC 81 MG tablet Take  81 mg by mouth daily. For CVA prophylaxis.      Marland Kitchen benzonatate (TESSALON) 100 MG capsule Take 100 mg by mouth 3 (three) times daily. For cough.      . clopidogrel (PLAVIX) 75 MG tablet Take 75 mg by mouth daily with breakfast.      . ezetimibe-simvastatin (VYTORIN) 10-40 MG per tablet Take 1 tablet by mouth at bedtime.      . food thickener (THICK IT) POWD Take 1 g by mouth as needed. As directed for dysphagia. (Honey thick liquids.)      . insulin aspart (NOVOLOG) 100 UNIT/ML injection Inject 0-10 Units into the skin 3 (three) times daily with meals. As per scale  CBG 61-150        0 UNIT CBG 151-200        2 UNIT CBG 201-250     4 UNIT CBG 251-300      6 UNIT CBG 301-350    8 UNIT CBG 351-400     10 UNIT  CBG>400 ,calll your doctor      . insulin glargine (LANTUS) 100 UNIT/ML injection Inject 10 Units into the skin at bedtime.      . Multiple Vitamin (MULITIVITAMIN WITH MINERALS) TABS Take 1 tablet by mouth daily.      . nitroGLYCERIN (NITROLINGUAL) 0.4 MG/SPRAY spray Place 1 spray under the tongue every 5 (five) minutes as needed. Chest pain. Every 5 minutes up to 3  doses.      . oxazepam (SERAX) 15 MG capsule Take 30 mg by mouth at bedtime.      . promethazine (PHENERGAN) 25 MG tablet Take 25 mg by mouth every 6 (six) hours as needed. Nausea/vomiting.      . sitaGLIPtan-metformin (JANUMET) 50-1000 MG per tablet Take 1 tablet by mouth 2 (two) times daily with a meal.      . valsartan (DIOVAN) 160 MG tablet Take 1 tablet (160 mg total) by mouth daily.  30 tablet  0  . vitamin C (ASCORBIC ACID) 500 MG tablet Take 500 mg by mouth 2 (two) times daily.      Marland Kitchen zinc sulfate 220 MG capsule Take 220 mg by mouth daily.        No Known Allergies  Family History  Problem Relation Age of Onset  . Breast cancer Mother   . Heart disease Brother   . Colon cancer Maternal Uncle     History  Substance Use Topics  . Smoking status: Former Smoker -- 16 years    Types: Cigarettes    Quit date:  12/27/2011  . Smokeless tobacco: Never Used  . Alcohol Use: No    OBJECTIVE: BP 100/62  Pulse 100  Temp 99.3 F (37.4 C)  Ht 5\' 4"  (1.626 m)  Wt 149 lb 9.6 oz (67.858 kg)  BMI 25.68 kg/m2 Constitutional: White male in no acute distress HEENT: Normocephalic and atraumatic. Oropharynx is clear and moist. No oropharyngeal exudate. Conjunctivae are normal. No scleral icterus. Neck: Neck supple. Trachea midline. Cardiovascular: Tachy, regular rhythm and intact distal pulses. Pulmonary/chest: Effort normal and breath sounds normal. No wheezing, rales or rhonchi. Abdominal: Soft, tender to palpation left lower corner and with mild voluntary guarding, no rebound, nondistended. Bowel sounds active throughout. There are no masses palpable.  Extremities: no clubbing, cyanosis, or edema Lymphadenopathy: No cervical adenopathy noted Skin: Skin is warm and dry, somewhat pale. No rashes noted. Psychiatric: Behavior is normal, but his emotions are labile and he cries briefly for negative responses or pain1  Labs and Imaging -- CBC    Component Value Date/Time   WBC 6.9 01/31/2012 0510   RBC 3.52* 01/31/2012 0510   HGB 9.4* 01/31/2012 0510   HCT 28.9* 01/31/2012 0510   PLT 154 01/31/2012 0510   MCV 82.1 01/31/2012 0510   MCH 26.7 01/31/2012 0510   MCHC 32.5 01/31/2012 0510   RDW 13.6 01/31/2012 0510   CMP     Component Value Date/Time   NA 135 02/01/2012 1211   K 4.4 02/01/2012 1211   CL 104 02/01/2012 1211   CO2 24 02/01/2012 1211   GLUCOSE 178* 02/01/2012 1211   BUN 28* 02/01/2012 1211   CREATININE 1.11 02/01/2012 1211   CALCIUM 8.5 02/01/2012 1211   PROT 5.7* 01/30/2012 0500   ALBUMIN 2.9* 01/30/2012 0500   AST 11 01/30/2012 0500   ALT 10 01/30/2012 0500   ALKPHOS 64 01/30/2012 0500   BILITOT 0.1* 01/30/2012 0500   GFRNONAA 65* 02/01/2012 1211   GFRAA 76* 02/01/2012 1211   Labs from PCP dated 03/08/12 WBC 7.3, hemoglobin 8.1, hematocrit 26.4, platelet 295 Sodium 139, potassium 4.8, chloride 102,  CO2 31, BUN 18, creatinine 1.1, glucose 221, calcium 9.5  Abdomen 2 view, date 03/07/2012 Person 1. A 10 mm calcification projects over the right kidney, suggesting a pelvic kidney stone. 2. Moderate stool in the ascending colon. There is no evidence for obstruction. 3. Pelvic phleboliths  ASSESSMENT AND PLAN: 71  yo male with PMH of stroke in 2013, MI in 2013, insulin-dependent diabetes, hypertension, COPD who is seen in consultation at the request of Dr. Doristine Counter [facility] at [facility]) for evaluation of anemia and left lower quadrant abdominal pain.  1. LLQ pain, low-grade fever, tachycardia -- the patient's constellation of symptoms is concerning for diverticulitis. He has been on amoxicillin for an ear infection diagnosed on 03/03/2012. I recommended CT scan of the abdomen and pelvis, and given his chronic comorbidities and constellation of symptoms today including new and worsening anemia, I recommended admission. I discussed this with Dr. Venetia Constable with the hospitalist team, and they have agreed to admit. We appreciate their assistance in this matter. The GI consult service will follow the patient during the hospitalization. Empiric antibiotics are reasonable, but hopefully the CT scan can be performed today.  2. Anemia -- the patient's anemia has worsened over the last month. I recommend obtaining a Hemoccult with his next bowel movement. We also should perform iron studies to better evaluate his anemia. He has never had endoscopy or colonoscopy, but at this point there is no overt bleeding, and therefore no indication for urgent endoscopy. We will know more about his anemia after the above studies are performed.  The patient will be transported to [facility] for admission by his daughter.

## 2012-03-11 NOTE — H&P (Signed)
PCP:   Delorse Lek, MD   Chief Complaint:  Abdominal pain and fever for 5-7 days.   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 He was incidentally, noted to have an anemia of 9.4, MCV 82.1.    Allergies:  No Known Allergies    Past Medical History  Diagnosis Date  . Myocardial infarction   . CVA (cerebral infarction)   . Diabetes mellitus   . Hypertension   . Diabetes mellitus   . Aphasia   . Dysphagia   . Stroke   . COPD (chronic obstructive pulmonary disease)   . Shortness of breath     Past Surgical History  Procedure Date  . Coronary artery bypass graft   . Appendectomy   . Dental surgery     Prior to Admission medications   Medication Sig Start Date End Date Taking? Authorizing Provider  amoxicillin (AMOXIL) 500 MG capsule Take 1 capsule (500 mg total) by mouth 3 (three) times daily. 03/03/12 03/13/12  Elson Areas, PA  aspirin EC 81 MG tablet Take 81 mg by mouth daily. For CVA prophylaxis.    Historical Provider, MD  benzonatate (TESSALON) 100 MG capsule Take 100 mg by mouth 3 (three) times daily. For cough.    Historical Provider, MD  clopidogrel (PLAVIX) 75 MG tablet Take 75 mg by mouth daily with breakfast. 01/03/12 01/02/13  Antonieta Pert, MD  ezetimibe-simvastatin (VYTORIN) 10-40 MG per tablet Take 1 tablet by mouth at bedtime.    Historical Provider, MD  food thickener (THICK IT) POWD Take 1 g by mouth as needed. As directed for dysphagia. (Honey thick liquids.)    Historical Provider, MD  insulin aspart (NOVOLOG) 100 UNIT/ML injection Inject  0-10 Units into the skin 3 (three) times daily with meals. As per scale  CBG 61-150        0 UNIT CBG 151-200        2 UNIT CBG 201-250     4 UNIT CBG 251-300      6 UNIT CBG 301-350    8 UNIT CBG 351-400     10 UNIT  CBG>400 ,calll your doctor 01/03/12 01/02/13  Antonieta Pert, MD  insulin glargine (LANTUS) 100 UNIT/ML injection Inject 10 Units into the skin at bedtime. 01/02/12 01/01/13  Simbiso Ranga, MD  Multiple Vitamin (MULITIVITAMIN WITH MINERALS) TABS Take 1 tablet by mouth daily.    Historical Provider, MD  nitroGLYCERIN (NITROLINGUAL) 0.4 MG/SPRAY spray Place 1 spray under the tongue every 5 (five) minutes as needed. Chest pain. Every 5 minutes up to 3 doses.    Historical Provider, MD  oxazepam (SERAX) 15 MG capsule Take 30 mg by mouth at bedtime.    Historical Provider, MD  promethazine (PHENERGAN) 25 MG tablet Take 25 mg by mouth every 6 (six) hours as needed. Nausea/vomiting.    Historical Provider, MD  sitaGLIPtan-metformin (JANUMET) 50-1000 MG per tablet Take 1 tablet by mouth 2 (two) times daily with a meal.    Historical Provider, MD  valsartan (DIOVAN) 160 MG tablet Take 1 tablet (160 mg total) by  mouth daily. 02/01/12   Kathlen Mody, MD  vitamin C (ASCORBIC ACID) 500 MG tablet Take 500 mg by mouth 2 (two) times daily.    Historical Provider, MD  zinc sulfate 220 MG capsule Take 220 mg by mouth daily.    Historical Provider, MD    Social History:  reports that he quit smoking about 70 months ago. His smoking use included Cigarettes. He quit after 16 years of use. He has never used smokeless tobacco. He reports that he does not drink alcohol or use illicit drugs.  Family History  Problem Relation Age of Onset  . Breast cancer Mother   . Heart disease Brother   . Colon cancer Maternal Uncle     Review of Systems:  As per HPI and chief complaint. Patent systems review was a bit difficult to obtain, secondary to dysarthria and expressive dysphasia, but patient denies denies,  weight loss, headache, chest pain, cough, shortness of breath, orthopnea, paroxysmal nocturnal dyspnea, vomiting, diarrhea, hematemesis, melena, hematochezia, lower extremity swelling, pain, or redness. The rest of the systems review is negative.  Physical Exam:  General:  Patient does not appear to be in obvious acute distress. Alert, communicative, albeit with dysarthria and expressive dysphasia, not short of breath at rest.  HEENT:  Mild clinical pallor, no jaundice, no conjunctival injection or discharge. Hydration status appears fair.  NECK:  Supple, JVP not seen, no carotid bruits, no palpable lymphadenopathy, no palpable goiter. CHEST:  Clinically clear to auscultation, no wheezes, no crackles. Mid-line sternotomy scar is noted.  HEART:  Sounds 1 and 2 heard, normal, regular, no murmurs. ABDOMEN:  Full, soft, mildly tender in epigastrium and mid abdomen, but markedly tender in LLQ. no palpable organomegaly, no palpable masses. Bowel sounds are heard. GENITALIA:  Not examined. LOWER EXTREMITIES:  No pitting edema, palpable peripheral pulses. MUSCULOSKELETAL SYSTEM:  Generalized osteoarthritic changes, otherwise, normal. CENTRAL NERVOUS SYSTEM:  Dysarthria, expressive dysphasia, mild RUE weakness. .  Labs on Admission:  No results found for this or any previous visit (from the past 48 hour(s)).  Radiological Exams on Admission: No results found.  Assessment/Plan Principal Problem:  *Abdominal pain: Patient presents with approximately one week of altered bowel habit/constipation, left lower quadrant abdominal pain, and a low grade pyrexia. I agree that clinically, this constellation of clinical features, is highly suspicious for acute diverticulitis. Reportedly, patient has recently been treated with oral Amoxicillin, and this may have somewhat muddied the picture. We shall place patient on clear fluids only, for now, start maintenance iv fluids, and empiric Ciprofloxacin/Flagyl. Blood  cultures will be sent off, urinalysis done, and abdominal/pelvic CT scan with contrast, obtained.  Active Problems:  1. Dehydration: Patient has elevated BUN/Creatinine ratio, consistent with dehydration. As described above, he will be placed on iv fluids.   2. Anemia: Patient has a mild normocytic anemia. Etiology is unclear at this time, and we shal  Defer to Dr Rhea Belton, re definitive evaluation. In the meantime, will arrange appropriate hematinic studies, and do stool guaiac testing.   3. CAD (coronary artery disease): Patient has a known history of CAD, s/p MI,s/p CABG. He appears stable and asymptomatic from this viewpoint. We shall monitor telemetrically.   4. HTN (hypertension): BP appears reasonably controlled at this time.  We shall manage with pre-admission antihypertensives.   5. COPD (chronic obstructive pulmonary disease): Stable/aymptomatic.   6. H/O: CVA (cerebrovascular accident): Stable.   7. Diabetes mellitus: This is insulin-requiring type 2, and appears sub-optimally controlled, based on random  glucose. We shall manage with SSI, and add scheduled Lantus, if indicated.   Further management will depend on clinical course.   Time Spent on Admission: 45 mins.   Jovani Colquhoun,CHRISTOPHER 03/11/2012, 4:24 PM

## 2012-03-11 NOTE — Consult Note (Signed)
[name]  Referring Provider: [facility] Primary Care Physician:  Delorse Lek, MD Primary Gastroenterologist:   Erick Blinks, MD Reason for [name]:  abdominal pain Patient seen in office today by Dr. Rhea Belton. He was subsequently admitted by Hospitalist. Dr. Lauro Franklin note from 03/11/12 has been cut and pasted below.   HPI: [name] is a 71 yo male with PMH of stroke in 2013, MI in 2013, insulin-dependent diabetes, hypertension, COPD who is seen in [name] at the request of Dr. Doristine Counter [facility]) for evaluation of anemia and left lower quadrant abdominal pain. The patient is accompanied today by his daughter. The patient has some degree of dysarthria after his stroke earlier in 2013 and the majority of the history of present illness is provided by his daughter. They report approximately 5-7 days of left lower quadrant abdominal pain. This pain is constant but worse at times. He's also been having constipation over the last week. He has tried laxatives including MiraLAX without much benefit. His last bowel movement was today. They report this was hard but brown in color. No bright red blood per rectum, hematochezia or melena. The left lower quadrant pain is sharp but nonradiating. He rates the pain 8-9/10. He does report low-grade fever of the last several days. His temperature was 100F at home this morning. The daughter notes some low blood pressure at home as well, but has a difficult time giving me a number. He has been able to 8 without nausea or vomiting. No hematemesis. No overt bleeding in his urine. No easy bruising.   Review of Systems  As per history of present illness, otherwise negative   Past Medical History   Diagnosis  Date   .  Myocardial infarction    .  CVA (cerebral infarction)    .  Diabetes mellitus    .  Hypertension    .  Diabetes mellitus    .  Aphasia    .  Dysphagia    .  Stroke    .   COPD (chronic obstructive pulmonary disease)    .  Shortness of breath     Current Outpatient Prescriptions   Medication  Sig  Dispense  Refill   .  amoxicillin (AMOXIL) 500 MG capsule  Take 1 capsule (500 mg total) by mouth 3 (three) times daily.  30 capsule  0   .  aspirin EC 81 MG tablet  Take 81 mg by mouth daily. For CVA prophylaxis.     Marland Kitchen  benzonatate (TESSALON) 100 MG capsule  Take 100 mg by mouth 3 (three) times daily. For cough.     .  clopidogrel (PLAVIX) 75 MG tablet  Take 75 mg by mouth daily with breakfast.     .  ezetimibe-simvastatin (VYTORIN) 10-40 MG per tablet  Take 1 tablet by mouth at bedtime.     .  food thickener (THICK IT) POWD  Take 1 g by mouth as needed. As directed for dysphagia. (Honey thick liquids.)     .  insulin aspart (NOVOLOG) 100 UNIT/ML injection  Inject 0-10 Units into the skin 3 (three) times daily with meals. As per scale  CBG 61-150 0 UNIT  CBG 151-200 2 UNIT  CBG 201-250 4 UNIT  CBG 251-300 6 UNIT  CBG 301-350 8 UNIT  CBG 351-400 10 UNIT  CBG>400 ,calll your doctor     .  insulin glargine (LANTUS) 100 UNIT/ML injection  Inject 10 Units into the skin at  bedtime.     .  Multiple Vitamin (MULITIVITAMIN WITH MINERALS) TABS  Take 1 tablet by mouth daily.     .  nitroGLYCERIN (NITROLINGUAL) 0.4 MG/SPRAY spray  Place 1 spray under the tongue every 5 (five) minutes as needed. Chest pain. Every 5 minutes up to 3 doses.     Marland Kitchen  oxazepam (SERAX) 15 MG capsule  Take 30 mg by mouth at bedtime.     .  promethazine (PHENERGAN) 25 MG tablet  Take 25 mg by mouth every 6 (six) hours as needed. Nausea/vomiting.     .  sitaGLIPtan-metformin (JANUMET) 50-1000 MG per tablet  Take 1 tablet by mouth 2 (two) times daily with a meal.     .  valsartan (DIOVAN) 160 MG tablet  Take 1 tablet (160 mg total) by mouth daily.  30 tablet  0   .  vitamin C (ASCORBIC ACID) 500 MG tablet  Take 500 mg by mouth 2 (two) times daily.     Marland Kitchen  zinc sulfate 220 MG capsule  Take 220 mg by mouth  daily.      No Known Allergies  Family History   Problem  Relation  Age of Onset   .  Breast cancer  Mother    .  Heart disease  Brother    .  Colon cancer  Maternal Uncle     History   Substance Use Topics   .  Smoking status:  Former Smoker -- 16 years     Types:  Cigarettes     Quit date:  12/27/2011   .  Smokeless tobacco:  Never Used   .  Alcohol Use:  No    OBJECTIVE:  BP 100/62  Pulse 100  Temp 99.3 F (37.4 C)  Ht 5\' 4"  (1.626 m)  Wt 149 lb 9.6 oz (67.858 kg)  BMI 25.68 kg/m2  Constitutional: White male in no acute distress  HEENT: Normocephalic and atraumatic. Oropharynx is clear and moist. No oropharyngeal exudate. Conjunctivae are normal. No scleral icterus.  Neck: Neck supple. Trachea midline.  Cardiovascular: Tachy, regular rhythm and intact distal pulses.  Pulmonary/chest: Effort normal and breath sounds normal. No wheezing, rales or rhonchi.  Abdominal: Soft, tender to palpation left lower corner and with mild voluntary guarding, no rebound, nondistended. Bowel sounds active throughout. There are no masses palpable.  Extremities: no clubbing, cyanosis, or edema  Lymphadenopathy: No cervical adenopathy noted  Skin: Skin is warm and dry, somewhat pale. No rashes noted.  Psychiatric: Behavior is normal, but his emotions are labile and he cries briefly for negative responses or pain1  Labs and Imaging --  CBC    Component  Value  Date/Time    WBC  6.9  01/31/2012 0510    RBC  3.52*  01/31/2012 0510    HGB  9.4*  01/31/2012 0510    HCT  28.9*  01/31/2012 0510    PLT  154  01/31/2012 0510    MCV  82.1  01/31/2012 0510    MCH  26.7  01/31/2012 0510    MCHC  32.5  01/31/2012 0510    RDW  13.6  01/31/2012 0510    CMP    Component  Value  Date/Time    NA  135  02/01/2012 1211    K  4.4  02/01/2012 1211    CL  104  02/01/2012 1211    CO2  24  02/01/2012 1211    GLUCOSE  178*  02/01/2012 1211  BUN  28*  02/01/2012 1211    CREATININE  1.11  02/01/2012 1211    CALCIUM   8.5  02/01/2012 1211    PROT  5.7*  01/30/2012 0500    ALBUMIN  2.9*  01/30/2012 0500    AST  11  01/30/2012 0500    ALT  10  01/30/2012 0500    ALKPHOS  64  01/30/2012 0500    BILITOT  0.1*  01/30/2012 0500    GFRNONAA  65*  02/01/2012 1211    GFRAA  76*  02/01/2012 1211    Labs from PCP dated 03/08/12  WBC 7.3, hemoglobin 8.1, hematocrit 26.4, platelet 295  Sodium 139, potassium 4.8, chloride 102, CO2 31, BUN 18, creatinine 1.1, glucose 221, calcium 9.5   Abdomen 2 view, date 03/07/2012  Person 1. A 10 mm calcification projects over the right kidney, suggesting a pelvic kidney stone. 2. Moderate stool in the ascending colon. There is no evidence for obstruction. 3. Pelvic phleboliths   ASSESSMENT AND PLAN:  71 yo male with PMH of stroke in 2013, MI in 2013, insulin-dependent diabetes, hypertension, COPD who is seen in [name] at the request of Dr. Doristine Counter [facility]) for evaluation of anemia and left lower quadrant abdominal pain.   1. LLQ pain, low-grade fever, tachycardia -- the patient's constellation of symptoms is concerning for diverticulitis. He has been on amoxicillin for an ear infection diagnosed on 03/03/2012. I recommended CT scan of the abdomen and pelvis, and given his chronic comorbidities and constellation of symptoms today including new and worsening anemia, I recommended admission. I discussed this with Dr. Venetia Constable with the hospitalist team, and they have agreed to admit. We appreciate their assistance in this matter. The GI consult service will follow the patient during the hospitalization. Empiric antibiotics are reasonable, but hopefully the CT scan can be performed today.   2. Anemia -- the patient's anemia has worsened over the last month. I recommend obtaining a Hemoccult with his next bowel movement. We also should perform iron studies to better evaluate his anemia. He has never had endoscopy or colonoscopy, but at this point there is no  overt bleeding, and therefore no indication for urgent endoscopy. We will know more about his anemia after the above studies are performed. The patient will be transported to [facility] for admission by his daughter.   Erick Blinks, MD

## 2012-03-12 DIAGNOSIS — R112 Nausea with vomiting, unspecified: Secondary | ICD-10-CM

## 2012-03-12 DIAGNOSIS — R1032 Left lower quadrant pain: Secondary | ICD-10-CM

## 2012-03-12 DIAGNOSIS — R109 Unspecified abdominal pain: Secondary | ICD-10-CM

## 2012-03-12 DIAGNOSIS — E860 Dehydration: Secondary | ICD-10-CM

## 2012-03-12 DIAGNOSIS — E1165 Type 2 diabetes mellitus with hyperglycemia: Secondary | ICD-10-CM

## 2012-03-12 DIAGNOSIS — R195 Other fecal abnormalities: Secondary | ICD-10-CM

## 2012-03-12 DIAGNOSIS — D649 Anemia, unspecified: Secondary | ICD-10-CM

## 2012-03-12 DIAGNOSIS — R933 Abnormal findings on diagnostic imaging of other parts of digestive tract: Secondary | ICD-10-CM

## 2012-03-12 LAB — COMPREHENSIVE METABOLIC PANEL
ALT: 10 U/L (ref 0–53)
Alkaline Phosphatase: 65 U/L (ref 39–117)
BUN: 16 mg/dL (ref 6–23)
CO2: 24 mEq/L (ref 19–32)
GFR calc Af Amer: >90 mL/min (ref >90)
GFR calc non Af Amer: 85 mL/min — ABNORMAL LOW (ref >90)
Glucose, Bld: 215 mg/dL — ABNORMAL HIGH (ref 70–99)
Potassium: 4.5 mEq/L (ref 3.5–5.1)
Sodium: 136 mEq/L (ref 135–145)

## 2012-03-12 LAB — GLUCOSE, CAPILLARY
Glucose-Capillary: 175 mg/dL — ABNORMAL HIGH (ref 70–99)
Glucose-Capillary: 164 mg/dL — ABNORMAL HIGH (ref 70–99)
Glucose-Capillary: 183 mg/dL — ABNORMAL HIGH (ref 70–99)

## 2012-03-12 LAB — HEMOGLOBIN A1C
Hgb A1c MFr Bld: 7.2 % — ABNORMAL HIGH (ref <5.7)
Mean Plasma Glucose: 160 mg/dL — ABNORMAL HIGH (ref <117)

## 2012-03-12 LAB — CBC
MCHC: 31.6 g/dL (ref 30.0–36.0)
Platelets: 257 10*3/uL (ref 150–400)
RDW: 14.1 % (ref 11.5–15.5)

## 2012-03-12 LAB — URINE MICROSCOPIC-ADD ON: Urine-Other: NONE SEEN

## 2012-03-12 LAB — URINALYSIS, ROUTINE W REFLEX MICROSCOPIC
Glucose, UA: 100 mg/dL — AB
Ketones, ur: NEGATIVE mg/dL
Leukocytes, UA: NEGATIVE
Protein, ur: 30 mg/dL — AB

## 2012-03-12 LAB — PREPARE RBC (CROSSMATCH)

## 2012-03-12 LAB — CEA: CEA: 3.2 ng/mL (ref 0.0–5.0)

## 2012-03-12 MED ORDER — BOOST / RESOURCE BREEZE PO LIQD
1.0000 | Freq: Two times a day (BID) | ORAL | Status: DC
  Administered 2012-03-12 – 2012-03-28 (×20): 1 via ORAL
  Filled 2012-03-12 (×14): qty 1

## 2012-03-12 MED ORDER — LORAZEPAM 1 MG PO TABS
2.0000 mg | ORAL_TABLET | Freq: Every day | ORAL | Status: DC
  Administered 2012-03-12 – 2012-03-27 (×16): 2 mg via ORAL
  Filled 2012-03-12 (×4): qty 2
  Filled 2012-03-12: qty 4
  Filled 2012-03-12 (×2): qty 2
  Filled 2012-03-12: qty 1
  Filled 2012-03-12: qty 4
  Filled 2012-03-12 (×2): qty 2
  Filled 2012-03-12: qty 4
  Filled 2012-03-12: qty 1
  Filled 2012-03-12 (×3): qty 2

## 2012-03-12 MED ORDER — PEG-KCL-NACL-NASULF-NA ASC-C 100 G PO SOLR
1.0000 | Freq: Once | ORAL | Status: AC
  Administered 2012-03-12: 100 g via ORAL
  Filled 2012-03-12: qty 1

## 2012-03-12 MED ORDER — SODIUM CHLORIDE 0.9 % IV BOLUS (SEPSIS)
500.0000 mL | INTRAVENOUS | Status: AC
  Administered 2012-03-12: 500 mL via INTRAVENOUS

## 2012-03-12 NOTE — Progress Notes (Signed)
Subjective No complaint  Objective: Hgb 7.1 this am, CT scan of the abdomen shows a mass along the right colon distal to the ileocecal valve Vital signs in last 24 hours: Temp:  [98.3 F (36.8 C)-99.3 F (37.4 C)] 98.3 F (36.8 C) (06/25 0557) Pulse Rate:  [76-100] 76  (06/25 0557) Resp:  [18-20] 18  (06/25 0557) BP: (81-144)/(50-72) 81/50 mmHg (06/25 0557) SpO2:  [99 %] 99 % (06/25 0557) Weight:  [149 lb 3.2 oz (67.677 kg)-149 lb 9.6 oz (67.858 kg)] 149 lb 3.2 oz (67.677 kg) (06/24 1617) Last BM Date: 03/11/12 General:   Alert,  pleasant, cooperative in NAD. Eyes:  Sclera clear, no icterus.   Conjunctiva pink. Neck:  Supple; no masses or thyromegaly. Heart:  Regular rate and rhythm; no murmurs, clicks, rubs,  or gallops. Lungs:  No wheezes or rales Abdomen:  Soft, positive bowl sounds, mild tenderness all quadrants, no palpable mass  Msk:  Symmetrical without gross deformities. Normal posture. Pulses:  Normal pulses noted. Extremities:  Without clubbing or edema. Neurologic:  Alert and  oriented x4;  grossly normal neurologically.understands questions, answers briefly Skin:  Intact without significant lesions or rashes. Rectal exam : brown heme positive stool Intake/Output from previous day: 06/24 0701 - 06/25 0700 In: 1547.5 [P.O.:240; I.V.:907.5; IV Piggyback:400] Out: 900 [Urine:900] Intake/Output this shift:    Lab Results:  Basename 03/12/12 0436  WBC 8.9  HGB 7.1*  HCT 22.5*  PLT 257   BMET  Basename 03/12/12 0436 03/11/12 2037  NA 136 --  K 4.5 --  CL 103 --  CO2 24 --  GLUCOSE 215* --  BUN 16 17  CREATININE 0.88 0.93  CALCIUM 8.4 --   LFT  Basename 03/12/12 0436  PROT 5.9*  ALBUMIN 3.0*  AST 20  ALT 10  ALKPHOS 65  BILITOT 0.2*  BILIDIR --  IBILI --   PT/INR No results found for this basename: LABPROT:2,INR:2 in the last 72 hours Hepatitis Panel No results found for this basename: HEPBSAG,HCVAB,HEPAIGM,HEPBIGM in the last 72  hours  Studies/Results: Ct Abdomen Pelvis W Contrast  03/11/2012  *RADIOLOGY REPORT*  Clinical Data: 71 year old male with left lower quadrant abdominal pain.  History of kidney stones.  CT ABDOMEN AND PELVIS WITH CONTRAST  Technique:  Multidetector CT imaging of the abdomen and pelvis was performed following the standard protocol during bolus administration of intravenous contrast.  Contrast: 80mL OMNIPAQUE IOHEXOL 300 MG/ML  SOLN  Comparison: Abdominal radiographs 03/07/2012.  Findings: Dependent opacity and crowding of markings at the lung bases compatible with atelectasis.  Mild cardiomegaly.  No pericardial or pleural effusion.  Sequelae median sternotomy. Degenerative changes in the spine. No acute osseous abnormality identified.  No pelvic free fluid.  The bladder is distended and yet there is evidence of bladder wall thickening.  No perivesical stranding. The prostate is enlarged and lobulated and there is mild contour deformity at the base of the bladder.  Negative distal colon aside from retained stool.  Oral contrast has reached the splenic flexure.  No wall thickening or inflammation of the left colon.  Contrast mixed with stool throughout the more proximal colon.  There is a the crescent-shaped fungating soft tissue mass arising in the posterior wall of the right colon on series 5 image 30 measuring 22 x 46 x 23 mm (AP by transverse by CC, coronal image 45, sagittal image 20).  A conspicuous but small lymph node in the adjacent mesentery measures 5 mm in short axis (series 2  image 52). No mesenteric lymphadenopathy.  This lesion is nonobstructing.  The lesion is only 1 to 2 cm distal to the ileocecal valve which is best demonstrated on coronal image 33. Distal small bowel loops are nondilated.  There may be mild distal small bowel wall thickening not involving the terminal ileum in the right lower quadrant (series 2 image 66) but this may be artifact of peristalsis.  No dilated small bowel.   Negative stomach, duodenum, spleen, pancreas, and adrenal glands.  No liver mass identified.  Dependent cholelithiasis.  No pericholecystic inflammation. Multiple low-density areas in both kidneys probably are benign cyst.  There is a 7-8 mm calculus located in the right renal pelvis but not causing obstruction at this time.  No hydronephrosis or hydroureter.  Mild perinephric stranding may be age related.  No abdominal free fluid or lymphadenopathy.  Atherosclerosis of the abdominal aorta and iliac arteries.  IMPRESSION: 1.  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 22 x 46 x 23 mm soft tissue mass.  Small but conspicuous mesenteric lymph node nearby measuring 5 mm short axis.  No liver or distant metastatic disease identified in the abdomen pelvis. 2.  Kidney stone at the right renal pelvis but without associated obstruction at this time. 3.  The bladder is distended with evidence of wall thickening.  The prostate is enlarged.  Chronic bladder outlet obstruction suspected and urology follow up to include prostate evaluation recommended. 4.  Cholelithiasis.  Aortic atherosclerosis.  Salient findings discussed by telephone with provider Maren Reamer on 03/11/2012 at  Original Report Authenticated By: Harley Hallmark, M.D.     ASSESSMENT:   Principal Problem:  *Abdominal pain Active Problems:  Anemia  CAD (coronary artery disease)  HTN (hypertension)  COPD (chronic obstructive pulmonary disease)  H/O: CVA (cerebrovascular accident)  Diabetes mellitus  Dehydration     PLAN:   Heme positive anemia, mass in the right colon c/w neoplasm, will go ahead and prep for colonoscopy, will discuss with the daughter. Would transfuse in anticipation of surgery. Check CEA level     LOS: 1 day   Lina Sar  03/12/2012, 7:36 AM

## 2012-03-12 NOTE — Progress Notes (Signed)
Since this is not diverticulitis will discontinue antibiotics. In addition, patient is on Plavix. If this proves to be colon cancer patient will likely need resection so Plavix should probably be held. I will talk with admitting team about this.   Willette Cluster, NP-C / Lina Sar, MD

## 2012-03-12 NOTE — Consult Note (Signed)
[name]. Janneth Krasner, M.D.  03/12/2012

## 2012-03-12 NOTE — Progress Notes (Signed)
Patient unable to physically sign blood consent. Mentally able to ok consent and states he's received blood before.  Consent signed by patient to best of ability. Witnessed by myself.

## 2012-03-12 NOTE — Progress Notes (Signed)
Subjective: No new issues, overnight. BP low this AM.  Objective: Vital signs in last 24 hours: Temp:  [98.3 F (36.8 C)-99.3 F (37.4 C)] 98.3 F (36.8 C) (06/25 0557) Pulse Rate:  [76-100] 76  (06/25 0557) Resp:  [18-20] 18  (06/25 0557) BP: (81-144)/(50-72) 81/50 mmHg (06/25 0557) SpO2:  [99 %] 99 % (06/25 0557) Weight:  [67.677 kg (149 lb 3.2 oz)-67.858 kg (149 lb 9.6 oz)] 67.677 kg (149 lb 3.2 oz) (06/24 1617) Weight change:  Last BM Date: 03/11/12  Intake/Output from previous day: 06/24 0701 - 06/25 0700 In: 1547.5 [P.O.:240; I.V.:907.5; IV Piggyback:400] Out: 900 [Urine:900]     Physical Exam: General: Comfortable.  Alert, communicative, albeit with dysarthria and expressive dysphasia, not short of breath at rest.  HEENT: Moderate clinical pallor, no jaundice, no conjunctival injection or discharge. Hydration status appears fair.  NECK: Supple, JVP not seen, no carotid bruits, no palpable lymphadenopathy, no palpable goiter.  CHEST: Clinically clear to auscultation, no wheezes, no crackles. Mid-line sternotomy scar is noted.  HEART: Sounds 1 and 2 heard, normal, regular, no murmurs.  ABDOMEN: Full, soft, mildly tender in epigastrium, mid abdomen and LLQ. no palpable organomegaly, no palpable masses. Bowel sounds are heard.  GENITALIA: Not examined.  LOWER EXTREMITIES: No pitting edema, palpable peripheral pulses.  MUSCULOSKELETAL SYSTEM: Generalized osteoarthritic changes, otherwise, normal.  CENTRAL NERVOUS SYSTEM: Dysarthria, expressive dysphasia, mild RUE weakness. .  Lab Results:  Basename 03/12/12 0436  WBC 8.9  HGB 7.1*  HCT 22.5*  PLT 257    Basename 03/12/12 0436 03/11/12 2037  NA 136 --  K 4.5 --  CL 103 --  CO2 24 --  GLUCOSE 215* --  BUN 16 17  CREATININE 0.88 0.93  CALCIUM 8.4 --   No results found for this or any previous visit (from the past 240 hour(s)).   Studies/Results: Ct Abdomen Pelvis W Contrast  03/11/2012  *RADIOLOGY REPORT*   Clinical Data: 71 year old male with left lower quadrant abdominal pain.  History of kidney stones.  CT ABDOMEN AND PELVIS WITH CONTRAST  Technique:  Multidetector CT imaging of the abdomen and pelvis was performed following the standard protocol during bolus administration of intravenous contrast.  Contrast: 80mL OMNIPAQUE IOHEXOL 300 MG/ML  SOLN  Comparison: Abdominal radiographs 03/07/2012.  Findings: Dependent opacity and crowding of markings at the lung bases compatible with atelectasis.  Mild cardiomegaly.  No pericardial or pleural effusion.  Sequelae median sternotomy. Degenerative changes in the spine. No acute osseous abnormality identified.  No pelvic free fluid.  The bladder is distended and yet there is evidence of bladder wall thickening.  No perivesical stranding. The prostate is enlarged and lobulated and there is mild contour deformity at the base of the bladder.  Negative distal colon aside from retained stool.  Oral contrast has reached the splenic flexure.  No wall thickening or inflammation of the left colon.  Contrast mixed with stool throughout the more proximal colon.  There is a the crescent-shaped fungating soft tissue mass arising in the posterior wall of the right colon on series 5 image 30 measuring 22 x 46 x 23 mm (AP by transverse by CC, coronal image 45, sagittal image 20).  A conspicuous but small lymph node in the adjacent mesentery measures 5 mm in short axis (series 2 image 52). No mesenteric lymphadenopathy.  This lesion is nonobstructing.  The lesion is only 1 to 2 cm distal to the ileocecal valve which is best demonstrated on coronal image 33. Distal  small bowel loops are nondilated.  There may be mild distal small bowel wall thickening not involving the terminal ileum in the right lower quadrant (series 2 image 66) but this may be artifact of peristalsis.  No dilated small bowel.  Negative stomach, duodenum, spleen, pancreas, and adrenal glands.  No liver mass identified.   Dependent cholelithiasis.  No pericholecystic inflammation. Multiple low-density areas in both kidneys probably are benign cyst.  There is a 7-8 mm calculus located in the right renal pelvis but not causing obstruction at this time.  No hydronephrosis or hydroureter.  Mild perinephric stranding may be age related.  No abdominal free fluid or lymphadenopathy.  Atherosclerosis of the abdominal aorta and iliac arteries.  IMPRESSION: 1.  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 22 x 46 x 23 mm soft tissue mass.  Small but conspicuous mesenteric lymph node nearby measuring 5 mm short axis.  No liver or distant metastatic disease identified in the abdomen pelvis. 2.  Kidney stone at the right renal pelvis but without associated obstruction at this time. 3.  The bladder is distended with evidence of wall thickening.  The prostate is enlarged.  Chronic bladder outlet obstruction suspected and urology follow up to include prostate evaluation recommended. 4.  Cholelithiasis.  Aortic atherosclerosis.  Salient findings discussed by telephone with provider Maren Reamer on 03/11/2012 at  Original Report Authenticated By: Harley Hallmark, M.D.    Medications: Scheduled Meds:   . aspirin EC  81 mg Oral Daily  . ciprofloxacin  400 mg Intravenous Q12H  . clopidogrel  75 mg Oral Q breakfast  . ezetimibe-simvastatin  1 tablet Oral QHS  . insulin aspart  0-5 Units Subcutaneous QHS  . insulin aspart  0-9 Units Subcutaneous TID WC  . metronidazole  500 mg Intravenous Q8H  . multivitamin with minerals  1 tablet Oral Daily  . oxazepam  30 mg Oral QHS  . peg 3350 powder  1 kit Oral Once  . sodium chloride  500 mL Intravenous STAT  . sodium chloride  3 mL Intravenous Q12H  . vitamin C  500 mg Oral BID  . zinc sulfate  220 mg Oral Daily  . DISCONTD: irbesartan  150 mg Oral Daily   Continuous Infusions:   . sodium chloride 75 mL/hr at 03/11/12  1754   PRN Meds:.acetaminophen, acetaminophen, albuterol, HYDROmorphone (DILAUDID) injection, iohexol, ipratropium, nitroGLYCERIN, ondansetron (ZOFRAN) IV, ondansetron, oxyCODONE  Assessment/Plan:  Active Problems:  1. Abdominal pain/Colonic mass: Patient presents with approximately one week of altered bowel habit/constipation, left lower quadrant abdominal pain, and a low grade pyrexia. Clinically, this constellation of clinical features, was suspicious for acute diverticulitis. Reportedly, patient has recently been treated with oral Amoxicillin, and this may have somewhat muddied the picture. He was started on maintenance iv fluids, and empiric Ciprofloxacin/Flagyl.  Abdominal/pelvic CT scan showed findings most consistent with adenocarcinoma of the cecum/ascending colon, 22 x 46 x 23 mm soft tissue mass. Small but conspicuous mesenteric lymph node nearby measuring 5 mm short axis. No liver or distant metastatic disease identified in the abdomen pelvis. Dr Lina Sar has seen patient today, and colonoscopy is planned. Afterwards, patient will need surgical consult, and staging head and chest CT scans. CEA has been ordered.  2. Dehydration: Patient had elevated BUN/Creatinine ratio, consistent with dehydration, on presentation. This has now resolved with iv fluids.  3. Anemia: Patient has a normocytic anemia, which appeared mild at  presentation at 9.4, against a background of dehydration and hemoconcentration. With iv fluids, overnight hemoglobin has dropped to 7.1. Appropriate hematinic studies and stool guaiac testing have been done. We shall transfuse 2 units PRBC today. 4. CAD (coronary artery disease): Patient has a known history of CAD, s/p MI,s/p CABG. He appears stable and asymptomatic from this viewpoint. We shall monitor telemetrically.  5. HTN (hypertension): BP is low at 81/50, this AM we have discontinued antihypertensives, bolused 500 ml NS.  Blood transfusion will supply needed volume.    6. COPD (chronic obstructive pulmonary disease): Stable/aymptomatic.  7. H/O: CVA (cerebrovascular accident): Stable.  8. Diabetes mellitus: This is insulin-requiring type 2, and appears sub-optimally controlled, based on random glucose. Managing with SSI.      LOS: 1 day   Jishnu Jenniges,CHRISTOPHER 03/12/2012, 8:44 AM

## 2012-03-12 NOTE — Progress Notes (Signed)
INITIAL ADULT NUTRITION ASSESSMENT Date: 03/12/2012   Time: 12:36 PM Reason for Assessment: Nutrition Risk for dysphagia  ASSESSMENT: Male 71 y.o.  Dx: Abdominal pain  Hx:  Past Medical History  Diagnosis Date  . Myocardial infarction   . CVA (cerebral infarction)   . Diabetes mellitus   . Hypertension   . Diabetes mellitus   . Aphasia   . Dysphagia   . Stroke   . COPD (chronic obstructive pulmonary disease)   . Shortness of breath     Related Meds:  Scheduled Meds:   . aspirin EC  81 mg Oral Daily  . ciprofloxacin  400 mg Intravenous Q12H  . ezetimibe-simvastatin  1 tablet Oral QHS  . insulin aspart  0-5 Units Subcutaneous QHS  . insulin aspart  0-9 Units Subcutaneous TID WC  . metronidazole  500 mg Intravenous Q8H  . multivitamin with minerals  1 tablet Oral Daily  . oxazepam  30 mg Oral QHS  . peg 3350 powder  1 kit Oral Once  . sodium chloride  500 mL Intravenous STAT  . sodium chloride  3 mL Intravenous Q12H  . vitamin C  500 mg Oral BID  . zinc sulfate  220 mg Oral Daily  . DISCONTD: clopidogrel  75 mg Oral Q breakfast  . DISCONTD: irbesartan  150 mg Oral Daily   Continuous Infusions:   . sodium chloride 75 mL/hr at 03/11/12 1754   PRN Meds:.acetaminophen, acetaminophen, albuterol, HYDROmorphone (DILAUDID) injection, iohexol, ipratropium, nitroGLYCERIN, ondansetron (ZOFRAN) IV, ondansetron, oxyCODONE   Ht: 5\' 4"  (162.6 cm)  Wt: 149 lb 3.2 oz (67.677 kg)  Ideal Wt: 59.09 kg % Ideal Wt: 114.6% Wt Readings from Last 10 Encounters:  03/11/12 149 lb 3.2 oz (67.677 kg)  03/11/12 149 lb 9.6 oz (67.858 kg)  01/31/12 128 lb 12 oz (58.4 kg)  12/28/11 150 lb (68.04 kg)    Body mass index is 25.61 kg/(m^2). (Overweight)  Food/Nutrition Related Hx: Patient with difficulty communicating. Patient reported a good appetite. PO intake documented 50% of clear liquids.   Labs:  CMP     Component Value Date/Time   NA 136 03/12/2012 0436   K 4.5 03/12/2012 0436     CL 103 03/12/2012 0436   CO2 24 03/12/2012 0436   GLUCOSE 215* 03/12/2012 0436   BUN 16 03/12/2012 0436   CREATININE 0.88 03/12/2012 0436   CALCIUM 8.4 03/12/2012 0436   PROT 5.9* 03/12/2012 0436   ALBUMIN 3.0* 03/12/2012 0436   AST 20 03/12/2012 0436   ALT 10 03/12/2012 0436   ALKPHOS 65 03/12/2012 0436   BILITOT 0.2* 03/12/2012 0436   GFRNONAA 85* 03/12/2012 0436   GFRAA >90 03/12/2012 0436     Intake/Output Summary (Last 24 hours) at 03/12/12 1251 Last data filed at 03/12/12 1227  Gross per 24 hour  Intake 2027.5 ml  Output   1600 ml  Net  427.5 ml     Diet Order: Clear Liquid  Supplements/Tube Feeding: none at this time   IVF:    sodium chloride Last Rate: 75 mL/hr at 03/11/12 1754    Estimated Nutritional Needs:   Kcal: 1610-9604 Protein: 75.4-88 grams Fluid: 1 ml per kcal intake  NUTRITION DIAGNOSIS: -Inadequate oral intake (NI-2.1).  Status: Ongoing  RELATED TO: limited ability to eat and abdominal pain  AS EVIDENCE BY: PO intake documented 50% of clear liquids.   MONITORING/EVALUATION(Goals): Diet advancement/ tolerance, PO intake, weights, labs 1. PO intake > 75% at meals.  EDUCATION NEEDS: -  No education needs identified at this time  INTERVENTION: 1. Will order patient resource breeze BID.  2. RD to follow for nutrition plan of care.   Dietitian [phone]  DOCUMENTATION CODES Per approved criteria  -Not Applicable    Iven Finn [facility] 03/12/2012, 12:36 PM

## 2012-03-12 NOTE — Progress Notes (Signed)
CARE MANAGEMENT NOTE 03/12/2012  Patient:  [name]   Account Number:  [id number]  Date Initiated:  03/12/2012  Documentation initiated by:  Sayde Lish  Subjective/Objective Assessment:   pt with abd pain and new findings of colonic mass,hgb 7.1     Action/Plan:   lives at home   Anticipated DC Date:  03/15/2012   Anticipated DC Plan:  HOME/SELF CARE  In-house referral  NA      DC Planning Services  NA      [facility] Choice  NA   Choice offered to / List presented to:  NA   DME arranged  NA      DME agency  NA     HH arranged  NA      HH agency  NA   Status of service:  In process, will continue to follow Medicare Important Message given?  NA - LOS <3 / Initial given by admissions (If response is "NO", the following Medicare IM given date fields will be blank) Date Medicare IM given:   Date Additional Medicare IM given:    Discharge Disposition:    Per UR Regulation:  Reviewed for med. necessity/level of care/duration of stay  If discussed at Long Length of Stay Meetings, dates discussed:    Comments:  [id number]/Kendry Pfarr Earlene Plater, RN,  BSN, CCM No discharge needs present at time of this review. Case Management [phone]

## 2012-03-12 NOTE — Progress Notes (Signed)
Inpatient Diabetes Program Recommendations  AACE/ADA: New Consensus Statement on Inpatient Glycemic Control (2009)  Target Ranges:  Prepandial:   less than 140 mg/dL      Peak postprandial:   less than 180 mg/dL (1-2 hours)      Critically ill patients:  140 - 180 mg/dL   Reason for Visit: Results for [name], [name] (MRN [id number]) as of 03/12/2012 13:18  Ref. Range 03/11/2012 16:38 03/11/2012 21:53 03/12/2012 07:34 03/12/2012 11:53  Glucose-Capillary Latest Range: 70-99 mg/dL 409 (H) 811 (H) 914 (H) 214 (H)    Inpatient Diabetes Program Recommendations Insulin - Basal: Consider adding Lantus 12 units daily.  Note: Will follow.

## 2012-03-13 ENCOUNTER — Encounter (HOSPITAL_COMMUNITY)
Admission: AD | Disposition: A | Discharge status: Skilled Nursing Facility | Payer: Self-pay | Source: Ambulatory Visit | Attending: Internal Medicine

## 2012-03-13 ENCOUNTER — Encounter (HOSPITAL_COMMUNITY): Payer: Self-pay

## 2012-03-13 ENCOUNTER — Encounter (HOSPITAL_COMMUNITY): Payer: Self-pay | Admitting: *Deleted

## 2012-03-13 DIAGNOSIS — R1032 Left lower quadrant pain: Secondary | ICD-10-CM

## 2012-03-13 DIAGNOSIS — E860 Dehydration: Secondary | ICD-10-CM

## 2012-03-13 DIAGNOSIS — D126 Benign neoplasm of colon, unspecified: Secondary | ICD-10-CM

## 2012-03-13 DIAGNOSIS — D490 Neoplasm of unspecified behavior of digestive system: Secondary | ICD-10-CM

## 2012-03-13 DIAGNOSIS — E1165 Type 2 diabetes mellitus with hyperglycemia: Secondary | ICD-10-CM

## 2012-03-13 DIAGNOSIS — R112 Nausea with vomiting, unspecified: Secondary | ICD-10-CM

## 2012-03-13 HISTORY — PX: COLONOSCOPY: SHX5424

## 2012-03-13 LAB — GLUCOSE, CAPILLARY: Glucose-Capillary: 157 mg/dL — ABNORMAL HIGH (ref 70–99)

## 2012-03-13 LAB — TYPE AND SCREEN
ABO/RH(D): O POS
Unit division: 0

## 2012-03-13 LAB — BASIC METABOLIC PANEL
CO2: 19 mEq/L (ref 19–32)
Calcium: 8.6 mg/dL (ref 8.4–10.5)
Creatinine, Ser: 0.78 mg/dL (ref 0.50–1.35)
GFR calc Af Amer: >90 mL/min (ref >90)
Sodium: 136 mEq/L (ref 135–145)

## 2012-03-13 LAB — CBC
MCH: 26.4 pg (ref 26.0–34.0)
MCV: 81.1 fL (ref 78.0–100.0)
Platelets: 231 10*3/uL (ref 150–400)
RBC: 3.75 MIL/uL — ABNORMAL LOW (ref 4.22–5.81)
RDW: 14.9 % (ref 11.5–15.5)
WBC: 5.4 10*3/uL (ref 4.0–10.5)

## 2012-03-13 SURGERY — COLONOSCOPY
Anesthesia: Moderate Sedation

## 2012-03-13 MED ORDER — FLEET ENEMA 7-19 GM/118ML RE ENEM
2.0000 | ENEMA | Freq: Once | RECTAL | Status: AC
  Administered 2012-03-13: 2 via RECTAL
  Filled 2012-03-13: qty 2

## 2012-03-13 MED ORDER — MAGNESIUM CITRATE PO SOLN
1.0000 | Freq: Once | ORAL | Status: AC
  Administered 2012-03-13: 1 via ORAL

## 2012-03-13 MED ORDER — FENTANYL NICU IV SYRINGE 50 MCG/ML
INJECTION | INTRAMUSCULAR | Status: DC | PRN
Start: 2012-03-13 — End: 2012-03-13
  Administered 2012-03-13: 12.5 ug via INTRAVENOUS
  Administered 2012-03-13 (×2): 25 ug via INTRAVENOUS

## 2012-03-13 MED ORDER — MIDAZOLAM HCL 10 MG/2ML IJ SOLN
INTRAMUSCULAR | Status: DC | PRN
  Administered 2012-03-13 (×2): 2 mg via INTRAVENOUS
  Administered 2012-03-13: 1 mg via INTRAVENOUS

## 2012-03-13 MED ORDER — DIPHENHYDRAMINE HCL 50 MG/ML IJ SOLN
INTRAMUSCULAR | Status: DC | PRN
  Administered 2012-03-13: 25 mg via INTRAVENOUS

## 2012-03-13 NOTE — Progress Notes (Signed)
Pt drank about half of Moviprep. Pt was having frequent bowel movements (as expected and explained to pt). Pt became very upset and tearful and refused to drink anymore of the bowel prep. It was explained to pt the importance of the bowel prep and the procedure. Pt continued to become more upset. Will continue to encourage pt to drink bowel frequently. However, pt is refusing at this time.

## 2012-03-13 NOTE — Progress Notes (Signed)
Subjective: S/p colonoscpy, denies any [name]/o  Objective: Vital signs in last 24 hours: Temp:  [98.1 F (36.7 [name])-99 F (37.2 [name])] 99 F (37.2 [name]) (06/26 0931) Pulse Rate:  [72-90] 74  (06/26 0700) Resp:  [17-73] 30  (06/26 1230) BP: (94-173)/(52-77) 95/62 mmHg (06/26 1230) SpO2:  [82 %-100 %] 82 % (06/26 1230) Weight change:  Last BM Date: 03/12/12  Intake/Output from previous day: 06/25 0701 - 06/26 0700 In: 1430 [P.O.:480; I.V.:250; Blood:700] Out: 2400 [Urine:2400]     Physical Exam: General: Comfortable.  Alert, communicative,difficult to understand 2/2 dysarthria/expressive dysphasia, not short of breath at rest.  HEENT: Moderate clinical pallor, no jaundice, no conjunctival injection or discharge. Hydration status appears fair.  NECK: Supple, JVP not seen, no carotid bruits, no palpable lymphadenopathy, no palpable goiter.  CHEST: Clinically clear to auscultation, no wheezes, no crackles. Mid-line sternotomy scar is noted.  HEART: Sounds 1 and 2 heard, normal, regular, no murmurs.  ABDOMEN: Full, soft, mildly tender in epigastrium, mid abdomen and LLQ. no palpable organomegaly, no palpable masses. Bowel sounds are heard.  GENITALIA: Not examined.  LOWER EXTREMITIES: No pitting edema, palpable peripheral pulses.  MUSCULOSKELETAL SYSTEM: Generalized osteoarthritic changes, otherwise, normal.  CENTRAL NERVOUS SYSTEM: Dysarthria, expressive dysphasia, mild RUE weakness. .  Lab Results:  Basename 03/13/12 0510 03/12/12 0436  WBC 5.4 8.9  HGB 9.9* 7.1*  HCT 30.4* 22.5*  PLT 231 257    Basename 03/13/12 0510 03/12/12 0436  NA 136 136  K 4.0 4.5  CL 106 103  CO2 19 24  GLUCOSE 157* 215*  BUN 12 16  CREATININE 0.78 0.88  CALCIUM 8.6 8.4   Recent Results (from the past 240 hour(s))  CULTURE, BLOOD (ROUTINE X 2)     Status: Normal (Preliminary result)   Collection Time   03/11/12  5:55 PM      Component Value Range Status Comment   Specimen Description BLOOD RIGHT ARM    Final    Special Requests BOTTLES DRAWN AEROBIC AND ANAEROBIC 10CC   Final    Culture  Setup Time [id number]   Final    Culture     Final    Value:        BLOOD CULTURE RECEIVED NO GROWTH TO DATE CULTURE WILL BE HELD FOR 5 DAYS BEFORE ISSUING A FINAL NEGATIVE REPORT   Report Status PENDING   Incomplete   CULTURE, BLOOD (ROUTINE X 2)     Status: Normal (Preliminary result)   Collection Time   03/11/12  6:05 PM      Component Value Range Status Comment   Specimen Description BLOOD RIGHT HAND   Final    Special Requests BOTTLES DRAWN AEROBIC AND ANAEROBIC 10CC   Final    Culture  Setup Time [id number]   Final    Culture     Final    Value:        BLOOD CULTURE RECEIVED NO GROWTH TO DATE CULTURE WILL BE HELD FOR 5 DAYS BEFORE ISSUING A FINAL NEGATIVE REPORT   Report Status PENDING   Incomplete      Studies/Results: Ct Abdomen Pelvis W Contrast  03/11/2012  *RADIOLOGY REPORT*  Clinical Data: 71 year old male with left lower quadrant abdominal pain.  History of kidney stones.  CT ABDOMEN AND PELVIS WITH CONTRAST  Technique:  Multidetector CT imaging of the abdomen and pelvis was performed following the standard protocol during bolus administration of intravenous contrast.  Contrast: 80mL OMNIPAQUE IOHEXOL 300 MG/ML  SOLN  Comparison:  Abdominal radiographs 03/07/2012.  Findings: Dependent opacity and crowding of markings at the lung bases compatible with atelectasis.  Mild cardiomegaly.  No pericardial or pleural effusion.  Sequelae median sternotomy. Degenerative changes in the spine. No acute osseous abnormality identified.  No pelvic free fluid.  The bladder is distended and yet there is evidence of bladder wall thickening.  No perivesical stranding. The prostate is enlarged and lobulated and there is mild contour deformity at the base of the bladder.  Negative distal colon aside from retained stool.  Oral contrast has reached the splenic flexure.  No wall thickening or inflammation of the left  colon.  Contrast mixed with stool throughout the more proximal colon.  There is a the crescent-shaped fungating soft tissue mass arising in the posterior wall of the right colon on series 5 image 30 measuring 22 x 46 x 23 mm (AP by transverse by CC, coronal image 45, sagittal image 20).  A conspicuous but small lymph node in the adjacent mesentery measures 5 mm in short axis (series 2 image 52). No mesenteric lymphadenopathy.  This lesion is nonobstructing.  The lesion is only 1 to 2 cm distal to the ileocecal valve which is best demonstrated on coronal image 33. Distal small bowel loops are nondilated.  There may be mild distal small bowel wall thickening not involving the terminal ileum in the right lower quadrant (series 2 image 66) but this may be artifact of peristalsis.  No dilated small bowel.  Negative stomach, duodenum, spleen, pancreas, and adrenal glands.  No liver mass identified.  Dependent cholelithiasis.  No pericholecystic inflammation. Multiple low-density areas in both kidneys probably are benign cyst.  There is a 7-8 mm calculus located in the right renal pelvis but not causing obstruction at this time.  No hydronephrosis or hydroureter.  Mild perinephric stranding may be age related.  No abdominal free fluid or lymphadenopathy.  Atherosclerosis of the abdominal aorta and iliac arteries.  IMPRESSION: 1.  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 22 x 46 x 23 mm soft tissue mass.  Small but conspicuous mesenteric lymph node nearby measuring 5 mm short axis.  No liver or distant metastatic disease identified in the abdomen pelvis. 2.  Kidney stone at the right renal pelvis but without associated obstruction at this time. 3.  The bladder is distended with evidence of wall thickening.  The prostate is enlarged.  Chronic bladder outlet obstruction suspected and urology follow up to include prostate evaluation recommended. 4.   Cholelithiasis.  Aortic atherosclerosis.  Salient findings discussed by telephone with provider Maren Reamer on 03/11/2012 at  Original Report Authenticated By: Harley Hallmark, M.D.    Medications: Scheduled Meds:    . aspirin EC  81 mg Oral Daily  . ciprofloxacin  400 mg Intravenous Q12H  . ezetimibe-simvastatin  1 tablet Oral QHS  . feeding supplement  1 Container Oral BID BM  . insulin aspart  0-5 Units Subcutaneous QHS  . insulin aspart  0-9 Units Subcutaneous TID WC  . LORazepam  2 mg Oral QHS  . magnesium citrate  1 Bottle Oral Once  . metronidazole  500 mg Intravenous Q8H  . multivitamin with minerals  1 tablet Oral Daily  . peg 3350 powder  1 kit Oral Once  . sodium chloride  3 mL Intravenous Q12H  . sodium phosphate  2 enema Rectal Once  . vitamin [name]  500 mg Oral BID  .  zinc sulfate  220 mg Oral Daily  . DISCONTD: oxazepam  30 mg Oral QHS   Continuous Infusions:    . sodium chloride 500 mL (03/13/12 0944)   PRN Meds:.acetaminophen, acetaminophen, albuterol, HYDROmorphone (DILAUDID) injection, ipratropium, nitroGLYCERIN, ondansetron (ZOFRAN) IV, ondansetron, oxyCODONE, DISCONTD: diphenhydrAMINE, DISCONTD: fentaNYL, DISCONTD: midazolam  Assessment/Plan:  Active Problems:  1. Abdominal pain/Colonic mass: Patient presents with approximately one week of altered bowel habit/constipation, left lower quadrant abdominal pain, and a low grade pyrexia>was empirically treated with amoxicillin. Ciprofloxacin/Flagyl started for poss. Diverticulitis on admit.  Abdominal/pelvic CT scan showed findings most consistent with adenocarcinoma of the cecum/ascending colon, 22 x 46 x 23 mm soft tissue mass. Small but conspicuous mesenteric lymph node nearby measuring 5 mm short axis. No liver or distant metastatic disease identified in the abdomen pelvis.  Dr Lina Sar saw pt, and colonoscopy was done today - mass in ascending colon. Afterwards, patient will need surgical consult, and CEA  Of  6/25 3.2. Await further recs per GI pending results 2. Dehydration: Patient had elevated BUN/Creatinine ratio, consistent with dehydration, on presentation. This has now resolved with iv fluids.  3. Anemia, Fe def: Patient has a normocytic anemia, which appeared mild at presentation at 9.4, against a background of dehydration and hemoconcentration. With iv fluids, overnight hemoglobin has dropped to 7.1 -hgb improved to 9.9s/p transfusion 4. CAD (coronary artery disease): Patient has a known history of CAD, s/p MI,s/p CABG. He appears stable and asymptomatic from this viewpoint. We shall monitor telemetrically.  5. HTN (hypertension): BPimproved ss/p vol resuscitation.  6. COPD (chronic obstructive pulmonary disease): Stable/aymptomatic.  7. H/O: CVA (cerebrovascular accident): Stable.  8. 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

## 2012-03-13 NOTE — Consult Note (Signed)
Reason for Consult:mass in right colon Referring Physician: Dr. Maryelizabeth [name] is an 71 y.o. male.  HPI: 71 yo wm with history of stroke earlier this year and has residual dysarthria. Over the last week he says his head did not feel right. He does not claim to be having any abdominal pain although it is difficult to understand him and there is no family present currently. He was found to be anemic and as part of his work up he underwent colonoscopy today which revealed a nonobstructing mass in the ascending colon. CEA was normal. CT showed no evidence of metastatic disease.  Past Medical History  Diagnosis Date  . Myocardial infarction   . CVA (cerebral infarction)   . Diabetes mellitus   . Hypertension   . Diabetes mellitus   . Aphasia   . Dysphagia   . Stroke   . COPD (chronic obstructive pulmonary disease)   . Shortness of breath   . Anemia     Past Surgical History  Procedure Date  . Coronary artery bypass graft   . Appendectomy   . Dental surgery     Family History  Problem Relation Age of Onset  . Breast cancer Mother   . Heart disease Brother   . Colon cancer Maternal Uncle     Social History:  reports that he quit smoking about 2 months ago. His smoking use included Cigarettes. He quit after 16 years of use. He has never used smokeless tobacco. He reports that he does not drink alcohol or use illicit drugs.  Allergies: No Known Allergies  Medications: I have reviewed the patient'[name] current medications.  Results for orders placed during the hospital encounter of 03/11/12 (from the past 48 hour([name]))  GLUCOSE, CAPILLARY     Status: Abnormal   Collection Time   03/11/12  4:38 PM      Component Value Range Comment   Glucose-Capillary 190 (*) 70 - 99 mg/dL    Comment 1 Notify RN     CULTURE, BLOOD (ROUTINE X 2)     Status: Normal (Preliminary result)   Collection Time   03/11/12  5:55 PM      Component Value Range Comment   Specimen Description BLOOD RIGHT  ARM      Special Requests BOTTLES DRAWN AEROBIC AND ANAEROBIC 10CC      Culture  Setup Time [id number]      Culture        Value:        BLOOD CULTURE RECEIVED NO GROWTH TO DATE CULTURE WILL BE HELD FOR 5 DAYS BEFORE ISSUING A FINAL NEGATIVE REPORT   Report Status PENDING     IRON AND TIBC     Status: Abnormal   Collection Time   03/11/12  6:05 PM      Component Value Range Comment   Iron 16 (*) 42 - 135 ug/dL    TIBC 147 (*) 829 - 562 ug/dL    Saturation Ratios 4 (*) 20 - 55 %    UIBC 436 (*) 125 - 400 ug/dL   FERRITIN     Status: Abnormal   Collection Time   03/11/12  6:05 PM      Component Value Range Comment   Ferritin 12 (*) 22 - 322 ng/mL   VITAMIN B12     Status: Normal   Collection Time   03/11/12  6:05 PM      Component Value Range Comment   Vitamin B-12 442  211 - 911 pg/mL   FOLATE RBC     Status: Abnormal   Collection Time   03/11/12  6:05 PM      Component Value Range Comment   RBC Folate 1099 (*) >=366 ng/mL Reference range not established for pediatric patients.  HEMOGLOBIN A1C     Status: Abnormal   Collection Time   03/11/12  6:05 PM      Component Value Range Comment   Hemoglobin A1C 7.2 (*) <5.7 %    Mean Plasma Glucose 160 (*) <117 mg/dL   CULTURE, BLOOD (ROUTINE X 2)     Status: Normal (Preliminary result)   Collection Time   03/11/12  6:05 PM      Component Value Range Comment   Specimen Description BLOOD RIGHT HAND      Special Requests BOTTLES DRAWN AEROBIC AND ANAEROBIC 10CC      Culture  Setup Time [id number]      Culture        Value:        BLOOD CULTURE RECEIVED NO GROWTH TO DATE CULTURE WILL BE HELD FOR 5 DAYS BEFORE ISSUING A FINAL NEGATIVE REPORT   Report Status PENDING     BUN     Status: Normal   Collection Time   03/11/12  8:37 PM      Component Value Range Comment   BUN 17  6 - 23 mg/dL   CREATININE, SERUM     Status: Abnormal   Collection Time   03/11/12  8:37 PM      Component Value Range Comment   Creatinine, Ser 0.93  0.50 -  1.35 mg/dL    GFR calc non Af Amer 83 (*) >90 mL/min    GFR calc Af Amer >90  >90 mL/min   GLUCOSE, CAPILLARY     Status: Abnormal   Collection Time   03/11/12  9:53 PM      Component Value Range Comment   Glucose-Capillary 167 (*) 70 - 99 mg/dL    Comment 1 Notify RN     URINALYSIS, ROUTINE W REFLEX MICROSCOPIC     Status: Abnormal   Collection Time   03/11/12 11:55 PM      Component Value Range Comment   Color, Urine YELLOW  YELLOW    APPearance CLEAR  CLEAR    Specific Gravity, Urine 1.024  1.005 - 1.030    pH 6.5  5.0 - 8.0    Glucose, UA 100 (*) NEGATIVE mg/dL    Hgb urine dipstick NEGATIVE  NEGATIVE    Bilirubin Urine NEGATIVE  NEGATIVE    Ketones, ur NEGATIVE  NEGATIVE mg/dL    Protein, ur 30 (*) NEGATIVE mg/dL    Urobilinogen, UA 0.2  0.0 - 1.0 mg/dL    Nitrite NEGATIVE  NEGATIVE    Leukocytes, UA NEGATIVE  NEGATIVE   URINE MICROSCOPIC-ADD ON     Status: Normal   Collection Time   03/11/12 11:55 PM      Component Value Range Comment   Urine-Other        Value: NO FORMED ELEMENTS SEEN ON URINE MICROSCOPIC EXAMINATION  COMPREHENSIVE METABOLIC PANEL     Status: Abnormal   Collection Time   03/12/12  4:36 AM      Component Value Range Comment   Sodium 136  135 - 145 mEq/L    Potassium 4.5  3.5 - 5.1 mEq/L    Chloride 103  96 - 112 mEq/L    CO2 24  19 - 32 mEq/L    Glucose, Bld 215 (*) 70 - 99 mg/dL    BUN 16  6 - 23 mg/dL    Creatinine, Ser 1.61  0.50 - 1.35 mg/dL    Calcium 8.4  8.4 - 09.6 mg/dL    Total Protein 5.9 (*) 6.0 - 8.3 g/dL    Albumin 3.0 (*) 3.5 - 5.2 g/dL    AST 20  0 - 37 U/L NO VISIBLE HEMOLYSIS   ALT 10  0 - 53 U/L    Alkaline Phosphatase 65  39 - 117 U/L    Total Bilirubin 0.2 (*) 0.3 - 1.2 mg/dL    GFR calc non Af Amer 85 (*) >90 mL/min    GFR calc Af Amer >90  >90 mL/min   CBC     Status: Abnormal   Collection Time   03/12/12  4:36 AM      Component Value Range Comment   WBC 8.9  4.0 - 10.5 K/uL    RBC 2.83 (*) 4.22 - 5.81 MIL/uL     Hemoglobin 7.1 (*) 13.0 - 17.0 g/dL REPEATED TO VERIFY   HCT 22.5 (*) 39.0 - 52.0 %    MCV 79.5  78.0 - 100.0 fL    MCH 25.1 (*) 26.0 - 34.0 pg    MCHC 31.6  30.0 - 36.0 g/dL    RDW 04.5  40.9 - 81.1 %    Platelets 257  150 - 400 K/uL   GLUCOSE, CAPILLARY     Status: Abnormal   Collection Time   03/12/12  7:34 AM      Component Value Range Comment   Glucose-Capillary 175 (*) 70 - 99 mg/dL    Comment 1 Notify RN     PREPARE RBC (CROSSMATCH)     Status: Normal   Collection Time   03/12/12  9:00 AM      Component Value Range Comment   Order Confirmation ORDER PROCESSED BY BLOOD BANK     CEA     Status: Normal   Collection Time   03/12/12  9:40 AM      Component Value Range Comment   CEA 3.2  0.0 - 5.0 ng/mL   TYPE AND SCREEN     Status: Normal   Collection Time   03/12/12  9:40 AM      Component Value Range Comment   ABO/RH(D) O POS      Antibody Screen NEG      Sample Expiration 03/15/2012      Unit Number [id number]      Blood Component Type RED CELLS,LR      Unit division 00      Status of Unit ISSUED,FINAL      Transfusion Status OK TO TRANSFUSE      Crossmatch Result Compatible      Unit Number [id number]      Blood Component Type RBC LR PHER1      Unit division 00      Status of Unit ISSUED,FINAL      Transfusion Status OK TO TRANSFUSE      Crossmatch Result Compatible     GLUCOSE, CAPILLARY     Status: Abnormal   Collection Time   03/12/12 11:53 AM      Component Value Range Comment   Glucose-Capillary 214 (*) 70 - 99 mg/dL    Comment 1 Documented in Chart      Comment 2 Notify RN     GLUCOSE, CAPILLARY  Status: Abnormal   Collection Time   03/12/12  4:56 PM      Component Value Range Comment   Glucose-Capillary 164 (*) 70 - 99 mg/dL    Comment 1 Notify RN     GLUCOSE, CAPILLARY     Status: Abnormal   Collection Time   03/12/12 10:29 PM      Component Value Range Comment   Glucose-Capillary 183 (*) 70 - 99 mg/dL    Comment 1 Notify RN     CBC     Status:  Abnormal   Collection Time   03/13/12  5:10 AM      Component Value Range Comment   WBC 5.4  4.0 - 10.5 K/uL    RBC 3.75 (*) 4.22 - 5.81 MIL/uL    Hemoglobin 9.9 (*) 13.0 - 17.0 g/dL    HCT 16.1 (*) 09.6 - 52.0 %    MCV 81.1  78.0 - 100.0 fL    MCH 26.4  26.0 - 34.0 pg    MCHC 32.6  30.0 - 36.0 g/dL    RDW 04.5  40.9 - 81.1 %    Platelets 231  150 - 400 K/uL   BASIC METABOLIC PANEL     Status: Abnormal   Collection Time   03/13/12  5:10 AM      Component Value Range Comment   Sodium 136  135 - 145 mEq/L    Potassium 4.0  3.5 - 5.1 mEq/L    Chloride 106  96 - 112 mEq/L    CO2 19  19 - 32 mEq/L    Glucose, Bld 157 (*) 70 - 99 mg/dL    BUN 12  6 - 23 mg/dL    Creatinine, Ser 9.14  0.50 - 1.35 mg/dL    Calcium 8.6  8.4 - 78.2 mg/dL    GFR calc non Af Amer 89 (*) >90 mL/min    GFR calc Af Amer >90  >90 mL/min   GLUCOSE, CAPILLARY     Status: Abnormal   Collection Time   03/13/12  8:23 AM      Component Value Range Comment   Glucose-Capillary 198 (*) 70 - 99 mg/dL    Comment 1 Notify RN     GLUCOSE, CAPILLARY     Status: Abnormal   Collection Time   03/13/12  3:01 PM      Component Value Range Comment   Glucose-Capillary 141 (*) 70 - 99 mg/dL    Comment 1 Notify RN       Ct Abdomen Pelvis W Contrast  03/11/2012  *RADIOLOGY REPORT*  Clinical Data: 71 year old male with left lower quadrant abdominal pain.  History of kidney stones.  CT ABDOMEN AND PELVIS WITH CONTRAST  Technique:  Multidetector CT imaging of the abdomen and pelvis was performed following the standard protocol during bolus administration of intravenous contrast.  Contrast: 80mL OMNIPAQUE IOHEXOL 300 MG/ML  SOLN  Comparison: Abdominal radiographs 03/07/2012.  Findings: Dependent opacity and crowding of markings at the lung bases compatible with atelectasis.  Mild cardiomegaly.  No pericardial or pleural effusion.  Sequelae median sternotomy. Degenerative changes in the spine. No acute osseous abnormality identified.  No  pelvic free fluid.  The bladder is distended and yet there is evidence of bladder wall thickening.  No perivesical stranding. The prostate is enlarged and lobulated and there is mild contour deformity at the base of the bladder.  Negative distal colon aside from retained stool.  Oral contrast has reached the splenic  flexure.  No wall thickening or inflammation of the left colon.  Contrast mixed with stool throughout the more proximal colon.  There is a the crescent-shaped fungating soft tissue mass arising in the posterior wall of the right colon on series 5 image 30 measuring 22 x 46 x 23 mm (AP by transverse by CC, coronal image 45, sagittal image 20).  A conspicuous but small lymph node in the adjacent mesentery measures 5 mm in short axis (series 2 image 52). No mesenteric lymphadenopathy.  This lesion is nonobstructing.  The lesion is only 1 to 2 cm distal to the ileocecal valve which is best demonstrated on coronal image 33. Distal small bowel loops are nondilated.  There may be mild distal small bowel wall thickening not involving the terminal ileum in the right lower quadrant (series 2 image 66) but this may be artifact of peristalsis.  No dilated small bowel.  Negative stomach, duodenum, spleen, pancreas, and adrenal glands.  No liver mass identified.  Dependent cholelithiasis.  No pericholecystic inflammation. Multiple low-density areas in both kidneys probably are benign cyst.  There is a 7-8 mm calculus located in the right renal pelvis but not causing obstruction at this time.  No hydronephrosis or hydroureter.  Mild perinephric stranding may be age related.  No abdominal free fluid or lymphadenopathy.  Atherosclerosis of the abdominal aorta and iliac arteries.  IMPRESSION: 1.  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 22 x 46 x 23 mm soft tissue mass.  Small but conspicuous mesenteric lymph node nearby measuring 5 mm short  axis.  No liver or distant metastatic disease identified in the abdomen pelvis. 2.  Kidney stone at the right renal pelvis but without associated obstruction at this time. 3.  The bladder is distended with evidence of wall thickening.  The prostate is enlarged.  Chronic bladder outlet obstruction suspected and urology follow up to include prostate evaluation recommended. 4.  Cholelithiasis.  Aortic atherosclerosis.  Salient findings discussed by telephone with provider [name] on 03/11/2012 at  Original Report Authenticated By: [name], M.D.    Review of Systems  Constitutional: Positive for malaise/fatigue.  HENT: Negative.   Eyes: Negative.   Respiratory: Negative.   Cardiovascular: Negative.   Gastrointestinal: Positive for abdominal pain.  Genitourinary: Negative.   Musculoskeletal: Negative.   Skin: Negative.   Neurological: Positive for dizziness, speech change and weakness.  Endo/Heme/Allergies: Negative.   Psychiatric/Behavioral: Negative.    Blood pressure 136/65, pulse 74, temperature 97.8 F (36.6 C), temperature source Axillary, resp. rate 20, height 5\' 4"  (1.626 m), weight 149 lb 3.2 oz (67.677 kg), SpO2 96.00%. Physical Exam  Constitutional: He is oriented to person, place, and time. He appears well-developed and well-nourished.  HENT:  Head: Normocephalic and atraumatic.  Eyes: Conjunctivae and EOM are normal. Pupils are equal, round, and reactive to light.  Neck: Normal range of motion. Neck supple.  Cardiovascular: Normal rate, regular rhythm and normal heart sounds.   Respiratory: Effort normal and breath sounds normal.  GI: Soft. Bowel sounds are normal. He exhibits no distension and no mass. There is no tenderness.  Musculoskeletal: Normal range of motion.  Neurological: He is alert and oriented to person, place, and time.       Dysarthric  Skin: Skin is warm and dry.  Psychiatric: He has a normal mood and affect. His behavior is normal.     Assessment/Plan: 71 yo wm with  significant history of heart attack and stroke who is on plavix who now has a mass in the ascending colon that is very suspicious for colon cancer. Biopsies are pending. Since he has no signs of obstruction I would recommend stopping his plavix and obtaining a cardiology consult to evaluate him in preparation for surgery. If he is a candidate for surgery then i think he would be a good candidate for a laparoscopic assisted procedure. We will follow with you.  [name],[name] 03/13/2012, 3:56 PM

## 2012-03-13 NOTE — Progress Notes (Signed)
Subjective Unable to complete the prep  Objective: slow progress in colonoscopy prep, pt drank only 1/2 of the Movi prep, has been having loose stools Vital signs in last 24 hours: Temp:  [98.1 F (36.7 C)-99 F (37.2 C)] 99 F (37.2 C) (06/25 2210) Pulse Rate:  [72-90] 80  (06/25 2210) Resp:  [18-20] 20  (06/25 2210) BP: (134-173)/(61-77) 144/77 mmHg (06/25 2210) SpO2:  [98 %-100 %] 98 % (06/25 2210) Last BM Date: 03/12/12 General:   Alert,  pleasant, cooperative in NAD Head:  Normocephalic and atraumatic. Eyes:  Sclera clear, no icterus.   Conjunctiva pink. Mouth:  No deformity or lesions, dentition normal. Neck:  Supple; no masses or thyromegaly. Heart:  Regular rate and rhythm; no murmurs, clicks, rubs,  or gallops. Lungs:  No wheezes or rales Abdomen:  Soft, normal B.S,' , non tender Msk:  Symmetrical without gross deformities. Normal posture. Pulses:  Normal pulses noted. Extremities:  Without clubbing or edema. Neurologic:  Alert and  oriented x4;  grossly normal neurologically. Skin:  Intact without significant lesions or rashes.  Intake/Output from previous day: 06/25 0701 - 06/26 0700 In: 1430 [P.O.:480; I.V.:250; Blood:700] Out: 1500 [Urine:1500] Intake/Output this shift:    Lab Results:  Basename 03/13/12 0510 03/12/12 0436  WBC 5.4 8.9  HGB 9.9* 7.1*  HCT 30.4* 22.5*  PLT 231 257   BMET  Basename 03/13/12 0510 03/12/12 0436 03/11/12 2037  NA 136 136 --  K 4.0 4.5 --  CL 106 103 --  CO2 19 24 --  GLUCOSE 157* 215* --  BUN 12 16 17   CREATININE 0.78 0.88 0.93  CALCIUM 8.6 8.4 --   LFT  Basename 03/12/12 0436  PROT 5.9*  ALBUMIN 3.0*  AST 20  ALT 10  ALKPHOS 65  BILITOT 0.2*  BILIDIR --  IBILI --   PT/INR No results found for this basename: LABPROT:2,INR:2 in the last 72 hours Hepatitis Panel No results found for this basename: HEPBSAG,HCVAB,HEPAIGM,HEPBIGM in the last 72 hours  Studies/Results: Ct Abdomen Pelvis W  Contrast  03/11/2012  *RADIOLOGY REPORT*  Clinical Data: 71 year old male with left lower quadrant abdominal pain.  History of kidney stones.  CT ABDOMEN AND PELVIS WITH CONTRAST  Technique:  Multidetector CT imaging of the abdomen and pelvis was performed following the standard protocol during bolus administration of intravenous contrast.  Contrast: 80mL OMNIPAQUE IOHEXOL 300 MG/ML  SOLN  Comparison: Abdominal radiographs 03/07/2012.  Findings: Dependent opacity and crowding of markings at the lung bases compatible with atelectasis.  Mild cardiomegaly.  No pericardial or pleural effusion.  Sequelae median sternotomy. Degenerative changes in the spine. No acute osseous abnormality identified.  No pelvic free fluid.  The bladder is distended and yet there is evidence of bladder wall thickening.  No perivesical stranding. The prostate is enlarged and lobulated and there is mild contour deformity at the base of the bladder.  Negative distal colon aside from retained stool.  Oral contrast has reached the splenic flexure.  No wall thickening or inflammation of the left colon.  Contrast mixed with stool throughout the more proximal colon.  There is a the crescent-shaped fungating soft tissue mass arising in the posterior wall of the right colon on series 5 image 30 measuring 22 x 46 x 23 mm (AP by transverse by CC, coronal image 45, sagittal image 20).  A conspicuous but small lymph node in the adjacent mesentery measures 5 mm in short axis (series 2 image 52). No mesenteric lymphadenopathy.  This  lesion is nonobstructing.  The lesion is only 1 to 2 cm distal to the ileocecal valve which is best demonstrated on coronal image 33. Distal small bowel loops are nondilated.  There may be mild distal small bowel wall thickening not involving the terminal ileum in the right lower quadrant (series 2 image 66) but this may be artifact of peristalsis.  No dilated small bowel.  Negative stomach, duodenum, spleen, pancreas, and  adrenal glands.  No liver mass identified.  Dependent cholelithiasis.  No pericholecystic inflammation. Multiple low-density areas in both kidneys probably are benign cyst.  There is a 7-8 mm calculus located in the right renal pelvis but not causing obstruction at this time.  No hydronephrosis or hydroureter.  Mild perinephric stranding may be age related.  No abdominal free fluid or lymphadenopathy.  Atherosclerosis of the abdominal aorta and iliac arteries.  IMPRESSION: 1.  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 22 x 46 x 23 mm soft tissue mass.  Small but conspicuous mesenteric lymph node nearby measuring 5 mm short axis.  No liver or distant metastatic disease identified in the abdomen pelvis. 2.  Kidney stone at the right renal pelvis but without associated obstruction at this time. 3.  The bladder is distended with evidence of wall thickening.  The prostate is enlarged.  Chronic bladder outlet obstruction suspected and urology follow up to include prostate evaluation recommended. 4.  Cholelithiasis.  Aortic atherosclerosis.  Salient findings discussed by telephone with provider Maren Reamer on 03/11/2012 at  Original Report Authenticated By: Harley Hallmark, M.D.     ASSESSMENT:   Principal Problem:  *Abdominal pain Active Problems:  Anemia  CAD (coronary artery disease)  HTN (hypertension)  COPD (chronic obstructive pulmonary disease)  H/O: CVA (cerebrovascular accident)  Diabetes mellitus  Dehydration  Nonspecific abnormal finding in stool contents  Nonspecific (abnormal) findings on radiological and other examination of gastrointestinal tract     PLAN:   Pt is for colonoscopy today to evaluate right colon lesion suggestive of carcinoma, on CT scan.will will have to modify the prep to have him cleaned out for the procedure: Fleet's enema x2 and bottle of Mag citrate. Check CEA level,  Hgb up to 9.0      LOS: 2 days   Lina Sar  03/13/2012, 7:14 AM

## 2012-03-13 NOTE — Op Note (Signed)
[facility] [address] [place], [place]  [address]  COLONOSCOPY PROCEDURE REPORT  PATIENT:  [name], [name]  MR#:  [id number] BIRTHDATE:  12-03-1940, 71 yrs. old  GENDER:  male ENDOSCOPIST:  Hedwig Morton. Juanda Chance, MD REF. BY: PROCEDURE DATE:  03/13/2012 PROCEDURE:  Colonoscopy with biopsy ASA CLASS:  Class III INDICATIONS:  heme positive stool, Iron deficiency anemia, Abnormal CT of abdomen right colon mass on CT scan MEDICATIONS:   These medications were titrated to patient response per physician's verbal order, Versed 5 mg, Fentanyl 62.5 mcg  DESCRIPTION OF PROCEDURE:   After the risks and benefits and of the procedure were explained, informed consent was obtained. Digital rectal exam was performed and revealed no rectal masses. The Pentax Ped Colon [id number] endoscope was introduced through the anus and advanced to the cecum, which was identified by both the appendix and ileocecal valve.  The quality of the prep was good, using MoviPrep.  The instrument was then slowly withdrawn as the colon was fully examined. <<PROCEDUREIMAGES>>  FINDINGS:  A mass was found in the ascending colon. nonobstructing mass 1/3 of the colon circumference, friable, firm With standard forceps, biopsy was obtained and sent to pathology (see image8, image9, image10, image11, image12, and image13).  There were multiple polyps identified and removed. throughout the colon (see image14, image16, image18, image17, image1, image2, and image7). 8 sessile polyps ranging from 5 mm to 15 mm cecum, right colon and sigmoid NOT REMOVED  Mild diverticulosis was found in the sigmoid colon (see image6, image5, image4, and image19).   Retroflexed views in the rectum revealed no abnormalities.    The scope was then withdrawn from the patient and the procedure completed.  COMPLICATIONS:  None ENDOSCOPIC IMPRESSION: 1) Mass in the ascending colon 2) Polyps, multiple throughout the colon 3) Mild diverticulosis  in the sigmoid colon mass c/w carcinoma, non obstructing multiple medium size polyps not removed due to anticipated right hemicolectomy and him being on Plavix RECOMMENDATIONS: 1) Await biopsy results medical preop clearance hold Plavix, check CEA, discuss with daughter  REPEAT EXAM:  In 1 year(s) for.  ______________________________ Hedwig Morton. Juanda Chance, MD  CC:  n. eSIGNED:   Hedwig Morton. Degan Hanser at 03/13/2012 12:03 PM  [name], [phone]

## 2012-03-13 NOTE — Progress Notes (Signed)
Attempted to get pt to drink some more of his Moviprep this AM. Pt stated "No. I don't want any." Pt became tearful again, stating "I am tired of this. I don't want anymore medicine." Will pass this information onto day shift RN and will continue to monitor.

## 2012-03-13 NOTE — Interval H&P Note (Signed)
History and Physical Interval Note:  03/13/2012 11:31 AM  [name]  has presented today for surgery, with the diagnosis of heme positive anemia, mass in the colon  The various methods of treatment have been discussed with the patient and family. After consideration of risks, benefits and other options for treatment, the patient has consented to  Procedure(s) (LRB): COLONOSCOPY (N/A) as a surgical intervention .  The patient's history has been reviewed, patient examined, no change in status, stable for surgery.  I have reviewed the patients' chart and labs.  Questions were answered to the patient's satisfaction.     Lina Sar

## 2012-03-13 NOTE — Accreditation Note (Signed)
Encouraged pt to drink more of the bowel prep. Pt stated "not tonight". When asked if he would drink the rest in the morning, pt said "yes, I will try." Will continue to encourage pt to drink bowel prep in AM.

## 2012-03-14 ENCOUNTER — Encounter: Payer: Self-pay | Admitting: Internal Medicine

## 2012-03-14 ENCOUNTER — Encounter (HOSPITAL_COMMUNITY): Payer: Self-pay | Admitting: Internal Medicine

## 2012-03-14 DIAGNOSIS — R112 Nausea with vomiting, unspecified: Secondary | ICD-10-CM

## 2012-03-14 DIAGNOSIS — E1165 Type 2 diabetes mellitus with hyperglycemia: Secondary | ICD-10-CM

## 2012-03-14 DIAGNOSIS — R1032 Left lower quadrant pain: Secondary | ICD-10-CM

## 2012-03-14 DIAGNOSIS — E860 Dehydration: Secondary | ICD-10-CM

## 2012-03-14 LAB — BASIC METABOLIC PANEL
Calcium: 8.2 mg/dL — ABNORMAL LOW (ref 8.4–10.5)
GFR calc Af Amer: >90 mL/min (ref >90)
GFR calc non Af Amer: 83 mL/min — ABNORMAL LOW (ref >90)
Glucose, Bld: 136 mg/dL — ABNORMAL HIGH (ref 70–99)
Potassium: 3.4 mEq/L — ABNORMAL LOW (ref 3.5–5.1)
Sodium: 137 mEq/L (ref 135–145)

## 2012-03-14 LAB — CBC
Hemoglobin: 9.3 g/dL — ABNORMAL LOW (ref 13.0–17.0)
MCHC: 30.7 g/dL (ref 30.0–36.0)
Platelets: 225 10*3/uL (ref 150–400)

## 2012-03-14 LAB — GLUCOSE, CAPILLARY
Glucose-Capillary: 166 mg/dL — ABNORMAL HIGH (ref 70–99)
Glucose-Capillary: 201 mg/dL — ABNORMAL HIGH (ref 70–99)
Glucose-Capillary: 134 mg/dL — ABNORMAL HIGH (ref 70–99)

## 2012-03-14 MED ORDER — NITROGLYCERIN 0.2 MG/HR TD PT24
0.2000 mg | MEDICATED_PATCH | Freq: Every day | TRANSDERMAL | Status: DC
  Administered 2012-03-15 – 2012-03-21 (×7): 0.2 mg via TRANSDERMAL
  Filled 2012-03-14 (×7): qty 1

## 2012-03-14 MED ORDER — POTASSIUM CHLORIDE CRYS ER 20 MEQ PO TBCR
40.0000 meq | EXTENDED_RELEASE_TABLET | Freq: Once | ORAL | Status: DC
  Filled 2012-03-14: qty 2

## 2012-03-14 MED ORDER — LORAZEPAM 2 MG/ML IJ SOLN
0.5000 mg | INTRAMUSCULAR | Status: AC | PRN
Start: 2012-03-14 — End: 2012-03-19
  Administered 2012-03-14 – 2012-03-19 (×2): 0.5 mg via INTRAVENOUS
  Filled 2012-03-14 (×2): qty 1

## 2012-03-14 MED ORDER — METOPROLOL TARTRATE 12.5 MG HALF TABLET
12.5000 mg | ORAL_TABLET | Freq: Two times a day (BID) | ORAL | Status: DC
  Administered 2012-03-14 – 2012-03-15 (×2): 12.5 mg via ORAL
  Filled 2012-03-14 (×3): qty 1

## 2012-03-14 MED ORDER — LORAZEPAM 2 MG/ML IJ SOLN
INTRAMUSCULAR | Status: AC
  Administered 2012-03-14: 0.5 mg via INTRAVENOUS
  Filled 2012-03-14: qty 1

## 2012-03-14 NOTE — Progress Notes (Signed)
Post colonoscopy, ascending colon tumor, path report pending, likely adenocarcinoma. Surgery evaluation appreciated. Will sign off now, recall colonoscopy 6-12 months. Many polyps found throughout the colon, some of them will be removed with right hemicolectomy, but some will have to be snared via colonoscope.

## 2012-03-14 NOTE — Consult Note (Signed)
Reason for Consult: Cardiac clearance for right hemicolectomy Referring Physician: [facility] hospitalist  [name] is an 71 y.o. male.  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 Cardiologic consultation is called for surgical clearance. Patient presently awake in no acute distress no history could be obtained as patient is aphasic no family member is present at the bedside. I attempted to call his daughter but no success. Patient was started on Plavix after CVA which has been held in anticipation for surgery. Due to multiple risk factors and prior history of MI and CABG will schedule for nuclear stress testing prior to right hemicolectomy.  Past Medical History  Diagnosis Date  . Myocardial infarction   . CVA (cerebral infarction)   . Diabetes mellitus   . Hypertension   . Diabetes mellitus   . Aphasia   . Dysphagia   . Stroke   . COPD (chronic obstructive pulmonary disease)   . Shortness of breath   . Anemia     Past Surgical History  Procedure Date  . Coronary artery bypass graft   . Appendectomy   . Dental surgery   . Colonoscopy 03/13/2012    Procedure: COLONOSCOPY;  Surgeon: Hart Carwin, MD;  Location: [facility] ENDOSCOPY;  Service: Endoscopy;  Laterality: N/A;    Family History  Problem Relation Age of Onset  . Breast cancer Mother   . Heart disease Brother   . Colon cancer Maternal Uncle     Social History:  reports that he quit smoking about 2 months ago. His smoking use included Cigarettes. He quit after 16 years of use. He has never used smokeless tobacco.  He reports that he does not drink alcohol or use illicit drugs.  Allergies: No Known Allergies  Medications: I have reviewed the patient's current medications.  Results for orders placed during the hospital encounter of 03/11/12 (from the past 48 hour(s))  GLUCOSE, CAPILLARY     Status: Abnormal   Collection Time   03/12/12 10:29 PM      Component Value Range Comment   Glucose-Capillary 183 (*) 70 - 99 mg/dL    Comment 1 Notify RN     CBC     Status: Abnormal   Collection Time   03/13/12  5:10 AM      Component Value Range Comment   WBC 5.4  4.0 - 10.5 K/uL    RBC 3.75 (*) 4.22 - 5.81 MIL/uL    Hemoglobin 9.9 (*) 13.0 - 17.0 g/dL    HCT 47.8 (*) 29.5 - 52.0 %    MCV 81.1  78.0 - 100.0 fL    MCH 26.4  26.0 - 34.0 pg    MCHC 32.6  30.0 - 36.0 g/dL    RDW 62.1  30.8 - 65.7 %    Platelets 231  150 - 400 K/uL   BASIC METABOLIC PANEL     Status: Abnormal   Collection Time   03/13/12  5:10 AM      Component Value Range Comment   Sodium 136  135 - 145 mEq/L    Potassium 4.0  3.5 - 5.1 mEq/L  Chloride 106  96 - 112 mEq/L    CO2 19  19 - 32 mEq/L    Glucose, Bld 157 (*) 70 - 99 mg/dL    BUN 12  6 - 23 mg/dL    Creatinine, Ser 1.61  0.50 - 1.35 mg/dL    Calcium 8.6  8.4 - 09.6 mg/dL    GFR calc non Af Amer 89 (*) >90 mL/min    GFR calc Af Amer >90  >90 mL/min   GLUCOSE, CAPILLARY     Status: Abnormal   Collection Time   03/13/12  8:23 AM      Component Value Range Comment   Glucose-Capillary 198 (*) 70 - 99 mg/dL    Comment 1 Notify RN     GLUCOSE, CAPILLARY     Status: Abnormal   Collection Time   03/13/12  3:01 PM      Component Value Range Comment   Glucose-Capillary 141 (*) 70 - 99 mg/dL    Comment 1 Notify RN     GLUCOSE, CAPILLARY     Status: Normal   Collection Time   03/13/12  4:55 PM      Component Value Range Comment   Glucose-Capillary 74  70 - 99 mg/dL    Comment 1 Notify RN     GLUCOSE, CAPILLARY     Status: Abnormal   Collection Time   03/13/12 10:45 PM       Component Value Range Comment   Glucose-Capillary 157 (*) 70 - 99 mg/dL    Comment 1 Notify RN     CBC     Status: Abnormal   Collection Time   03/14/12  5:15 AM      Component Value Range Comment   WBC 7.4  4.0 - 10.5 K/uL    RBC 3.70 (*) 4.22 - 5.81 MIL/uL    Hemoglobin 9.3 (*) 13.0 - 17.0 g/dL    HCT 04.5 (*) 40.9 - 52.0 %    MCV 81.9  78.0 - 100.0 fL    MCH 25.1 (*) 26.0 - 34.0 pg    MCHC 30.7  30.0 - 36.0 g/dL    RDW 81.1  91.4 - 78.2 %    Platelets 225  150 - 400 K/uL   BASIC METABOLIC PANEL     Status: Abnormal   Collection Time   03/14/12  5:15 AM      Component Value Range Comment   Sodium 137  135 - 145 mEq/L    Potassium 3.4 (*) 3.5 - 5.1 mEq/L    Chloride 105  96 - 112 mEq/L    CO2 23  19 - 32 mEq/L    Glucose, Bld 136 (*) 70 - 99 mg/dL    BUN 13  6 - 23 mg/dL    Creatinine, Ser 9.56  0.50 - 1.35 mg/dL    Calcium 8.2 (*) 8.4 - 10.5 mg/dL    GFR calc non Af Amer 83 (*) >90 mL/min    GFR calc Af Amer >90  >90 mL/min   GLUCOSE, CAPILLARY     Status: Abnormal   Collection Time   03/14/12 11:40 AM      Component Value Range Comment   Glucose-Capillary 166 (*) 70 - 99 mg/dL    Comment 1 Notify RN     GLUCOSE, CAPILLARY     Status: Abnormal   Collection Time   03/14/12  5:15 PM      Component Value Range Comment   Glucose-Capillary 201 (*)  70 - 99 mg/dL    Comment 1 Notify RN       No results found.  Review of Systems  Unable to perform ROS  Blood pressure 122/62, pulse 80, temperature 97.8 F (36.6 C), temperature source Oral, resp. rate 22, height 5\' 4"  (1.626 m), weight 67.677 kg (149 lb 3.2 oz), SpO2 96.00%. Physical Exam  Constitutional: No distress.  HENT:  Head: Normocephalic and atraumatic.  Eyes: Conjunctivae are normal. Pupils are equal, round, and reactive to light. Left eye exhibits no discharge. No scleral icterus.  Neck: Normal range of motion. Neck supple. No JVD present. No tracheal deviation present. No thyromegaly present.  Cardiovascular:  Normal rate, regular rhythm and normal heart sounds.  Exam reveals no friction rub.   No murmur heard. Respiratory: Effort normal and breath sounds normal. No respiratory distress. He has no wheezes. He has no rales.  GI: Soft. Bowel sounds are normal. He exhibits no distension. There is no tenderness.  Musculoskeletal: He exhibits no edema and no tenderness.  Lymphadenopathy:    He has no cervical adenopathy.    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 Will discuss with family. Will add low-dose beta blockers and nitrates as per orders Kamoni Gentles N 03/14/2012, 8:30 PM

## 2012-03-14 NOTE — Progress Notes (Signed)
Patient was watching a [employer] game when I came in. Offered support. He declined needing it right now.  Friendly. He is very hard of hearing, but does seem to read lips well if you look right at him and speak slowly.  I had difficulty understanding him, but if I asked simple questions, he responded with one word responses that I could understand clearly.  Will follow up as necessary.  03/14/12 1300  Clinical Encounter Type  Visited With Patient  Visit Type Initial  Referral From Nurse  Recommendations Follow up  Spiritual Encounters  Spiritual Needs Emotional  Stress Factors  Patient Stress Factors Health changes

## 2012-03-14 NOTE — Progress Notes (Signed)
Vent trigeminy noted on pt's telemetry monitor. Vitals taken, which were stable. Pt asked if he felt ok, had any pain, or any sob. He denied any complaints of these and stated he" felt good."  Pvc's noted in pt's previous tele strips, as well as a run of vent trigeminy on 6/24.  MD on call notified, no new orders at this time. Pt currently resting comfortably, will continue to monitor.

## 2012-03-14 NOTE — Progress Notes (Signed)
Subjective: Pt crying intermittently, nods to the fact he talked with surgery about possilble cancer.  Objective: Vital signs in last 24 hours: Temp:  [97.8 F (36.6 [name])-98.7 F (37.1 [name])] 98 F (36.7 [name]) (06/27 0700) Pulse Rate:  [73-86] 73  (06/27 0700) Resp:  [20] 20  (06/27 0700) BP: (99-150)/(65-74) 110/74 mmHg (06/27 0700) SpO2:  [96 %-98 %] 96 % (06/27 0700) Weight change:  Last BM Date: 03/13/12  Intake/Output from previous day: 06/26 0701 - 06/27 0700 In: 4172.3 [I.V.:2972.3; IV Piggyback:1200] Out: 1900 [Urine:1900] Total I/O In: 480 [P.O.:480] Out: -    Physical Exam: General: Comfortable.  Alert, communicative,difficult to understand 2/2 dysarthria/expressive dysphasia, not short of breath at rest.  HEENT: Moderate clinical pallor, no jaundice, no conjunctival injection or discharge. Hydration status appears fair.  NECK: Supple, JVP not seen, no carotid bruits, no palpable lymphadenopathy, no palpable goiter.  CHEST: Clinically clear to auscultation, no wheezes, no crackles. Mid-line sternotomy scar is noted.  HEART: Sounds 1 and 2 heard, normal, regular, no murmurs.  ABDOMEN: Full, soft, mildly tender in epigastrium, mid abdomen and LLQ. no palpable organomegaly, no palpable masses. Bowel sounds are heard.  LOWER EXTREMITIES: No pitting edema, palpable peripheral pulses.  CENTRAL NERVOUS SYSTEM: Dysarthria, expressive dysphasia, mild RUE weakness. .  Lab Results:  Basename 03/14/12 0515 03/13/12 0510  WBC 7.4 5.4  HGB 9.3* 9.9*  HCT 30.3* 30.4*  PLT 225 231    Basename 03/14/12 0515 03/13/12 0510  NA 137 136  K 3.4* 4.0  CL 105 106  CO2 23 19  GLUCOSE 136* 157*  BUN 13 12  CREATININE 0.93 0.78  CALCIUM 8.2* 8.6   Recent Results (from the past 240 hour(s))  CULTURE, BLOOD (ROUTINE X 2)     Status: Normal (Preliminary result)   Collection Time   03/11/12  5:55 PM      Component Value Range Status Comment   Specimen Description BLOOD RIGHT ARM   Final     Special Requests BOTTLES DRAWN AEROBIC AND ANAEROBIC 10CC   Final    Culture  Setup Time [id number]   Final    Culture     Final    Value:        BLOOD CULTURE RECEIVED NO GROWTH TO DATE CULTURE WILL BE HELD FOR 5 DAYS BEFORE ISSUING A FINAL NEGATIVE REPORT   Report Status PENDING   Incomplete   CULTURE, BLOOD (ROUTINE X 2)     Status: Normal (Preliminary result)   Collection Time   03/11/12  6:05 PM      Component Value Range Status Comment   Specimen Description BLOOD RIGHT HAND   Final    Special Requests BOTTLES DRAWN AEROBIC AND ANAEROBIC 10CC   Final    Culture  Setup Time [id number]   Final    Culture     Final    Value:        BLOOD CULTURE RECEIVED NO GROWTH TO DATE CULTURE WILL BE HELD FOR 5 DAYS BEFORE ISSUING A FINAL NEGATIVE REPORT   Report Status PENDING   Incomplete      Studies/Results: No results found.  Medications: Scheduled Meds:    . aspirin EC  81 mg Oral Daily  . ciprofloxacin  400 mg Intravenous Q12H  . ezetimibe-simvastatin  1 tablet Oral QHS  . feeding supplement  1 Container Oral BID BM  . insulin aspart  0-5 Units Subcutaneous QHS  . insulin aspart  0-9 Units Subcutaneous TID WC  .  LORazepam  2 mg Oral QHS  . metronidazole  500 mg Intravenous Q8H  . multivitamin with minerals  1 tablet Oral Daily  . sodium chloride  3 mL Intravenous Q12H  . vitamin [name]  500 mg Oral BID  . zinc sulfate  220 mg Oral Daily   Continuous Infusions:    . sodium chloride 75 mL/hr at 03/13/12 1947   PRN Meds:.acetaminophen, acetaminophen, albuterol, HYDROmorphone (DILAUDID) injection, ipratropium, LORazepam, nitroGLYCERIN, ondansetron (ZOFRAN) IV, ondansetron, oxyCODONE  Assessment/Plan:  Active Problems:  1. Abdominal pain/Colonic mass: Patient presents with approximately one week of altered bowel habit/constipation, left lower quadrant abdominal pain, and a low grade pyrexia>was empirically treated with amoxicillin. Ciprofloxacin/Flagyl started for poss.  Diverticulitis on admit.  Abdominal/pelvic CT scan showed findings most consistent with adenocarcinoma of the cecum/ascending colon, 22 x 46 x 23 mm soft tissue mass. Small but conspicuous mesenteric lymph node nearby measuring 5 mm short axis. No liver or distant metastatic disease identified in the abdomen pelvis.  Dr Lina Sar saw pt, and colonoscopy was done 6/26 - mass in ascending colon. Afterwards, patient will need surgical consult, and CEA  Of 6/25 3.2.  -surgery consulted - I have consulted Dr Particia Jasper for clearance- pt and family do not remember which cardiologist he saw in the past -PT OT cosult and follow 2. Dehydration: Patient had elevated BUN/Creatinine ratio, consistent with dehydration, on presentation. This has now resolved with iv fluids.  3. Anemia, Fe def: Patient has a normocytic anemia, which appeared mild at presentation at 9.4, against a background of dehydration and hemoconcentration. With iv fluids, overnight hemoglobin has dropped to 7.1 and he was transfused -hgb improved, stable, s/p transfusion 4. CAD (coronary artery disease): Patient has a known history of CAD, s/p MI,s/p CABG. He appears stable and asymptomatic from this viewpoint.   5. HTN (hypertension): BP now stable, ss/p vol resuscitation.  6. COPD (chronic obstructive pulmonary disease): Stable/aymptomatic.  7. H/O: CVA (cerebrovascular accident): Stable.  8. Diabetes mellitus: This is insulin-requiring type 2,  -continue ssi 9.Hypokalemia -replace K   LOS: 3 days   [name] 03/14/2012, 1:14 PM

## 2012-03-14 NOTE — Progress Notes (Signed)
1 Day Post-Op  Subjective: No complaints, he got tearful when I told him we were concerned he might have a cancer.  No family in room, nurse has not seen them here today  Objective: Vital signs in last 24 hours: Temp:  [97.8 F (36.6 C)-98.7 F (37.1 C)] 98 F (36.7 C) (06/27 0700) Pulse Rate:  [73-86] 73  (06/27 0700) Resp:  [20] 20  (06/27 0700) BP: (99-150)/(65-74) 110/74 mmHg (06/27 0700) SpO2:  [96 %-98 %] 96 % (06/27 0700) Last BM Date: 03/13/12  Intake/Output from previous day: 06/26 0701 - 06/27 0700 In: 4172.3 [I.V.:2972.3; IV Piggyback:1200] Out: 1900 [Urine:1900] Intake/Output this shift: Total I/O In: 480 [P.O.:480] Out: -   General appearance: alert, cooperative and speech is very slurred and difficult to understand. Resp: clear to auscultation bilaterally GI: soft, non-tender; bowel sounds normal; no masses,  no organomegaly  Lab Results:   Basename 03/14/12 0515 03/13/12 0510  WBC 7.4 5.4  HGB 9.3* 9.9*  HCT 30.3* 30.4*  PLT 225 231    BMET  Basename 03/14/12 0515 03/13/12 0510  NA 137 136  K 3.4* 4.0  CL 105 106  CO2 23 19  GLUCOSE 136* 157*  BUN 13 12  CREATININE 0.93 0.78  CALCIUM 8.2* 8.6   PT/INR No results found for this basename: LABPROT:2,INR:2 in the last 72 hours   Lab 03/12/12 0436  AST 20  ALT 10  ALKPHOS 65  BILITOT 0.2*  PROT 5.9*  ALBUMIN 3.0*     Lipase  No results found for this basename: lipase     Studies/Results: No results found.  Medications:    . aspirin EC  81 mg Oral Daily  . ciprofloxacin  400 mg Intravenous Q12H  . ezetimibe-simvastatin  1 tablet Oral QHS  . feeding supplement  1 Container Oral BID BM  . insulin aspart  0-5 Units Subcutaneous QHS  . insulin aspart  0-9 Units Subcutaneous TID WC  . LORazepam  2 mg Oral QHS  . metronidazole  500 mg Intravenous Q8H  . multivitamin with minerals  1 tablet Oral Daily  . sodium chloride  3 mL Intravenous Q12H  . vitamin C  500 mg Oral BID  . zinc  sulfate  220 mg Oral Daily    Assessment/Plan Ascending colon mass, bx pending Dehydration Anemia CAD,hx of MI/CABG on Plavix AODM TYPE II  COPD Stroke, with ongoing dysarthria, expressive dysphasia, and RUE weakness Dyslipidemia  Plan:  We need to get the medical service and cardiology to clear him for surgery if he does in fact have cancer.  It would help to have PT and OT see him and see how mobile he is before surgery so we can know what to expect post op if he has surgery.  Will defer to Medicine.   LOS: 3 days    Teanna Elem 03/14/2012

## 2012-03-15 DIAGNOSIS — E1165 Type 2 diabetes mellitus with hyperglycemia: Secondary | ICD-10-CM

## 2012-03-15 DIAGNOSIS — R112 Nausea with vomiting, unspecified: Secondary | ICD-10-CM

## 2012-03-15 DIAGNOSIS — E860 Dehydration: Secondary | ICD-10-CM

## 2012-03-15 DIAGNOSIS — R1032 Left lower quadrant pain: Secondary | ICD-10-CM

## 2012-03-15 LAB — BASIC METABOLIC PANEL
CO2: 23 mEq/L (ref 19–32)
Calcium: 8.3 mg/dL — ABNORMAL LOW (ref 8.4–10.5)
Chloride: 108 mEq/L (ref 96–112)
Potassium: 3.7 mEq/L (ref 3.5–5.1)
Sodium: 137 mEq/L (ref 135–145)

## 2012-03-15 LAB — GLUCOSE, CAPILLARY
Glucose-Capillary: 144 mg/dL — ABNORMAL HIGH (ref 70–99)
Glucose-Capillary: 166 mg/dL — ABNORMAL HIGH (ref 70–99)

## 2012-03-15 MED ORDER — SODIUM CHLORIDE 0.9 % IV BOLUS (SEPSIS)
500.0000 mL | Freq: Once | INTRAVENOUS | Status: AC
  Administered 2012-03-15: 500 mL via INTRAVENOUS

## 2012-03-15 MED ORDER — SODIUM CHLORIDE 0.9 % IV BOLUS (SEPSIS): 250.0000 mL | Freq: Once | INTRAVENOUS | Status: DC

## 2012-03-15 NOTE — Progress Notes (Signed)
Subjective: Remains dysarthric, states decreased abd pain, asking when surgery is planned.no gross bleeding. Objective: Vital signs in last 24 hours: Temp:  [98.2 F (36.8 C)-98.5 F (36.9 C)] 98.4 F (36.9 C) (06/28 1404) Pulse Rate:  [64-83] 68  (06/28 1404) Resp:  [18-20] 20  (06/28 1404) BP: (85-153)/(53-65) 85/53 mmHg (06/28 1404) SpO2:  [97 %-98 %] 97 % (06/28 1404) Weight change:  Last BM Date: 03/15/12  Intake/Output from previous day: 06/27 0701 - 06/28 0700 In: 600 [P.O.:600] Out: 2450 [Urine:2450]     Physical Exam: General: Comfortable.  Alert, communicative,difficult to understand 2/2 dysarthria/expressive dysphasia, not short of breath at rest.  HEENT: Moderate clinical pallor, no jaundice, no conjunctival injection or discharge. Hydration status appears fair.  NECK: Supple, JVP not seen, no carotid bruits, no palpable lymphadenopathy, no palpable goiter.  CHEST: Clinically clear to auscultation, no wheezes, no crackles. Mid-line sternotomy scar is noted.  HEART: Sounds 1 and 2 heard, normal, regular, no murmurs.  ABDOMEN: Full, soft, mildly tender in epigastrium, mid abdomen and LLQ. no palpable organomegaly, no palpable masses. Bowel sounds are heard.  LOWER EXTREMITIES: No pitting edema, palpable peripheral pulses.  CENTRAL NERVOUS SYSTEM: Dysarthria, expressive dysphasia, mild RUE weakness. .  Lab Results:  Basename 03/14/12 0515 03/13/12 0510  WBC 7.4 5.4  HGB 9.3* 9.9*  HCT 30.3* 30.4*  PLT 225 231    Basename 03/15/12 0501 03/14/12 0515  NA 137 137  K 3.7 3.4*  CL 108 105  CO2 23 23  GLUCOSE 146* 136*  BUN 13 13  CREATININE 0.89 0.93  CALCIUM 8.3* 8.2*   Recent Results (from the past 240 hour(s))  CULTURE, BLOOD (ROUTINE X 2)     Status: Normal (Preliminary result)   Collection Time   03/11/12  5:55 PM      Component Value Range Status Comment   Specimen Description BLOOD RIGHT ARM   Final    Special Requests BOTTLES DRAWN AEROBIC AND  ANAEROBIC 10CC   Final    Culture  Setup Time [id number]   Final    Culture     Final    Value:        BLOOD CULTURE RECEIVED NO GROWTH TO DATE CULTURE WILL BE HELD FOR 5 DAYS BEFORE ISSUING A FINAL NEGATIVE REPORT   Report Status PENDING   Incomplete   CULTURE, BLOOD (ROUTINE X 2)     Status: Normal (Preliminary result)   Collection Time   03/11/12  6:05 PM      Component Value Range Status Comment   Specimen Description BLOOD RIGHT HAND   Final    Special Requests BOTTLES DRAWN AEROBIC AND ANAEROBIC 10CC   Final    Culture  Setup Time [id number]   Final    Culture     Final    Value:        BLOOD CULTURE RECEIVED NO GROWTH TO DATE CULTURE WILL BE HELD FOR 5 DAYS BEFORE ISSUING A FINAL NEGATIVE REPORT   Report Status PENDING   Incomplete      Studies/Results: No results found.  Medications: Scheduled Meds:    . aspirin EC  81 mg Oral Daily  . ciprofloxacin  400 mg Intravenous Q12H  . ezetimibe-simvastatin  1 tablet Oral QHS  . feeding supplement  1 Container Oral BID BM  . insulin aspart  0-5 Units Subcutaneous QHS  . insulin aspart  0-9 Units Subcutaneous TID WC  . LORazepam  2 mg Oral QHS  . metronidazole  500 mg Intravenous Q8H  . multivitamin with minerals  1 tablet Oral Daily  . nitroGLYCERIN  0.2 mg Transdermal Daily  . potassium chloride  40 mEq Oral Once  . sodium chloride  250 mL Intravenous Once  . sodium chloride  500 mL Intravenous Once  . sodium chloride  3 mL Intravenous Q12H  . vitamin C  500 mg Oral BID  . zinc sulfate  220 mg Oral Daily  . DISCONTD: metoprolol tartrate  12.5 mg Oral BID   Continuous Infusions:    . sodium chloride 75 mL/hr at 03/13/12 1947   PRN Meds:.acetaminophen, acetaminophen, albuterol, HYDROmorphone (DILAUDID) injection, ipratropium, LORazepam, nitroGLYCERIN, ondansetron (ZOFRAN) IV, ondansetron, oxyCODONE  Assessment/Plan:  Active Problems:  1. Abdominal pain/Colonic mass: Patient presents with approximately one week of  altered bowel habit/constipation, left lower quadrant abdominal pain, and a low grade pyrexia>was empirically treated with amoxicillin. Ciprofloxacin/Flagyl started for poss. Diverticulitis on admit.  Abdominal/pelvic CT scan showed findings most consistent with adenocarcinoma of the cecum/ascending colon, 22 x 46 x 23 mm soft tissue mass. Small but conspicuous mesenteric lymph node nearby measuring 5 mm short axis. No liver or distant metastatic disease identified in the abdomen pelvis.  Dr Lina Sar saw pt, and colonoscopy was done 6/26 - mass in ascending colon. Afterwards, patient will need surgical consult, and CEA  Of 6/25 3.2.  -surgery consulted - I have consulted Dr Particia Jasper for clearance- pt and family do not remember which cardiologist he saw in the past -PT OT cosult and follow 2. Dehydration: Patient had elevated BUN/Creatinine ratio, consistent with dehydration, on presentation. This has now resolved with iv fluids.  3. Anemia, Fe def: Patient has a normocytic anemia, which appeared mild at presentation at 9.4, against a background of dehydration and hemoconcentration. With iv fluids, overnight hemoglobin  dropped to 7.1 and he was transfused -hgb improved, stable, s/p transfusion, follow and reheck 4. CAD (coronary artery disease): Patient has a known history of CAD, s/p MI,s/p CABG. He appears stable, chest pain free    -appreciate cardiology assistance, lexiscan myoview in am  For preop clearance - plavix on hold, continue B blockers  5. HTN (hypertension): BP meds on hold 2/2 hoypotension  6. COPD (chronic obstructive pulmonary disease): Stable/aymptomatic.  7. H/O: CVA (cerebrovascular accident): Stable.  8. Diabetes mellitus: This is insulin-requiring type 2,  -continue ssi 9.Hypokalemia -replace K 10.Hypotension -antihypertensives held, fluid bolus. -follow and recheck hgb   LOS: 4 days   [name] C 03/15/2012, 8:50 PM

## 2012-03-15 NOTE — Evaluation (Addendum)
Occupational Therapy Evaluation Patient Details Name: [name] MRN: [id number] DOB: 1941/05/09 Today's Date: 03/15/2012 Time: 7829-5621 OT Time Calculation (min): 21 min  OT Assessment / Plan / Recommendation Clinical Impression  71 yo male admitted with abdominal pain and new diagnosis of mass on colon. Pt has expressive aphasia-difficulty providing PLOF/home environment. No family present during evaluation. Unsure of baseline level of function although pt attempted to express that he did not need much assist at home. Recommend HHPT with 24 hour supervison vs ST rehab SNF, depending on progress.Pt may possibly have surgery at later date and mobility status may change.     OT Assessment  Patient needs continued OT Services    Follow Up Recommendations  Home health OT with 24/7 assist vs Skilled nursing facility --depending on progress and assist available at discharge. Pt may be having surgery in near future and depends on functional status after surgery.   Barriers to Discharge      Equipment Recommendations  Other (comment) (to be determined)    Recommendations for Other Services    Frequency  Min 2X/week    Precautions / Restrictions Precautions Precautions: Fall Restrictions Weight Bearing Restrictions: No        ADL  Grooming: Simulated;Wash/dry face;Min guard Where Assessed - Grooming: Unsupported sitting Upper Body Bathing: Simulated;Chest;Right arm;Left arm;Abdomen;Minimal assistance Where Assessed - Upper Body Bathing: Unsupported sitting Lower Body Bathing: Simulated;Minimal assistance;Moderate assistance Where Assessed - Lower Body Bathing: Supported sit to stand Upper Body Dressing: Simulated;Minimal assistance Where Assessed - Upper Body Dressing: Unsupported sitting Lower Body Dressing: Simulated;Minimal assistance;Moderate assistance Where Assessed - Lower Body Dressing: Supported sit to Pharmacist, hospital: Simulated;Moderate assistance;Other  (comment) (for balance. pt unsteady) Toilet Transfer Method: Other (comment) (ambulating) Toileting - Clothing Manipulation and Hygiene: Simulated;Minimal assistance;Moderate assistance Where Assessed - Toileting Clothing Manipulation and Hygiene: Standing Tub/Shower Transfer Method: Not assessed ADL Comments: cotx with PT. Ambulated in hallway and pt was unsteady. Pt seemed to indicate some dizziness but dificult to determine as pt has expressive aphasia. Did note pt squinting eyes a few times. Returned pt to bed. Pt may be pending surgery in near future. Unsure of PLOF. Unclear if pt needed assist with ADL.    OT Diagnosis: Generalized weakness  OT Problem List: Decreased strength;Impaired balance (sitting and/or standing);Decreased knowledge of use of DME or AE;Decreased activity tolerance OT Treatment Interventions: Self-care/ADL training;Therapeutic activities;DME and/or AE instruction;Patient/family education   OT Goals Acute Rehab OT Goals OT Goal Formulation: Patient unable to participate in goal setting Time For Goal Achievement: 03/29/12 Potential to Achieve Goals: Good ADL Goals Pt Will Perform Grooming: with supervision;Standing at sink ADL Goal: Grooming - Progress: Goal set today Pt Will Perform Upper Body Bathing: with set-up;Sitting, edge of bed;Sitting, chair ADL Goal: Upper Body Bathing - Progress: Goal set today Pt Will Perform Lower Body Bathing: with supervision;Sit to stand from chair;Sit to stand from bed ADL Goal: Lower Body Bathing - Progress: Goal set today Pt Will Perform Upper Body Dressing: with set-up;Sitting, chair;Sitting, bed;Unsupported ADL Goal: Upper Body Dressing - Progress: Goal set today Pt Will Perform Lower Body Dressing: with supervision;Sit to stand from bed;Sit to stand from chair ADL Goal: Lower Body Dressing - Progress: Goal set today Pt Will Transfer to Toilet: with supervision;with DME;Ambulation;3-in-1 ADL Goal: Toilet Transfer - Progress:  Goal set today Pt Will Perform Toileting - Clothing Manipulation: with supervision;Standing ADL Goal: Toileting - Clothing Manipulation - Progress: Goal set today  Visit Information  Last OT Received On:  03/15/12 Assistance Needed: +1 PT/OT Co-Evaluation/Treatment: Yes    Subjective Data  Subjective: pt acknowledges PT/OT. difficult to understand speech Patient Stated Goal: agreeable to work with PT/OT   Prior Functioning  Home Living Lives With: Spouse Available Help at Discharge: Family Additional Comments: No family present to give info. Pt has expressive aphasia.  Prior Function Comments: No family present to give info. Pt has expressive aphasia. Pt reported he did not need any assistance with ADLs, bed mobility. Unsure of assist level for ambulation, although pt shook head yes when asked if he walked at home. Communication Communication: Expressive difficulties    Cognition  Overall Cognitive Status: Difficult to assess Difficult to assess due to: Impaired communication Arousal/Alertness: Awake/alert Orientation Level: Person Behavior During Session: [facility] for tasks performed    Extremity/Trunk Assessment Right Upper Extremity Assessment RUE ROM/Strength/Tone: Deficits RUE ROM/Strength/Tone Deficits: ROM grossly WFL and strength grossly 4/5 throughout. Grip fair.  RUE Sensation: WFL - Light Touch RUE Coordination: Deficits RUE Coordination Deficits: noted when pt tried to use R UE to help don sock, he has some decreased coordination in UE.Also decreased grip strength Left Upper Extremity Assessment LUE ROM/Strength/Tone: WFL for tasks assessed LUE Sensation: WFL - Light Touch LUE Coordination: WFL - gross/fine motor Right Lower Extremity Assessment RLE ROM/Strength/Tone: Deficits RLE ROM/Strength/Tone Deficits: Strength 3+/5 throughout except DF/PF not tested RLE Sensation: WFL - Light Touch Left Lower Extremity Assessment LLE ROM/Strength/Tone: WFL for tasks  assessed LLE Sensation: WFL - Light Touch LLE Coordination: WFL - gross motor   Mobility Bed Mobility Bed Mobility: Supine to Sit;Sit to Supine Supine to Sit: 5: Supervision Sit to Supine: 5: Supervision Transfers Sit to Stand: 4: Min assist;From bed;From chair/3-in-1;With upper extremity assist;With armrests Stand to Sit: 4: Min assist;To bed;To chair/3-in-1;With armrests Details for Transfer Assistance: VCs safety, technique, hand placement. Assist to rise, stabilize, control descent.    Exercise    Balance Balance Balance Assessed: Yes Static Sitting Balance Static Sitting - Balance Support: Feet supported;Bilateral upper extremity supported Static Sitting - Level of Assistance: 5: Stand by assistance Static Standing Balance Static Standing - Level of Assistance: 4: Min assist Dynamic Standing Balance Dynamic Standing - Balance Support: Left upper extremity supported Dynamic Standing - Level of Assistance: 3: Mod assist  End of Session OT - End of Session Equipment Utilized During Treatment: Gait belt Activity Tolerance: Other (comment) (? dizziness) Patient left: in bed;with call bell/phone within reach;with bed alarm set  GO     Lennox Laity [phone] 03/15/2012, 12:46 PM

## 2012-03-15 NOTE — Progress Notes (Signed)
Subjective:  Patient is awake in no acute distress. Unable to communicate. Unable to reach family. We'll schedule for [employer] tomorrow  Objective:  Vital Signs in the last 24 hours: Temp:  [98.2 F (36.8 C)-98.5 F (36.9 C)] 98.4 F (36.9 C) (06/28 1404) Pulse Rate:  [64-83] 68  (06/28 1404) Resp:  [18-20] 20  (06/28 1404) BP: (85-153)/(53-65) 85/53 mmHg (06/28 1404) SpO2:  [97 %-98 %] 97 % (06/28 1404)  Intake/Output from previous day: 06/27 0701 - 06/28 0700 In: 600 [P.O.:600] Out: 2450 [Urine:2450] Intake/Output from this shift: Total I/O In: 240 [P.O.:240] Out: 450 [Urine:450]  Physical Exam: Neck: no adenopathy, no carotid bruit, no JVD and supple, symmetrical, trachea midline Lungs: clear to auscultation bilaterally Heart: regular rate and rhythm, S1, S2 normal, no murmur, click, rub or gallop Abdomen: soft, non-tender; bowel sounds normal; no masses,  no organomegaly Extremities: extremities normal, atraumatic, no cyanosis or edema  Lab Results:  Basename 03/14/12 0515 03/13/12 0510  WBC 7.4 5.4  HGB 9.3* 9.9*  PLT 225 231    Basename 03/15/12 0501 03/14/12 0515  NA 137 137  K 3.7 3.4*  CL 108 105  CO2 23 23  GLUCOSE 146* 136*  BUN 13 13  CREATININE 0.89 0.93   No results found for this basename: TROPONINI:2,CK,MB:2 in the last 72 hours Hepatic Function Panel No results found for this basename: PROT,ALBUMIN,AST,ALT,ALKPHOS,BILITOT,BILIDIR,IBILI in the last 72 hours No results found for this basename: CHOL in the last 72 hours No results found for this basename: PROTIME in the last 72 hours  Imaging: Imaging results have been reviewed and No results found.  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  LOS: 4 days    [name] N 03/15/2012, 5:36 PM

## 2012-03-15 NOTE — Progress Notes (Signed)
Patient BP 85/53. MD notified. Order given for NS bolus

## 2012-03-15 NOTE — Evaluation (Signed)
Physical Therapy Evaluation Patient Details Name: [name] MRN: [id number] DOB: 1941/08/05 Today's Date: 03/15/2012 Time: 7829-5621 PT Time Calculation (min): 21 min  PT Assessment / Plan / Recommendation Clinical Impression  71 yo male admitted with abdominal pain and new diagnosis of mass on colon. Pt has expressive aphasia-difficulty providing PLOF/home environment. No family present during evaluation. Unsure of baseline level of function although pt attempted to express that he did not need much assist at home. Recommend HHPT with 24 hour supervison vs ST rehab SNF, depending on progress.Pt may possibly have surgery at later date and mobility status may change.    PT Assessment  Patient needs continued PT services    Follow Up Recommendations  Home health PT;Supervision/Assistance - 24 hour vs Skilled nursing facility (depending on progress)   Barriers to Discharge        Equipment Recommendations   (to be determined)    Recommendations for Other Services OT consult   Frequency Min 3X/week    Precautions / Restrictions Precautions Precautions: Fall Restrictions Weight Bearing Restrictions: No   Pertinent Vitals/Pain       Mobility  Bed Mobility Bed Mobility: Supine to Sit;Sit to Supine Supine to Sit: 5: Supervision Sit to Supine: 5: Supervision Transfers Transfers: Sit to Stand;Stand to Sit Sit to Stand: 4: Min assist;From bed;From chair/3-in-1;With upper extremity assist;With armrests Stand to Sit: 4: Min assist;To bed;To chair/3-in-1;With armrests Details for Transfer Assistance: VCs safety, technique, hand placement. Assist to rise, stabilize, control descent.  Ambulation/Gait Ambulation/Gait Assistance: 3: Mod assist Ambulation Distance (Feet): 35 Feet Assistive device: 1 person hand held assist Ambulation/Gait Assistance Details: VCs safety, technique. Pt leaning to R side. Very unsteady. ? lightheadedness/dizziness with ambulation. Followed with  recliner. Assist to maintain stability throughout ambulation and to prevent LOB. Gait Pattern: Narrow base of support;Step-through pattern;Decreased weight shift to left    Exercises     PT Diagnosis: Difficulty walking;Abnormality of gait  PT Problem List: Decreased strength;Decreased activity tolerance;Decreased balance;Decreased mobility;Decreased knowledge of use of DME PT Treatment Interventions: DME instruction;Gait training;Functional mobility training;Therapeutic activities;Therapeutic exercise;Balance training;Patient/family education   PT Goals Acute Rehab PT Goals PT Goal Formulation: Patient unable to participate in goal setting Time For Goal Achievement: 03/29/12 Potential to Achieve Goals: Good Pt will go Supine/Side to Sit: with supervision PT Goal: Supine/Side to Sit - Progress: Goal set today Pt will go Sit to Supine/Side: with supervision PT Goal: Sit to Supine/Side - Progress: Goal set today Pt will go Sit to Stand: with supervision PT Goal: Sit to Stand - Progress: Goal set today Pt will Ambulate: with supervision;51 - 150 feet;with least restrictive assistive device PT Goal: Ambulate - Progress: Goal set today  Visit Information  Last PT Received On: 03/15/12 Assistance Needed: +1    Subjective Data  Subjective: Pt has expressive aphasia. Patient Stated Goal: None stated due to difficulty communicating   Prior Functioning  Home Living Lives With: Spouse Available Help at Discharge: Family Additional Comments: No family present to give info. Pt has expressive aphasia.  Prior Function Comments: No family present to give info. Pt has expressive aphasia. Pt reported he did not need any assistance with ADLs, bed mobility. Unsure of assist level for ambulation, although pt shook head yes when asked if he walked at home. Communication Communication: Expressive difficulties    Cognition  Overall Cognitive Status: Difficult to assess Difficult to assess due to:  Impaired communication Arousal/Alertness: Awake/alert Orientation Level: Person Behavior During Session: [facility] for tasks performed  Extremity/Trunk Assessment Right Lower Extremity Assessment RLE ROM/Strength/Tone: Deficits RLE ROM/Strength/Tone Deficits: Strength 3+/5 throughout except DF/PF not tested RLE Sensation: WFL - Light Touch Left Lower Extremity Assessment LLE ROM/Strength/Tone: WFL for tasks assessed LLE Sensation: WFL - Light Touch LLE Coordination: WFL - gross motor   Balance Balance Balance Assessed: Yes Static Sitting Balance Static Sitting - Balance Support: Feet supported;Bilateral upper extremity supported Static Sitting - Level of Assistance: 5: Stand by assistance Static Standing Balance Static Standing - Level of Assistance: 4: Min assist Dynamic Standing Balance Dynamic Standing - Balance Support: Left upper extremity supported Dynamic Standing - Level of Assistance: 3: Mod assist  End of Session PT - End of Session Equipment Utilized During Treatment: Gait belt Activity Tolerance: Patient tolerated treatment well Patient left: in bed;with call bell/phone within reach;with bed alarm set Nurse Communication: Mobility status;Precautions  GP     Rebeca Alert [facility] 03/15/2012, 11:49 AM [phone]

## 2012-03-15 NOTE — Progress Notes (Signed)
2 Days Post-Op  Subjective: Dysarthric, when i  Ask him how he feels, not much response, but when i touch his chest on the left, and his abdomen he moans some.  Objective: Vital signs in last 24 hours: Temp:  [97.8 F (36.6 C)-98.5 F (36.9 C)] 98.2 F (36.8 C) (06/28 0542) Pulse Rate:  [64-83] 64  (06/28 0542) Resp:  [18-22] 18  (06/28 0542) BP: (89-153)/(59-65) 89/59 mmHg (06/28 0542) SpO2:  [96 %-98 %] 98 % (06/28 0542) Last BM Date: 03/13/12  BP down some this AM 0500, HR down to 64  Intake/Output from previous day: 06/27 0701 - 06/28 0700 In: 600 [P.O.:600] Out: 2450 [Urine:2450] Intake/Output this shift: Total I/O In: 240 [P.O.:240] Out: 450 [Urine:450]  General appearance: alert, cooperative and no distress Resp: clear to auscultation bilaterally, he acts like he's tender when you palpate left chest. GI: soft, appears abit tender, but cant really pinpoint anything.  Lab Results:   Basename 03/14/12 0515 03/13/12 0510  WBC 7.4 5.4  HGB 9.3* 9.9*  HCT 30.3* 30.4*  PLT 225 231    BMET  Basename 03/15/12 0501 03/14/12 0515  NA 137 137  K 3.7 3.4*  CL 108 105  CO2 23 23  GLUCOSE 146* 136*  BUN 13 13  CREATININE 0.89 0.93  CALCIUM 8.3* 8.2*   PT/INR No results found for this basename: LABPROT:2,INR:2 in the last 72 hours   Lab 03/12/12 0436  AST 20  ALT 10  ALKPHOS 65  BILITOT 0.2*  PROT 5.9*  ALBUMIN 3.0*     Lipase  No results found for this basename: lipase     Studies/Results: No results found.  Medications:    . aspirin EC  81 mg Oral Daily  . ciprofloxacin  400 mg Intravenous Q12H  . ezetimibe-simvastatin  1 tablet Oral QHS  . feeding supplement  1 Container Oral BID BM  . insulin aspart  0-5 Units Subcutaneous QHS  . insulin aspart  0-9 Units Subcutaneous TID WC  . LORazepam  2 mg Oral QHS  . metoprolol tartrate  12.5 mg Oral BID  . metronidazole  500 mg Intravenous Q8H  . multivitamin with minerals  1 tablet Oral Daily  .  nitroGLYCERIN  0.2 mg Transdermal Daily  . potassium chloride  40 mEq Oral Once  . sodium chloride  250 mL Intravenous Once  . sodium chloride  3 mL Intravenous Q12H  . vitamin C  500 mg Oral BID  . zinc sulfate  220 mg Oral Daily    Assessment/Plan Ascending colon mass, bx: Adenocarcinoma Dehydration  Anemia  CAD,hx of MI/CABG on Plavix / Dr. Sharyn Lull is scheduling a Lexiscan Myoview, and adding low does BB. AODM TYPE II  COPD  Stroke, with ongoing dysarthria, expressive dysphasia, and RUE weakness  Dyslipidemia   Plan:  He will need to have cardiac and medical clearance which is is progress.  He will need a bowel prep, he's back on carb modified diet, and someone needs to talk with his family.  I have not seen any here the last 2 days when I rounded.  LOS: 4 days    Custer Pimenta 03/15/2012

## 2012-03-15 NOTE — Progress Notes (Signed)
He should be off plavix

## 2012-03-15 NOTE — Progress Notes (Signed)
Clinical Social Work Department BRIEF PSYCHOSOCIAL ASSESSMENT 03/15/2012  Patient:  [name], [name]     Account Number:  [id number]     Admit date:  03/11/2012  Clinical Social Worker:  Doroteo Glassman  Date/Time:  03/15/2012 10:47 AM  Referred by:  Physician  Date Referred:  03/15/2012 Referred for  SNF Placement   Other Referral:   Interview type:  Patient Other interview type:    PSYCHOSOCIAL DATA Living Status:  WIFE Admitted from facility:   Level of care:   Primary support name:  Eulah Citizen Primary support relationship to patient:  SPOUSE Degree of support available:   Adequate    CURRENT CONCERNS Current Concerns  Post-Acute Placement   Other Concerns:    SOCIAL WORK ASSESSMENT / PLAN Met with Pt to discuss d/c plans.  Pt difficult to understand.    Pt wants to d/c home; not interested in SNF.    Pt may have surgery.  NST being done today to see if Pt is a surgical candidate.    PT to evaluate post-surgery, if Pt is a candidate.    CSW to re-evaluate for SNF after PT's 2nd eval.    CSW thanked Pt for his time.   Assessment/plan status:  Psychosocial Support/Ongoing Assessment of Needs Other assessment/ plan:   Information/referral to community resources:    PATIENT'S/FAMILY'S RESPONSE TO PLAN OF CARE: Pt thanked CSW for time and assistance.  CSW to continue to follow.  Providence Crosby, LCSWA Clinical Social Work [phone]

## 2012-03-15 NOTE — Progress Notes (Signed)
CARE MANAGEMENT NOTE 03/15/2012  Patient:  [name], [name]   Account Number:  [id number]  Date Initiated:  03/12/2012  Documentation initiated by:  Mica Ramdass  Subjective/Objective Assessment:   pt with abd pain and new findings of colonic mass,hgb 7.1     Action/Plan:   lives at home   Anticipated DC Date:  03/18/2012   Anticipated DC Plan:  HOME/SELF CARE  In-house referral  NA      DC Planning Services  NA      [facility] Choice  NA   Choice offered to / List presented to:  NA   DME arranged  NA      DME agency  NA     HH arranged  NA      HH agency  NA   Status of service:  In process, will continue to follow Medicare Important Message given?  NA - LOS <3 / Initial given by admissions (If response is "NO", the following Medicare IM given date fields will be blank) Date Medicare IM given:   Date Additional Medicare IM given:    Discharge Disposition:    Per UR Regulation:  Reviewed for med. necessity/level of care/duration of stay  If discussed at Long Length of Stay Meetings, dates discussed:    Comments:  [id number] Tokarz Earlene Plater, RN, BSN, CCM Chart and patient information reviewed no discharge needs present at this time. Case Management [phone]   [id number] Earlene Plater, RN,  BSN, CCM No discharge needs present at time of this review. Case Management [phone]

## 2012-03-16 DIAGNOSIS — R1032 Left lower quadrant pain: Secondary | ICD-10-CM

## 2012-03-16 DIAGNOSIS — R112 Nausea with vomiting, unspecified: Secondary | ICD-10-CM

## 2012-03-16 DIAGNOSIS — E1165 Type 2 diabetes mellitus with hyperglycemia: Secondary | ICD-10-CM

## 2012-03-16 DIAGNOSIS — E860 Dehydration: Secondary | ICD-10-CM

## 2012-03-16 LAB — GLUCOSE, CAPILLARY
Glucose-Capillary: 160 mg/dL — ABNORMAL HIGH (ref 70–99)
Glucose-Capillary: 199 mg/dL — ABNORMAL HIGH (ref 70–99)

## 2012-03-16 LAB — CBC
HCT: 30.7 % — ABNORMAL LOW (ref 39.0–52.0)
MCV: 80.4 fL (ref 78.0–100.0)
RDW: 15.5 % (ref 11.5–15.5)
WBC: 7.8 10*3/uL (ref 4.0–10.5)

## 2012-03-16 LAB — BASIC METABOLIC PANEL
BUN: 13 mg/dL (ref 6–23)
Chloride: 106 mEq/L (ref 96–112)
Creatinine, Ser: 0.86 mg/dL (ref 0.50–1.35)
GFR calc Af Amer: >90 mL/min (ref >90)

## 2012-03-16 NOTE — Progress Notes (Signed)
Subjective: Remains dysarthric, denies any pain today. Wants to know what time stress test will be tomorrow. Objective: Vital signs in last 24 hours: Temp:  [98.4 F (36.9 C)-99.6 F (37.6 C)] 98.8 F (37.1 C) (06/29 1313) Pulse Rate:  [68-86] 86  (06/29 1313) Resp:  [18-20] 18  (06/29 1313) BP: (85-143)/(53-84) 143/84 mmHg (06/29 1313) SpO2:  [97 %-98 %] 98 % (06/29 1313) Weight change:  Last BM Date: 03/16/12  Intake/Output from previous day: 06/28 0701 - 06/29 0700 In: 880 [P.O.:480; IV Piggyback:400] Out: 3250 [Urine:3250] Total I/O In: 360 [P.O.:360] Out: 1201 [Urine:1200; Stool:1]   Physical Exam: General: Comfortable.  Alert, communicative,difficult to understand 2/2 dysarthria/expressive dysphasia, not short of breath at rest.  HEENT: Moderate clinical pallor, no jaundice, no conjunctival injection or discharge. Hydration status appears fair.  NECK: Supple, JVP not seen, no carotid bruits, no palpable lymphadenopathy, no palpable goiter.  CHEST: Clinically clear to auscultation, no wheezes, no crackles. Mid-line sternotomy scar is noted.  HEART: Sounds 1 and 2 heard, normal, regular, no murmurs.  ABDOMEN: Full, soft, nontender, mid abdomen and LLQ. no palpable organomegaly, no palpable masses. Bowel sounds are heard.  LOWER EXTREMITIES: No pitting edema, palpable peripheral pulses.  CENTRAL NERVOUS SYSTEM: Dysarthria, expressive dysphasia, mild RUE weakness. .  Lab Results:  Basename 03/16/12 0610 03/14/12 0515  WBC 7.8 7.4  HGB 9.9* 9.3*  HCT 30.7* 30.3*  PLT 234 225    Basename 03/16/12 0610 03/15/12 0501  NA 135 137  K 3.8 3.7  CL 106 108  CO2 21 23  GLUCOSE 140* 146*  BUN 13 13  CREATININE 0.86 0.89  CALCIUM 8.7 8.3*   Recent Results (from the past 240 hour(s))  CULTURE, BLOOD (ROUTINE X 2)     Status: Normal (Preliminary result)   Collection Time   03/11/12  5:55 PM      Component Value Range Status Comment   Specimen Description BLOOD RIGHT ARM    Final    Special Requests BOTTLES DRAWN AEROBIC AND ANAEROBIC 10CC   Final    Culture  Setup Time [id number]   Final    Culture     Final    Value:        BLOOD CULTURE RECEIVED NO GROWTH TO DATE CULTURE WILL BE HELD FOR 5 DAYS BEFORE ISSUING A FINAL NEGATIVE REPORT   Report Status PENDING   Incomplete   CULTURE, BLOOD (ROUTINE X 2)     Status: Normal (Preliminary result)   Collection Time   03/11/12  6:05 PM      Component Value Range Status Comment   Specimen Description BLOOD RIGHT HAND   Final    Special Requests BOTTLES DRAWN AEROBIC AND ANAEROBIC 10CC   Final    Culture  Setup Time [id number]   Final    Culture     Final    Value:        BLOOD CULTURE RECEIVED NO GROWTH TO DATE CULTURE WILL BE HELD FOR 5 DAYS BEFORE ISSUING A FINAL NEGATIVE REPORT   Report Status PENDING   Incomplete      Studies/Results: No results found.  Medications: Scheduled Meds:    . aspirin EC  81 mg Oral Daily  . ciprofloxacin  400 mg Intravenous Q12H  . ezetimibe-simvastatin  1 tablet Oral QHS  . feeding supplement  1 Container Oral BID BM  . insulin aspart  0-5 Units Subcutaneous QHS  . insulin aspart  0-9 Units Subcutaneous TID WC  .  LORazepam  2 mg Oral QHS  . metronidazole  500 mg Intravenous Q8H  . multivitamin with minerals  1 tablet Oral Daily  . nitroGLYCERIN  0.2 mg Transdermal Daily  . potassium chloride  40 mEq Oral Once  . sodium chloride  250 mL Intravenous Once  . sodium chloride  500 mL Intravenous Once  . sodium chloride  3 mL Intravenous Q12H  . vitamin C  500 mg Oral BID  . zinc sulfate  220 mg Oral Daily  . DISCONTD: metoprolol tartrate  12.5 mg Oral BID   Continuous Infusions:    . sodium chloride 75 mL/hr at 03/16/12 0841   PRN Meds:.acetaminophen, acetaminophen, albuterol, HYDROmorphone (DILAUDID) injection, ipratropium, LORazepam, nitroGLYCERIN, ondansetron (ZOFRAN) IV, ondansetron, oxyCODONE  Assessment/Plan:  Active Problems:  1. Abdominal pain/Colonic  mass: Patient presents with approximately one week of altered bowel habit/constipation, left lower quadrant abdominal pain, and a low grade pyrexia>was empirically treated with amoxicillin. Ciprofloxacin/Flagyl started for poss. Diverticulitis on admit.  Abdominal/pelvic CT scan showed findings most consistent with adenocarcinoma of the cecum/ascending colon, 22 x 46 x 23 mm soft tissue mass. Small but conspicuous mesenteric lymph node nearby measuring 5 mm short axis. No liver or distant metastatic disease identified in the abdomen pelvis.  Dr Lina Sar saw pt, and colonoscopy was done 6/26 - mass in ascending colon.  and CEA  Of 6/25 3.2.  Surgery following, awaiting cardiac clearance -Stress test planned for the tomorrow, per Dr. Particia Jasper 2. Dehydration: Patient had elevated BUN/Creatinine ratio, consistent with dehydration, on presentation. This has now resolved with iv fluids.  3. Anemia, Fe def: Patient has a normocytic anemia, which appeared mild at presentation at 9.4, against a background of dehydration and hemoconcentration. With iv fluids, overnight hemoglobin  dropped to 7.1 and he was transfused -hgb improved, stable, s/p transfusion 4. CAD (coronary artery disease): Patient has a known history of CAD, s/p MI,s/p CABG. He appears stable, chest pain free    -appreciate cardiology assistance, lexiscan myoview in am  For preop clearance - plavix on hold, continue B blockers  5. HTN (hypertension): BP meds on hold 2/2 hoypotension  6. COPD (chronic obstructive pulmonary disease): Stable/aymptomatic.  7. H/O: CVA (cerebrovascular accident): Stable.  8. Diabetes mellitus: This is insulin-requiring type 2,  -continue ssi 9.Hypokalemia Resolved 10.Hypotension Resolved, hemoglobin remaining stable    LOS: 5 days   Glenis Musolf C 03/16/2012, 1:31 PM

## 2012-03-16 NOTE — Progress Notes (Signed)
3 Days Post-Op  Subjective: Dysarthric and difficult to understand. Seems to have some tenderness to abd.  Objective: Vital signs in last 24 hours: Temp:  [98.4 F (36.9 C)-99.6 F (37.6 C)] 99 F (37.2 C) (06/29 0540) Pulse Rate:  [68-78] 76  (06/29 0540) Resp:  [18-20] 18  (06/29 0540) BP: (85-126)/(53-67) 106/62 mmHg (06/29 0540) SpO2:  [97 %-98 %] 98 % (06/29 0540) Last BM Date: 03/16/12  Intake/Output from previous day: 06/28 0701 - 06/29 0700 In: 880 [P.O.:480; IV Piggyback:400] Out: 3250 [Urine:3250] Intake/Output this shift: Total I/O In: 240 [P.O.:240] Out: -   GI: soft, mild tenderness diffusely  Lab Results:   Basename 03/16/12 0610 03/14/12 0515  WBC 7.8 7.4  HGB 9.9* 9.3*  HCT 30.7* 30.3*  PLT 234 225   BMET  Basename 03/16/12 0610 03/15/12 0501  NA 135 137  K 3.8 3.7  CL 106 108  CO2 21 23  GLUCOSE 140* 146*  BUN 13 13  CREATININE 0.86 0.89  CALCIUM 8.7 8.3*   PT/INR No results found for this basename: LABPROT:2,INR:2 in the last 72 hours ABG No results found for this basename: PHART:2,PCO2:2,PO2:2,HCO3:2 in the last 72 hours  Studies/Results: No results found.  Anti-infectives: Anti-infectives     Start     Dose/Rate Route Frequency Ordered Stop   03/11/12 2000   ciprofloxacin (CIPRO) IVPB 400 mg        400 mg 200 mL/hr over 60 Minutes Intravenous Every 12 hours 03/11/12 1711     03/11/12 2000   metroNIDAZOLE (FLAGYL) IVPB 500 mg        500 mg 100 mL/hr over 60 Minutes Intravenous Every 8 hours 03/11/12 1711            Assessment/Plan: s/p Procedure(s) (LRB): COLONOSCOPY (N/A) For myoview today for cardiac workup If neg then may need right colectomy next week. There still is no family to talk to  LOS: 5 days    TOTH III,Dekendrick Uzelac S 03/16/2012

## 2012-03-16 NOTE — Progress Notes (Signed)
Subjective:  Patient dysarthric and denies chest pain. Nuclear stress test could not be done today due to full schedule will schedule for tomorrow  Objective:  Vital Signs in the last 24 hours: Temp:  [98.4 F (36.9 C)-99.6 F (37.6 C)] 99 F (37.2 C) (06/29 0540) Pulse Rate:  [68-78] 76  (06/29 0540) Resp:  [18-20] 18  (06/29 0540) BP: (85-126)/(53-67) 106/62 mmHg (06/29 0540) SpO2:  [97 %-98 %] 98 % (06/29 0540)  Intake/Output from previous day: 06/28 0701 - 06/29 0700 In: 880 [P.O.:480; IV Piggyback:400] Out: 3250 [Urine:3250] Intake/Output from this shift: Total I/O In: 240 [P.O.:240] Out: -   Physical Exam: Neck: no adenopathy, no carotid bruit, no JVD and supple, symmetrical, trachea midline Lungs: clear to auscultation bilaterally Heart: regular rate and rhythm, S1, S2 normal, no murmur, click, rub or gallop Abdomen: soft, non-tender; bowel sounds normal; no masses,  no organomegaly Extremities: extremities normal, atraumatic, no cyanosis or edema  Lab Results:  Basename 03/16/12 0610 03/14/12 0515  WBC 7.8 7.4  HGB 9.9* 9.3*  PLT 234 225    Basename 03/16/12 0610 03/15/12 0501  NA 135 137  K 3.8 3.7  CL 106 108  CO2 21 23  GLUCOSE 140* 146*  BUN 13 13  CREATININE 0.86 0.89   No results found for this basename: TROPONINI:2,CK,MB:2 in the last 72 hours Hepatic Function Panel No results found for this basename: PROT,ALBUMIN,AST,ALT,ALKPHOS,BILITOT,BILIDIR,IBILI in the last 72 hours No results found for this basename: CHOL in the last 72 hours No results found for this basename: PROTIME in the last 72 hours  Imaging: Imaging results have been reviewed  Cardiac Studies:  Assessment/Plan:  Right colonic mass probable adenocarcinoma of colon  Coronary artery disease history of MI in the past status post CABG  Hypertension  Diabetes mellitus  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   LOS: 5 days    Brittish Bolinger N 03/16/2012, 10:53 AM

## 2012-03-17 ENCOUNTER — Ambulatory Visit (HOSPITAL_COMMUNITY): Payer: Medicare Other

## 2012-03-17 ENCOUNTER — Inpatient Hospital Stay (HOSPITAL_COMMUNITY): Payer: Medicare Other

## 2012-03-17 DIAGNOSIS — E860 Dehydration: Secondary | ICD-10-CM

## 2012-03-17 DIAGNOSIS — R1032 Left lower quadrant pain: Secondary | ICD-10-CM

## 2012-03-17 DIAGNOSIS — R112 Nausea with vomiting, unspecified: Secondary | ICD-10-CM

## 2012-03-17 DIAGNOSIS — E1165 Type 2 diabetes mellitus with hyperglycemia: Secondary | ICD-10-CM

## 2012-03-17 LAB — GLUCOSE, CAPILLARY: Glucose-Capillary: 145 mg/dL — ABNORMAL HIGH (ref 70–99)

## 2012-03-17 MED ORDER — TECHNETIUM TC 99M TETROFOSMIN IV KIT
30.0000 | PACK | Freq: Once | INTRAVENOUS | Status: AC | PRN
  Administered 2012-03-17: 30 via INTRAVENOUS

## 2012-03-17 MED ORDER — REGADENOSON 0.4 MG/5ML IV SOLN
0.4000 mg | Freq: Once | INTRAVENOUS | Status: AC
  Administered 2012-03-17: 0.4 mg via INTRAVENOUS
  Filled 2012-03-17: qty 5

## 2012-03-17 MED ORDER — TECHNETIUM TC 99M TETROFOSMIN IV KIT
10.0000 | PACK | Freq: Once | INTRAVENOUS | Status: AC | PRN
  Administered 2012-03-17: 10 via INTRAVENOUS

## 2012-03-17 NOTE — Progress Notes (Signed)
Progress note sent to on call regarding foley catheter. Xavius Spadafore, Cheryll Dessert

## 2012-03-17 NOTE — Progress Notes (Signed)
Subjective: Denies chest pain, no SOB Objective: Vital signs in last 24 hours: Temp:  [98.2 F (36.8 C)-98.7 F (37.1 C)] 98.4 F (36.9 C) (06/30 1420) Pulse Rate:  [71-79] 71  (06/30 1420) Resp:  [18] 18  (06/30 1420) BP: (83-109)/(53-70) 96/63 mmHg (06/30 1420) SpO2:  [96 %-100 %] 98 % (06/30 1420) Weight change:  Last BM Date: 03/16/12  Intake/Output from previous day: 06/29 0701 - 06/30 0700 In: 360 [P.O.:360] Out: 3651 [Urine:3650; Stool:1]     Physical Exam: General: Comfortable.  Alert, communicative,difficult to understand 2/2 dysarthria/expressive dysphasia, not short of breath at rest.  HEENT: Moderate clinical pallor, no jaundice, no conjunctival injection or discharge. Hydration status appears fair.  NECK: Supple, JVP not seen, no carotid bruits, no palpable lymphadenopathy, no palpable goiter.  CHEST: Clinically clear to auscultation, no wheezes, no crackles. Mid-line sternotomy scar is noted.  HEART: Sounds 1 and 2 heard, normal, regular, no murmurs.  ABDOMEN: Full, soft, nontender, mid abdomen and LLQ. no palpable organomegaly, no palpable masses. Bowel sounds are heard.  LOWER EXTREMITIES: No pitting edema, palpable peripheral pulses.  CENTRAL NERVOUS SYSTEM: Dysarthria, expressive dysphasia, mild RUE weakness. .  Lab Results:  Basename 03/16/12 0610  WBC 7.8  HGB 9.9*  HCT 30.7*  PLT 234    Basename 03/16/12 0610 03/15/12 0501  NA 135 137  K 3.8 3.7  CL 106 108  CO2 21 23  GLUCOSE 140* 146*  BUN 13 13  CREATININE 0.86 0.89  CALCIUM 8.7 8.3*   Recent Results (from the past 240 hour(s))  CULTURE, BLOOD (ROUTINE X 2)     Status: Normal (Preliminary result)   Collection Time   03/11/12  5:55 PM      Component Value Range Status Comment   Specimen Description BLOOD RIGHT ARM   Final    Special Requests BOTTLES DRAWN AEROBIC AND ANAEROBIC 10CC   Final    Culture  Setup Time [id number]   Final    Culture     Final    Value:        BLOOD CULTURE  RECEIVED NO GROWTH TO DATE CULTURE WILL BE HELD FOR 5 DAYS BEFORE ISSUING A FINAL NEGATIVE REPORT   Report Status PENDING   Incomplete   CULTURE, BLOOD (ROUTINE X 2)     Status: Normal (Preliminary result)   Collection Time   03/11/12  6:05 PM      Component Value Range Status Comment   Specimen Description BLOOD RIGHT HAND   Final    Special Requests BOTTLES DRAWN AEROBIC AND ANAEROBIC 10CC   Final    Culture  Setup Time [id number]   Final    Culture     Final    Value:        BLOOD CULTURE RECEIVED NO GROWTH TO DATE CULTURE WILL BE HELD FOR 5 DAYS BEFORE ISSUING A FINAL NEGATIVE REPORT   Report Status PENDING   Incomplete      Studies/Results: Nm Myocar Multi W/spect W/wall Motion / Ef  03/17/2012  *RADIOLOGY REPORT*  Clinical Data:  Chest pain  Technique:  Standard myocardial SPECT imaging performed after resting intravenous injection of 10 mCi Tc-60m tetrofosmin . Subsequently, intravenous infusion of Lexiscan performed under the supervision of the Cardiology staff.  At peak effect of the drug, 30 mCi Tc-60mtetrofosmin injected intravenously and standard myocardial SPECT imaging performed.  Quantitative gated imaging also performed to evaluate left ventricular wall motion and estimate left ventricular ejection fraction.  Comparison:  None  MYOCARDIAL IMAGING WITH SPECT (REST AND PHARMACOLOGIC-STRESS)  Findings:  There is a small area of mild reversibility involving the apical segment of the anterior wall.  The summed difference score is equal to one.  This likely reflects nonuniform soft tissue attenuation artifact.  GATED LEFT VENTRICULAR WALL MOTION STUDY  Findings:  Review of the gated images demonstrates moderate hypokinesis involving the mid and apical segments of the anterior septum is noted.  There is mild hypokinesis involving the basilar segments of the lateral wall.  Normal wall thickening.  LEFT VENTRICULAR EJECTION FRACTION  Findings:  QGS ejection fraction measures 61% , with an  end- diastolic volume of 84 ml and an end-systolic volume of 33 ml.  IMPRESSION:  1. Small area of mild reversibility involving the apical segment of the anterior wall.  This has a summed difference score equal to one and likely reflects nonuniform soft tissue attenuation artifact. True myocardial ischemia is considered less favored.  2.  Anterior septal hypokinesis. 3.  Left ventricular ejection fraction equals 61%.  Original Report Authenticated By: Rosealee Albee, M.D.    Medications: Scheduled Meds:    . aspirin EC  81 mg Oral Daily  . ciprofloxacin  400 mg Intravenous Q12H  . ezetimibe-simvastatin  1 tablet Oral QHS  . feeding supplement  1 Container Oral BID BM  . insulin aspart  0-5 Units Subcutaneous QHS  . insulin aspart  0-9 Units Subcutaneous TID WC  . LORazepam  2 mg Oral QHS  . metronidazole  500 mg Intravenous Q8H  . multivitamin with minerals  1 tablet Oral Daily  . nitroGLYCERIN  0.2 mg Transdermal Daily  . potassium chloride  40 mEq Oral Once  . regadenoson  0.4 mg Intravenous Once  . sodium chloride  250 mL Intravenous Once  . sodium chloride  3 mL Intravenous Q12H  . vitamin C  500 mg Oral BID  . zinc sulfate  220 mg Oral Daily   Continuous Infusions:    . sodium chloride 75 mL/hr at 03/17/12 0332   PRN Meds:.acetaminophen, acetaminophen, albuterol, HYDROmorphone (DILAUDID) injection, ipratropium, LORazepam, nitroGLYCERIN, ondansetron (ZOFRAN) IV, ondansetron, oxyCODONE, technetium tetrofosmin, technetium tetrofosmin  Assessment/Plan:  Active Problems:  1. Abdominal pain/Colonic mass: Patient presents with approximately one week of altered bowel habit/constipation, left lower quadrant abdominal pain, and a low grade pyrexia>was empirically treated with amoxicillin. Ciprofloxacin/Flagyl started for poss. Diverticulitis on admit.  Abdominal/pelvic CT scan showed findings most consistent with adenocarcinoma of the cecum/ascending colon, 22 x 46 x 23 mm soft tissue  mass. Small but conspicuous mesenteric lymph node nearby measuring 5 mm short axis. No liver or distant metastatic disease identified in the abdomen pelvis.  Dr Lina Sar saw pt, and colonoscopy was done 6/26 - mass in ascending colon.  and CEA  Of 6/25 3.2.  Surgery following, awaiting cardiac clearance -cardiac cath now planned for am following +stress test today , per Dr. Particia Jasper 2. Dehydration: Patient had elevated BUN/Creatinine ratio, consistent with dehydration, on presentation. This has now resolved with iv fluids.  3. Anemia, Fe def: Patient has a normocytic anemia, which appeared mild at presentation at 9.4, against a background of dehydration and hemoconcentration. With iv fluids, overnight hemoglobin  dropped to 7.1 and he was transfused -hgb improved s/p transfusion, recheck in am 4. CAD (coronary artery disease)/+stress test: Patient has a known history of CAD, s/p MI,s/p CABG. He appears stable, chest pain free    -cath in am  For preop clearance - plavix on  hold, continue B blockers  5. HTN (hypertension): BP meds on hold 2/2 hoypotension  6. COPD (chronic obstructive pulmonary disease): Stable/aymptomatic.  7. H/O: CVA (cerebrovascular accident): Stable.  8. 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

## 2012-03-17 NOTE — Progress Notes (Signed)
4 Days Post-Op  Subjective: [facility] for [facility] test  Objective: Vital signs in last 24 hours: Temp:  [98.2 F (36.8 C)-98.8 F (37.1 C)] 98.7 F (37.1 C) (06/30 0702) Pulse Rate:  [71-86] 71  (06/30 0702) Resp:  [18] 18  (06/30 0702) BP: (83-143)/(53-84) 96/60 mmHg (06/30 0941) SpO2:  [96 %-100 %] 96 % (06/30 0702) Last BM Date: 03/16/12  Intake/Output from previous day: 06/29 0701 - 06/30 0700 In: 360 [P.O.:360] Out: 3651 [Urine:3650; Stool:1] Intake/Output this shift:    GI: pt not in this facility  Lab Results:   [facility] 03/16/12 0610  WBC 7.8  HGB 9.9*  HCT 30.7*  PLT 234   BMET  Basename 03/16/12 0610 03/15/12 0501  NA 135 137  K 3.8 3.7  CL 106 108  CO2 21 23  GLUCOSE 140* 146*  BUN 13 13  CREATININE 0.86 0.89  CALCIUM 8.7 8.3*   PT/INR No results found for this basename: LABPROT:2,INR:2 in the last 72 hours ABG No results found for this basename: PHART:2,PCO2:2,PO2:2,HCO3:2 in the last 72 hours  Studies/Results: No results found.  Anti-infectives: Anti-infectives     Start     Dose/Rate Route Frequency Ordered Stop   03/11/12 2000   ciprofloxacin (CIPRO) IVPB 400 mg        400 mg 200 mL/hr over 60 Minutes Intravenous Every 12 hours 03/11/12 1711     03/11/12 2000   metroNIDAZOLE (FLAGYL) IVPB 500 mg        500 mg 100 mL/hr over 60 Minutes Intravenous Every 8 hours 03/11/12 1711            Assessment/Plan: s/p Procedure(s) (LRB): COLONOSCOPY (N/A) Stress test today  LOS: 6 days    TOTH III,Dawn Kiper S 03/17/2012

## 2012-03-17 NOTE — Progress Notes (Signed)
Subjective:   denies any chest pain or shortness of breath. Patient underwent nuclear stress test today strongly positive by EKG criteria nuclear scan results still pending  Objective:  Vital Signs in the last 24 hours: Temp:  [98.2 F (36.8 C)-98.8 F (37.1 C)] 98.7 F (37.1 C) (06/30 0702) Pulse Rate:  [71-86] 71  (06/30 0702) Resp:  [18] 18  (06/30 0702) BP: (83-143)/(53-84) 96/60 mmHg (06/30 0941) SpO2:  [96 %-100 %] 96 % (06/30 0702)  Intake/Output from previous day: 06/29 0701 - 06/30 0700 In: 360 [P.O.:360] Out: 3651 [Urine:3650; Stool:1] Intake/Output from this shift:    Physical Exam: Neck: no adenopathy, no carotid bruit, no JVD and supple, symmetrical, trachea midline Lungs: clear to auscultation bilaterally Heart: regular rate and rhythm, S1, S2 normal, no murmur, click, rub or gallop Abdomen: soft, non-tender; bowel sounds normal; no masses,  no organomegaly Extremities: extremities normal, atraumatic, no cyanosis or edema  Lab Results:  [facility] 03/16/12 0610  WBC 7.8  HGB 9.9*  PLT 234    Basename 03/16/12 0610 03/15/12 0501  NA 135 137  K 3.8 3.7  CL 106 108  CO2 21 23  GLUCOSE 140* 146*  BUN 13 13  CREATININE 0.86 0.89   No results found for this basename: TROPONINI:2,CK,MB:2 in the last 72 hours Hepatic Function Panel No results found for this basename: PROT,ALBUMIN,AST,ALT,ALKPHOS,BILITOT,BILIDIR,IBILI in the last 72 hours No results found for this basename: CHOL in the last 72 hours No results found for this basename: PROTIME in the last 72 hours  Imaging: Imaging results have been reviewed and No results found.  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 Check  nuclear scan result Will cardiac cath prior to surgical clearance Will discuss with surgery and family   LOS: 6 days    Kimberly Nieland N 03/17/2012, 10:39 AM

## 2012-03-18 DIAGNOSIS — I10 Essential (primary) hypertension: Secondary | ICD-10-CM

## 2012-03-18 DIAGNOSIS — C19 Malignant neoplasm of rectosigmoid junction: Secondary | ICD-10-CM

## 2012-03-18 LAB — CBC
HCT: 28.7 % — ABNORMAL LOW (ref 39.0–52.0)
Hemoglobin: 9.4 g/dL — ABNORMAL LOW (ref 13.0–17.0)
MCH: 26 pg (ref 26.0–34.0)
MCHC: 32.8 g/dL (ref 30.0–36.0)
MCV: 79.5 fL (ref 78.0–100.0)
RDW: 15.4 % (ref 11.5–15.5)

## 2012-03-18 LAB — CULTURE, BLOOD (ROUTINE X 2): Culture: NO GROWTH

## 2012-03-18 LAB — GLUCOSE, CAPILLARY
Glucose-Capillary: 147 mg/dL — ABNORMAL HIGH (ref 70–99)
Glucose-Capillary: 203 mg/dL — ABNORMAL HIGH (ref 70–99)

## 2012-03-18 LAB — BASIC METABOLIC PANEL
BUN: 14 mg/dL (ref 6–23)
Calcium: 8.5 mg/dL (ref 8.4–10.5)
Creatinine, Ser: 0.85 mg/dL (ref 0.50–1.35)
GFR calc Af Amer: >90 mL/min (ref >90)
GFR calc non Af Amer: 86 mL/min — ABNORMAL LOW (ref >90)
Glucose, Bld: 120 mg/dL — ABNORMAL HIGH (ref 70–99)

## 2012-03-18 NOTE — Progress Notes (Signed)
Subjective: Family at bedside, pt denies chest pain, no SOB Objective: Vital signs in last 24 hours: Temp:  [98.7 F (37.1 C)-99.3 F (37.4 C)] 98.9 F (37.2 C) (07/01 1300) Pulse Rate:  [73-79] 75  (07/01 1300) Resp:  [18-20] 18  (07/01 1300) BP: (105-135)/(49-62) 115/60 mmHg (07/01 1300) SpO2:  [98 %-99 %] 99 % (07/01 1300) Weight change:  Last BM Date: 03/17/12  Intake/Output from previous day: 06/30 0701 - 07/01 0700 In: 940 [P.O.:240; I.V.:300; IV Piggyback:400] Out: 1801 [Urine:1800; Stool:1] Total I/O In: 1440 [P.O.:840; I.V.:600] Out: 1650 [Urine:1650]   Physical Exam: General: Comfortable.  Alert, communicative,difficult to understand 2/2 dysarthria/expressive dysphasia, no resp. distress.  CHEST: Clinically clear to auscultation, no wheezes, no crackles. Mid-line sternotomy scar is noted.  HEART: Sounds 1 and 2 heard, normal, regular, no murmurs.  ABDOMEN: Full, soft, nontender, mid abdomen and LLQ. no palpable organomegaly, no palpable masses. Bowel sounds are heard.  LOWER EXTREMITIES: No pitting edema, palpable peripheral pulses.  CENTRAL NERVOUS SYSTEM: Dysarthria, expressive dysphasia, mild RUE weakness. .  Lab Results:  Basename 03/18/12 0448 03/16/12 0610  WBC 6.9 7.8  HGB 9.4* 9.9*  HCT 28.7* 30.7*  PLT 243 234    Basename 03/18/12 0448 03/16/12 0610  NA 137 135  K 3.7 3.8  CL 107 106  CO2 22 21  GLUCOSE 120* 140*  BUN 14 13  CREATININE 0.85 0.86  CALCIUM 8.5 8.7   Recent Results (from the past 240 hour(s))  CULTURE, BLOOD (ROUTINE X 2)     Status: Normal   Collection Time   03/11/12  5:55 PM      Component Value Range Status Comment   Specimen Description BLOOD RIGHT ARM   Final    Special Requests BOTTLES DRAWN AEROBIC AND ANAEROBIC 10CC   Final    Culture  Setup Time 03/12/2012 01:32   Final    Culture NO GROWTH 5 DAYS   Final    Report Status 03/18/2012 FINAL   Final   CULTURE, BLOOD (ROUTINE X 2)     Status: Normal   Collection Time     03/11/12  6:05 PM      Component Value Range Status Comment   Specimen Description BLOOD RIGHT HAND   Final    Special Requests BOTTLES DRAWN AEROBIC AND ANAEROBIC 10CC   Final    Culture  Setup Time 03/12/2012 01:33   Final    Culture NO GROWTH 5 DAYS   Final    Report Status 03/18/2012 FINAL   Final      Studies/Results: Nm Myocar Multi W/spect W/wall Motion / Ef  03/17/2012  *RADIOLOGY REPORT*  Clinical Data:  Chest pain  Technique:  Standard myocardial SPECT imaging performed after resting intravenous injection of 10 mCi Tc-63m tetrofosmin . Subsequently, intravenous infusion of Lexiscan performed under the supervision of the Cardiology staff.  At peak effect of the drug, 30 mCi Tc-41mtetrofosmin injected intravenously and standard myocardial SPECT imaging performed.  Quantitative gated imaging also performed to evaluate left ventricular wall motion and estimate left ventricular ejection fraction.  Comparison:  None  MYOCARDIAL IMAGING WITH SPECT (REST AND PHARMACOLOGIC-STRESS)  Findings:  There is a small area of mild reversibility involving the apical segment of the anterior wall.  The summed difference score is equal to one.  This likely reflects nonuniform soft tissue attenuation artifact.  GATED LEFT VENTRICULAR WALL MOTION STUDY  Findings:  Review of the gated images demonstrates moderate hypokinesis involving the mid and apical segments  of the anterior septum is noted.  There is mild hypokinesis involving the basilar segments of the lateral wall.  Normal wall thickening.  LEFT VENTRICULAR EJECTION FRACTION  Findings:  QGS ejection fraction measures 61% , with an end- diastolic volume of 84 ml and an end-systolic volume of 33 ml.  IMPRESSION:  1. Small area of mild reversibility involving the apical segment of the anterior wall.  This has a summed difference score equal to one and likely reflects nonuniform soft tissue attenuation artifact. True myocardial ischemia is considered less favored.   2.  Anterior septal hypokinesis. 3.  Left ventricular ejection fraction equals 61%.  Original Report Authenticated By: Rosealee Albee, M.D.    Medications: Scheduled Meds:    . aspirin EC  81 mg Oral Daily  . ciprofloxacin  400 mg Intravenous Q12H  . ezetimibe-simvastatin  1 tablet Oral QHS  . feeding supplement  1 Container Oral BID BM  . insulin aspart  0-5 Units Subcutaneous QHS  . insulin aspart  0-9 Units Subcutaneous TID WC  . LORazepam  2 mg Oral QHS  . metronidazole  500 mg Intravenous Q8H  . multivitamin with minerals  1 tablet Oral Daily  . nitroGLYCERIN  0.2 mg Transdermal Daily  . potassium chloride  40 mEq Oral Once  . sodium chloride  250 mL Intravenous Once  . sodium chloride  3 mL Intravenous Q12H  . vitamin C  500 mg Oral BID  . zinc sulfate  220 mg Oral Daily   Continuous Infusions:    . sodium chloride 75 mL/hr at 03/18/12 1645   PRN Meds:.acetaminophen, acetaminophen, albuterol, HYDROmorphone (DILAUDID) injection, ipratropium, LORazepam, nitroGLYCERIN, ondansetron (ZOFRAN) IV, ondansetron, oxyCODONE  Assessment/Plan:  Active Problems:  1. Abdominal pain/Colonic mass: Patient presents with approximately one week of altered bowel habit/constipation, left lower quadrant abdominal pain, and a low grade pyrexia>was empirically treated with amoxicillin. Ciprofloxacin/Flagyl started for poss. Diverticulitis on admit.  Abdominal/pelvic CT scan showed findings most consistent with adenocarcinoma of the cecum/ascending colon, 22 x 46 x 23 mm soft tissue mass. Small but conspicuous mesenteric lymph node nearby measuring 5 mm short axis. No liver or distant metastatic disease identified in the abdomen pelvis.  Dr Lina Sar saw pt, and colonoscopy was done 6/26 - mass in ascending colon.  and CEA  Of 6/25 3.2.  Surgery following, awaiting cardiac clearance -cards to decide on timing of cath following +stress test on 6/30 , per Dr. Particia Jasper 2. Dehydration: Patient had  elevated BUN/Creatinine ratio, consistent with dehydration, on presentation. This has now resolved with iv fluids.  3. Anemia, Fe def: Patient has a normocytic anemia, which appeared mild at presentation at 9.4, against a background of dehydration and hemoconcentration. With iv fluids, overnight hemoglobin  dropped to 7.1 and he was transfused -hgb improved s/p transfusion, stable  4. CAD (coronary artery disease)/+stress test: Patient has a known history of CAD, s/p MI,s/p CABG. He appears stable, chest pain free    -awaiting cath For preop clearance - plavix on hold, continue B blockers  5. HTN (hypertension): BP meds on hold 2/2 hoypotension  6. COPD (chronic obstructive pulmonary disease): Stable/aymptomatic.  7. H/O: CVA (cerebrovascular accident): Stable.  8. Diabetes mellitus: This is insulin-requiring type 2,  -continue ssi 9.Hypokalemia Resolved 10.Hypotension Resolved, hemoglobin remaining stable    LOS: 7 days   Marlie Kuennen C 03/18/2012, 6:19 PM

## 2012-03-18 NOTE — Progress Notes (Signed)
5 Days Post-Op  Subjective: Daughter, her husband and grandson in room today.  They are aware of the issues, and plans for cardiac cath.  They don't want him to know it's cancer.  They say he worries to much.  He is listening to [place] talk, he seems to understand and says he does, but  I can't be sure.    Objective: Vital signs in last 24 hours: Temp:  [98.4 F (36.9 C)-99.3 F (37.4 C)] 99.3 F (37.4 C) (07/01 0703) Pulse Rate:  [71-79] 79  (07/01 0703) Resp:  [18-20] 20  (07/01 0703) BP: (96-135)/(49-63) 105/49 mmHg (07/01 0703) SpO2:  [98 %-99 %] 98 % (07/01 0703) Last BM Date: 03/17/12  Diet: Carb modified, afebrile, VSS,  Intake/Output from previous day: 06/30 0701 - 07/01 0700 In: 940 [P.O.:240; I.V.:300; IV Piggyback:400] Out: 1801 [Urine:1800; Stool:1] Intake/Output this shift: Total I/O In: -  Out: 350 [Urine:350]  General appearance: alert, cooperative and no distress GI: soft, non-tender; bowel sounds normal; no masses,  no organomegaly  Lab Results:   Basename 03/18/12 0448 03/16/12 0610  WBC 6.9 7.8  HGB 9.4* 9.9*  HCT 28.7* 30.7*  PLT 243 234    BMET  Basename 03/18/12 0448 03/16/12 0610  NA 137 135  K 3.7 3.8  CL 107 106  CO2 22 21  GLUCOSE 120* 140*  BUN 14 13  CREATININE 0.85 0.86  CALCIUM 8.5 8.7   PT/INR No results found for this basename: LABPROT:2,INR:2 in the last 72 hours   Lab 03/12/12 0436  AST 20  ALT 10  ALKPHOS 65  BILITOT 0.2*  PROT 5.9*  ALBUMIN 3.0*     Lipase  No results found for this basename: lipase     Studies/Results: Nm Myocar Multi W/spect W/wall Motion / Ef  03/17/2012  *RADIOLOGY REPORT*  Clinical Data:  Chest pain  Technique:  Standard myocardial SPECT imaging performed after resting intravenous injection of 10 mCi Tc-24m tetrofosmin . Subsequently, intravenous infusion of Lexiscan performed under the supervision of the Cardiology staff.  At peak effect of the drug, 30 mCi Tc-65mtetrofosmin injected  intravenously and standard myocardial SPECT imaging performed.  Quantitative gated imaging also performed to evaluate left ventricular wall motion and estimate left ventricular ejection fraction.  Comparison:  None  MYOCARDIAL IMAGING WITH SPECT (REST AND PHARMACOLOGIC-STRESS)  Findings:  There is a small area of mild reversibility involving the apical segment of the anterior wall.  The summed difference score is equal to one.  This likely reflects nonuniform soft tissue attenuation artifact.  GATED LEFT VENTRICULAR WALL MOTION STUDY  Findings:  Review of the gated images demonstrates moderate hypokinesis involving the mid and apical segments of the anterior septum is noted.  There is mild hypokinesis involving the basilar segments of the lateral wall.  Normal wall thickening.  LEFT VENTRICULAR EJECTION FRACTION  Findings:  QGS ejection fraction measures 61% , with an end- diastolic volume of 84 ml and an end-systolic volume of 33 ml.  IMPRESSION:  1. Small area of mild reversibility involving the apical segment of the anterior wall.  This has a summed difference score equal to one and likely reflects nonuniform soft tissue attenuation artifact. True myocardial ischemia is considered less favored.  2.  Anterior septal hypokinesis. 3.  Left ventricular ejection fraction equals 61%.  Original Report Authenticated By: Rosealee Albee, M.D.    Medications:    . aspirin EC  81 mg Oral Daily  . ciprofloxacin  400 mg Intravenous  Q12H  . ezetimibe-simvastatin  1 tablet Oral QHS  . feeding supplement  1 Container Oral BID BM  . insulin aspart  0-5 Units Subcutaneous QHS  . insulin aspart  0-9 Units Subcutaneous TID WC  . LORazepam  2 mg Oral QHS  . metronidazole  500 mg Intravenous Q8H  . multivitamin with minerals  1 tablet Oral Daily  . nitroGLYCERIN  0.2 mg Transdermal Daily  . potassium chloride  40 mEq Oral Once  . sodium chloride  250 mL Intravenous Once  . sodium chloride  3 mL Intravenous Q12H  .  vitamin C  500 mg Oral BID  . zinc sulfate  220 mg Oral Daily    Assessment/Plan Ascending colon mass, bx: Adenocarcinoma  Dehydration  Anemia  CAD,hx of MI/CABG on Plavix / Dr. Sharyn Lull obtained Cardiolite, with area of ischemia, EF 61%; awaiting cardiac Cath AODM TYPE II  COPD  Stroke, with ongoing dysarthria, expressive dysphasia, and RUE weakness  Dyslipidemia   Plan:  He is going to undergo cardiac cath.  If he gets a stent and requires plavix that is going to make surgery timing an issue.  Plavix is currently on hold.  We will continue to follow. Family says they currently hope he will go home with wife after surgery.     LOS: 7 days    [name] 03/18/2012

## 2012-03-18 NOTE — Progress Notes (Signed)
Subjective:  Patient denies any chest pain or shortness of breath Discuss with daughter over the phone regarding abnormal stress test result and left cath possible PTCA stenting with bare metal stenting as patient will need right hemicolectomy in near future its risk and benefits i.e. death MI stroke need for emergency CABG risk of restenosis local vascular complications etc. and consented for procedure  Objective:  Vital Signs in the last 24 hours: Temp:  [98.7 F (37.1 C)-99.3 F (37.4 C)] 98.9 F (37.2 C) (07/01 1300) Pulse Rate:  [73-79] 75  (07/01 1300) Resp:  [18-20] 18  (07/01 1300) BP: (105-135)/(49-62) 115/60 mmHg (07/01 1300) SpO2:  [98 %-99 %] 99 % (07/01 1300)  Intake/Output from previous day: 06/30 0701 - 07/01 0700 In: 940 [P.O.:240; I.V.:300; IV Piggyback:400] Out: 1801 [Urine:1800; Stool:1] Intake/Output from this shift:    Physical Exam: Neck: no adenopathy, no carotid bruit, no JVD and supple, symmetrical, trachea midline Lungs: clear to auscultation bilaterally Heart: regular rate and rhythm, S1, S2 normal, no murmur, click, rub or gallop Abdomen: soft, non-tender; bowel sounds normal; no masses,  no organomegaly Extremities: extremities normal, atraumatic, no cyanosis or edema  Lab Results:  Basename 03/18/12 0448 03/16/12 0610  WBC 6.9 7.8  HGB 9.4* 9.9*  PLT 243 234    Basename 03/18/12 0448 03/16/12 0610  NA 137 135  K 3.7 3.8  CL 107 106  CO2 22 21  GLUCOSE 120* 140*  BUN 14 13  CREATININE 0.85 0.86   No results found for this basename: TROPONINI:2,CK,MB:2 in the last 72 hours Hepatic Function Panel No results found for this basename: PROT,ALBUMIN,AST,ALT,ALKPHOS,BILITOT,BILIDIR,IBILI in the last 72 hours No results found for this basename: CHOL in the last 72 hours No results found for this basename: PROTIME in the last 72 hours  Imaging: Imaging results have been reviewed and Nm Myocar Multi W/spect W/wall Motion / Ef  03/17/2012   *RADIOLOGY REPORT*  Clinical Data:  Chest pain  Technique:  Standard myocardial SPECT imaging performed after resting intravenous injection of 10 mCi Tc-2m tetrofosmin . Subsequently, intravenous infusion of Lexiscan performed under the supervision of the Cardiology staff.  At peak effect of the drug, 30 mCi Tc-27mtetrofosmin injected intravenously and standard myocardial SPECT imaging performed.  Quantitative gated imaging also performed to evaluate left ventricular wall motion and estimate left ventricular ejection fraction.  Comparison:  None  MYOCARDIAL IMAGING WITH SPECT (REST AND PHARMACOLOGIC-STRESS)  Findings:  There is a small area of mild reversibility involving the apical segment of the anterior wall.  The summed difference score is equal to one.  This likely reflects nonuniform soft tissue attenuation artifact.  GATED LEFT VENTRICULAR WALL MOTION STUDY  Findings:  Review of the gated images demonstrates moderate hypokinesis involving the mid and apical segments of the anterior septum is noted.  There is mild hypokinesis involving the basilar segments of the lateral wall.  Normal wall thickening.  LEFT VENTRICULAR EJECTION FRACTION  Findings:  QGS ejection fraction measures 61% , with an end- diastolic volume of 84 ml and an end-systolic volume of 33 ml.  IMPRESSION:  1. Small area of mild reversibility involving the apical segment of the anterior wall.  This has a summed difference score equal to one and likely reflects nonuniform soft tissue attenuation artifact. True myocardial ischemia is considered less favored.  2.  Anterior septal hypokinesis. 3.  Left ventricular ejection fraction equals 61%.  Original Report Authenticated By: Rosealee Albee, M.D.    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 discussed with patient's family regarding risk and benefits and consents for PCI  LOS: 7 days    Broughton Eppinger N 03/18/2012, 8:17 PM

## 2012-03-18 NOTE — Progress Notes (Signed)
Physical Therapy Treatment Patient Details Name: [name] MRN: [id number] DOB: 1940-12-20 Today's Date: 03/18/2012 Time: 3086-5784 PT Time Calculation (min): 13 min  PT Assessment / Plan / Recommendation Comments on Treatment Session  Improved stability this session with ambulation compared to eval. Still requires external assist at times to maintain balance. Pt for cardiac cath today to help determine if pt is cleared for possible surgery. Will continue to follow to assess d/c plans if pt has surgery and functional status changes.   Follow Up Recommendations  Home health PT; 24 hour supervision/assist (May change if pt has surgery and status declines)   Barriers to Discharge        Equipment Recommendations   (to be determined)    Recommendations for Other Services    Frequency Min 3X/week   Plan Discharge plan remains appropriate    Precautions / Restrictions Precautions Precautions: Fall Restrictions Weight Bearing Restrictions: No   Pertinent Vitals/Pain     Mobility  Bed Mobility Bed Mobility: Not assessed Transfers Transfers: Sit to Stand;Stand to Sit Sit to Stand: 4: Min assist;With upper extremity assist;From chair/3-in-1 Stand to Sit: With upper extremity assist;To chair/3-in-1;4: Min guard Details for Transfer Assistance: VCs safety, hand placement. Assist to stabilize with initial standing Ambulation/Gait Ambulation/Gait Assistance: 4: Min assist Ambulation Distance (Feet): 130 Feet Assistive device: Rolling walker;1 person hand held assist Ambulation/Gait Assistance Details: 65 feet with RW, 65 feet without. VCs safety, distance from RW. Assist to stabilize throughout ambulation. Noted some leaning to R side. Increased sway to R side. Intermiitent scissory, especially after turning/change in direction Gait Pattern: Step-through pattern    Exercises     PT Diagnosis:    PT Problem List:   PT Treatment Interventions:     PT Goals Acute Rehab PT  Goals PT Goal: Sit to Stand - Progress: Progressing toward goal PT Goal: Ambulate - Progress: Progressing toward goal  Visit Information  Last PT Received On: 03/18/12 Assistance Needed: +1    Subjective Data  Subjective: Expressive aphasia.  Patient Stated Goal: None stated   Cognition  Overall Cognitive Status: Difficult to assess Difficult to assess due to: Impaired communication Arousal/Alertness: Awake/alert Orientation Level: Appears intact for tasks assessed Behavior During Session: [facility] for tasks performed    Balance  Static Standing Balance Static Standing - Level of Assistance: 5: Stand by assistance  End of Session PT - End of Session Equipment Utilized During Treatment: Gait belt Activity Tolerance: Patient tolerated treatment well Patient left: in chair;with chair alarm set   GP     Rebeca Alert [place] 03/18/2012, 12:06 PM [phone]

## 2012-03-18 NOTE — Progress Notes (Signed)
Occupational Therapy Treatment Patient Details Name: [name] MRN: [id number] DOB: 25-Feb-1941 Today's Date: 03/18/2012 Time: 8657-8469 OT Time Calculation (min): 13 min  OT Assessment / Plan / Recommendation Comments on Treatment Session pt with dysarthria and expressive aphasia.  Seems to answer questions appropriately    Follow Up Recommendations  Home health OT (depending upon how pt does after surgery)    Barriers to Discharge       Equipment Recommendations   (to be determined)    Recommendations for Other Services    Frequency Min 2X/week   Plan Other (comment) (plan is for surgery--will update after this)    Precautions / Restrictions Precautions Precautions: Fall Restrictions Weight Bearing Restrictions: No   Pertinent Vitals/Pain No pain    ADL  Grooming: Performed;Wash/dry hands;Wash/dry face;Supervision/safety Where Assessed - Grooming: Supported standing Toilet Transfer: Simulated;Minimal assistance (chair, sink, chair) Toilet Transfer Method:  (ambulate) Transfers/Ambulation Related to ADLs: min A with walker and then with hand held Assist.  Cotx with PT    OT Diagnosis:    OT Problem List:   OT Treatment Interventions:     OT Goals Acute Rehab OT Goals Time For Goal Achievement: 03/29/12 ADL Goals Pt Will Perform Grooming: with supervision;Standing at sink ADL Goal: Grooming - Progress: Met Pt Will Transfer to Toilet: with supervision;with DME;Ambulation;3-in-1 ADL Goal: Toilet Transfer - Progress: Progressing toward goals  Visit Information  Last OT Received On: 03/18/12 Assistance Needed: +1 PT/OT Co-Evaluation/Treatment: Yes    Subjective Data      Prior Functioning       Cognition  Overall Cognitive Status: Appears within functional limits for tasks assessed/performed Difficult to assess due to: Impaired communication Arousal/Alertness: Awake/alert Orientation Level: Appears intact for tasks assessed Behavior During Session:  [name] for tasks performed Cognition - Other Comments: pt tearful a couple of times.      Mobility Bed Mobility Bed Mobility: Not assessed Transfers Sit to Stand: 4: Min assist;With upper extremity assist;From chair/3-in-1 Stand to Sit: With upper extremity assist;To chair/3-in-1;4: Min guard Details for Transfer Assistance: min vcs for safety   Exercises    Balance Static Standing Balance Static Standing - Level of Assistance: 5: Stand by assistance  End of Session OT - End of Session Equipment Utilized During Treatment: Gait belt Activity Tolerance: Patient tolerated treatment well Patient left: in chair;with call bell/phone within reach;with chair alarm set (pt did not want phone nor table by him)  GO     [name] 03/18/2012, 12:37 PM [name], OTR/L [phone] 03/18/2012

## 2012-03-18 NOTE — Progress Notes (Signed)
Patient showed junctional rhythm on telemetry. Upon assessment it was determined that telemetry leads were misplaced on the patient. Patient was alert, oriented and asymptomatic.

## 2012-03-18 NOTE — Progress Notes (Signed)
Spoke w/ Dr Sharyn Lull by phone who has requested that we transfer [name] to [facility] for cardiac cath and possible PTCA in am. I saw pt at bedside who denied any complaints and agreed that he understood the plan to be transferred to [facility] for the procedure in the morning. Current VS BP-165/79, T-98.7, P-79, R-20 w/ 02 sats @ 96% on R/A.

## 2012-03-18 NOTE — Progress Notes (Signed)
Patient seen and examined.  Waiting for cardiac work up before deciding on timing of surgery.

## 2012-03-18 NOTE — Progress Notes (Signed)
Met with Pt, daughter, Kathie Rhodes, and son-in-law.  Daughter reports that Pt may have surgery within the next day or two.  Pt became upset upon hearing this and began crying.  Pt cried throughout CSW's conversation with Kathie Rhodes.  Kathie Rhodes stated that her mom has been, and is capable of, caring for Pt in the home.  Kathie Rhodes stated that the family understands that SNF may be warranted and that Pt was at [facility] in the past; the family does not want him to return there.  CSW thanked Pt and his family for their time.  Per RN, Pt awaiting cardiac test to determine if Pt is a surgical candidate.  Pt did pass the NST.  CSW to continue to follow.  Providence Crosby, LCSWA Clinical Social Work [phone]

## 2012-03-19 ENCOUNTER — Encounter (HOSPITAL_COMMUNITY)
Admission: AD | Disposition: A | Discharge status: Skilled Nursing Facility | Payer: Self-pay | Source: Ambulatory Visit | Attending: Internal Medicine

## 2012-03-19 DIAGNOSIS — I10 Essential (primary) hypertension: Secondary | ICD-10-CM

## 2012-03-19 DIAGNOSIS — I6350 Cerebral infarction due to unspecified occlusion or stenosis of unspecified cerebral artery: Secondary | ICD-10-CM

## 2012-03-19 DIAGNOSIS — E119 Type 2 diabetes mellitus without complications: Secondary | ICD-10-CM

## 2012-03-19 HISTORY — PX: LEFT HEART CATHETERIZATION WITH CORONARY ANGIOGRAM: SHX5451

## 2012-03-19 LAB — GLUCOSE, CAPILLARY
Glucose-Capillary: 136 mg/dL — ABNORMAL HIGH (ref 70–99)
Glucose-Capillary: 96 mg/dL (ref 70–99)

## 2012-03-19 LAB — PROTIME-INR: INR: 1.27 (ref 0.00–1.49)

## 2012-03-19 SURGERY — LEFT HEART CATHETERIZATION WITH CORONARY ANGIOGRAM
Anesthesia: LOCAL

## 2012-03-19 MED ORDER — HEPARIN (PORCINE) IN NACL 2-0.9 UNIT/ML-% IJ SOLN
INTRAMUSCULAR | Status: AC
  Filled 2012-03-19: qty 2000

## 2012-03-19 MED ORDER — SODIUM CHLORIDE 0.9 % IV SOLN
INTRAVENOUS | Status: AC
  Administered 2012-03-19: 15:00:00 via INTRAVENOUS

## 2012-03-19 MED ORDER — LIDOCAINE HCL (PF) 1 % IJ SOLN
INTRAMUSCULAR | Status: AC
  Filled 2012-03-19: qty 30

## 2012-03-19 MED ORDER — SODIUM CHLORIDE 0.9 % IV SOLN: 250.0000 mL | INTRAVENOUS | Status: DC | PRN

## 2012-03-19 MED ORDER — DIAZEPAM 5 MG PO TABS
5.0000 mg | ORAL_TABLET | ORAL | Status: AC
  Administered 2012-03-19: 5 mg via ORAL
  Filled 2012-03-19: qty 1

## 2012-03-19 MED ORDER — ASPIRIN 81 MG PO CHEW
324.0000 mg | CHEWABLE_TABLET | ORAL | Status: AC
  Administered 2012-03-19: 324 mg via ORAL
  Filled 2012-03-19: qty 4

## 2012-03-19 MED ORDER — ACETAMINOPHEN 325 MG PO TABS: 650.0000 mg | ORAL_TABLET | ORAL | Status: DC | PRN

## 2012-03-19 MED ORDER — ONDANSETRON HCL 4 MG/2ML IJ SOLN: 4.0000 mg | Freq: Four times a day (QID) | INTRAMUSCULAR | Status: DC | PRN

## 2012-03-19 MED ORDER — SODIUM CHLORIDE 0.9 % IV SOLN
INTRAVENOUS | Status: DC
  Administered 2012-03-19: 02:00:00 via INTRAVENOUS

## 2012-03-19 MED ORDER — NITROGLYCERIN 0.2 MG/ML ON CALL CATH LAB
INTRAVENOUS | Status: AC
  Filled 2012-03-19: qty 1

## 2012-03-19 MED ORDER — SODIUM CHLORIDE 0.9 % IV SOLN: INTRAVENOUS | Status: DC

## 2012-03-19 MED ORDER — SODIUM CHLORIDE 0.9 % IJ SOLN: 3.0000 mL | INTRAMUSCULAR | Status: DC | PRN

## 2012-03-19 MED ORDER — ASPIRIN 81 MG PO CHEW
81.0000 mg | CHEWABLE_TABLET | Freq: Every day | ORAL | Status: DC
  Administered 2012-03-21 – 2012-03-28 (×7): 81 mg via ORAL
  Filled 2012-03-19 (×6): qty 1

## 2012-03-19 MED ORDER — SODIUM CHLORIDE 0.9 % IJ SOLN: 3.0000 mL | Freq: Two times a day (BID) | INTRAMUSCULAR | Status: DC

## 2012-03-19 NOTE — Cardiovascular Report (Signed)
[name], [name]             ACCOUNT NO.:  [id number]  MEDICAL RECORD NO.:  [id number]  LOCATION:  3710                         FACILITY:  [facility]  PHYSICIAN:  Eduardo Osier. Sharyn Lull, M.D. DATE OF BIRTH:  01/09/1941  DATE OF PROCEDURE:  03/19/2012 DATE OF DISCHARGE:                           CARDIAC CATHETERIZATION   PROCEDURES:  Left cardiac catheterization with selective left and right coronary angiography, visualization of saphenous vein graft, left internal mammary artery graft, free radial artery graft to right coronary artery, left ventriculography via right groin using Judkins technique.  INDICATION FOR THE PROCEDURE:  Mr. 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  Cardiology consultation was obtained for surgical clearance.  Due to multiple risk factors and prior MI, the patient underwent nuclear stress test on June 30, which showed a small area of reversible ischemia involving the apical segment of the anterior wall.  During stress test, the patient had prolonged ST depression lasting approximately 30 minutes in inferolateral leads, with normal EF of 61%.  Due to EKG changes and abnormal stress test and multiple risk factors, I discussed with family regarding left catheterization, possible PCI, its risks and benefits, i.e., death, MI, stroke, need for emergency CABG, local vascular complications, etc., and consented for PCI.  PROCEDURE:  After obtaining the informed consent, the patient was brought to the catheterization lab  and was placed on fluoroscopy table. Right groin was prepped and draped in usual fashion.  Xylocaine 1% was used for local anesthesia in the right groin.  With the help of thin wall needle, a 6-French arterial sheath was placed.  The sheath was aspirated and flushed.  A 6-French left Judkins catheter was advanced over the wire under fluoroscopic guidance up to the ascending aorta.  Wire was pulled out. The catheter was aspirated and connected to the Manifold.  Catheter was further advanced and engaged into left coronary ostium.  Multiple views of the left system were taken.  Catheter was disengaged and was pulled out over the wire and was replaced with right Judkins catheter, which was advanced over the wire under fluoroscopic guidance up to the ascending aorta.  Wire was pulled out.  The catheter was aspirated and connected to the Manifold. Catheter was further advanced and engaged into the right coronary ostium.  A single view of right coronary artery was obtained.  Catheter was disengaged and was engaged into saphenous vein graft to obtuse marginal.  Multiple views of this graft were taken.  Catheter was disengaged and was engaged into free radial artery graft to RCA.  Multiple views of this graft were taken.  Catheter was disengaged and was advanced up to left subclavian artery. Non-selective injection of the subclavian artery was done to visualize the LIMA.  This catheter was pulled out over the wire and was replaced  with a 6- French pigtail catheter, which was advanced over the wire under fluoroscopic guidance up to the ascending aorta.  Catheter was further advanced across the aortic valve into the LV.  LV pressures were recorded.  LV graft was done in 30-degree RAO position.  Post angiographic pressures were recorded from LV and then pullback pressures were recorded from the aorta.  There was no gradient across the aortic valve.  Pigtail catheter was pulled out over the  wire.  Sheaths were aspirated and flushed.  FINDINGS:  LV showed good LV systolic function, mild apical wall hypokinesia, EF of 55-60%.  Left main was patent.  [name] was 100% occluded beyond proximal portion.  Diagonal 1 was very, very small which was diffusely diseased.  Left circumflex has 30-40% proximal stenosis and then 70% multiple sequential mid and distal stenosis.  OM 1 is 100% occluded.  RCA is 100% occluded beyond the proximal portion.  The saphenous vein graft to OM 1 has 70-80% mid sequential stenosis. The graft appears to be degenerated with haziness with TIMI grade 3 distal flow.  Free radial artery graft is 100% occluded at the ostium.  LIMA to [name] is patent.  Left subclavian artery has long proximal tubular 80% stenosis.  The patient tolerated the procedure well.  There are no complications.  PLAN:  The patient will be at high risk for any operative procedures prior to fixing obtuse marginal and left circumflex lesion.  Also, the patient is very high risk of perioperative stroke in view of critical left subclavian stenosis.  We will discuss with family regarding various options of treatment.  The patient also very high risk of restenosis if we use long bare-metal stent in native circumflex and saphenous vein graft to OM1.     Eduardo Osier. Sharyn Lull, M.D.     MNH/MEDQ  D:  03/19/2012  T:  03/19/2012  Job:  [id number]

## 2012-03-19 NOTE — Care Management Note (Signed)
    Page 1 of 2   03/28/2012     9:40:35 AM   CARE MANAGEMENT NOTE 03/28/2012  Patient:  [name], [name]   Account Number:  [id number]  Date Initiated:  03/12/2012  Documentation initiated by:  DAVIS,RHONDA  Subjective/Objective Assessment:   pt with abd pain and new findings of colonic mass,hgb 7.1     Action/Plan:   lives at home   Anticipated DC Date:  03/18/2012   Anticipated DC Plan:  HOME/SELF CARE  In-house referral  NA      DC Planning Services  CM consult      Choice offered to / List presented to:             Status of service:  Completed, signed off Medicare Important Message given?  NA - LOS <3 / Initial given by admissions (If response is "NO", the following Medicare IM given date fields will be blank) Date Medicare IM given:   Date Additional Medicare IM given:    Discharge Disposition:  SKILLED NURSING FACILITY  Per UR Regulation:  Reviewed for med. necessity/level of care/duration of stay  If discussed at Long Length of Stay Meetings, dates discussed:   03/20/2012  03/28/2012    Comments:  03-28-12 0939 Tomi Bamberger, RN,BSN [phone] Pt plan for d/c today. Pt was discussed in the Long Length of Stay Meeting today.   03-20-12 1347 Tomi Bamberger, RN,BSN [phone] Pt is scheduled for surgery on 03-22-12. Pt is active with [facility]  for [facility] RN/PT/OT/ST/HHA.  If pt decide to go home orders will need to be resumed. CSW will also work with pt at d/c for possible SNF for short term rehab. Unsure of disposition until after surgery. CM will continue to monitor.   Tomi Bamberger, RN,BSN 03-20-12 Pt was discussed in Long Length of stay meeting today.   03-19-12 [address], [place] [phone] CM to f/u with disposition needs. Post cardiac cath today. Will f/u with plan for colectomy.   [id number] Earlene Plater, RN, BSN, CCM Chart and patient information reviewed no discharge needs present at this time. Case  Management [phone]   [id number] Earlene Plater, RN,  BSN, CCM No discharge needs present at time of this review. Case Management [phone]

## 2012-03-19 NOTE — CV Procedure (Signed)
Left cardiac cath report dictated on 03/19/2012 dictation number is [id number]

## 2012-03-19 NOTE — H&P (Signed)
  No change in the H&P 

## 2012-03-19 NOTE — Progress Notes (Signed)
Patient had cardiac catheterization today. No stents place.  Still needs colectomy.  Is the patient to be transferred back to [facility].  We need to clarify.  Marta Lamas. Gae Bon, MD, FACS [phone] [facility] Surgery

## 2012-03-19 NOTE — Progress Notes (Signed)
Subjective: Patient feeling ok, denies chest pain, dyspnea, or abdominal pain.  Objective: Filed Vitals:   03/18/12 2353 03/19/12 0040 03/19/12 0600 03/19/12 0801  BP: 122/64 112/71 116/58   Pulse: 84 74 72 70  Temp: 98.6 F (37 C) 98.3 F (36.8 C) 99 F (37.2 C)   TempSrc: Oral Oral    Resp: 18 18 16    Height:  5\' 5"  (1.651 m)    Weight:  67 kg (147 lb 11.3 oz)    SpO2: 94% 100% 98%    Weight change:    General: Alert, awake, oriented x3, in no acute distress.  HEENT: No bruits, no goiter.  Heart: Regular rate and rhythm, without murmurs, rubs, gallops.  Lungs: CTA, bilateral air movement.  Abdomen: Soft, nontender, nondistended, positive bowel sounds.  Neuro: Dysarthria. mild RUE weakness    Lab Results:  [facility] 03/18/12 0448  NA 137  K 3.7  CL 107  CO2 22  GLUCOSE 120*  BUN 14  CREATININE 0.85  CALCIUM 8.5  MG --  PHOS --   Basename 03/18/12 0448  WBC 6.9  NEUTROABS --  HGB 9.4*  HCT 28.7*  MCV 79.5  PLT 243    Micro Results: Recent Results (from the past 240 hour(s))  CULTURE, BLOOD (ROUTINE X 2)     Status: Normal   Collection Time   03/11/12  5:55 PM      Component Value Range Status Comment   Specimen Description BLOOD RIGHT ARM   Final    Special Requests BOTTLES DRAWN AEROBIC AND ANAEROBIC 10CC   Final    Culture  Setup Time 03/12/2012 01:32   Final    Culture NO GROWTH 5 DAYS   Final    Report Status 03/18/2012 FINAL   Final   CULTURE, BLOOD (ROUTINE X 2)     Status: Normal   Collection Time   03/11/12  6:05 PM      Component Value Range Status Comment   Specimen Description BLOOD RIGHT HAND   Final    Special Requests BOTTLES DRAWN AEROBIC AND ANAEROBIC 10CC   Final    Culture  Setup Time 03/12/2012 01:33   Final    Culture NO GROWTH 5 DAYS   Final    Report Status 03/18/2012 FINAL   Final     Studies/Results: No results found.  Medications: I have reviewed the patient's current medications.  1. Abdominal pain/Colonic mass:  Patient presents with approximately one week of altered bowel habit/constipation, left lower quadrant abdominal pain, and a low grade pyrexia>was empirically treated with amoxicillin. Ciprofloxacin/Flagyl started for poss. Diverticulitis on admit. Abdominal/pelvic CT scan showed findings most consistent with adenocarcinoma of the cecum/ascending colon, 22 x 46 x 23 mm soft tissue mass. Small but conspicuous mesenteric lymph node nearby measuring 5 mm short axis. No liver or distant metastatic disease identified in the abdomen pelvis. Dr Lina Sar saw pt, and colonoscopy was done 6/26 - mass in ascending colon. and CEA Of 6/25 3.2.  -Surgery following, awaiting cardiac clearance  -Patient had Cath today awaiting result and Dr Sharyn Lull recommendation.   2. Dehydration: Patient had elevated BUN/Creatinine ratio, consistent with dehydration, on presentation. This has now resolved with iv fluids.  3. Anemia, Fe def: Patient has a normocytic anemia, which appeared mild at presentation at 9.4 . hemoglobin dropped to 7.1 and he was transfused  -hgb improved s/p transfusion, stable. Repeat HB in am.   4. CAD (coronary artery disease)/+stress test: Patient has a known  history of CAD, s/p MI,s/p CABG. He appears stable, chest pain free  -awaiting cath For preop clearance  - plavix on hold, continue B blockers  5. HTN (hypertension): BP meds on hold 2/2 hoypotension  6. COPD (chronic obstructive pulmonary disease): Stable/aymptomatic.  7. H/O: CVA (cerebrovascular accident): Stable.  8. Diabetes mellitus: -continue ssi 9.Hypokalemia;  Resolved  10.Hypotension ; Resolved, hemoglobin remaining stable      LOS: 8 days   REGALADO,BELKYS M.D.  [facility] 03/19/2012, 12:32 PM

## 2012-03-19 NOTE — Progress Notes (Signed)
Pt. Showing  bigeminy/trigeminy on telemetry. Pt. Asymptomatic, VS stable, sitting on side of bed eating snack. MD notified. No new orders received. Pt. To be transferred to [id number] via [facility] Report called to RN on 3700. Pts. Daughter contacted, no answer. Message left via voicemail of pts. Transfer. Pts. Glasses, lens cleanser, and flower sent. Elanah Osmanovic, Cheryll Dessert

## 2012-03-19 NOTE — Progress Notes (Signed)
CSW met with pt at bedside to discuss pt current living environment and pt discharge plans. Pt was difficult to understand and pt began to cry. Pt states that he wants to go home and not interested in skilled nursing. CSW provided emotional support. CSW and Pt discussed pt wife and daughter which seemed to help calm patient.   CSW will follow up with pt after surgery and physical therapy re-evaluates. Per chart review, pt plans to dc home with spouse who has been providing patient care. Pt currently is recommended for home health however there are concerns regarding pt deconditioning after surgery.   .Clinical social worker continuing to follow pt to assist with pt dc plans and further csw needs.   Catha Gosselin, Theresia Majors  [phone] .03/19/2012 1642pm

## 2012-03-19 NOTE — Progress Notes (Signed)
UR Completed [place] Thiam Graves-Bigelow, RN,BSN [phone]  

## 2012-03-20 DIAGNOSIS — N179 Acute kidney failure, unspecified: Secondary | ICD-10-CM

## 2012-03-20 DIAGNOSIS — D126 Benign neoplasm of colon, unspecified: Secondary | ICD-10-CM

## 2012-03-20 LAB — GLUCOSE, CAPILLARY
Glucose-Capillary: 140 mg/dL — ABNORMAL HIGH (ref 70–99)
Glucose-Capillary: 133 mg/dL — ABNORMAL HIGH (ref 70–99)

## 2012-03-20 LAB — BASIC METABOLIC PANEL
BUN: 13 mg/dL (ref 6–23)
Chloride: 106 mEq/L (ref 96–112)
GFR calc Af Amer: >90 mL/min (ref >90)
Glucose, Bld: 121 mg/dL — ABNORMAL HIGH (ref 70–99)
Potassium: 3.8 mEq/L (ref 3.5–5.1)

## 2012-03-20 LAB — HEMOGLOBIN AND HEMATOCRIT, BLOOD
HCT: 31.2 % — ABNORMAL LOW (ref 39.0–52.0)
Hemoglobin: 10 g/dL — ABNORMAL LOW (ref 13.0–17.0)

## 2012-03-20 LAB — CBC
HCT: 29.6 % — ABNORMAL LOW (ref 39.0–52.0)
Hemoglobin: 9.5 g/dL — ABNORMAL LOW (ref 13.0–17.0)
WBC: 5.5 10*3/uL (ref 4.0–10.5)

## 2012-03-20 LAB — POCT ACTIVATED CLOTTING TIME: Activated Clotting Time: 144 seconds

## 2012-03-20 MED ORDER — TAMSULOSIN HCL 0.4 MG PO CAPS
0.4000 mg | ORAL_CAPSULE | Freq: Every day | ORAL | Status: DC
  Administered 2012-03-20 – 2012-03-28 (×8): 0.4 mg via ORAL
  Filled 2012-03-20 (×10): qty 1

## 2012-03-20 MED ORDER — METOPROLOL TARTRATE 12.5 MG HALF TABLET
12.5000 mg | ORAL_TABLET | Freq: Two times a day (BID) | ORAL | Status: DC
  Administered 2012-03-20: 12.5 mg via ORAL
  Filled 2012-03-20 (×2): qty 1

## 2012-03-20 MED ORDER — SODIUM CHLORIDE 0.9 % IV SOLN: Freq: Once | INTRAVENOUS | Status: DC

## 2012-03-20 MED ORDER — NEOMYCIN SULFATE 500 MG PO TABS
1000.0000 mg | ORAL_TABLET | Freq: Four times a day (QID) | ORAL | Status: AC
  Administered 2012-03-21 – 2012-03-22 (×4): 1000 mg via ORAL
  Filled 2012-03-20 (×4): qty 2

## 2012-03-20 MED ORDER — SODIUM CHLORIDE 0.9 % IV SOLN: INTRAVENOUS | Status: AC

## 2012-03-20 MED ORDER — MAGNESIUM CITRATE PO SOLN
1.0000 | Freq: Once | ORAL | Status: AC
  Administered 2012-03-21: 1 via ORAL
  Filled 2012-03-20: qty 296

## 2012-03-20 MED ORDER — SODIUM CHLORIDE 0.9 % IV BOLUS (SEPSIS)
250.0000 mL | Freq: Once | INTRAVENOUS | Status: AC
  Administered 2012-03-20: 250 mL via INTRAVENOUS

## 2012-03-20 MED ORDER — ERYTHROMYCIN BASE 250 MG PO TABS
1000.0000 mg | ORAL_TABLET | Freq: Four times a day (QID) | ORAL | Status: AC
  Administered 2012-03-21 – 2012-03-22 (×4): 1000 mg via ORAL
  Filled 2012-03-20 (×4): qty 4

## 2012-03-20 NOTE — Progress Notes (Signed)
[facility] Progress Note 1 Day Post-Op  Subjective: Patient is having bowel movements on the floor.  Objective: Vital signs in last 24 hours: Temp:  [98.5 F (36.9 C)-98.7 F (37.1 C)] 98.6 F (37 C) (07/03 0500) Pulse Rate:  [61-74] 70  (07/03 0500) Resp:  [17-18] 17  (07/03 0500) BP: (126-145)/(52-72) 126/64 mmHg (07/03 0500) SpO2:  [94 %-99 %] 94 % (07/03 0500) Last BM Date: 03/20/12  Intake/Output from previous day: 07/02 0701 - 07/03 0700 In: -  Out: 2500 [Urine:2500] Intake/Output this shift:    General:No distress  Lungs: Clear  Abd: Soft, non-distended.  Extremities: No DVT  Neuro: Past CVA  Lab Results:  @LABLAST2 (wbc:2,hgb:2,hct:2,plt:2) BMET  Basename 03/20/12 0500 03/18/12 0448  NA 140 137  K 3.8 3.7  CL 106 107  CO2 25 22  GLUCOSE 121* 120*  BUN 13 14  CREATININE 0.97 0.85  CALCIUM 8.8 8.5   PT/INR  Basename 03/19/12 0525  LABPROT 16.2*  INR 1.27   ABG No results found for this basename: PHART:2,PCO2:2,PO2:2,HCO3:2 in the last 72 hours  Studies/Results: No results found.  Anti-infectives: Anti-infectives     Start     Dose/Rate Route Frequency Ordered Stop   03/11/12 2000   ciprofloxacin (CIPRO) IVPB 400 mg        400 mg 200 mL/hr over 60 Minutes Intravenous Every 12 hours 03/11/12 1711     03/11/12 2000   metroNIDAZOLE (FLAGYL) IVPB 500 mg        500 mg 100 mL/hr over 60 Minutes Intravenous Every 8 hours 03/11/12 1711            Assessment/Plan: s/p Procedure(s): LEFT HEART CATHETERIZATION WITH CORONARY ANGIOGRAM Bowel prep tomorrow including oral antibiotics Surgery for right hemicolectomy on Friday.  Will come back to speak with the family later today.  LOS: 9 days   Marta Lamas. Gae Bon, MD, FACS [phone] [facility] Surgery 03/20/2012

## 2012-03-20 NOTE — Progress Notes (Signed)
[facility]                                                                                Patient Demographics  [name], is a 71 y.o. male  WUJ:[id number]  [id number]  DOB - [birth year]  Admit date - 03/11/2012  Admitting Physician Laveda Norman, MD  Outpatient Primary MD for the patient is Delorse Lek, MD  LOS - 9   No chief complaint on file.       Subjective:   [name] today has, No headache, No chest pain, No abdominal pain - No Nausea, No new weakness tingling or numbness, No Cough - SOB.    Objective:   Filed Vitals:   03/19/12 1400 03/19/12 1445 03/19/12 2100 03/20/12 0500  BP: 128/61 138/65 137/58 126/64  Pulse: 69 69 71 70  Temp: 98.5 F (36.9 C)  98.7 F (37.1 C) 98.6 F (37 C)  TempSrc: Oral  Oral Oral  Resp: 18  18 17   Height:      Weight:      SpO2: 98%  99% 94%    Wt Readings from Last 3 Encounters:  03/19/12 67 kg (147 lb 11.3 oz)  03/19/12 67 kg (147 lb 11.3 oz)  03/19/12 67 kg (147 lb 11.3 oz)     Intake/Output Summary (Last 24 hours) at 03/20/12 0843 Last data filed at 03/20/12 0500  Gross per 24 hour  Intake      0 ml  Output   2500 ml  Net  -2500 ml    Exam Awake Alert,  No new F.N deficits, Normal affect [name].AT,PERRAL Supple Neck,No JVD, No cervical lymphadenopathy appriciated.  Symmetrical Chest wall movement, Good air movement bilaterally, CTAB RRR,No Gallops,Rubs or new Murmurs, No Parasternal Heave +ve B.Sounds, Abd Soft, Non tender, No organomegaly appriciated, No rebound -guarding or rigidity. No Cyanosis, Clubbing or edema, No new Rash or bruise     Data Review  CBC  Lab 03/20/12 0500 03/18/12 0448 03/16/12 0610 03/14/12 0515  WBC 5.5 6.9 7.8 7.4  HGB 9.5* 9.4* 9.9* 9.3*  HCT 29.6* 28.7* 30.7* 30.3*  PLT 230 243 234 225  MCV 80.2 79.5 80.4 81.9  MCH 25.7* 26.0 25.9* 25.1*  MCHC 32.1 32.8 32.2 30.7  RDW 15.5 15.4 15.5 15.1  LYMPHSABS -- -- -- --  MONOABS -- -- -- --  EOSABS  -- -- -- --  BASOSABS -- -- -- --  BANDABS -- -- -- --    Chemistries   Lab 03/20/12 0500 03/18/12 0448 03/16/12 0610 03/15/12 0501 03/14/12 0515  NA 140 137 135 137 137  K 3.8 3.7 3.8 3.7 3.4*  CL 106 107 106 108 105  CO2 25 22 21 23 23   GLUCOSE 121* 120* 140* 146* 136*  BUN 13 14 13 13 13   CREATININE 0.97 0.85 0.86 0.89 0.93  CALCIUM 8.8 8.5 8.7 8.3* 8.2*  MG -- -- -- -- --  AST -- -- -- -- --  ALT -- -- -- -- --  ALKPHOS -- -- -- -- --  BILITOT -- -- -- -- --   ------------------------------------------------------------------------------------------------------------------ estimated creatinine clearance is 61.6 ml/min (by C-G formula based  on Cr of 0.97). ------------------------------------------------------------------------------------------------------------------ No results found for this basename: HGBA1C:2 in the last 72 hours ------------------------------------------------------------------------------------------------------------------ No results found for this basename: CHOL:2,HDL:2,LDLCALC:2,TRIG:2,CHOLHDL:2,LDLDIRECT:2 in the last 72 hours ------------------------------------------------------------------------------------------------------------------ No results found for this basename: TSH,T4TOTAL,FREET3,T3FREE,THYROIDAB in the last 72 hours ------------------------------------------------------------------------------------------------------------------ No results found for this basename: VITAMINB12:2,FOLATE:2,FERRITIN:2,TIBC:2,IRON:2,RETICCTPCT:2 in the last 72 hours  Coagulation profile  Lab 03/19/12 0525  INR 1.27  PROTIME --    No results found for this basename: DDIMER:2 in the last 72 hours  Cardiac Enzymes No results found for this basename: CK:3,CKMB:3,TROPONINI:3,MYOGLOBIN:3 in the last 168 hours ------------------------------------------------------------------------------------------------------------------ No components found with this  basename: POCBNP:3  Micro Results Recent Results (from the past 240 hour(s))  CULTURE, BLOOD (ROUTINE X 2)     Status: Normal   Collection Time   03/11/12  5:55 PM      Component Value Range Status Comment   Specimen Description BLOOD RIGHT ARM   Final    Special Requests BOTTLES DRAWN AEROBIC AND ANAEROBIC 10CC   Final    Culture  Setup Time 03/12/2012 01:32   Final    Culture NO GROWTH 5 DAYS   Final    Report Status 03/18/2012 FINAL   Final   CULTURE, BLOOD (ROUTINE X 2)     Status: Normal   Collection Time   03/11/12  6:05 PM      Component Value Range Status Comment   Specimen Description BLOOD RIGHT HAND   Final    Special Requests BOTTLES DRAWN AEROBIC AND ANAEROBIC 10CC   Final    Culture  Setup Time 03/12/2012 01:33   Final    Culture NO GROWTH 5 DAYS   Final    Report Status 03/18/2012 FINAL   Final     Radiology Reports Ct Abdomen Pelvis W Contrast  03/11/2012  *RADIOLOGY REPORT*  Clinical Data: 71 year old male with left lower quadrant abdominal pain.  History of kidney stones.  CT ABDOMEN AND PELVIS WITH CONTRAST  Technique:  Multidetector CT imaging of the abdomen and pelvis was performed following the standard protocol during bolus administration of intravenous contrast.  Contrast: 80mL OMNIPAQUE IOHEXOL 300 MG/ML  SOLN  Comparison: Abdominal radiographs 03/07/2012.  Findings: Dependent opacity and crowding of markings at the lung bases compatible with atelectasis.  Mild cardiomegaly.  No pericardial or pleural effusion.  Sequelae median sternotomy. Degenerative changes in the spine. No acute osseous abnormality identified.  No pelvic free fluid.  The bladder is distended and yet there is evidence of bladder wall thickening.  No perivesical stranding. The prostate is enlarged and lobulated and there is mild contour deformity at the base of the bladder.  Negative distal colon aside from retained stool.  Oral contrast has reached the splenic flexure.  No wall thickening or  inflammation of the left colon.  Contrast mixed with stool throughout the more proximal colon.  There is a the crescent-shaped fungating soft tissue mass arising in the posterior wall of the right colon on series 5 image 30 measuring 22 x 46 x 23 mm (AP by transverse by CC, coronal image 45, sagittal image 20).  A conspicuous but small lymph node in the adjacent mesentery measures 5 mm in short axis (series 2 image 52). No mesenteric lymphadenopathy.  This lesion is nonobstructing.  The lesion is only 1 to 2 cm distal to the ileocecal valve which is best demonstrated on coronal image 33. Distal small bowel loops are nondilated.  There may be mild distal small bowel wall thickening not involving  the terminal ileum in the right lower quadrant (series 2 image 66) but this may be artifact of peristalsis.  No dilated small bowel.  Negative stomach, duodenum, spleen, pancreas, and adrenal glands.  No liver mass identified.  Dependent cholelithiasis.  No pericholecystic inflammation. Multiple low-density areas in both kidneys probably are benign cyst.  There is a 7-8 mm calculus located in the right renal pelvis but not causing obstruction at this time.  No hydronephrosis or hydroureter.  Mild perinephric stranding may be age related.  No abdominal free fluid or lymphadenopathy.  Atherosclerosis of the abdominal aorta and iliac arteries.  IMPRESSION: 1.  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 22 x 46 x 23 mm soft tissue mass.  Small but conspicuous mesenteric lymph node nearby measuring 5 mm short axis.  No liver or distant metastatic disease identified in the abdomen pelvis. 2.  Kidney stone at the right renal pelvis but without associated obstruction at this time. 3.  The bladder is distended with evidence of wall thickening.  The prostate is enlarged.  Chronic bladder outlet obstruction suspected and urology follow up to include prostate  evaluation recommended. 4.  Cholelithiasis.  Aortic atherosclerosis.  Salient findings discussed by telephone with provider Maren Reamer on 03/11/2012 at  Original Report Authenticated By: Harley Hallmark, M.D.   Nm Myocar Multi W/spect W/wall Motion / Ef  03/17/2012  *RADIOLOGY REPORT*  Clinical Data:  Chest pain  Technique:  Standard myocardial SPECT imaging performed after resting intravenous injection of 10 mCi Tc-17m tetrofosmin . Subsequently, intravenous infusion of Lexiscan performed under the supervision of the Cardiology staff.  At peak effect of the drug, 30 mCi Tc-61mtetrofosmin injected intravenously and standard myocardial SPECT imaging performed.  Quantitative gated imaging also performed to evaluate left ventricular wall motion and estimate left ventricular ejection fraction.  Comparison:  None  MYOCARDIAL IMAGING WITH SPECT (REST AND PHARMACOLOGIC-STRESS)  Findings:  There is a small area of mild reversibility involving the apical segment of the anterior wall.  The summed difference score is equal to one.  This likely reflects nonuniform soft tissue attenuation artifact.  GATED LEFT VENTRICULAR WALL MOTION STUDY  Findings:  Review of the gated images demonstrates moderate hypokinesis involving the mid and apical segments of the anterior septum is noted.  There is mild hypokinesis involving the basilar segments of the lateral wall.  Normal wall thickening.  LEFT VENTRICULAR EJECTION FRACTION  Findings:  QGS ejection fraction measures 61% , with an end- diastolic volume of 84 ml and an end-systolic volume of 33 ml.  IMPRESSION:  1. Small area of mild reversibility involving the apical segment of the anterior wall.  This has a summed difference score equal to one and likely reflects nonuniform soft tissue attenuation artifact. True myocardial ischemia is considered less favored.  2.  Anterior septal hypokinesis. 3.  Left ventricular ejection fraction equals 61%.  Original Report Authenticated By:  Rosealee Albee, M.D.    Scheduled Meds:   . aspirin  81 mg Oral Daily  . aspirin EC  81 mg Oral Daily  . ciprofloxacin  400 mg Intravenous Q12H  . ezetimibe-simvastatin  1 tablet Oral QHS  . feeding supplement  1 Container Oral BID BM  . insulin aspart  0-5 Units Subcutaneous QHS  . insulin aspart  0-9 Units Subcutaneous TID WC  . LORazepam  2 mg Oral QHS  . metronidazole  500 mg Intravenous Q8H  .  multivitamin with minerals  1 tablet Oral Daily  . nitroGLYCERIN  0.2 mg Transdermal Daily  . potassium chloride  40 mEq Oral Once  . sodium chloride  250 mL Intravenous Once  . sodium chloride  3 mL Intravenous Q12H  . vitamin C  500 mg Oral BID  . zinc sulfate  220 mg Oral Daily  . DISCONTD: sodium chloride  3 mL Intravenous Q12H   Continuous Infusions:   . sodium chloride 150 mL/hr at 03/19/12 1445  . sodium chloride    . DISCONTD: sodium chloride 75 mL/hr at 03/18/12 1645  . DISCONTD: sodium chloride 75 mL/hr at 03/19/12 0201  . DISCONTD: sodium chloride     PRN Meds:.acetaminophen, acetaminophen, albuterol, HYDROmorphone (DILAUDID) injection, ipratropium, LORazepam, nitroGLYCERIN, ondansetron (ZOFRAN) IV, ondansetron, oxyCODONE, DISCONTD: sodium chloride, DISCONTD: acetaminophen, DISCONTD: ondansetron (ZOFRAN) IV, DISCONTD: sodium chloride  Assessment & Plan   Assuming care of the patient on 03/20/2012 on day 9 of his hospital stay.    1. Abdominal pain/Colonic mass: Patient presents with approximately one week of altered bowel habit/constipation, left lower quadrant abdominal pain, and a low grade pyrexia>was empirically treated with amoxicillin. Ciprofloxacin/Flagyl started for poss. Diverticulitis on admit. Abdominal/pelvic CT scan showed findings most consistent with adenocarcinoma of the cecum/ascending colon, 22 x 46 x 23 mm soft tissue mass. Small but conspicuous mesenteric lymph node nearby measuring 5 mm short axis. No liver or distant metastatic disease identified in  the abdomen pelvis. Dr Lina Sar saw pt, and colonoscopy was done 6/26 - mass in ascending colon. and CEA Of 6/25 3.2.   -Surgery following.  -Patient is status post left heart catheterization by Dr. Sharyn Lull, discussed his case with him on 03/20/2012, patient has multi-vessel stenosis, currently no plan to place stent, cardiologist has discussed all the options with patient and family, plan is to proceed with surgery patient will be a high-risk candidate for adverse cardiopulmonary outcome during surgery. This is acceptable to patient and family. We will keep the patient at [facility] for the rest of his hospital stay.  - Patient has been on Cipro and Flagyl since his hospital admission, if okay by surgery we'll discontinue can get perioperative prophylaxis per surgery.     2. Dehydration: This has now resolved with iv fluids.      3. Anemia, Fe def: Patient has a normocytic anemia, which appeared mild at presentation at 9.4 . hemoglobin dropped to 7.1 and he had been transfused earlier in his hospital stay. hgb improved s/p transfusion, stable.      4. CAD (coronary artery disease)/+stress test: Patient has a known history of CAD, s/p MI,s/p CABG. stress test positive, left heart catheterization done on 03-2012 suggestive of multivessel stenosis, plan is to proceed for surgery with the knowledge that patient will be high-risk candidate for adverse cardiopulmonary outcome. Patient not a good candidate for bare-metal stent as the stenosis is long, drug-eluting stent with Plavix once abdominal surgeries.     5. HTN (hypertension): BP meds on hold - pressure stable will introduce low-dose beta blocker if tolerated by patient.     6. COPD (chronic obstructive pulmonary disease): Stable/aymptomatic.     7. H/O: CVA (cerebrovascular accident): Stable. On low-dose aspirin.    8. Diabetes mellitus: -continue ssi  CBG (last 3)   Basename 03/20/12 0735 03/19/12 2035  03/19/12 1748  GLUCAP 151* 96 136*    9. Urinary retention which happened earlier during his hospital stay  Patient remains on Foley, will place  him on low-dose Flomax try to remove Foley in couple of days.     DVT Prophylaxis   SCDs   Procedures CT scan abdomen pelvis, Cardiolite stress test, left heart catheterization  Consults Gen. surgery, cardiology   Leroy Sea M.D on 03/20/2012 at 8:43 AM  Between 7am to 7pm - Pager - [phone]  After 7pm go to [email or web address] - password [email or web address]  And look for the night coverage person covering for me after hours  [facility] Office  [phone]

## 2012-03-20 NOTE — Progress Notes (Signed)
03-20-12 Pt was discussed in Long Length of stay meeting today.

## 2012-03-20 NOTE — Progress Notes (Signed)
Subjective:  Patient denies any chest pain or shortness of breath up in chair eating. Underwent left cardiac cath yesterday tolerated the procedure well noted to have critical stenosis in saphenous vein graft to obtuse marginal and native left circumflex which was not bypassed. Patient will require long drug-eluting stents and dual antiplatelet medication for one year. 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 Patient presented with lower GI bleeding anemia noted to have adenocarcinoma of colon. Patient high-risk for bleeding even if he had bare metal stents which will require at least one month off and dual antiplatelet agents with very high risk of restenosis. Discussed with daughter at length various options of treatment i.e. proceeding with high risk abdominal surgery first and then proceeding with PCI to left circumflex/obtuse marginal and subclavian artery and future after surgery. And agrees for above. Patient high-risk for perioperative MI patient's family understand. Will avoid hypotension perioperatively.  Objective:  Vital Signs in the last 24 hours: Temp:  [98.5 F (36.9 C)-98.7 F (37.1 C)] 98.6 F (37 C) (07/03 0500) Pulse Rate:  [69-74] 72  (07/03 1001) Resp:  [17-18] 17  (07/03 0500) BP: (124-138)/(58-74) 124/74 mmHg (07/03 1001) SpO2:  [94 %-99 %] 94 % (07/03 0500)  Intake/Output from previous day: 07/02 0701 - 07/03 0700 In: -  Out: 2500 [Urine:2500] Intake/Output from this shift:    Physical Exam: Neck: no adenopathy, no carotid bruit, no JVD and supple, symmetrical, trachea midline Lungs: clear to auscultation bilaterally Heart: regular rate and rhythm, S1, S2 normal, no murmur, click, rub or gallop Abdomen: soft, non-tender; bowel sounds normal; no masses,  no organomegaly Extremities: extremities normal, atraumatic, no cyanosis or edema and Right groin stable  Lab Results:  Basename 03/20/12 0500 03/18/12 0448  WBC  5.5 6.9  HGB 9.5* 9.4*  PLT 230 243    Basename 03/20/12 0500 03/18/12 0448  NA 140 137  K 3.8 3.7  CL 106 107  CO2 25 22  GLUCOSE 121* 120*  BUN 13 14  CREATININE 0.97 0.85   No results found for this basename: TROPONINI:2,CK,MB:2 in the last 72 hours Hepatic Function Panel No results found for this basename: PROT,ALBUMIN,AST,ALT,ALKPHOS,BILITOT,BILIDIR,IBILI in the last 72 hours No results found for this basename: CHOL in the last 72 hours No results found for this basename: PROTIME in the last 72 hours  Imaging: Imaging results have been reviewed and No results found.  Cardiac Studies:  Assessment/Plan:  Right colonic mass probable adenocarcinoma of colon  Coronary artery disease history of MI in the past status post CABG  Mildly positive Lexiscan Myoview although patient had prolonged ST depression during the stress portion of the test in inferolateral leads.  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

## 2012-03-20 NOTE — Progress Notes (Signed)
Physical Therapy Treatment Patient Details Name: [name] MRN: [id number] DOB: 1941/09/04 Today's Date: 03/20/2012 Time: 0850-0910 PT Time Calculation (min): 20 min  PT Assessment / Plan / Recommendation Comments on Treatment Session  Patient continues to require assistance for his balance. Noted that the original plan was for home health therapies however patient at high fall risk unless assistance can be provided with gait activity. He is also pending surgery and this may further decrease his strength. We will continue to evaluate as we follow him post-operatively but SNF may be the best option.     Follow Up Recommendations  Supervision/Assistance - 24 hour;Home health PT;Skilled nursing facility    Barriers to Discharge  None      Equipment Recommendations  Defer to next venue    Recommendations for Other Services  None  Frequency Min 3X/week   Plan Discharge plan remains appropriate;Frequency remains appropriate    Precautions / Restrictions Precautions Precautions: Fall   Pertinent Vitals/Pain VSS/ No pain    Mobility  Bed Mobility Supine to Sit: 5: Supervision Details for Bed Mobility Assistance: using Rail and sequencing task without cues. Pt HOB 30 degrees Transfers Sit to Stand: 4: Min guard;With upper extremity assist;From chair/3-in-1 Stand to Sit: To chair/3-in-1;With upper extremity assist;4: Min guard Details for Transfer Assistance: Patient with uncontrolled descent to chair secondary to no use of arm rests initially. Repeated task with upper extremity use. Hand over hand for correct use with sit to stand.  Ambulation/Gait Ambulation/Gait Assistance: 4: Min assist Ambulation Distance (Feet): 150 Feet Assistive device: Rolling walker Ambulation/Gait Assistance Details: Patient requires intermittent assistance with gait for balance. Secondary to decreased right grip patient has a tendency to have difficulty steering the walker and veers to the right sdie  within the device. He also had 2 episodes of loss of balance to the right which required PT assistance to correct for patient. With facilitation at right side of the trunk then patient able to prevent loss of balance to the right side.  Gait Pattern: Step-through pattern;Decreased stride length;Lateral trunk lean to right     PT Goals Acute Rehab PT Goals PT Goal: Sit to Stand - Progress: Progressing toward goal PT Goal: Ambulate - Progress: Progressing toward goal  Visit Information  Last PT Received On: 03/20/12 Assistance Needed: +1    Subjective Data  Subjective: Patient reports tired this am   Cognition  Overall Cognitive Status: Impaired Area of Impairment: Problem solving Difficult to assess due to: Impaired communication (expressive aphasia) Arousal/Alertness: Awake/alert Orientation Level: Appears intact for tasks assessed Behavior During Session: [facility] for tasks performed Problem Solving: use of phone and call bell required v/c from OT Cognition - Other Comments: pt tearful a couple of times.      Balance  Dynamic Standing Balance Dynamic Standing - Balance Support: Bilateral upper extremity supported Dynamic Standing - Level of Assistance: 4: Min assist  End of Session PT - End of Session Equipment Utilized During Treatment: Gait belt Activity Tolerance: Patient limited by fatigue Patient left: in chair;with call bell/phone within reach;with chair alarm set Nurse Communication: Mobility status   Edwyna Perfect, PT  Pager [phone]  03/20/2012, 9:50 AM

## 2012-03-20 NOTE — Clinical Social Work Placement (Addendum)
     Clinical Social Work Department CLINICAL SOCIAL WORK PLACEMENT NOTE 03/28/2012  Patient:  [name], [name]  Account Number:  [id number] Admit date:  03/11/2012  Clinical Social Worker:  Doree Albee  Date/time:  03/20/2012 02:00 PM  Clinical Social Work is seeking post-discharge placement for this patient at the following level of care:   SKILLED NURSING   (*CSW will update this form in Epic as items are completed)   03/20/2012  Patient/family provided with [facility] Department of Clinical Social Works list of facilities offering this level of care within the geographic area requested by the patient (or if unable, by the patients family).  03/20/2012  Patient/family informed of their freedom to choose among providers that offer the needed level of care, that participate in Medicare, Medicaid or managed care program needed by the patient, have an available bed and are willing to accept the patient.  03/20/2012  Patient/family informed of [facility] ownership interest in [facility], as well as of the fact that they are under no obligation to receive care at this facility.  PASARR submitted to EDS on 03/20/2012 PASARR number received from EDS on 03/20/2012  FL2 transmitted to all facilities in geographic area requested by pt/family on  03/20/2012 FL2 transmitted to all facilities within larger geographic area on   Patient informed that his/her managed care company has contracts with or will negotiate with  certain facilities, including the following:     Patient/family informed of bed offers received:  03/22/2012 Patient chooses bed at [facility] Physician recommends and patient chooses bed at    Patient to be transferred to [facility] on  03/28/2012 Patient to be transferred to facility by ptar  The following physician request were entered in Epic:   Additional Comments:

## 2012-03-20 NOTE — Progress Notes (Signed)
Notified Dr. Sharyn Lull of Sys BP 88/. Stated ok to remove Ntg patch and to give 250 bolus over 2 hrs. Pt asymptomatic, just feels tired.

## 2012-03-20 NOTE — Progress Notes (Signed)
CSW spoke with pt family by phone to discuss pt progress and pt discharge plans. Pt family shared that pt is scheduled for surgery on Friday. Per discussion with pt family, pt MD discussed the risks of surgery and pt family understands pt may not survive operation. CSW provided emotional support by phone.   CSW and pt family also discussed rehab being recommended for patient after surgery. Pt family agreed and requested csw to initiate snf placement search. Pt family states pt is aware he will need rehab but pt does not want to return to [facility]. Pt family shared that pt has been tearful due to patient understanding of risks of surgery and the need for pt to return to rehab.   Per discussion with family, csw will wait to discuss bed offers after surgery if appropriate with patient due to sensitivity to subject.   Please see placement note for progress of placement. Pt family already provided with skilled nursing facility list.   .Doree Albee  [phone] .03/20/2012 1354pm

## 2012-03-20 NOTE — Progress Notes (Signed)
1 Day Post-Op  Subjective: Sitting up at bedside, reading paper, appears to be in no acute distress. No current c/o of any abdominal pain. Nurse reports that patient gets very tearful at times.  Objective: Vital signs in last 24 hours: Temp:  [98.5 F (36.9 C)-98.7 F (37.1 C)] 98.6 F (37 C) (07/03 0500) Pulse Rate:  [61-74] 70  (07/03 0500) Resp:  [17-18] 17  (07/03 0500) BP: (126-145)/(52-72) 126/64 mmHg (07/03 0500) SpO2:  [94 %-99 %] 94 % (07/03 0500) Last BM Date: 03/20/12  Intake/Output from previous day: 07/02 0701 - 07/03 0700 In: -  Out: 2500 [Urine:2500] Intake/Output this shift:    General appearance: alert, cooperative and no distress. Good eye contact. He remains afebrile. Abdomen: soft, non tender, no rebound tenderness, +BS Foley in place  Chest: CTA, denies any CP, or S.O.B. Extremities: no edema, no s/s of DVT  Lab Results:   Basename 03/20/12 0500 03/18/12 0448  WBC 5.5 6.9  HGB 9.5* 9.4*  HCT 29.6* 28.7*  PLT 230 243   BMET  Basename 03/20/12 0500 03/18/12 0448  NA 140 137  K 3.8 3.7  CL 106 107  CO2 25 22  GLUCOSE 121* 120*  BUN 13 14  CREATININE 0.97 0.85  CALCIUM 8.8 8.5   PT/INR  Basename 03/19/12 0525  LABPROT 16.2*  INR 1.27   ABG No results found for this basename: PHART:2,PCO2:2,PO2:2,HCO3:2 in the last 72 hours  Studies/Results: No results found.  Anti-infectives: Anti-infectives     Start     Dose/Rate Route Frequency Ordered Stop   03/11/12 2000   ciprofloxacin (CIPRO) IVPB 400 mg        400 mg 200 mL/hr over 60 Minutes Intravenous Every 12 hours 03/11/12 1711     03/11/12 2000   metroNIDAZOLE (FLAGYL) IVPB 500 mg        500 mg 100 mL/hr over 60 Minutes Intravenous Every 8 hours 03/11/12 1711            Assessment/Plan: s/p Procedure(s) (LRB): LEFT HEART CATHETERIZATION WITH CORONARY ANGIOGRAM (N/A) 1. Continue with clear liquids for now. (will begin bowel prep once patient is cleared by cardiology for  surgery) 2. Await Cardiac cath report results. 3. ? Transfer back to [facility]  LOS: 9 days    Bernardino Dowell 03/20/2012

## 2012-03-20 NOTE — Progress Notes (Signed)
Plan OR for Friday.  Spoke with the family  Marta Lamas. Gae Bon, MD, FACS [phone] [facility]

## 2012-03-20 NOTE — Progress Notes (Signed)
Nutrition Follow-up  Intervention:    Continue Resource Breeze supplement 2 times daily between meals to maximize kcal, protein intake  RD to follow for nutrition care plan  Patient transferred from [facility]. S/p colonoscopy 6/26 -- mass in ascending colon. S/p cardiac cath 7/2 for pre-op clearance. Patient states his appetite is poor, however, PO intake 75-100% per flowsheet records. Plan is for right hemicolectomy on 7/5.  Diet Order:  Clear Liquids  Meds: Scheduled Meds:   . aspirin  81 mg Oral Daily  . aspirin EC  81 mg Oral Daily  . erythromycin  1,000 mg Oral Q6H  . ezetimibe-simvastatin  1 tablet Oral QHS  . feeding supplement  1 Container Oral BID BM  . insulin aspart  0-5 Units Subcutaneous QHS  . insulin aspart  0-9 Units Subcutaneous TID WC  . LORazepam  2 mg Oral QHS  . magnesium citrate  1 Bottle Oral Once  . metoprolol tartrate  12.5 mg Oral BID  . multivitamin with minerals  1 tablet Oral Daily  . neomycin  1,000 mg Oral Q6H  . nitroGLYCERIN  0.2 mg Transdermal Daily  . potassium chloride  40 mEq Oral Once  . sodium chloride  250 mL Intravenous Once  . sodium chloride  3 mL Intravenous Q12H  . Tamsulosin HCl  0.4 mg Oral QPC breakfast  . vitamin C  500 mg Oral BID  . zinc sulfate  220 mg Oral Daily  . DISCONTD: ciprofloxacin  400 mg Intravenous Q12H  . DISCONTD: metronidazole  500 mg Intravenous Q8H   Continuous Infusions:   . sodium chloride 150 mL/hr at 03/19/12 1445  . sodium chloride Stopped (03/20/12 0845)  . DISCONTD: sodium chloride 75 mL/hr at 03/18/12 1645   PRN Meds:.acetaminophen, acetaminophen, albuterol, HYDROmorphone (DILAUDID) injection, ipratropium, LORazepam, nitroGLYCERIN, ondansetron (ZOFRAN) IV, ondansetron, oxyCODONE  Labs:  CMP     Component Value Date/Time   NA 140 03/20/2012 0500   K 3.8 03/20/2012 0500   CL 106 03/20/2012 0500   CO2 25 03/20/2012 0500   GLUCOSE 121* 03/20/2012 0500   BUN 13 03/20/2012 0500   CREATININE 0.97 03/20/2012 0500   CALCIUM 8.8 03/20/2012 0500   PROT 5.9* 03/12/2012 0436   ALBUMIN 3.0* 03/12/2012 0436   AST 20 03/12/2012 0436   ALT 10 03/12/2012 0436   ALKPHOS 65 03/12/2012 0436   BILITOT 0.2* 03/12/2012 0436   GFRNONAA 82* 03/20/2012 0500   GFRAA >90 03/20/2012 0500     Intake/Output Summary (Last 24 hours) at 03/20/12 1247 Last data filed at 03/20/12 0500  Gross per 24 hour  Intake      0 ml  Output   2500 ml  Net  -2500 ml    CBG (last 3)   Basename 03/20/12 1141 03/20/12 0735 03/19/12 2035  GLUCAP 140* 151* 96    Weight Status:  67 kg (7/2) -- stable  Re-estimated needs:  1600-1800 kcals, 80-90 gm protein  Nutrition Dx:  Inadequate Oral Intake r/t poor appetite as evidenced by patient report, ongoing  Goal:  Oral intake with meals & supplements to meet >90% of estimated nutrition needs, progressing  Monitor:  PO intake, weight, labs, I/O's  Alger Memos Pager #: [phone]

## 2012-03-20 NOTE — Evaluation (Signed)
Occupational Therapy Evaluation Patient Details Name: [name] MRN: [id number] DOB: Jun 11, 1941 Today's Date: 03/20/2012 Time: 7846-9629 OT Time Calculation (min): 24 min  OT Assessment / Plan / Recommendation Clinical Impression  45 male admited with mass in colon with hx of CVA expressive aphasia. Pt from home with previous SNF placement. Pt underwent post left heart catheterization by Dr. Sharyn Lull 03/19/12. Pt with pending surg for Rt hemicolectomy. Ot to follow acutely. Recommend SNF placement     OT Assessment  Patient needs continued OT Services    Follow Up Recommendations  Skilled nursing facility    Barriers to Discharge      Equipment Recommendations  Defer to next venue    Recommendations for Other Services    Frequency  Min 2X/week    Precautions / Restrictions Precautions Precautions: Fall   Pertinent Vitals/Pain None reported. Reports feeling poorly however no pain reported    ADL  Eating/Feeding: Performed;Minimal assistance Where Assessed - Eating/Feeding: Bed level (crying due to dropping jello on chest) Grooming: Performed;Wash/dry hands;Wash/dry face;Minimal assistance Where Assessed - Grooming: Supported standing Upper Body Bathing: Performed;Chest;Minimal assistance (mod v/c) Where Assessed - Upper Body Bathing: Supported standing Upper Body Dressing: Performed;Moderate assistance Where Assessed - Upper Body Dressing: Supported standing Toilet Transfer: Simulated;Minimal Dentist Method: Sit to stand Toilet Transfer Equipment: Raised toilet seat with arms (or 3-in-1 over toilet) Equipment Used: Other (comment) (HHA) Transfers/Ambulation Related to ADLs: Pt ambulated ~8 ft from EOB to sink to chair with HHA ADL Comments: Pt agreeable to OT session. Rn reports pt with void of bowel standing with staff this AM. Pt provided questioning cues for need to void and pt denies need. Pt completed grooming at sink level with Mod v/c and lack  of attention to details with hygiene. pt washing face and needed v/c to use mirror as a tool to (A) with task. Pt with X2 LOB with Mod (A) to correct standing at sink. Pt liable during session. Pt positioned in chair and asked questioning cues on how to get (A) if need to get up. Pt with expressive aphasia so OT requested pt demo. Pt picked up phone and called 911 and started talking to no one. Pt educated on the need to use call bell. Pt perservating on call bell pushing x4 times.     OT Diagnosis: Generalized weakness  OT Problem List:   OT Treatment Interventions: Self-care/ADL training;Therapeutic activities;DME and/or AE instruction;Patient/family education   OT Goals Acute Rehab OT Goals OT Goal Formulation: Patient unable to participate in goal setting Time For Goal Achievement: 04/03/12 Potential to Achieve Goals: Good ADL Goals Pt Will Perform Grooming: with supervision;Standing at sink ADL Goal: Grooming - Progress: Goal set today Pt Will Perform Upper Body Bathing: with set-up;Sitting, edge of bed;Sitting, chair ADL Goal: Upper Body Bathing - Progress: Goal set today Pt Will Perform Lower Body Bathing: with supervision;Sit to stand from chair;Sit to stand from bed ADL Goal: Lower Body Bathing - Progress: Goal set today Pt Will Perform Upper Body Dressing: with set-up;Sitting, chair;Sitting, bed;Unsupported ADL Goal: Upper Body Dressing - Progress: Goal set today Pt Will Perform Lower Body Dressing: with supervision;Sit to stand from bed;Sit to stand from chair ADL Goal: Lower Body Dressing - Progress: Goal set today Pt Will Transfer to Toilet: with supervision;with DME;Ambulation;3-in-1 ADL Goal: Toilet Transfer - Progress: Goal set today Pt Will Perform Toileting - Clothing Manipulation: with supervision;Standing ADL Goal: Toileting - Clothing Manipulation - Progress: Goal set today  Visit Information  Last OT Received On: 03/20/12 Assistance Needed: +1    Subjective Data   Subjective: "feel like shit"- pt with expressive aphasia however clearly stated when asked pain questioning Patient Stated Goal: unable to understand due to expressive aphasia   Prior Functioning  Home Living Lives With: Spouse Available Help at Discharge: Family Additional Comments: No family present to give info. Pt has expressive aphasia.  Communication Communication: Expressive difficulties Dominant Hand:  (using Lt hand to eat)    Cognition  Overall Cognitive Status: Impaired Area of Impairment: Problem solving Arousal/Alertness: Awake/alert Orientation Level:  (states [place] with RN questioning) Behavior During Session: [facility] for tasks performed Problem Solving: use of phone and call bell required v/c from OT Cognition - Other Comments: pt tearful a couple of times.      Extremity/Trunk Assessment Right Upper Extremity Assessment RUE ROM/Strength/Tone: Deficits RUE ROM/Strength/Tone Deficits: grasp 3 out 5 MMT, shoulder flexion WFL, strength grossly 4 out 5 , apraxia RUE Coordination: Deficits RUE Coordination Deficits: feeding self with LT UE  Left Upper Extremity Assessment LUE ROM/Strength/Tone: WFL for tasks assessed LUE Sensation: WFL - Light Touch LUE Coordination: WFL - gross/fine motor Trunk Assessment Trunk Assessment: Normal   Mobility Bed Mobility Supine to Sit: 5: Supervision Details for Bed Mobility Assistance: using Rail and sequencing task without cues. Pt HOB 30 degrees Transfers Sit to Stand: 4: Min assist;With upper extremity assist;From bed Stand to Sit: With upper extremity assist;To chair/3-in-1;4: Min assist Details for Transfer Assistance: min vcs for safety   Exercise    Balance  LOB x 2 during session  End of Session OT - End of Session Equipment Utilized During Treatment: Gait belt Activity Tolerance: Patient tolerated treatment well Patient left: in chair;with call bell/phone within reach;with chair alarm set Nurse Communication:  Mobility status  GO     Lucile Shutters 03/20/2012, 9:18 AM Pager: [phone]

## 2012-03-20 NOTE — Progress Notes (Signed)
Notified Dr. Thedore Mins of patient bp 85/51. Previous bolus given. Patient asymptomatic but tired. New orders given.

## 2012-03-21 ENCOUNTER — Encounter (HOSPITAL_COMMUNITY): Payer: Self-pay | Admitting: Anesthesiology

## 2012-03-21 LAB — GLUCOSE, CAPILLARY: Glucose-Capillary: 149 mg/dL — ABNORMAL HIGH (ref 70–99)

## 2012-03-21 LAB — BASIC METABOLIC PANEL
CO2: 22 mEq/L (ref 19–32)
Chloride: 108 mEq/L (ref 96–112)
Chloride: 106 mEq/L (ref 96–112)
GFR calc Af Amer: >90 mL/min (ref >90)
GFR calc non Af Amer: 88 mL/min — ABNORMAL LOW (ref >90)
Glucose, Bld: 120 mg/dL — ABNORMAL HIGH (ref 70–99)
Potassium: 3.8 mEq/L (ref 3.5–5.1)
Potassium: 3.5 mEq/L (ref 3.5–5.1)
Sodium: 142 mEq/L (ref 135–145)
Sodium: 139 mEq/L (ref 135–145)

## 2012-03-21 MED ORDER — DEXTROSE 5 % IV SOLN
2.0000 g | INTRAVENOUS | Status: DC
  Filled 2012-03-21: qty 2

## 2012-03-21 MED ORDER — DEXTROSE 5 % IV SOLN
2.0000 g | INTRAVENOUS | Status: AC
  Administered 2012-03-22: 2 g via INTRAVENOUS
  Filled 2012-03-21: qty 2

## 2012-03-21 MED ORDER — SODIUM CHLORIDE 0.9 % IV SOLN
INTRAVENOUS | Status: DC
  Administered 2012-03-21 (×2): via INTRAVENOUS

## 2012-03-21 NOTE — Progress Notes (Signed)
[facility]                                                                                Patient Demographics  [name], is a 71 y.o. male  WUJ:[id number]  [id number]:[id number]  DOB - 09-12-1941  Admit date - 03/11/2012  Admitting Physician Laveda Norman, MD  Outpatient Primary MD for the patient is Delorse Lek, MD  LOS - 10   No chief complaint on file.       Subjective:   [name] today has, No headache, No chest pain, No abdominal pain - No Nausea, No new weakness tingling or numbness, No Cough - SOB.    Objective:   Filed Vitals:   03/20/12 1400 03/20/12 1737 03/20/12 2100 03/21/12 0500  BP: 88/53 85/51 146/66 134/71  Pulse: 69 75 64 75  Temp: 98.5 F (36.9 C)  98.7 F (37.1 C) 98.8 F (37.1 C)  TempSrc: Oral  Oral Oral  Resp: 18  18 18   Height:      Weight:      SpO2: 100% 100% 97% 97%    Wt Readings from Last 3 Encounters:  03/19/12 67 kg (147 lb 11.3 oz)  03/19/12 67 kg (147 lb 11.3 oz)  03/19/12 67 kg (147 lb 11.3 oz)     Intake/Output Summary (Last 24 hours) at 03/21/12 0935 Last data filed at 03/21/12 0900  Gross per 24 hour  Intake    450 ml  Output   2600 ml  Net  -2150 ml    Exam Awake Alert,  No new F.N deficits, Normal affect Orient.AT,PERRAL Supple Neck,No JVD, No cervical lymphadenopathy appriciated.  Symmetrical Chest wall movement, Good air movement bilaterally, CTAB RRR,No Gallops,Rubs or new Murmurs, No Parasternal Heave +ve B.Sounds, Abd Soft, Non tender, No organomegaly appriciated, No rebound -guarding or rigidity. No Cyanosis, Clubbing or edema, No new Rash or bruise     Data Review  CBC  Lab 03/20/12 2013 03/20/12 0500 03/18/12 0448 03/16/12 0610  WBC -- 5.5 6.9 7.8  HGB 10.0* 9.5* 9.4* 9.9*  HCT 31.2* 29.6* 28.7* 30.7*  PLT -- 230 243 234  MCV -- 80.2 79.5 80.4  MCH -- 25.7* 26.0 25.9*  MCHC -- 32.1 32.8 32.2  RDW -- 15.5 15.4 15.5  LYMPHSABS -- -- -- --  MONOABS -- -- -- --  EOSABS --  -- -- --  BASOSABS -- -- -- --  BANDABS -- -- -- --    Chemistries   Lab 03/21/12 0555 03/20/12 0500 03/18/12 0448 03/16/12 0610 03/15/12 0501  NA 142 140 137 135 137  K 3.8 3.8 3.7 3.8 3.7  CL 108 106 107 106 108  CO2 23 25 22 21 23   GLUCOSE 120* 121* 120* 140* 146*  BUN 11 13 14 13 13   CREATININE 0.80 0.97 0.85 0.86 0.89  CALCIUM 8.9 8.8 8.5 8.7 8.3*  MG 1.8 -- -- -- --  AST -- -- -- -- --  ALT -- -- -- -- --  ALKPHOS -- -- -- -- --  BILITOT -- -- -- -- --   ------------------------------------------------------------------------------------------------------------------ estimated creatinine clearance is 74.7 ml/min (by C-G formula based on Cr  of 0.8). ------------------------------------------------------------------------------------------------------------------ No results found for this basename: HGBA1C:2 in the last 72 hours ------------------------------------------------------------------------------------------------------------------ No results found for this basename: CHOL:2,HDL:2,LDLCALC:2,TRIG:2,CHOLHDL:2,LDLDIRECT:2 in the last 72 hours ------------------------------------------------------------------------------------------------------------------ No results found for this basename: TSH,T4TOTAL,FREET3,T3FREE,THYROIDAB in the last 72 hours ------------------------------------------------------------------------------------------------------------------ No results found for this basename: VITAMINB12:2,FOLATE:2,FERRITIN:2,TIBC:2,IRON:2,RETICCTPCT:2 in the last 72 hours  Coagulation profile  Lab 03/19/12 0525  INR 1.27  PROTIME --    No results found for this basename: DDIMER:2 in the last 72 hours  Cardiac Enzymes No results found for this basename: CK:3,CKMB:3,TROPONINI:3,MYOGLOBIN:3 in the last 168 hours ------------------------------------------------------------------------------------------------------------------ No components found with this basename:  POCBNP:3  Micro Results Recent Results (from the past 240 hour(s))  CULTURE, BLOOD (ROUTINE X 2)     Status: Normal   Collection Time   03/11/12  5:55 PM      Component Value Range Status Comment   Specimen Description BLOOD RIGHT ARM   Final    Special Requests BOTTLES DRAWN AEROBIC AND ANAEROBIC 10CC   Final    Culture  Setup Time 03/12/2012 01:32   Final    Culture NO GROWTH 5 DAYS   Final    Report Status 03/18/2012 FINAL   Final   CULTURE, BLOOD (ROUTINE X 2)     Status: Normal   Collection Time   03/11/12  6:05 PM      Component Value Range Status Comment   Specimen Description BLOOD RIGHT HAND   Final    Special Requests BOTTLES DRAWN AEROBIC AND ANAEROBIC 10CC   Final    Culture  Setup Time 03/12/2012 01:33   Final    Culture NO GROWTH 5 DAYS   Final    Report Status 03/18/2012 FINAL   Final     Radiology Reports Ct Abdomen Pelvis W Contrast  03/11/2012  *RADIOLOGY REPORT*  Clinical Data: 71 year old male with left lower quadrant abdominal pain.  History of kidney stones.  CT ABDOMEN AND PELVIS WITH CONTRAST  Technique:  Multidetector CT imaging of the abdomen and pelvis was performed following the standard protocol during bolus administration of intravenous contrast.  Contrast: 80mL OMNIPAQUE IOHEXOL 300 MG/ML  SOLN  Comparison: Abdominal radiographs 03/07/2012.  Findings: Dependent opacity and crowding of markings at the lung bases compatible with atelectasis.  Mild cardiomegaly.  No pericardial or pleural effusion.  Sequelae median sternotomy. Degenerative changes in the spine. No acute osseous abnormality identified.  No pelvic free fluid.  The bladder is distended and yet there is evidence of bladder wall thickening.  No perivesical stranding. The prostate is enlarged and lobulated and there is mild contour deformity at the base of the bladder.  Negative distal colon aside from retained stool.  Oral contrast has reached the splenic flexure.  No wall thickening or inflammation of  the left colon.  Contrast mixed with stool throughout the more proximal colon.  There is a the crescent-shaped fungating soft tissue mass arising in the posterior wall of the right colon on series 5 image 30 measuring 22 x 46 x 23 mm (AP by transverse by CC, coronal image 45, sagittal image 20).  A conspicuous but small lymph node in the adjacent mesentery measures 5 mm in short axis (series 2 image 52). No mesenteric lymphadenopathy.  This lesion is nonobstructing.  The lesion is only 1 to 2 cm distal to the ileocecal valve which is best demonstrated on coronal image 33. Distal small bowel loops are nondilated.  There may be mild distal small bowel wall thickening not involving the terminal  ileum in the right lower quadrant (series 2 image 66) but this may be artifact of peristalsis.  No dilated small bowel.  Negative stomach, duodenum, spleen, pancreas, and adrenal glands.  No liver mass identified.  Dependent cholelithiasis.  No pericholecystic inflammation. Multiple low-density areas in both kidneys probably are benign cyst.  There is a 7-8 mm calculus located in the right renal pelvis but not causing obstruction at this time.  No hydronephrosis or hydroureter.  Mild perinephric stranding may be age related.  No abdominal free fluid or lymphadenopathy.  Atherosclerosis of the abdominal aorta and iliac arteries.  IMPRESSION: 1.  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 22 x 46 x 23 mm soft tissue mass.  Small but conspicuous mesenteric lymph node nearby measuring 5 mm short axis.  No liver or distant metastatic disease identified in the abdomen pelvis. 2.  Kidney stone at the right renal pelvis but without associated obstruction at this time. 3.  The bladder is distended with evidence of wall thickening.  The prostate is enlarged.  Chronic bladder outlet obstruction suspected and urology follow up to include prostate evaluation  recommended. 4.  Cholelithiasis.  Aortic atherosclerosis.  Salient findings discussed by telephone with provider Maren Reamer on 03/11/2012 at  Original Report Authenticated By: Harley Hallmark, M.D.   Nm Myocar Multi W/spect W/wall Motion / Ef  03/17/2012  *RADIOLOGY REPORT*  Clinical Data:  Chest pain  Technique:  Standard myocardial SPECT imaging performed after resting intravenous injection of 10 mCi Tc-26m tetrofosmin . Subsequently, intravenous infusion of Lexiscan performed under the supervision of the Cardiology staff.  At peak effect of the drug, 30 mCi Tc-35mtetrofosmin injected intravenously and standard myocardial SPECT imaging performed.  Quantitative gated imaging also performed to evaluate left ventricular wall motion and estimate left ventricular ejection fraction.  Comparison:  None  MYOCARDIAL IMAGING WITH SPECT (REST AND PHARMACOLOGIC-STRESS)  Findings:  There is a small area of mild reversibility involving the apical segment of the anterior wall.  The summed difference score is equal to one.  This likely reflects nonuniform soft tissue attenuation artifact.  GATED LEFT VENTRICULAR WALL MOTION STUDY  Findings:  Review of the gated images demonstrates moderate hypokinesis involving the mid and apical segments of the anterior septum is noted.  There is mild hypokinesis involving the basilar segments of the lateral wall.  Normal wall thickening.  LEFT VENTRICULAR EJECTION FRACTION  Findings:  QGS ejection fraction measures 61% , with an end- diastolic volume of 84 ml and an end-systolic volume of 33 ml.  IMPRESSION:  1. Small area of mild reversibility involving the apical segment of the anterior wall.  This has a summed difference score equal to one and likely reflects nonuniform soft tissue attenuation artifact. True myocardial ischemia is considered less favored.  2.  Anterior septal hypokinesis. 3.  Left ventricular ejection fraction equals 61%.  Original Report Authenticated By: Rosealee Albee, M.D.    Scheduled Meds:    . aspirin  81 mg Oral Daily  . erythromycin  1,000 mg Oral Q6H  . ezetimibe-simvastatin  1 tablet Oral QHS  . feeding supplement  1 Container Oral BID BM  . insulin aspart  0-5 Units Subcutaneous QHS  . insulin aspart  0-9 Units Subcutaneous TID WC  . LORazepam  2 mg Oral QHS  . magnesium citrate  1 Bottle Oral Once  . multivitamin with minerals  1 tablet Oral Daily  .  neomycin  1,000 mg Oral Q6H  . nitroGLYCERIN  0.2 mg Transdermal Daily  . potassium chloride  40 mEq Oral Once  . sodium chloride  250 mL Intravenous Once  . sodium chloride  250 mL Intravenous Once  . sodium chloride  3 mL Intravenous Q12H  . Tamsulosin HCl  0.4 mg Oral QPC breakfast  . vitamin C  500 mg Oral BID  . zinc sulfate  220 mg Oral Daily  . DISCONTD: sodium chloride   Intravenous Once  . DISCONTD: aspirin EC  81 mg Oral Daily  . DISCONTD: ciprofloxacin  400 mg Intravenous Q12H  . DISCONTD: metoprolol tartrate  12.5 mg Oral BID  . DISCONTD: metronidazole  500 mg Intravenous Q8H   Continuous Infusions:    . sodium chloride Stopped (03/20/12 0845)  . sodium chloride     PRN Meds:.acetaminophen, acetaminophen, albuterol, HYDROmorphone (DILAUDID) injection, ipratropium, nitroGLYCERIN, ondansetron (ZOFRAN) IV, ondansetron, oxyCODONE  Assessment & Plan   Assuming care of the patient on 03/20/2012 on day 9 of his hospital stay.    1. Abdominal pain/Colonic mass: Patient presents with approximately one week of altered bowel habit/constipation, left lower quadrant abdominal pain, and a low grade pyrexia>was empirically treated with amoxicillin. Ciprofloxacin/Flagyl started for poss. Diverticulitis on admit. Abdominal/pelvic CT scan showed findings most consistent with adenocarcinoma of the cecum/ascending colon, 22 x 46 x 23 mm soft tissue mass. Small but conspicuous mesenteric lymph node nearby measuring 5 mm short axis. No liver or distant metastatic disease identified  in the abdomen pelvis. Dr Lina Sar saw pt, and colonoscopy was done 6/26 - mass in ascending colon. and CEA Of 6/25 3.2.   -Surgery following.  -Patient is status post left heart catheterization by Dr. Sharyn Lull, discussed his case with him on 03/20/2012, patient has multi-vessel stenosis, currently no plan to place stent, cardiologist has discussed all the options with patient and family, plan is to proceed with surgery patient will be a high-risk candidate for adverse cardiopulmonary outcome during surgery. This is acceptable to patient and family. We will keep the patient at [facility] for the rest of his hospital stay.  - Patient has been on Cipro and Flagyl since his hospital admission, discussed with Dr. Lindie Spruce okay to discontinue, these were stopped on 03/20/2012.     2. Dehydration: This has now resolved with iv fluids. Continue gentle IV hydration as he is only on clears at this time.     3. Anemia, Fe def: Patient has a normocytic anemia, which appeared mild at presentation at 9.4 . hemoglobin dropped to 7.1 and he had been transfused earlier in his hospital stay. hgb improved s/p transfusion, stable.      4. CAD (coronary artery disease)/+stress test: Patient has a known history of CAD, s/p MI,s/p CABG. stress test positive, left heart catheterization done on 03-2012 suggestive of multivessel stenosis, plan is to proceed for surgery with the knowledge that patient will be high-risk candidate for adverse cardiopulmonary outcome. Patient not a good candidate for bare-metal stent as the stenosis is long, drug-eluting stent with Plavix once abdominal surgeries.     5. HTN (hypertension): BP meds on hold - pressure stable unable to tolerate even low dose beta blocker.     6. COPD (chronic obstructive pulmonary disease): Stable/aymptomatic.      7. H/O: CVA (cerebrovascular accident): Stable. On low-dose aspirin.     8. Diabetes mellitus: -continue ssi  CBG  (last 3)   Basename 03/21/12 0746 03/20/12 2030 03/20/12  1647  GLUCAP 149* 133* 134*     9. Urinary retention which happened earlier during his hospital stay  Patient remains on Foley, will place him on low-dose Flomax try to remove Foley post op.     DVT Prophylaxis   SCDs   Procedures CT scan abdomen pelvis, Cardiolite stress test, left heart catheterization  Consults Gen. surgery, cardiology   Leroy Sea M.D on 03/21/2012 at 9:35 AM  Between 7am to 7pm - Pager - [phone]  After 7pm go to [email or web address] - password [email or web address]  And look for the night coverage person covering for me after hours  [facility] Office  [phone]

## 2012-03-21 NOTE — Progress Notes (Signed)
Subjective:  Patient denies any chest pain or shortness of breath. Patient had episode of hypotension yesterday responded to IV fluid bolus  Objective:  Vital Signs in the last 24 hours: Temp:  [98.5 F (36.9 C)-98.8 F (37.1 C)] 98.8 F (37.1 C) (07/04 0500) Pulse Rate:  [64-75] 75  (07/04 0500) Resp:  [18] 18  (07/04 0500) BP: (85-146)/(51-74) 134/71 mmHg (07/04 0500) SpO2:  [97 %-100 %] 97 % (07/04 0500)  Intake/Output from previous day: 07/03 0701 - 07/04 0700 In: 400 [P.O.:400] Out: 2600 [Urine:2600] Intake/Output from this shift: Total I/O In: 50 [P.O.:50] Out: -   Physical Exam: Neck: no adenopathy, no carotid bruit, no JVD and supple, symmetrical, trachea midline Lungs: clear to auscultation bilaterally Heart: regular rate and rhythm, S1, S2 normal, no murmur, click, rub or gallop Abdomen: soft, non-tender; bowel sounds normal; no masses,  no organomegaly Extremities: extremities normal, atraumatic, no cyanosis or edema and Right groin stable  Lab Results:  Basename 03/20/12 2013 03/20/12 0500  WBC -- 5.5  HGB 10.0* 9.5*  PLT -- 230    Basename 03/21/12 0555 03/20/12 0500  NA 142 140  K 3.8 3.8  CL 108 106  CO2 23 25  GLUCOSE 120* 121*  BUN 11 13  CREATININE 0.80 0.97   No results found for this basename: TROPONINI:2,CK,MB:2 in the last 72 hours Hepatic Function Panel No results found for this basename: PROT,ALBUMIN,AST,ALT,ALKPHOS,BILITOT,BILIDIR,IBILI in the last 72 hours No results found for this basename: CHOL in the last 72 hours No results found for this basename: PROTIME in the last 72 hours  Imaging: Imaging results have been reviewed and No results found.  Cardiac Studies:  Assessment/Plan:  Right colonic mass probable adenocarcinoma of colon  Coronary artery disease history of MI in the past status post CABG  Mildly positive Lexiscan Myoview although patient had prolonged ST depression during the stress portion of the test in  inferolateral leads.  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 LOS: 9 days    LOS: 10 days    Ladarren Steiner N 03/21/2012, 9:16 AM

## 2012-03-21 NOTE — Progress Notes (Signed)
Abdominal pain  Subjective: Patient for surgery tomorrow. Having loose stools today. Voices no questions about the surgery  Objective: Vital signs in last 24 hours: Temp:  [98.5 F (36.9 C)-98.8 F (37.1 C)] 98.8 F (37.1 C) (07/04 0500) Pulse Rate:  [64-75] 75  (07/04 0500) Resp:  [18] 18  (07/04 0500) BP: (85-146)/(51-71) 134/71 mmHg (07/04 0500) SpO2:  [97 %-100 %] 97 % (07/04 0500) Last BM Date: 03/20/12  Intake/Output from previous day: 07/03 0701 - 07/04 0700 In: 400 [P.O.:400] Out: 2600 [Urine:2600] Intake/Output this shift: Total I/O In: 50 [P.O.:50] Out: -     Lab Results:  Results for orders placed during the hospital encounter of 03/11/12 (from the past 24 hour(s))  GLUCOSE, CAPILLARY     Status: Abnormal   Collection Time   03/20/12  4:47 PM      Component Value Range   Glucose-Capillary 134 (*) 70 - 99 mg/dL   Comment 1 Notify RN    HEMOGLOBIN AND HEMATOCRIT, BLOOD     Status: Abnormal   Collection Time   03/20/12  8:13 PM      Component Value Range   Hemoglobin 10.0 (*) 13.0 - 17.0 g/dL   HCT 40.9 (*) 81.1 - 91.4 %  TYPE AND SCREEN     Status: Normal (Preliminary result)   Collection Time   03/20/12  8:13 PM      Component Value Range   ABO/RH(D) O POS     Antibody Screen POS     Sample Expiration 03/23/2012     Antibody Identification ANTI-Fya (DUFFY a)     DAT, IgG POS     Antibody ID,T [place] ANTI-Fya (DUFFY a)     Unit Number [id number]     Blood Component Type RED CELLS,LR     Unit division 00     Status of Unit ALLOCATED     Donor AG Type NEGATIVE FOR DUFFY A ANTIGEN     Transfusion Status OK TO TRANSFUSE     Crossmatch Result COMPATIBLE     Unit Number [id number]     Blood Component Type RED CELLS,LR     Unit division 00     Status of Unit ALLOCATED     Donor AG Type NEGATIVE FOR DUFFY A ANTIGEN     Transfusion Status OK TO TRANSFUSE     Crossmatch Result COMPATIBLE     Unit Number [id number]     Blood Component Type RED CELLS,LR     Unit division 00     Status of Unit ALLOCATED     Transfusion Status OK TO TRANSFUSE     Crossmatch Result COMPATIBLE     Unit Number [id number]     Blood Component Type RED CELLS,LR     Unit division 00     Status of Unit ALLOCATED     Transfusion Status OK TO TRANSFUSE     Crossmatch Result COMPATIBLE    GLUCOSE, CAPILLARY     Status: Abnormal   Collection Time   03/20/12  8:30 PM      Component Value Range   Glucose-Capillary 133 (*) 70 - 99 mg/dL   Comment 1 Notify RN    BASIC METABOLIC PANEL     Status: Abnormal   Collection Time   03/21/12  5:55 AM      Component Value Range   Sodium 142  135 - 145 mEq/L   Potassium 3.8  3.5 - 5.1 mEq/L   Chloride 108  96 -  112 mEq/L   CO2 23  19 - 32 mEq/L   Glucose, Bld 120 (*) 70 - 99 mg/dL   BUN 11  6 - 23 mg/dL   Creatinine, Ser 1.61  0.50 - 1.35 mg/dL   Calcium 8.9  8.4 - 09.6 mg/dL   GFR calc non Af Amer 88 (*) >90 mL/min   GFR calc Af Amer >90  >90 mL/min  MAGNESIUM     Status: Normal   Collection Time   03/21/12  5:55 AM      Component Value Range   Magnesium 1.8  1.5 - 2.5 mg/dL  GLUCOSE, CAPILLARY     Status: Abnormal   Collection Time   03/21/12  7:46 AM      Component Value Range   Glucose-Capillary 149 (*) 70 - 99 mg/dL   Comment 1 Notify RN    GLUCOSE, CAPILLARY     Status: Abnormal   Collection Time   03/21/12 11:58 AM      Component Value Range   Glucose-Capillary 176 (*) 70 - 99 mg/dL   Comment 1 Notify RN       Studies/Results Radiology     MEDS, Scheduled    . aspirin  81 mg Oral Daily  . erythromycin  1,000 mg Oral Q6H  . ezetimibe-simvastatin  1 tablet Oral QHS  . feeding supplement  1 Container Oral BID BM  . insulin aspart  0-5 Units Subcutaneous QHS  . insulin aspart  0-9 Units Subcutaneous TID WC  . LORazepam  2 mg Oral QHS  . magnesium citrate  1 Bottle Oral Once  . multivitamin with minerals  1 tablet Oral Daily  . neomycin  1,000 mg Oral Q6H  . nitroGLYCERIN  0.2 mg Transdermal Daily  .  potassium chloride  40 mEq Oral Once  . sodium chloride  250 mL Intravenous Once  . sodium chloride  250 mL Intravenous Once  . sodium chloride  3 mL Intravenous Q12H  . Tamsulosin HCl  0.4 mg Oral QPC breakfast  . vitamin C  500 mg Oral BID  . zinc sulfate  220 mg Oral Daily  . DISCONTD: sodium chloride   Intravenous Once  . DISCONTD: aspirin EC  81 mg Oral Daily  . DISCONTD: metoprolol tartrate  12.5 mg Oral BID     Assessment: Abdominal pain For surgery tomorrow   Plan: For surgery tomorrow  LOS: 10 days    Currie Paris, MD, [facility], [place] [phone]   03/21/2012 12:51 PM

## 2012-03-22 ENCOUNTER — Encounter (HOSPITAL_COMMUNITY): Payer: Self-pay | Admitting: Anesthesiology

## 2012-03-22 ENCOUNTER — Encounter (HOSPITAL_COMMUNITY)
Admission: AD | Disposition: A | Discharge status: Skilled Nursing Facility | Payer: Self-pay | Source: Ambulatory Visit | Attending: Internal Medicine

## 2012-03-22 ENCOUNTER — Inpatient Hospital Stay (HOSPITAL_COMMUNITY): Payer: Medicare Other | Admitting: Anesthesiology

## 2012-03-22 HISTORY — PX: PARTIAL COLECTOMY: SHX5273

## 2012-03-22 LAB — CBC
HCT: 28.8 % — ABNORMAL LOW (ref 39.0–52.0)
Hemoglobin: 9.4 g/dL — ABNORMAL LOW (ref 13.0–17.0)
MCH: 25.8 pg — ABNORMAL LOW (ref 26.0–34.0)
MCHC: 32.6 g/dL (ref 30.0–36.0)

## 2012-03-22 LAB — BASIC METABOLIC PANEL
BUN: 9 mg/dL (ref 6–23)
GFR calc non Af Amer: 86 mL/min — ABNORMAL LOW (ref >90)
Glucose, Bld: 141 mg/dL — ABNORMAL HIGH (ref 70–99)
Potassium: 3.8 mEq/L (ref 3.5–5.1)

## 2012-03-22 LAB — GLUCOSE, CAPILLARY: Glucose-Capillary: 142 mg/dL — ABNORMAL HIGH (ref 70–99)

## 2012-03-22 SURGERY — COLECTOMY, PARTIAL
Anesthesia: General | Site: Abdomen | Laterality: Right | Wound class: Clean Contaminated

## 2012-03-22 MED ORDER — HYDROMORPHONE HCL PF 1 MG/ML IJ SOLN
0.2500 mg | INTRAMUSCULAR | Status: DC | PRN
  Administered 2012-03-22: 0.25 mg via INTRAVENOUS

## 2012-03-22 MED ORDER — PROPOFOL 10 MG/ML IV EMUL
INTRAVENOUS | Status: DC | PRN
  Administered 2012-03-22: 80 mg via INTRAVENOUS

## 2012-03-22 MED ORDER — DEXTROSE 5 % IV SOLN
INTRAVENOUS | Status: AC
  Filled 2012-03-22: qty 1

## 2012-03-22 MED ORDER — POVIDONE-IODINE 10 % EX OINT
TOPICAL_OINTMENT | CUTANEOUS | Status: AC
  Filled 2012-03-22: qty 28.35

## 2012-03-22 MED ORDER — ONDANSETRON HCL 4 MG/2ML IJ SOLN
INTRAMUSCULAR | Status: DC | PRN
  Administered 2012-03-22: 4 mg via INTRAVENOUS

## 2012-03-22 MED ORDER — NEOSTIGMINE METHYLSULFATE 1 MG/ML IJ SOLN
INTRAMUSCULAR | Status: DC | PRN
  Administered 2012-03-22: 1 mg via INTRAVENOUS
  Administered 2012-03-22: 3 mg via INTRAVENOUS

## 2012-03-22 MED ORDER — DEXTROSE 5 % IV SOLN
1.0000 g | Freq: Four times a day (QID) | INTRAVENOUS | Status: AC
  Administered 2012-03-22 – 2012-03-23 (×3): 1 g via INTRAVENOUS
  Filled 2012-03-22 (×4): qty 1

## 2012-03-22 MED ORDER — ONDANSETRON HCL 4 MG/2ML IJ SOLN: 4.0000 mg | Freq: Once | INTRAMUSCULAR | Status: DC | PRN

## 2012-03-22 MED ORDER — LACTATED RINGERS IV SOLN
INTRAVENOUS | Status: DC
  Administered 2012-03-22 (×2): via INTRAVENOUS

## 2012-03-22 MED ORDER — SODIUM CHLORIDE 0.9 % IV BOLUS (SEPSIS)
500.0000 mL | Freq: Once | INTRAVENOUS | Status: AC
  Administered 2012-03-22: 500 mL via INTRAVENOUS

## 2012-03-22 MED ORDER — ACETAMINOPHEN 10 MG/ML IV SOLN
1000.0000 mg | Freq: Once | INTRAVENOUS | Status: AC | PRN
  Administered 2012-03-22: 1000 mg via INTRAVENOUS

## 2012-03-22 MED ORDER — SODIUM CHLORIDE 0.9 % IV BOLUS (SEPSIS)
1000.0000 mL | Freq: Once | INTRAVENOUS | Status: AC
  Administered 2012-03-22: 1000 mL via INTRAVENOUS

## 2012-03-22 MED ORDER — ACETAMINOPHEN 10 MG/ML IV SOLN
INTRAVENOUS | Status: AC
  Administered 2012-03-22: 1000 mg via INTRAVENOUS
  Filled 2012-03-22: qty 100

## 2012-03-22 MED ORDER — ALVIMOPAN 12 MG PO CAPS
12.0000 mg | ORAL_CAPSULE | Freq: Two times a day (BID) | ORAL | Status: DC
  Administered 2012-03-24 – 2012-03-27 (×6): 12 mg via ORAL
  Filled 2012-03-22 (×10): qty 1

## 2012-03-22 MED ORDER — FENTANYL CITRATE 0.05 MG/ML IJ SOLN
INTRAMUSCULAR | Status: DC | PRN
  Administered 2012-03-22 (×2): 100 ug via INTRAVENOUS
  Administered 2012-03-22: 50 ug via INTRAVENOUS

## 2012-03-22 MED ORDER — SODIUM CHLORIDE 0.9 % IV SOLN
INTRAVENOUS | Status: DC
  Administered 2012-03-22 (×2): via INTRAVENOUS

## 2012-03-22 MED ORDER — LIDOCAINE HCL (CARDIAC) 20 MG/ML IV SOLN
INTRAVENOUS | Status: DC | PRN
  Administered 2012-03-22: 50 mg via INTRAVENOUS

## 2012-03-22 MED ORDER — ENOXAPARIN SODIUM 40 MG/0.4ML ~~LOC~~ SOLN
40.0000 mg | SUBCUTANEOUS | Status: DC
  Administered 2012-03-23 – 2012-03-28 (×6): 40 mg via SUBCUTANEOUS
  Filled 2012-03-22 (×7): qty 0.4

## 2012-03-22 MED ORDER — INSULIN ASPART 100 UNIT/ML ~~LOC~~ SOLN
0.0000 [IU] | SUBCUTANEOUS | Status: DC
  Administered 2012-03-22: 3 [IU] via SUBCUTANEOUS
  Administered 2012-03-23: 2 [IU] via SUBCUTANEOUS
  Administered 2012-03-23: 3 [IU] via SUBCUTANEOUS
  Administered 2012-03-23: 2 [IU] via SUBCUTANEOUS

## 2012-03-22 MED ORDER — EPHEDRINE SULFATE 50 MG/ML IJ SOLN
INTRAMUSCULAR | Status: DC | PRN
  Administered 2012-03-22: 15 mg via INTRAVENOUS
  Administered 2012-03-22: 5 mg via INTRAVENOUS

## 2012-03-22 MED ORDER — ONDANSETRON HCL 4 MG/2ML IJ SOLN: 4.0000 mg | Freq: Four times a day (QID) | INTRAMUSCULAR | Status: DC | PRN

## 2012-03-22 MED ORDER — KCL IN DEXTROSE-NACL 20-5-0.45 MEQ/L-%-% IV SOLN
INTRAVENOUS | Status: DC
  Administered 2012-03-22: 17:00:00 via INTRAVENOUS
  Filled 2012-03-22 (×4): qty 1000

## 2012-03-22 MED ORDER — ROCURONIUM BROMIDE 100 MG/10ML IV SOLN
INTRAVENOUS | Status: DC | PRN
  Administered 2012-03-22: 40 mg via INTRAVENOUS

## 2012-03-22 MED ORDER — HYDROMORPHONE HCL PF 1 MG/ML IJ SOLN
INTRAMUSCULAR | Status: AC
  Administered 2012-03-22: 0.25 mg via INTRAVENOUS
  Filled 2012-03-22: qty 1

## 2012-03-22 MED ORDER — HYDROMORPHONE HCL PF 1 MG/ML IJ SOLN: 0.5000 mg | INTRAMUSCULAR | Status: DC | PRN

## 2012-03-22 MED ORDER — CEFOXITIN SODIUM 1 G IV SOLR
INTRAVENOUS | Status: AC
  Filled 2012-03-22: qty 1

## 2012-03-22 MED ORDER — POVIDONE-IODINE 10 % EX OINT
TOPICAL_OINTMENT | CUTANEOUS | Status: DC | PRN
  Administered 2012-03-22: 1 via TOPICAL

## 2012-03-22 MED ORDER — ONDANSETRON HCL 4 MG PO TABS: 4.0000 mg | ORAL_TABLET | Freq: Four times a day (QID) | ORAL | Status: DC | PRN

## 2012-03-22 MED ORDER — GLYCOPYRROLATE 0.2 MG/ML IJ SOLN
INTRAMUSCULAR | Status: DC | PRN
  Administered 2012-03-22: 0.2 mg via INTRAVENOUS
  Administered 2012-03-22: 0.4 mg via INTRAVENOUS

## 2012-03-22 MED ORDER — 0.9 % SODIUM CHLORIDE (POUR BTL) OPTIME
TOPICAL | Status: DC | PRN
  Administered 2012-03-22: 3000 mL

## 2012-03-22 SURGICAL SUPPLY — 52 items
BLADE SURG ROTATE 9660 (MISCELLANEOUS) ×1 IMPLANT
CANISTER SUCTION 2500CC (MISCELLANEOUS) ×2 IMPLANT
CHLORAPREP W/TINT 26ML (MISCELLANEOUS) ×2 IMPLANT
CLOTH BEACON ORANGE TIMEOUT ST (SAFETY) ×2 IMPLANT
COVER SURGICAL LIGHT HANDLE (MISCELLANEOUS) ×2 IMPLANT
DRAPE LAPAROSCOPIC ABDOMINAL (DRAPES) ×2 IMPLANT
DRAPE PROXIMA HALF (DRAPES) IMPLANT
DRAPE UTILITY 15X26 W/TAPE STR (DRAPE) ×4 IMPLANT
DRAPE WARM FLUID 44X44 (DRAPE) ×2 IMPLANT
ELECT BLADE 6.5 EXT (BLADE) ×1 IMPLANT
ELECT CAUTERY BLADE 6.4 (BLADE) ×4 IMPLANT
ELECT REM PT RETURN 9FT ADLT (ELECTROSURGICAL) ×2
ELECTRODE REM PT RTRN 9FT ADLT (ELECTROSURGICAL) ×1 IMPLANT
GLOVE BIOGEL PI IND STRL 8 (GLOVE) ×1 IMPLANT
GLOVE BIOGEL PI INDICATOR 8 (GLOVE) ×1
GLOVE ECLIPSE 7.5 STRL STRAW (GLOVE) ×4 IMPLANT
GOWN STRL NON-REIN LRG LVL3 (GOWN DISPOSABLE) ×6 IMPLANT
KIT BASIN OR (CUSTOM PROCEDURE TRAY) ×2 IMPLANT
KIT ROOM TURNOVER OR (KITS) ×2 IMPLANT
LEGGING LITHOTOMY PAIR STRL (DRAPES) IMPLANT
LIGASURE IMPACT 36 18CM CVD LR (INSTRUMENTS) ×1 IMPLANT
NS IRRIG 1000ML POUR BTL (IV SOLUTION) ×2 IMPLANT
PACK GENERAL/GYN (CUSTOM PROCEDURE TRAY) ×2 IMPLANT
PAD ARMBOARD 7.5X6 YLW CONV (MISCELLANEOUS) ×4 IMPLANT
RELOAD PROXIMATE 75MM BLUE (ENDOMECHANICALS) ×2 IMPLANT
RELOAD STAPLE 75 3.8 BLU REG (ENDOMECHANICALS) IMPLANT
SPECIMEN JAR X LARGE (MISCELLANEOUS) ×2 IMPLANT
SPONGE GAUZE 4X4 12PLY (GAUZE/BANDAGES/DRESSINGS) ×2 IMPLANT
SPONGE LAP 18X18 X RAY DECT (DISPOSABLE) IMPLANT
STAPLER GUN LINEAR PROX 60 (STAPLE) ×1 IMPLANT
STAPLER PROXIMATE 75MM BLUE (STAPLE) ×1 IMPLANT
STAPLER VISISTAT 35W (STAPLE) ×2 IMPLANT
SUCTION POOLE TIP (SUCTIONS) ×2 IMPLANT
SURGILUBE 2OZ TUBE FLIPTOP (MISCELLANEOUS) IMPLANT
SUT PDS AB 1 TP1 96 (SUTURE) ×4 IMPLANT
SUT PROLENE 2 0 CT2 30 (SUTURE) IMPLANT
SUT PROLENE 2 0 KS (SUTURE) IMPLANT
SUT SILK 2 0 SH CR/8 (SUTURE) ×2 IMPLANT
SUT SILK 2 0 TIES 10X30 (SUTURE) ×2 IMPLANT
SUT SILK 3 0 SH CR/8 (SUTURE) ×2 IMPLANT
SUT SILK 3 0 TIES 10X30 (SUTURE) ×2 IMPLANT
SUT VIC AB 3-0 SH 27 (SUTURE) ×4
SUT VIC AB 3-0 SH 27X BRD (SUTURE) ×2 IMPLANT
SUT VIC AB 3-0 SH 8-18 (SUTURE) ×2 IMPLANT
TAPE CLOTH SURG 6X10 WHT LF (GAUZE/BANDAGES/DRESSINGS) ×1 IMPLANT
TOWEL OR 17X24 6PK STRL BLUE (TOWEL DISPOSABLE) ×2 IMPLANT
TOWEL OR 17X26 10 PK STRL BLUE (TOWEL DISPOSABLE) ×2 IMPLANT
TRAY FOLEY CATH 14FRSI W/METER (CATHETERS) IMPLANT
TRAY PROCTOSCOPIC FIBER OPTIC (SET/KITS/TRAYS/PACK) IMPLANT
UNDERPAD 30X30 INCONTINENT (UNDERPADS AND DIAPERS) IMPLANT
WATER STERILE IRR 1000ML POUR (IV SOLUTION) ×2 IMPLANT
YANKAUER SUCT BULB TIP NO VENT (SUCTIONS) ×2 IMPLANT

## 2012-03-22 NOTE — Op Note (Signed)
OPERATIVE REPORT  DATE OF OPERATION: 03/11/2012 - 03/22/2012  PATIENT:  [name]  71 y.o. male  PRE-OPERATIVE DIAGNOSIS:  Right colon mass/cecal mass  POST-OPERATIVE DIAGNOSIS:  Right colon mass/cecal mass  PROCEDURE:  Procedure(s): PARTIAL COLECTOMY. Right Hemicolectomy  SURGEON:  Surgeon(s): Cherylynn Ridges, MD Wilmon Arms. Corliss Skains, MD  ASSISTANT: Tsuei  ANESTHESIA:   general  EBL: <50 ml  BLOOD ADMINISTERED: none  DRAINS: Urinary Catheter (Foley)   SPECIMEN:  Source of Specimen:  Right colon and terminal ileum  COUNTS CORRECT:  YES  PROCEDURE DETAILS: The patient was taken to the operating room and placed on the table in the supine position. After an adequate endotracheal anesthetic was administered his abdomen was prepped and draped in the usual sterile manner.  The patient's right-sided been marked for right hemicolectomy. After proper time out was performed identifying the patient and the procedure to be performed midline incision was made using a #10 blade and had to be elongated subsequently in order to allow for the procedure. It is noted that the mass in this patient's: Within the cecum however it was closer to the hepatic flexure.  With a Balfour retractor in place and also which some retractors were mobilized right colon including the terminal ileum. We took it up to and past the hepatic flexure involving the right transverse colon. We came across the terminal ileum approximately 8 cm proximal to the ileocecal valve. We came across with a GIA-75 stapler. The distal transection was approximately 10-12 cm distal to the palpated mass near the hepatic flexure. The mesentery in between was taken using a LigaSure device. However the ileocolic vessel was ligated separately with a 2-0 silk tie.  Once this was done a side to side functional end-to-end anastomosis was made between the ileum and the proximal transverse colon using a GIA-75 stapler. The subsequent enterotomy was  closed using a TX 60 stapler.the mesentery was then closed using interrupted 2-0 silk sutures. The anastomosis appeared to be viable and healthy. The surgeon and subsequently changed gloves. We irrigated with saline solution and closed the abdomen using running looped #1 PDS suture. All needle counts sponge counts and instrument counts were correct. We irrigated the subcutaneous tissue with saline solution and the skin was closed using stainless steel staples.  PATIENT DISPOSITION:  PACU - hemodynamically stable.   Cherylynn Ridges 7/5/20131:08 PM

## 2012-03-22 NOTE — Progress Notes (Signed)
PT Cancellation Note  Treatment cancelled today due to patient receiving procedure or test. Pt on the way to surgery.  [name] 03/22/2012, 9:54 AM

## 2012-03-22 NOTE — Preoperative (Signed)
Beta Blockers   Reason not to administer Beta Blockers:Not Applicable 

## 2012-03-22 NOTE — Progress Notes (Signed)
Spoke to patient's wife she gave me and Ramond Craver, RN permission for daughter Kathie Rhodes to consent for surgery and blood.  Spoke to patient's daughter Kathie Rhodes, she gave consent via phone to both my self and Ramond Craver, RN for Right Hemicolectomy and for blood if needed

## 2012-03-22 NOTE — Progress Notes (Signed)
Subjective:  Patient awake denies any complaints tolerated right hemi-colectomy well. Objective:  Vital Signs in the last 24 hours: Temp:  [97.8 F (36.6 C)-99 F (37.2 C)] 98 F (36.7 C) (07/05 1559) Pulse Rate:  [56-87] 60  (07/05 1559) Resp:  [16-37] 19  (07/05 1559) BP: (86-151)/(47-63) 151/62 mmHg (07/05 1559) SpO2:  [93 %-99 %] 95 % (07/05 1559) Arterial Line BP: (152-168)/(44-50) 154/46 mmHg (07/05 1400) FiO2 (%):  [30 %] 30 % (07/05 1415)  Intake/Output from previous day: 07/04 0701 - 07/05 0700 In: 600 [P.O.:50; I.V.:550] Out: 1900 [Urine:1900] Intake/Output from this shift: Total I/O In: 1500 [I.V.:1500] Out: 753 [Urine:700; Stool:3; Blood:50]  Physical Exam: Neck: no adenopathy, no carotid bruit, no JVD and supple, symmetrical, trachea midline Lungs: clear to auscultation bilaterally Heart: regular rate and rhythm, S1, S2 normal, no murmur, click, rub or gallop Abdomen: Soft bowel sounds absent surgical dressing dry Extremities: extremities normal, atraumatic, no cyanosis or edema  Lab Results:  Basename 03/22/12 0545 03/20/12 2013 03/20/12 0500  WBC 5.9 -- 5.5  HGB 9.4* 10.0* --  PLT 235 -- 230    Basename 03/22/12 0545 03/21/12 2214  NA 139 139  K 3.8 3.5  CL 107 106  CO2 22 22  GLUCOSE 141* 109*  BUN 9 10  CREATININE 0.85 0.73   No results found for this basename: TROPONINI:2,CK,MB:2 in the last 72 hours Hepatic Function Panel No results found for this basename: PROT,ALBUMIN,AST,ALT,ALKPHOS,BILITOT,BILIDIR,IBILI in the last 72 hours No results found for this basename: CHOL in the last 72 hours No results found for this basename: PROTIME in the last 72 hours  Imaging: Imaging results have been reviewed and No results found.  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 Algie Coffer on-call for weekend oh  LOS: 11 days    Akaisha Truman N 03/22/2012, 5:13 PM

## 2012-03-22 NOTE — Progress Notes (Signed)
[facility]                                                                                Patient Demographics  [name], is a 71 y.o. male  WUJ:[id number]  [id number]  DOB - 12/06/1940  Admit date - 03/11/2012  Admitting Physician Laveda Norman, MD  Outpatient Primary MD for the patient is Delorse Lek, MD  LOS - 11   No chief complaint on file.       Subjective:   [name] today has, No headache, No chest pain, No abdominal pain - No Nausea, No new weakness tingling or numbness, No Cough - SOB.  Is in chair.  Objective:   Filed Vitals:   03/22/12 0420 03/22/12 0525 03/22/12 0530 03/22/12 0830  BP: 86/47 87/58 92/55  97/53  Pulse:  75  71  Temp:  99 F (37.2 C)  98.4 F (36.9 C)  TempSrc:  Oral  Oral  Resp:  18  18  Height:      Weight:      SpO2:  98%  99%    Wt Readings from Last 3 Encounters:  03/19/12 67 kg (147 lb 11.3 oz)  03/19/12 67 kg (147 lb 11.3 oz)  03/19/12 67 kg (147 lb 11.3 oz)     Intake/Output Summary (Last 24 hours) at 03/22/12 0847 Last data filed at 03/22/12 0757  Gross per 24 hour  Intake    525 ml  Output   1903 ml  Net  -1378 ml    Exam Awake Alert,  No new F.N deficits, Normal affect [place].AT,PERRAL Supple Neck,No JVD, No cervical lymphadenopathy appriciated.  Symmetrical Chest wall movement, Good air movement bilaterally, CTAB RRR,No Gallops,Rubs or new Murmurs, No Parasternal Heave +ve B.Sounds, Abd Soft, Non tender, No organomegaly appriciated, No rebound -guarding or rigidity. No Cyanosis, Clubbing or edema, No new Rash or bruise     Data Review  CBC  Lab 03/22/12 0545 03/20/12 2013 03/20/12 0500 03/18/12 0448 03/16/12 0610  WBC 5.9 -- 5.5 6.9 7.8  HGB 9.4* 10.0* 9.5* 9.4* 9.9*  HCT 28.8* 31.2* 29.6* 28.7* 30.7*  PLT 235 -- 230 243 234  MCV 79.1 -- 80.2 79.5 80.4  MCH 25.8* -- 25.7* 26.0 25.9*  MCHC 32.6 -- 32.1 32.8 32.2  RDW 15.0 -- 15.5 15.4 15.5  LYMPHSABS -- -- -- -- --    MONOABS -- -- -- -- --  EOSABS -- -- -- -- --  BASOSABS -- -- -- -- --  BANDABS -- -- -- -- --    Chemistries   Lab 03/22/12 0545 03/21/12 2214 03/21/12 0555 03/20/12 0500 03/18/12 0448  NA 139 139 142 140 137  K 3.8 3.5 3.8 3.8 3.7  CL 107 106 108 106 107  CO2 22 22 23 25 22   GLUCOSE 141* 109* 120* 121* 120*  BUN 9 10 11 13 14   CREATININE 0.85 0.73 0.80 0.97 0.85  CALCIUM 8.5 8.5 8.9 8.8 8.5  MG -- -- 1.8 -- --  AST -- -- -- -- --  ALT -- -- -- -- --  ALKPHOS -- -- -- -- --  BILITOT -- -- -- -- --   ------------------------------------------------------------------------------------------------------------------  estimated creatinine clearance is 70.3 ml/min (by C-G formula based on Cr of 0.85). ------------------------------------------------------------------------------------------------------------------ No results found for this basename: HGBA1C:2 in the last 72 hours ------------------------------------------------------------------------------------------------------------------ No results found for this basename: CHOL:2,HDL:2,LDLCALC:2,TRIG:2,CHOLHDL:2,LDLDIRECT:2 in the last 72 hours ------------------------------------------------------------------------------------------------------------------ No results found for this basename: TSH,T4TOTAL,FREET3,T3FREE,THYROIDAB in the last 72 hours ------------------------------------------------------------------------------------------------------------------ No results found for this basename: VITAMINB12:2,FOLATE:2,FERRITIN:2,TIBC:2,IRON:2,RETICCTPCT:2 in the last 72 hours  Coagulation profile  Lab 03/19/12 0525  INR 1.27  PROTIME --    No results found for this basename: DDIMER:2 in the last 72 hours  Cardiac Enzymes No results found for this basename: CK:3,CKMB:3,TROPONINI:3,MYOGLOBIN:3 in the last 168  hours ------------------------------------------------------------------------------------------------------------------ No components found with this basename: POCBNP:3  Micro Results No results found for this or any previous visit (from the past 240 hour(s)).  Radiology Reports Ct Abdomen Pelvis W Contrast  03/11/2012  *RADIOLOGY REPORT*  Clinical Data: 71 year old male with left lower quadrant abdominal pain.  History of kidney stones.  CT ABDOMEN AND PELVIS WITH CONTRAST  Technique:  Multidetector CT imaging of the abdomen and pelvis was performed following the standard protocol during bolus administration of intravenous contrast.  Contrast: 80mL OMNIPAQUE IOHEXOL 300 MG/ML  SOLN  Comparison: Abdominal radiographs 03/07/2012.  Findings: Dependent opacity and crowding of markings at the lung bases compatible with atelectasis.  Mild cardiomegaly.  No pericardial or pleural effusion.  Sequelae median sternotomy. Degenerative changes in the spine. No acute osseous abnormality identified.  No pelvic free fluid.  The bladder is distended and yet there is evidence of bladder wall thickening.  No perivesical stranding. The prostate is enlarged and lobulated and there is mild contour deformity at the base of the bladder.  Negative distal colon aside from retained stool.  Oral contrast has reached the splenic flexure.  No wall thickening or inflammation of the left colon.  Contrast mixed with stool throughout the more proximal colon.  There is a the crescent-shaped fungating soft tissue mass arising in the posterior wall of the right colon on series 5 image 30 measuring 22 x 46 x 23 mm (AP by transverse by CC, coronal image 45, sagittal image 20).  A conspicuous but small lymph node in the adjacent mesentery measures 5 mm in short axis (series 2 image 52). No mesenteric lymphadenopathy.  This lesion is nonobstructing.  The lesion is only 1 to 2 cm distal to the ileocecal valve which is best demonstrated on  coronal image 33. Distal small bowel loops are nondilated.  There may be mild distal small bowel wall thickening not involving the terminal ileum in the right lower quadrant (series 2 image 66) but this may be artifact of peristalsis.  No dilated small bowel.  Negative stomach, duodenum, spleen, pancreas, and adrenal glands.  No liver mass identified.  Dependent cholelithiasis.  No pericholecystic inflammation. Multiple low-density areas in both kidneys probably are benign cyst.  There is a 7-8 mm calculus located in the right renal pelvis but not causing obstruction at this time.  No hydronephrosis or hydroureter.  Mild perinephric stranding may be age related.  No abdominal free fluid or lymphadenopathy.  Atherosclerosis of the abdominal aorta and iliac arteries.  IMPRESSION: 1.  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 22 x 46 x 23 mm soft tissue mass.  Small but conspicuous mesenteric lymph node nearby measuring 5 mm short axis.  No liver or distant metastatic disease identified in the abdomen pelvis. 2.  Kidney stone at  the right renal pelvis but without associated obstruction at this time. 3.  The bladder is distended with evidence of wall thickening.  The prostate is enlarged.  Chronic bladder outlet obstruction suspected and urology follow up to include prostate evaluation recommended. 4.  Cholelithiasis.  Aortic atherosclerosis.  Salient findings discussed by telephone with provider Maren Reamer on 03/11/2012 at  Original Report Authenticated By: Harley Hallmark, M.D.   Nm Myocar Multi W/spect W/wall Motion / Ef  03/17/2012  *RADIOLOGY REPORT*  Clinical Data:  Chest pain  Technique:  Standard myocardial SPECT imaging performed after resting intravenous injection of 10 mCi Tc-56m tetrofosmin . Subsequently, intravenous infusion of Lexiscan performed under the supervision of the Cardiology staff.  At peak effect of the drug, 30  mCi Tc-67mtetrofosmin injected intravenously and standard myocardial SPECT imaging performed.  Quantitative gated imaging also performed to evaluate left ventricular wall motion and estimate left ventricular ejection fraction.  Comparison:  None  MYOCARDIAL IMAGING WITH SPECT (REST AND PHARMACOLOGIC-STRESS)  Findings:  There is a small area of mild reversibility involving the apical segment of the anterior wall.  The summed difference score is equal to one.  This likely reflects nonuniform soft tissue attenuation artifact.  GATED LEFT VENTRICULAR WALL MOTION STUDY  Findings:  Review of the gated images demonstrates moderate hypokinesis involving the mid and apical segments of the anterior septum is noted.  There is mild hypokinesis involving the basilar segments of the lateral wall.  Normal wall thickening.  LEFT VENTRICULAR EJECTION FRACTION  Findings:  QGS ejection fraction measures 61% , with an end- diastolic volume of 84 ml and an end-systolic volume of 33 ml.  IMPRESSION:  1. Small area of mild reversibility involving the apical segment of the anterior wall.  This has a summed difference score equal to one and likely reflects nonuniform soft tissue attenuation artifact. True myocardial ischemia is considered less favored.  2.  Anterior septal hypokinesis. 3.  Left ventricular ejection fraction equals 61%.  Original Report Authenticated By: Rosealee Albee, M.D.    Scheduled Meds:    . aspirin  81 mg Oral Daily  . cefOXitin  2 g Intravenous 60 min Pre-Op  . erythromycin  1,000 mg Oral Q6H  . ezetimibe-simvastatin  1 tablet Oral QHS  . feeding supplement  1 Container Oral BID BM  . insulin aspart  0-5 Units Subcutaneous QHS  . insulin aspart  0-9 Units Subcutaneous TID WC  . LORazepam  2 mg Oral QHS  . magnesium citrate  1 Bottle Oral Once  . multivitamin with minerals  1 tablet Oral Daily  . neomycin  1,000 mg Oral Q6H  . sodium chloride  1,000 mL Intravenous Once  . sodium chloride  250 mL  Intravenous Once  . sodium chloride  500 mL Intravenous Once  . sodium chloride  3 mL Intravenous Q12H  . Tamsulosin HCl  0.4 mg Oral QPC breakfast  . vitamin C  500 mg Oral BID  . zinc sulfate  220 mg Oral Daily  . DISCONTD: sodium chloride   Intravenous Once  . DISCONTD: cefOXitin  2 g Intravenous 60 min Pre-Op  . DISCONTD: nitroGLYCERIN  0.2 mg Transdermal Daily  . DISCONTD: potassium chloride  40 mEq Oral Once   Continuous Infusions:    . sodium chloride 100 mL/hr at 03/22/12 0747  . DISCONTD: sodium chloride 50 mL/hr at 03/21/12 1835   PRN Meds:.acetaminophen, acetaminophen, albuterol, HYDROmorphone (DILAUDID) injection, ipratropium, nitroGLYCERIN, ondansetron (ZOFRAN) IV, ondansetron, oxyCODONE  Assessment & Plan    Assuming care of the patient on 03/20/2012 on day 9 of his hospital stay.    1. Abdominal pain/Colonic mass: Patient presents with approximately one week of altered bowel habit/constipation, left lower quadrant abdominal pain, and a low grade pyrexia>was empirically treated with amoxicillin. Ciprofloxacin/Flagyl started for poss. Diverticulitis on admit. Abdominal/pelvic CT scan showed findings most consistent with adenocarcinoma of the cecum/ascending colon, 22 x 46 x 23 mm soft tissue mass. Small but conspicuous mesenteric lymph node nearby measuring 5 mm short axis. No liver or distant metastatic disease identified in the abdomen pelvis. Dr Lina Sar saw pt, and colonoscopy was done 6/26 - mass in ascending colon. and CEA Of 03/12/12  Is 3.2.   -Surgery following.  -Patient is status post left heart catheterization by Dr. Sharyn Lull, discussed his case with him on 03/20/2012, patient has multi-vessel stenosis, currently no plan to place stent, cardiologist has discussed all the options with patient and family, plan is to proceed with surgery patient will be a high-risk candidate for adverse cardiopulmonary outcome during surgery. This is acceptable to patient and  family. We will keep the patient at [facility] for the rest of his hospital stay.  - Patient is NPO, getting IVF as BP soft from NPO status, asymtomatic, BP improving with bolus, he is in chair watching TV, surgery later today.     2. Dehydration: resolved, IVF as NPO.     3. Anemia, Fe def: Patient has a normocytic anemia, which appeared mild at presentation at 9.4 . hemoglobin dropped to 7.1 and he had been transfused earlier in his hospital stay. hgb improved s/p transfusion, stable. Goal Hb>8 due to CAD burden.     4. CAD (coronary artery disease)/+stress test: Patient has a known history of CAD, s/p MI,s/p CABG. stress test positive, left heart catheterization done on 03-2012 suggestive of multivessel stenosis, plan is to proceed for surgery with the knowledge that patient will be high-risk candidate for adverse cardiopulmonary outcome. Patient not a good candidate for bare-metal stent as the stenosis is long, drug-eluting stent with Plavix once abdominal surgeries.     5. HTN (hypertension): BP meds on hold - pressure  unable to tolerate even low dose beta blocker.     6. COPD (chronic obstructive pulmonary disease): Stable/aymptomatic.      7. H/O: CVA (cerebrovascular accident): Stable. On low-dose aspirin.     8. Diabetes mellitus: -continue ssi   CBG (last 3)   Basename 03/22/12 0726 03/21/12 2021 03/21/12 1643  GLUCAP 142* 137* 176*      9. Urinary retention which happened earlier during his hospital stay  Patient remains on Foley, will place him on low-dose Flomax try to remove Foley post op.     DVT Prophylaxis   SCDs   Procedures CT scan abdomen pelvis, Cardiolite stress test, left heart catheterization  Consults Gen. surgery, cardiology   Leroy Sea M.D on 03/22/2012 at 8:47 AM  Between 7am to 7pm - Pager - [phone]  After 7pm go to [email or web address] - password [email or web address]  And look for the night coverage person covering for me  after hours  [facility] Office  [phone]

## 2012-03-22 NOTE — Transfer of Care (Signed)
Immediate Anesthesia Transfer of Care Note  Patient: [name]  Procedure(s) Performed: Procedure(s) (LRB): PARTIAL COLECTOMY (Right)  Patient Location: PACU  Anesthesia Type: General  Level of Consciousness: lethargic and responds to stimulation  Airway & Oxygen Therapy: Patient Spontanous Breathing and Patient connected to nasal cannula oxygen  Post-op Assessment: Report given to PACU RN  Post vital signs: Reviewed and stable  Complications: No apparent anesthesia complications

## 2012-03-22 NOTE — Anesthesia Postprocedure Evaluation (Signed)
  Anesthesia Post-op Note  Patient: [name]  Procedure(s) Performed: Procedure(s) (LRB): PARTIAL COLECTOMY (Right)  Patient Location: PACU  Anesthesia Type: General  Level of Consciousness: awake and lethargic  Airway and Oxygen Therapy: Patient Spontanous Breathing and Patient connected to nasal cannula oxygen  Post-op Pain: mild  Post-op Assessment: Post-op Vital signs reviewed and Patient's Cardiovascular Status Stable  Post-op Vital Signs: stable  Complications: No apparent anesthesia complications

## 2012-03-22 NOTE — Progress Notes (Signed)
Pt BP is 86/47.  MD notified.  New orders received.  Will continue to monitor.

## 2012-03-22 NOTE — Progress Notes (Signed)
When pt arrived to pacu, was taking shallow breaths, Dr.Joslin at bedside and pt placed on Bipap per his orders, pt tol/well, pt more alert at this time, ok per Dr.Joslin to dc Bipap, will cont to monitor.

## 2012-03-22 NOTE — Progress Notes (Signed)
Recheck pt BP per MD order.  Pt BP is 92/55.  Will continue to monitor.

## 2012-03-22 NOTE — Anesthesia Procedure Notes (Addendum)
Procedure Name: Intubation Date/Time: 03/22/2012 11:55 AM Performed by: Jefm Miles E Pre-anesthesia Checklist: Patient identified, Timeout performed, Emergency Drugs available, Suction available and Patient being monitored Patient Re-evaluated:Patient Re-evaluated prior to inductionOxygen Delivery Method: Circle system utilized Preoxygenation: Pre-oxygenation with 100% oxygen Intubation Type: IV induction Ventilation: Mask ventilation without difficulty and Oral airway inserted - appropriate to patient size Laryngoscope Size: Mac and 3 Grade View: Grade II Tube type: Oral Tube size: 7.5 mm Number of attempts: 1 Airway Equipment and Method: Stylet Placement Confirmation: ETT inserted through vocal cords under direct vision,  breath sounds checked- equal and bilateral and positive ETCO2 Secured at: 21 cm Tube secured with: Tape Dental Injury: Teeth and Oropharynx as per pre-operative assessment    Date/Time: 03/22/2012 11:52 AM Performed by: Jefm Miles E Pre-anesthesia Checklist: Patient identified, Timeout performed, Emergency Drugs available, Suction available and Patient being monitored Patient Re-evaluated:Patient Re-evaluated prior to inductionOxygen Delivery Method: Circle system utilized Preoxygenation: Pre-oxygenation with 100% oxygen Intubation Type: IV induction Ventilation: Mask ventilation without difficulty and Oral airway inserted - appropriate to patient size Laryngoscope Size: Mac and 3 Grade View: Grade II Tube type: Oral Tube size: 7.5 mm Number of attempts: 1 Airway Equipment and Method: Stylet Dental Injury: Teeth and Oropharynx as per pre-operative assessment

## 2012-03-22 NOTE — Anesthesia Preprocedure Evaluation (Addendum)
Anesthesia Evaluation  Patient identified by MRN, date of birth, ID band Patient awake    Reviewed: Allergy & Precautions, H&P , NPO status , Patient's Chart, lab work & pertinent test results  Airway Mallampati: I TM Distance: >3 FB Neck ROM: Full    Dental  (+) Edentulous Upper, Edentulous Lower and Dental Advisory Given   Pulmonary shortness of breath, COPD breath sounds clear to auscultation        Cardiovascular hypertension, Pt. on medications + CAD and + Past MI Rhythm:Regular Rate:Normal     Neuro/Psych CVA, Residual Symptoms    GI/Hepatic   Endo/Other  Diabetes mellitus-, Type 2  Renal/GU      Musculoskeletal   Abdominal   Peds  Hematology   Anesthesia Other Findings   Reproductive/Obstetrics                          Anesthesia Physical Anesthesia Plan  ASA: III  Anesthesia Plan: General   Post-op Pain Management:    Induction: Intravenous  Airway Management Planned: Oral ETT  Additional Equipment:   Intra-op Plan:   Post-operative Plan: Extubation in OR  Informed Consent: I have reviewed the patients History and Physical, chart, labs and discussed the procedure including the risks, benefits and alternatives for the proposed anesthesia with the patient or authorized representative who has indicated his/her understanding and acceptance.     Plan Discussed with:   Anesthesia Plan Comments: (R. Colon mass CAD s/p CABG severe 3 vessel CAD with l. Subclavian stenosis (+) cardiolite S/P CVA With expressive aphasia  R. Sided weakness Type 2  Htn  Plan GA with art line  Kipp Brood)        Anesthesia Quick Evaluation

## 2012-03-23 ENCOUNTER — Inpatient Hospital Stay (HOSPITAL_COMMUNITY): Payer: Medicare Other

## 2012-03-23 LAB — HEMOGLOBIN AND HEMATOCRIT, BLOOD: HCT: 31.3 % — ABNORMAL LOW (ref 39.0–52.0)

## 2012-03-23 LAB — BASIC METABOLIC PANEL
BUN: 7 mg/dL (ref 6–23)
Calcium: 8.6 mg/dL (ref 8.4–10.5)
GFR calc non Af Amer: >90 mL/min (ref >90)
Glucose, Bld: 164 mg/dL — ABNORMAL HIGH (ref 70–99)

## 2012-03-23 LAB — GLUCOSE, CAPILLARY
Glucose-Capillary: 160 mg/dL — ABNORMAL HIGH (ref 70–99)
Glucose-Capillary: 164 mg/dL — ABNORMAL HIGH (ref 70–99)
Glucose-Capillary: 149 mg/dL — ABNORMAL HIGH (ref 70–99)
Glucose-Capillary: 144 mg/dL — ABNORMAL HIGH (ref 70–99)

## 2012-03-23 LAB — CBC
HCT: 31 % — ABNORMAL LOW (ref 39.0–52.0)
Hemoglobin: 9.9 g/dL — ABNORMAL LOW (ref 13.0–17.0)
MCH: 24.6 pg — ABNORMAL LOW (ref 26.0–34.0)
MCHC: 31.9 g/dL (ref 30.0–36.0)
MCV: 77.1 fL — ABNORMAL LOW (ref 78.0–100.0)

## 2012-03-23 MED ORDER — OXYCODONE HCL 5 MG PO TABS
5.0000 mg | ORAL_TABLET | Freq: Four times a day (QID) | ORAL | Status: DC | PRN
  Administered 2012-03-25 – 2012-03-28 (×4): 5 mg via ORAL
  Filled 2012-03-23 (×5): qty 1

## 2012-03-23 MED ORDER — METOPROLOL TARTRATE 1 MG/ML IV SOLN: 5.0000 mg | INTRAVENOUS | Status: DC | PRN

## 2012-03-23 MED ORDER — SODIUM CHLORIDE 0.9 % IV SOLN
INTRAVENOUS | Status: DC
  Administered 2012-03-23: 12:00:00 via INTRAVENOUS

## 2012-03-23 MED ORDER — MORPHINE SULFATE 2 MG/ML IJ SOLN
1.0000 mg | INTRAMUSCULAR | Status: DC | PRN
  Administered 2012-03-23 – 2012-03-24 (×4): 1 mg via INTRAVENOUS
  Filled 2012-03-23 (×4): qty 1

## 2012-03-23 MED ORDER — MORPHINE SULFATE 2 MG/ML IJ SOLN
2.0000 mg | INTRAMUSCULAR | Status: DC | PRN
  Administered 2012-03-23: 2 mg via INTRAVENOUS

## 2012-03-23 MED ORDER — MORPHINE SULFATE 2 MG/ML IJ SOLN
INTRAMUSCULAR | Status: AC
  Filled 2012-03-23: qty 1

## 2012-03-23 MED ORDER — HYDROCODONE-ACETAMINOPHEN 5-325 MG PO TABS
1.0000 | ORAL_TABLET | Freq: Three times a day (TID) | ORAL | Status: DC
  Administered 2012-03-23: 1 via ORAL
  Filled 2012-03-23: qty 1

## 2012-03-23 NOTE — Progress Notes (Addendum)
[facility]                                                                                Patient Demographics  [name], is a 71 y.o. male  WUJ:[id number]  NFA:[id number]  DOB - Jul 02, 1941  Admit date - 03/11/2012  Admitting Physician Laveda Norman, MD  Outpatient Primary MD for the patient is Delorse Lek, MD  LOS - 12   No chief complaint on file.       Subjective:   [name] today has, No headache, No chest pain,  No Nausea, No new weakness tingling or numbness, No Cough - SOB.  Is in chair. She is having postop abdominal pain which is generalized.  Objective:   Filed Vitals:   03/22/12 1515 03/22/12 1559 03/22/12 2048 03/23/12 0608  BP: 101/52 151/62 139/71 159/69  Pulse: 61 60 74 77  Temp: 97.8 F (36.6 C) 98 F (36.7 C) 98.7 F (37.1 C) 99.7 F (37.6 C)  TempSrc:  Oral Oral Oral  Resp: 18 19 18 18   Height:      Weight:      SpO2: 95% 95% 98% 95%    Wt Readings from Last 3 Encounters:  03/19/12 67 kg (147 lb 11.3 oz)  03/19/12 67 kg (147 lb 11.3 oz)  03/19/12 67 kg (147 lb 11.3 oz)     Intake/Output Summary (Last 24 hours) at 03/23/12 0936 Last data filed at 03/23/12 0445  Gross per 24 hour  Intake   1500 ml  Output   1750 ml  Net   -250 ml    Exam Awake Alert,  No new F.N deficits, Normal affect [place].AT,PERRAL Supple Neck,No JVD, No cervical lymphadenopathy appriciated.  Symmetrical Chest wall movement, Good air movement bilaterally, CTAB RRR,No Gallops,Rubs or new Murmurs, No Parasternal Heave No B.Sounds, Abd Soft, mild generalized tenderness, No organomegaly appriciated, postop site under bandage looks stable. No Cyanosis, Clubbing or edema, No new Rash or bruise     Data Review  CBC  Lab 03/23/12 0525 03/22/12 0545 03/20/12 2013 03/20/12 0500 03/18/12 0448  WBC 10.3 5.9 -- 5.5 6.9  HGB 9.9* 9.4* 10.0* 9.5* 9.4*  HCT 31.0* 28.8* 31.2* 29.6* 28.7*  PLT 247 235 -- 230 243  MCV 77.1* 79.1 -- 80.2 79.5    MCH 24.6* 25.8* -- 25.7* 26.0  MCHC 31.9 32.6 -- 32.1 32.8  RDW 14.4 15.0 -- 15.5 15.4  LYMPHSABS -- -- -- -- --  MONOABS -- -- -- -- --  EOSABS -- -- -- -- --  BASOSABS -- -- -- -- --  BANDABS -- -- -- -- --    Chemistries   Lab 03/23/12 0525 03/22/12 0545 03/21/12 2214 03/21/12 0555 03/20/12 0500  NA 134* 139 139 142 140  K 3.8 3.8 3.5 3.8 3.8  CL 101 107 106 108 106  CO2 22 22 22 23 25   GLUCOSE 164* 141* 109* 120* 121*  BUN 7 9 10 11 13   CREATININE 0.73 0.85 0.73 0.80 0.97  CALCIUM 8.6 8.5 8.5 8.9 8.8  MG -- -- -- 1.8 --  AST -- -- -- -- --  ALT -- -- -- -- --  ALKPHOS -- -- -- -- --  BILITOT -- -- -- -- --   ------------------------------------------------------------------------------------------------------------------ estimated creatinine clearance is 74.7 ml/min (by C-G formula based on Cr of 0.73). ------------------------------------------------------------------------------------------------------------------ No results found for this basename: HGBA1C:2 in the last 72 hours ------------------------------------------------------------------------------------------------------------------ No results found for this basename: CHOL:2,HDL:2,LDLCALC:2,TRIG:2,CHOLHDL:2,LDLDIRECT:2 in the last 72 hours ------------------------------------------------------------------------------------------------------------------ No results found for this basename: TSH,T4TOTAL,FREET3,T3FREE,THYROIDAB in the last 72 hours ------------------------------------------------------------------------------------------------------------------ No results found for this basename: VITAMINB12:2,FOLATE:2,FERRITIN:2,TIBC:2,IRON:2,RETICCTPCT:2 in the last 72 hours  Coagulation profile  Lab 03/19/12 0525  INR 1.27  PROTIME --    No results found for this basename: DDIMER:2 in the last 72 hours  Cardiac Enzymes No results found for this basename: CK:3,CKMB:3,TROPONINI:3,MYOGLOBIN:3 in the last 168  hours ------------------------------------------------------------------------------------------------------------------ No components found with this basename: POCBNP:3  Micro Results No results found for this or any previous visit (from the past 240 hour(s)).  Radiology Reports Ct Abdomen Pelvis W Contrast  03/11/2012  *RADIOLOGY REPORT*  Clinical Data: 71 year old male with left lower quadrant abdominal pain.  History of kidney stones.  CT ABDOMEN AND PELVIS WITH CONTRAST  Technique:  Multidetector CT imaging of the abdomen and pelvis was performed following the standard protocol during bolus administration of intravenous contrast.  Contrast: 80mL OMNIPAQUE IOHEXOL 300 MG/ML  SOLN  Comparison: Abdominal radiographs 03/07/2012.  Findings: Dependent opacity and crowding of markings at the lung bases compatible with atelectasis.  Mild cardiomegaly.  No pericardial or pleural effusion.  Sequelae median sternotomy. Degenerative changes in the spine. No acute osseous abnormality identified.  No pelvic free fluid.  The bladder is distended and yet there is evidence of bladder wall thickening.  No perivesical stranding. The prostate is enlarged and lobulated and there is mild contour deformity at the base of the bladder.  Negative distal colon aside from retained stool.  Oral contrast has reached the splenic flexure.  No wall thickening or inflammation of the left colon.  Contrast mixed with stool throughout the more proximal colon.  There is a the crescent-shaped fungating soft tissue mass arising in the posterior wall of the right colon on series 5 image 30 measuring 22 x 46 x 23 mm (AP by transverse by CC, coronal image 45, sagittal image 20).  A conspicuous but small lymph node in the adjacent mesentery measures 5 mm in short axis (series 2 image 52). No mesenteric lymphadenopathy.  This lesion is nonobstructing.  The lesion is only 1 to 2 cm distal to the ileocecal valve which is best demonstrated on  coronal image 33. Distal small bowel loops are nondilated.  There may be mild distal small bowel wall thickening not involving the terminal ileum in the right lower quadrant (series 2 image 66) but this may be artifact of peristalsis.  No dilated small bowel.  Negative stomach, duodenum, spleen, pancreas, and adrenal glands.  No liver mass identified.  Dependent cholelithiasis.  No pericholecystic inflammation. Multiple low-density areas in both kidneys probably are benign cyst.  There is a 7-8 mm calculus located in the right renal pelvis but not causing obstruction at this time.  No hydronephrosis or hydroureter.  Mild perinephric stranding may be age related.  No abdominal free fluid or lymphadenopathy.  Atherosclerosis of the abdominal aorta and iliac arteries.  IMPRESSION: 1.  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 22 x 46 x 23 mm soft tissue mass.  Small but conspicuous mesenteric lymph node nearby measuring 5 mm short axis.  No liver or distant metastatic disease identified in the abdomen pelvis. 2.  Kidney stone at the right renal pelvis but without associated obstruction at this time. 3.  The bladder is distended with evidence of wall thickening.  The prostate is enlarged.  Chronic bladder outlet obstruction suspected and urology follow up to include prostate evaluation recommended. 4.  Cholelithiasis.  Aortic atherosclerosis.  Salient findings discussed by telephone with provider Maren Reamer on 03/11/2012 at  Original Report Authenticated By: Harley Hallmark, M.D.   Nm Myocar Multi W/spect W/wall Motion / Ef  03/17/2012  *RADIOLOGY REPORT*  Clinical Data:  Chest pain  Technique:  Standard myocardial SPECT imaging performed after resting intravenous injection of 10 mCi Tc-23m tetrofosmin . Subsequently, intravenous infusion of Lexiscan performed under the supervision of the Cardiology staff.  At peak effect of the drug, 30  mCi Tc-38mtetrofosmin injected intravenously and standard myocardial SPECT imaging performed.  Quantitative gated imaging also performed to evaluate left ventricular wall motion and estimate left ventricular ejection fraction.  Comparison:  None  MYOCARDIAL IMAGING WITH SPECT (REST AND PHARMACOLOGIC-STRESS)  Findings:  There is a small area of mild reversibility involving the apical segment of the anterior wall.  The summed difference score is equal to one.  This likely reflects nonuniform soft tissue attenuation artifact.  GATED LEFT VENTRICULAR WALL MOTION STUDY  Findings:  Review of the gated images demonstrates moderate hypokinesis involving the mid and apical segments of the anterior septum is noted.  There is mild hypokinesis involving the basilar segments of the lateral wall.  Normal wall thickening.  LEFT VENTRICULAR EJECTION FRACTION  Findings:  QGS ejection fraction measures 61% , with an end- diastolic volume of 84 ml and an end-systolic volume of 33 ml.  IMPRESSION:  1. Small area of mild reversibility involving the apical segment of the anterior wall.  This has a summed difference score equal to one and likely reflects nonuniform soft tissue attenuation artifact. True myocardial ischemia is considered less favored.  2.  Anterior septal hypokinesis. 3.  Left ventricular ejection fraction equals 61%.  Original Report Authenticated By: Rosealee Albee, M.D.    Scheduled Meds:    . alvimopan  12 mg Oral BID  . aspirin  81 mg Oral Daily  . cefOXitin  1 g Intravenous Q6H  . cefOXitin  2 g Intravenous 60 min Pre-Op  . enoxaparin (LOVENOX) injection  40 mg Subcutaneous Q24H  . ezetimibe-simvastatin  1 tablet Oral QHS  . feeding supplement  1 Container Oral BID BM  . insulin aspart  0-9 Units Subcutaneous Q4H  . LORazepam  2 mg Oral QHS  . morphine      . sodium chloride  1,000 mL Intravenous Once  . Tamsulosin HCl  0.4 mg Oral QPC breakfast  . DISCONTD: insulin aspart  0-5 Units Subcutaneous  QHS  . DISCONTD: insulin aspart  0-9 Units Subcutaneous TID WC  . DISCONTD: multivitamin with minerals  1 tablet Oral Daily  . DISCONTD: sodium chloride  250 mL Intravenous Once  . DISCONTD: sodium chloride  3 mL Intravenous Q12H  . DISCONTD: vitamin C  500 mg Oral BID  . DISCONTD: zinc sulfate  220 mg Oral Daily   Continuous Infusions:    . sodium chloride    . DISCONTD: sodium chloride 100 mL/hr at 03/22/12 0953  . DISCONTD: dextrose 5 % and 0.45 % NaCl with KCl 20 mEq/L 100 mL/hr at 03/22/12 1700  . DISCONTD: lactated ringers  PRN Meds:.acetaminophen, acetaminophen, acetaminophen, albuterol, HYDROmorphone (DILAUDID) injection, ipratropium, morphine injection, nitroGLYCERIN, ondansetron (ZOFRAN) IV, ondansetron, oxyCODONE, DISCONTD: 0.9 % irrigation (POUR BTL), DISCONTD:  HYDROmorphone (DILAUDID) injection, DISCONTD:  HYDROmorphone (DILAUDID) injection, DISCONTD: ondansetron (ZOFRAN) IV, DISCONTD: ondansetron (ZOFRAN) IV, DISCONTD: ondansetron, DISCONTD: povidone-iodine  Assessment & Plan    Assuming care of the patient on 03/20/2012 on day 9 of his hospital stay.    1. Abdominal pain/Colonic mass: Patient presents with approximately one week of altered bowel habit/constipation, left lower quadrant abdominal pain, and a low grade pyrexia>was empirically treated with amoxicillin. Ciprofloxacin/Flagyl started for poss. Diverticulitis on admit. Abdominal/pelvic CT scan showed findings most consistent with adenocarcinoma of the cecum/ascending colon, 22 x 46 x 23 mm soft tissue mass. Small but conspicuous mesenteric lymph node nearby measuring 5 mm short axis. No liver or distant metastatic disease identified in the abdomen pelvis. Dr Lina Sar saw pt, and colonoscopy was done 6/26 - mass in ascending colon. and CEA Of 03/12/12  Is 3.2. Status post right hemicolectomy by Dr. Jimmye Norman on 03/11/2012. Currently n.p.o. receiving IV fluids. Will monitor closely for pain control surgery  following the patient. Incentives spirometry every hour while awake.    2. Dehydration: resolved, IVF as NPO. Will change to normal saline as sodium slightly low on 03/23/2012.     3. Anemia, Fe def: Patient has a normocytic anemia, which appeared mild at presentation at 9.4 . hemoglobin dropped to 7.1 and he had been transfused earlier in his hospital stay. hgb improved s/p transfusion, stable. Goal Hb>8 due to CAD burden. Will monitor CBC closely.     4. CAD (coronary artery disease)/+stress test: Patient has a known history of CAD, s/p MI,s/p CABG. stress test positive, left heart catheterization done on 03-2012 suggestive of multivessel stenosis, plan is to proceed for surgery with the knowledge that patient will be high-risk candidate for adverse cardiopulmonary outcome. Patient not a good candidate for bare-metal stent as the stenosis is long, drug-eluting stent with Plavix once abdominal surgeries are done and he has recovered from it.     5. Blood pressure: BP runs soft at baseline, currently blood pressure high due to postop pain, will focus on pain control and monitor blood. Will add when necessary IV Lopressor with written parameters. Monitor closely.     6. COPD (chronic obstructive pulmonary disease): Stable/aymptomatic. PRN Nebs-o2.     7. H/O: CVA (cerebrovascular accident): Stable. On low-dose aspirin.     8. Diabetes mellitus: -continue ssi   CBG (last 3)   Basename 03/23/12 0732 03/23/12 0529 03/23/12 0132  GLUCAP 160* 174* 206*      9. Urinary retention which happened earlier during his hospital stay  Patient remains on Foley, will place him on low-dose Flomax try to remove Foley post op.    10. L subclavian stenosis - Vas Surg input once better.    DVT Prophylaxis   SCDs - lovenox   Procedures CT scan abdomen pelvis, Cardiolite stress test, left heart catheterization, Rt Hemicolectomy  Consults Gen. surgery, cardiology,  PT   Leroy Sea M.D on 03/23/2012 at 9:36 AM  Between 7am to 7pm - Pager - [phone]  After 7pm go to [email or web address] - password [email or web address]  And look for the night coverage person covering for me after hours  [facility] Office  [phone]

## 2012-03-23 NOTE — Progress Notes (Signed)
Subjective:  Awake. On clear liquids. T max 99.7. Low blood pressure improves with fluid bolus.  Objective:  Vital Signs in the last 24 hours: Temp:  [97.8 F (36.6 C)-99.7 F (37.6 C)] 99.7 F (37.6 C) (07/06 0608) Pulse Rate:  [56-87] 75  (07/06 1009) Cardiac Rhythm:  [-] Normal sinus rhythm (07/06 0800) Resp:  [16-37] 18  (07/06 0608) BP: (95-159)/(51-71) 95/61 mmHg (07/06 1009) SpO2:  [93 %-99 %] 95 % (07/06 1610) Arterial Line BP: (152-168)/(44-50) 154/46 mmHg (07/05 1400) FiO2 (%):  [30 %] 30 % (07/05 1415)  Physical Exam: BP Readings from Last 1 Encounters:  03/23/12 95/61    Wt Readings from Last 1 Encounters:  03/19/12 67 kg (147 lb 11.3 oz)    Weight change:   HEENT: [place]/AT, Eyes- PERL, EOMI, Conjunctiva-Pale pink, Sclera-Non-icteric Neck: No JVD, No bruit, Trachea midline. Lungs:  Clear, Bilateral. Cardiac:  Regular rhythm, normal S1 and S2, no S3.  Abdomen:  Soft, mildly-tender. Extremities:  No edema present. No cyanosis. No clubbing. CNS: AxOx3, Cranial nerves grossly intact, moves all 4 extremities. Right handed. Skin: Warm and dry.   Intake/Output from previous day: 07/05 0701 - 07/06 0700 In: 1500 [I.V.:1500] Out: 1753 [Urine:1700; Stool:3; Blood:50]    Lab Results: BMET    Component Value Date/Time   NA 134* 03/23/2012 0525   K 3.8 03/23/2012 0525   CL 101 03/23/2012 0525   CO2 22 03/23/2012 0525   GLUCOSE 164* 03/23/2012 0525   BUN 7 03/23/2012 0525   CREATININE 0.73 03/23/2012 0525   CALCIUM 8.6 03/23/2012 0525   GFRNONAA >90 03/23/2012 0525   GFRAA >90 03/23/2012 0525   CBC    Component Value Date/Time   WBC 10.3 03/23/2012 0525   RBC 4.02* 03/23/2012 0525   HGB 9.9* 03/23/2012 0525   HCT 31.0* 03/23/2012 0525   PLT 247 03/23/2012 0525   MCV 77.1* 03/23/2012 0525   MCH 24.6* 03/23/2012 0525   MCHC 31.9 03/23/2012 0525   RDW 14.4 03/23/2012 0525   CARDIAC ENZYMES Lab Results  Component Value Date   TROPONINI <0.30 12/28/2011    Assessment/Plan:  Patient Active  Hospital Problem List: Right colonic mass probable adenocarcinoma of colon status post right hemicolectomy  Coronary artery disease history of MI in the past status post CABG  Mildly positive Lexiscan Myoview although patient had prolonged ST depression during the stress portion of the test in inferolateral leads.  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 Fluid bolus as needed.    LOS: 12 days    Orpah Cobb  MD  03/23/2012, 1:04 PM

## 2012-03-23 NOTE — Progress Notes (Signed)
[facility]/[name] Progress Note 1 Day Post-Op  Subjective: Patient not having a lot of pain.  Sitting up in chair  Objective: Vital signs in last 24 hours: Temp:  [97.8 F (36.6 C)-99.7 F (37.6 C)] 99.7 F (37.6 C) (07/06 0608) Pulse Rate:  [56-87] 75  (07/06 1009) Resp:  [16-37] 18  (07/06 0608) BP: (95-159)/(51-71) 95/61 mmHg (07/06 1009) SpO2:  [93 %-99 %] 95 % (07/06 1610) Arterial Line BP: (152-168)/(44-50) 154/46 mmHg (07/05 1400) FiO2 (%):  [30 %] 30 % (07/05 1415) Last BM Date: 03/22/12  Intake/Output from previous day: 07/05 0701 - 07/06 0700 In: 1500 [I.V.:1500] Out: 1753 [Urine:1700; Stool:3; Blood:50] Intake/Output this shift:    General: Up in chair, does not appear to be in significant distress  Lungs: Clear  Abd: Soft, minimally tender, some bowel sounds  Extremities: No DVT signs or symptoms  Neuro: Same deficits  Lab Results:  @LABLAST2 (wbc:2,hgb:2,hct:2,plt:2) BMET  Basename 03/23/12 0525 03/22/12 0545  NA 134* 139  K 3.8 3.8  CL 101 107  CO2 22 22  GLUCOSE 164* 141*  BUN 7 9  CREATININE 0.73 0.85  CALCIUM 8.6 8.5   PT/INR No results found for this basename: LABPROT:2,INR:2 in the last 72 hours ABG No results found for this basename: PHART:2,PCO2:2,PO2:2,HCO3:2 in the last 72 hours  Studies/Results: Dg Chest Port 1 View  03/23/2012  *RADIOLOGY REPORT*  Clinical Data: Cough.  PORTABLE CHEST - 1 VIEW  Comparison: 01/29/2012.  Findings: Interval borderline enlargement of the cardiac silhouette with increased prominence of the pulmonary vasculature and interstitial markings.  Stable post CABG changes.  Diffuse osteopenia.  IMPRESSION: Interval borderline cardiomegaly and mild changes of congestive heart failure.  Original Report Authenticated By: Darrol Angel, M.D.    Anti-infectives: Anti-infectives     Start     Dose/Rate Route Frequency Ordered Stop   03/22/12 1800   cefOXitin (MEFOXIN) 1 g in dextrose 5 % 50 mL IVPB        1 g 100 mL/hr  over 30 Minutes Intravenous Every 6 hours 03/22/12 1606 03/23/12 0645   03/22/12 0000   cefOXitin (MEFOXIN) 2 g in dextrose 5 % 50 mL IVPB        2 g 100 mL/hr over 30 Minutes Intravenous 60 min pre-op 03/21/12 1336 03/22/12 1145   03/21/12 1330   cefOXitin (MEFOXIN) 2 g in dextrose 5 % 50 mL IVPB  Status:  Discontinued        2 g 100 mL/hr over 30 Minutes Intravenous 60 min pre-op 03/21/12 1330 03/21/12 1336   03/21/12 0600   erythromycin (E-MYCIN) tablet 1,000 mg        1,000 mg Oral 4 times per day 03/20/12 0939 03/22/12 0015   03/21/12 0600   neomycin (MYCIFRADIN) tablet 1,000 mg        1,000 mg Oral 4 times per day 03/20/12 0939 03/22/12 0016   03/11/12 2000   ciprofloxacin (CIPRO) IVPB 400 mg  Status:  Discontinued        400 mg 200 mL/hr over 60 Minutes Intravenous Every 12 hours 03/11/12 1711 03/20/12 0939   03/11/12 2000   metroNIDAZOLE (FLAGYL) IVPB 500 mg  Status:  Discontinued        500 mg 100 mL/hr over 60 Minutes Intravenous Every 8 hours 03/11/12 1711 03/20/12 0939          Assessment/Plan: s/p Procedure(s): PARTIAL COLECTOMY CPM  LOS: 12 days   Marta Lamas. Gae Bon, MD, FACS [phone] [facility] 03/23/2012

## 2012-03-23 NOTE — Progress Notes (Signed)
Pt more confused than this am.  Woke up and asked "where am I?"  Pt states he feels dizzy.  He is more lethargic also.  VS stable. Rapid Response RN Angelique Blonder) called and asked to come assess pt.  Notified oncoming RN of the change.  Will continue to monitor pt.

## 2012-03-23 NOTE — Progress Notes (Signed)
Physical Therapy Treatment Patient Details Name: [name] MRN: [id number] DOB: 1941/08/23 Today's Date: 03/23/2012 Time: 7829-5621 PT Time Calculation (min): 21 min  PT Assessment / Plan / Recommendation Comments on Treatment Session  Patient required increased assist today with mobility and gait.  Limited due to pain from surgery and decreased activity tolerance.  Balance poor with standing/gait today.  Recommend ST-SNF at discharge for continued therapy prior to return home.    Follow Up Recommendations  Skilled nursing facility    Barriers to Discharge        Equipment Recommendations  Defer to next venue    Recommendations for Other Services    Frequency Min 3X/week   Plan Discharge plan needs to be updated;Frequency remains appropriate    Precautions / Restrictions Precautions Precautions: Fall Restrictions Weight Bearing Restrictions: No   Pertinent Vitals/Pain Surgical pain limiting session today.    Mobility  Bed Mobility Bed Mobility: Supine to Sit Supine to Sit: 4: Min assist;HOB elevated Details for Bed Mobility Assistance: Tactile cues for technique.  Assist to raise trunk from bed. Transfers Transfers: Sit to Stand;Stand to Sit Sit to Stand: 4: Min assist;With upper extremity assist;From bed Stand to Sit: 4: Min assist;With upper extremity assist;With armrests;To chair/3-in-1 Details for Transfer Assistance: Patient required assist for safety/balance.  Minimal use of RUE to assist with safe transfers.  Physical assist to safely sit in chair. Ambulation/Gait Ambulation/Gait Assistance: 4: Min assist Ambulation Distance (Feet): 5 Feet Assistive device: Rolling walker Ambulation/Gait Assistance Details: Assist to maneuver RW.  Assist for balance and upright posture - patient leaning to right side.  Verbal cues to stand upright during gait.   Gait Pattern: Step-through pattern;Decreased stride length;Shuffle;Trunk flexed Gait velocity: Slow gait speed      PT Goals Acute Rehab PT Goals PT Goal: Supine/Side to Sit - Progress: Progressing toward goal PT Goal: Sit to Stand - Progress: Progressing toward goal PT Goal: Ambulate - Progress: Progressing toward goal  Visit Information  Last PT Received On: 03/23/12 Assistance Needed: +1    Subjective Data  Subjective: "OK" When asked if he would work with PT.   Cognition  Overall Cognitive Status: Difficult to assess Area of Impairment: Safety/judgement;Problem solving Difficult to assess due to: Impaired communication Arousal/Alertness: Lethargic Behavior During Session: [facility] for tasks performed Safety/Judgement: Decreased awareness of need for assistance;Impulsive Safety/Judgement - Other Comments: Began standing before PT prepared RW. Problem Solving: Unable to demonstrate how to use call bell    Balance     End of Session PT - End of Session Equipment Utilized During Treatment: Gait belt;Oxygen Activity Tolerance: Patient limited by fatigue;Patient limited by pain Patient left: in chair;with call bell/phone within reach Nurse Communication: Mobility status   GP     Vena Austria 03/23/2012, 11:07 AM Durenda Hurt. Renaldo Fiddler, [facility] Acute Rehab Services Pager [phone]

## 2012-03-24 LAB — CBC
Platelets: 243 10*3/uL (ref 150–400)
RBC: 3.7 MIL/uL — ABNORMAL LOW (ref 4.22–5.81)
RDW: 14.9 % (ref 11.5–15.5)
WBC: 7.3 10*3/uL (ref 4.0–10.5)

## 2012-03-24 LAB — BASIC METABOLIC PANEL
Calcium: 8.7 mg/dL (ref 8.4–10.5)
Creatinine, Ser: 0.77 mg/dL (ref 0.50–1.35)
GFR calc Af Amer: >90 mL/min (ref >90)
GFR calc non Af Amer: 90 mL/min — ABNORMAL LOW (ref >90)
Sodium: 141 mEq/L (ref 135–145)

## 2012-03-24 LAB — GLUCOSE, CAPILLARY
Glucose-Capillary: 146 mg/dL — ABNORMAL HIGH (ref 70–99)
Glucose-Capillary: 126 mg/dL — ABNORMAL HIGH (ref 70–99)

## 2012-03-24 LAB — MAGNESIUM: Magnesium: 1.8 mg/dL (ref 1.5–2.5)

## 2012-03-24 MED ORDER — INSULIN ASPART 100 UNIT/ML ~~LOC~~ SOLN
0.0000 [IU] | Freq: Three times a day (TID) | SUBCUTANEOUS | Status: DC
Start: 2012-03-24 — End: 2012-03-28
  Administered 2012-03-24 – 2012-03-25 (×3): 1 [IU] via SUBCUTANEOUS
  Administered 2012-03-25: 2 [IU] via SUBCUTANEOUS
  Administered 2012-03-25: 1 [IU] via SUBCUTANEOUS
  Administered 2012-03-26: 5 [IU] via SUBCUTANEOUS
  Administered 2012-03-26 – 2012-03-27 (×2): 2 [IU] via SUBCUTANEOUS
  Administered 2012-03-27: 5 [IU] via SUBCUTANEOUS
  Administered 2012-03-27 – 2012-03-28 (×2): 1 [IU] via SUBCUTANEOUS
  Administered 2012-03-28: 3 [IU] via SUBCUTANEOUS

## 2012-03-24 MED ORDER — HYDROCODONE-ACETAMINOPHEN 5-325 MG PO TABS
1.0000 | ORAL_TABLET | Freq: Three times a day (TID) | ORAL | Status: AC
  Administered 2012-03-24 – 2012-03-25 (×4): 1 via ORAL
  Filled 2012-03-24 (×4): qty 1

## 2012-03-24 MED ORDER — SODIUM CHLORIDE 0.9 % IV SOLN: INTRAVENOUS | Status: DC

## 2012-03-24 NOTE — Progress Notes (Addendum)
[facility]                                                                                Patient Demographics  [name], is a 71 y.o. male  ZOX:[id number]  [id number]  DOB - 07-02-1941  Admit date - 03/11/2012  Admitting Physician Laveda Norman, MD  Outpatient Primary MD for the patient is Delorse Lek, MD  LOS - 13   No chief complaint on file.       Subjective:   [name] today has, No headache, No chest pain,  No Nausea, No new weakness tingling or numbness, No Cough - SOB.  Is in chair. His postop of down her pain is much improved.  Objective:   Filed Vitals:   03/23/12 2100 03/23/12 2232 03/24/12 0215 03/24/12 0500  BP: 159/63  141/68 155/65  Pulse: 85  94 84  Temp: 100.2 F (37.9 C) 99 F (37.2 C) 99.4 F (37.4 C) 98.9 F (37.2 C)  TempSrc:      Resp: 20  18 20   Height:      Weight:      SpO2: 95%  96% 95%    Wt Readings from Last 3 Encounters:  03/19/12 67 kg (147 lb 11.3 oz)  03/19/12 67 kg (147 lb 11.3 oz)  03/19/12 67 kg (147 lb 11.3 oz)     Intake/Output Summary (Last 24 hours) at 03/24/12 0926 Last data filed at 03/24/12 0500  Gross per 24 hour  Intake   2875 ml  Output   3360 ml  Net   -485 ml    Exam Awake Alert,  No new F.N deficits, Normal affect [name],PERRAL Supple Neck,No JVD, No cervical lymphadenopathy appriciated.  Symmetrical Chest wall movement, Good air movement bilaterally, CTAB RRR,No Gallops,Rubs or new Murmurs, No Parasternal Heave No B.Sounds, Abd Soft, mild generalized tenderness, No organomegaly appriciated, postop site under bandage looks stable. No Cyanosis, Clubbing or edema, No new Rash or bruise     Data Review  CBC  Lab 03/24/12 0500 03/23/12 1306 03/23/12 0525 03/22/12 0545 03/20/12 2013 03/20/12 0500 03/18/12 0448  WBC 7.3 -- 10.3 5.9 -- 5.5 6.9  HGB 9.2* 10.0* 9.9* 9.4* 10.0* -- --  HCT 29.1* 31.3* 31.0* 28.8* 31.2* -- --  PLT 243 -- 247 235 -- 230 243  MCV 78.6 --  77.1* 79.1 -- 80.2 79.5  MCH 24.9* -- 24.6* 25.8* -- 25.7* 26.0  MCHC 31.6 -- 31.9 32.6 -- 32.1 32.8  RDW 14.9 -- 14.4 15.0 -- 15.5 15.4  LYMPHSABS -- -- -- -- -- -- --  MONOABS -- -- -- -- -- -- --  EOSABS -- -- -- -- -- -- --  BASOSABS -- -- -- -- -- -- --  BANDABS -- -- -- -- -- -- --    Chemistries   Lab 03/24/12 0500 03/23/12 0525 03/22/12 0545 03/21/12 2214 03/21/12 0555  NA 141 134* 139 139 142  K 3.6 3.8 3.8 3.5 3.8  CL 108 101 107 106 108  CO2 24 22 22 22 23   GLUCOSE 145* 164* 141* 109* 120*  BUN 7 7 9 10 11   CREATININE 0.77 0.73 0.85 0.73  0.80  CALCIUM 8.7 8.6 8.5 8.5 8.9  MG 1.8 -- -- -- 1.8  AST -- -- -- -- --  ALT -- -- -- -- --  ALKPHOS -- -- -- -- --  BILITOT -- -- -- -- --   ------------------------------------------------------------------------------------------------------------------ estimated creatinine clearance is 74.7 ml/min (by C-G formula based on Cr of 0.77). ------------------------------------------------------------------------------------------------------------------ No results found for this basename: HGBA1C:2 in the last 72 hours ------------------------------------------------------------------------------------------------------------------ No results found for this basename: CHOL:2,HDL:2,LDLCALC:2,TRIG:2,CHOLHDL:2,LDLDIRECT:2 in the last 72 hours ------------------------------------------------------------------------------------------------------------------ No results found for this basename: TSH,T4TOTAL,FREET3,T3FREE,THYROIDAB in the last 72 hours ------------------------------------------------------------------------------------------------------------------ No results found for this basename: VITAMINB12:2,FOLATE:2,FERRITIN:2,TIBC:2,IRON:2,RETICCTPCT:2 in the last 72 hours  Coagulation profile  Lab 03/19/12 0525  INR 1.27  PROTIME --    No results found for this basename: DDIMER:2 in the last 72 hours  Cardiac Enzymes No  results found for this basename: CK:3,CKMB:3,TROPONINI:3,MYOGLOBIN:3 in the last 168 hours ------------------------------------------------------------------------------------------------------------------ No components found with this basename: POCBNP:3  Micro Results No results found for this or any previous visit (from the past 240 hour(s)).  Radiology Reports Ct Abdomen Pelvis W Contrast  03/11/2012  *RADIOLOGY REPORT*  Clinical Data: 71 year old male with left lower quadrant abdominal pain.  History of kidney stones.  CT ABDOMEN AND PELVIS WITH CONTRAST  Technique:  Multidetector CT imaging of the abdomen and pelvis was performed following the standard protocol during bolus administration of intravenous contrast.  Contrast: 80mL OMNIPAQUE IOHEXOL 300 MG/ML  SOLN  Comparison: Abdominal radiographs 03/07/2012.  Findings: Dependent opacity and crowding of markings at the lung bases compatible with atelectasis.  Mild cardiomegaly.  No pericardial or pleural effusion.  Sequelae median sternotomy. Degenerative changes in the spine. No acute osseous abnormality identified.  No pelvic free fluid.  The bladder is distended and yet there is evidence of bladder wall thickening.  No perivesical stranding. The prostate is enlarged and lobulated and there is mild contour deformity at the base of the bladder.  Negative distal colon aside from retained stool.  Oral contrast has reached the splenic flexure.  No wall thickening or inflammation of the left colon.  Contrast mixed with stool throughout the more proximal colon.  There is a the crescent-shaped fungating soft tissue mass arising in the posterior wall of the right colon on series 5 image 30 measuring 22 x 46 x 23 mm (AP by transverse by CC, coronal image 45, sagittal image 20).  A conspicuous but small lymph node in the adjacent mesentery measures 5 mm in short axis (series 2 image 52). No mesenteric lymphadenopathy.  This lesion is nonobstructing.  The lesion  is only 1 to 2 cm distal to the ileocecal valve which is best demonstrated on coronal image 33. Distal small bowel loops are nondilated.  There may be mild distal small bowel wall thickening not involving the terminal ileum in the right lower quadrant (series 2 image 66) but this may be artifact of peristalsis.  No dilated small bowel.  Negative stomach, duodenum, spleen, pancreas, and adrenal glands.  No liver mass identified.  Dependent cholelithiasis.  No pericholecystic inflammation. Multiple low-density areas in both kidneys probably are benign cyst.  There is a 7-8 mm calculus located in the right renal pelvis but not causing obstruction at this time.  No hydronephrosis or hydroureter.  Mild perinephric stranding may be age related.  No abdominal free fluid or lymphadenopathy.  Atherosclerosis of the abdominal aorta and iliac arteries.  IMPRESSION: 1.  Findings most consistent with adenocarcinoma of the cecum/ascending colon (see series  2 image 46 and coronal image 45). 22 x 46 x 23 mm soft tissue mass.  Small but conspicuous mesenteric lymph node nearby measuring 5 mm short axis.  No liver or distant metastatic disease identified in the abdomen pelvis. 2.  Kidney stone at the right renal pelvis but without associated obstruction at this time. 3.  The bladder is distended with evidence of wall thickening.  The prostate is enlarged.  Chronic bladder outlet obstruction suspected and urology follow up to include prostate evaluation recommended. 4.  Cholelithiasis.  Aortic atherosclerosis.  Salient findings discussed by telephone with provider Maren Reamer on 03/11/2012 at  Original Report Authenticated By: Harley Hallmark, M.D.   Nm Myocar Multi W/spect W/wall Motion / Ef  03/17/2012  *RADIOLOGY REPORT*  Clinical Data:  Chest pain  Technique:  Standard myocardial SPECT imaging performed after resting intravenous injection of 10 mCi Tc-6m tetrofosmin . Subsequently, intravenous infusion of Lexiscan performed  under the supervision of the Cardiology staff.  At peak effect of the drug, 30 mCi Tc-57mtetrofosmin injected intravenously and standard myocardial SPECT imaging performed.  Quantitative gated imaging also performed to evaluate left ventricular wall motion and estimate left ventricular ejection fraction.  Comparison:  None  MYOCARDIAL IMAGING WITH SPECT (REST AND PHARMACOLOGIC-STRESS)  Findings:  There is a small area of mild reversibility involving the apical segment of the anterior wall.  The summed difference score is equal to one.  This likely reflects nonuniform soft tissue attenuation artifact.  GATED LEFT VENTRICULAR WALL MOTION STUDY  Findings:  Review of the gated images demonstrates moderate hypokinesis involving the mid and apical segments of the anterior septum is noted.  There is mild hypokinesis involving the basilar segments of the lateral wall.  Normal wall thickening.  LEFT VENTRICULAR EJECTION FRACTION  Findings:  QGS ejection fraction measures 61% , with an end- diastolic volume of 84 ml and an end-systolic volume of 33 ml.  IMPRESSION:  1. Small area of mild reversibility involving the apical segment of the anterior wall.  This has a summed difference score equal to one and likely reflects nonuniform soft tissue attenuation artifact. True myocardial ischemia is considered less favored.  2.  Anterior septal hypokinesis. 3.  Left ventricular ejection fraction equals 61%.  Original Report Authenticated By: Rosealee Albee, M.D.    Scheduled Meds:    . alvimopan  12 mg Oral BID  . aspirin  81 mg Oral Daily  . enoxaparin (LOVENOX) injection  40 mg Subcutaneous Q24H  . ezetimibe-simvastatin  1 tablet Oral QHS  . feeding supplement  1 Container Oral BID BM  . HYDROcodone-acetaminophen  1 tablet Oral TID  . insulin aspart  0-9 Units Subcutaneous Q4H  . LORazepam  2 mg Oral QHS  . Tamsulosin HCl  0.4 mg Oral QPC breakfast  . DISCONTD: HYDROcodone-acetaminophen  1 tablet Oral TID    Continuous Infusions:    . sodium chloride    . DISCONTD: sodium chloride 75 mL/hr at 03/23/12 1300  . DISCONTD: dextrose 5 % and 0.45 % NaCl with KCl 20 mEq/L 100 mL/hr at 03/22/12 1700   PRN Meds:.acetaminophen, acetaminophen, albuterol, ipratropium, metoprolol, morphine injection, nitroGLYCERIN, ondansetron (ZOFRAN) IV, ondansetron, oxyCODONE, DISCONTD:  HYDROmorphone (DILAUDID) injection, DISCONTD:  morphine injection, DISCONTD: oxyCODONE  Assessment & Plan    Assuming care of the patient on 03/20/2012 on day 9 of his hospital stay.    1. Abdominal pain/Colonic mass ([place]) : Patient presents with approximately one week of altered bowel habit/constipation,  left lower quadrant abdominal pain, and a low grade pyrexia>was empirically treated with amoxicillin. Ciprofloxacin/Flagyl started for poss. Diverticulitis on admit. Abdominal/pelvic CT scan showed findings most consistent with adenocarcinoma of the cecum/ascending colon, 22 x 46 x 23 mm soft tissue mass. Small but conspicuous mesenteric lymph node nearby measuring 5 mm short axis. No liver or distant metastatic disease identified in the abdomen pelvis. Dr Lina Sar saw pt, and colonoscopy was done 6/26 - mass in ascending colon. and CEA Of 03/12/12  was 3.2. He is Status post right hemicolectomy by Dr. Jimmye Norman on 03/22/2012. Taking clear liquids, gentle IV fluids. Much improved pain control,  surgery following the patient. Incentive spirometry every hour while awake. Will call Onco consult.    2. Anemia, Fe def: Patient has a normocytic anemia, which appeared mild at presentation at 9.4 . hemoglobin dropped to 7.1 and he had been transfused earlier in his hospital stay. hgb improved s/p transfusion, stable. Goal Hb>8 due to CAD burden. Will monitor CBC closely. Iron panel has been ordered by cardiology will follow.     3. CAD (coronary artery disease)/+stress test: Patient has a known history of CAD, s/p MI,s/p CABG.  stress test positive, left heart catheterization done on 03-2012 suggestive of multivessel stenosis, plan is to proceed for surgery with the knowledge that patient will be high-risk candidate for adverse cardiopulmonary outcome. Patient not a good candidate for bare-metal stent as the stenosis is long, drug-eluting stent with Plavix once abdominal surgeries are done and he has recovered from it.     4. Blood pressure: BP runs soft at baseline, baseline improved with IV fluids continue gentle hydration  Monitor closely.     5. COPD (chronic obstructive pulmonary disease): Stable/aymptomatic. PRN Nebs-o2.     6. H/O: CVA (cerebrovascular accident): Stable. On low-dose aspirin.     7. Diabetes mellitus: -continue ssi changed to q. a.c.   CBG (last 3)   Basename 03/24/12 0739 03/23/12 2052 03/23/12 1627  GLUCAP 133* 144* 149*      8. Urinary retention which happened earlier during his hospital stay  Patient remains on Foley, will place him on low-dose Flomax try to remove Foley post op the next 24-48 hours.    9. L subclavian stenosis - found incidentally during left heart catheterization.  Will call vascular surgery once patient's acute issues have resolved.    DVT Prophylaxis   SCDs - lovenox   Procedures CT scan abdomen pelvis, Cardiolite stress test, left heart catheterization, Rt Hemicolectomy  Consults Gen. surgery, cardiology, PT   Leroy Sea M.D on 03/24/2012 at 9:26 AM  Between 7am to 7pm - Pager - [phone]  After 7pm go to [email or web address] - password [email or web address]  And look for the night coverage person covering for me after hours  [facility] Office  [phone]

## 2012-03-24 NOTE — Progress Notes (Signed)
Subjective:  No new complaints. Blood pressure improved.  Objective:  Vital Signs in the last 24 hours: Temp:  [98.9 F (37.2 C)-100.2 F (37.9 C)] 98.9 F (37.2 C) (07/07 0500) Pulse Rate:  [66-94] 84  (07/07 0500) Cardiac Rhythm:  [-] Normal sinus rhythm (07/06 1954) Resp:  [18-20] 20  (07/07 0500) BP: (76-159)/(44-72) 155/65 mmHg (07/07 0500) SpO2:  [95 %-97 %] 95 % (07/07 0500)  Physical Exam: BP Readings from Last 1 Encounters:  03/24/12 155/65    Wt Readings from Last 1 Encounters:  03/19/12 67 kg (147 lb 11.3 oz)    Weight change:   HEENT: [place]/AT, Eyes-Brown, PERL, EOMI, Conjunctiva-Pale pink, Sclera-Non-icteric Neck: No JVD, No bruit, Trachea midline. Lungs:  Clear, Bilateral. Cardiac:  Regular rhythm, normal S1 and S2, no S3.  Abdomen:  Soft, non-tender. Extremities:  No edema present. No cyanosis. No clubbing. CNS: AxOx3, Cranial nerves grossly intact, moves all 4 extremities. Right handed. Skin: Warm and dry.   Intake/Output from previous day: 07/06 0701 - 07/07 0700 In: 2875 [I.V.:2875] Out: 3360 [Urine:3360]    Lab Results: BMET    Component Value Date/Time   NA 141 03/24/2012 0500   K 3.6 03/24/2012 0500   CL 108 03/24/2012 0500   CO2 24 03/24/2012 0500   GLUCOSE 145* 03/24/2012 0500   BUN 7 03/24/2012 0500   CREATININE 0.77 03/24/2012 0500   CALCIUM 8.7 03/24/2012 0500   GFRNONAA 90* 03/24/2012 0500   GFRAA >90 03/24/2012 0500   CBC    Component Value Date/Time   WBC 7.3 03/24/2012 0500   RBC 3.70* 03/24/2012 0500   HGB 9.2* 03/24/2012 0500   HCT 29.1* 03/24/2012 0500   PLT 243 03/24/2012 0500   MCV 78.6 03/24/2012 0500   MCH 24.9* 03/24/2012 0500   MCHC 31.6 03/24/2012 0500   RDW 14.9 03/24/2012 0500   CARDIAC ENZYMES Lab Results  Component Value Date   TROPONINI <0.30 12/28/2011    Assessment/Plan:  Patient Active Hospital Problem List: Right colonic mass probable adenocarcinoma of colon status post right hemicolectomy  Coronary artery disease history of MI in the  past status post CABG  Mildly positive Lexiscan Myoview although patient had prolonged ST depression during the stress portion of the test in inferolateral leads.  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 Iron/TIBC   LOS: 13 days    Orpah Cobb  MD  03/24/2012, 8:40 AM

## 2012-03-24 NOTE — Progress Notes (Signed)
2 Days Post-Op  Subjective: No n/v.  No BM or flatus.  Objective: Vital signs in last 24 hours: Temp:  [98.9 F (37.2 C)-100.2 F (37.9 C)] 98.9 F (37.2 C) (07/07 0500) Pulse Rate:  [66-94] 84  (07/07 0500) Resp:  [18-20] 20  (07/07 0500) BP: (76-159)/(44-72) 155/65 mmHg (07/07 0500) SpO2:  [95 %-97 %] 95 % (07/07 0500) Last BM Date: 03/22/12  Intake/Output from previous day: 07/06 0701 - 07/07 0700 In: 2875 [I.V.:2875] Out: 3360 [Urine:3360] Intake/Output this shift:    PE: Abd-soft, incision clean and intact, active bowel sounds  Lab Results:   Basename 03/24/12 0500 03/23/12 1306 03/23/12 0525  WBC 7.3 -- 10.3  HGB 9.2* 10.0* --  HCT 29.1* 31.3* --  PLT 243 -- 247   BMET  Basename 03/24/12 0500 03/23/12 0525  NA 141 134*  K 3.6 3.8  CL 108 101  CO2 24 22  GLUCOSE 145* 164*  BUN 7 7  CREATININE 0.77 0.73  CALCIUM 8.7 8.6   PT/INR No results found for this basename: LABPROT:2,INR:2 in the last 72 hours Comprehensive Metabolic Panel:    Component Value Date/Time   NA 141 03/24/2012 0500   K 3.6 03/24/2012 0500   CL 108 03/24/2012 0500   CO2 24 03/24/2012 0500   BUN 7 03/24/2012 0500   CREATININE 0.77 03/24/2012 0500   GLUCOSE 145* 03/24/2012 0500   CALCIUM 8.7 03/24/2012 0500   AST 20 03/12/2012 0436   ALT 10 03/12/2012 0436   ALKPHOS 65 03/12/2012 0436   BILITOT 0.2* 03/12/2012 0436   PROT 5.9* 03/12/2012 0436   ALBUMIN 3.0* 03/12/2012 0436     Studies/Results: Dg Chest Port 1 View  03/23/2012  *RADIOLOGY REPORT*  Clinical Data: Cough.  PORTABLE CHEST - 1 VIEW  Comparison: 01/29/2012.  Findings: Interval borderline enlargement of the cardiac silhouette with increased prominence of the pulmonary vasculature and interstitial markings.  Stable post CABG changes.  Diffuse osteopenia.  IMPRESSION: Interval borderline cardiomegaly and mild changes of congestive heart failure.  Original Report Authenticated By: Darrol Angel, M.D.    Anti-infectives: Anti-infectives     Start     Dose/Rate Route Frequency Ordered Stop   03/22/12 1800   cefOXitin (MEFOXIN) 1 g in dextrose 5 % 50 mL IVPB        1 g 100 mL/hr over 30 Minutes Intravenous Every 6 hours 03/22/12 1606 03/23/12 0645   03/22/12 0000   cefOXitin (MEFOXIN) 2 g in dextrose 5 % 50 mL IVPB        2 g 100 mL/hr over 30 Minutes Intravenous 60 min pre-op 03/21/12 1336 03/22/12 1145   03/21/12 1330   cefOXitin (MEFOXIN) 2 g in dextrose 5 % 50 mL IVPB  Status:  Discontinued        2 g 100 mL/hr over 30 Minutes Intravenous 60 min pre-op 03/21/12 1330 03/21/12 1336   03/21/12 0600   erythromycin (E-MYCIN) tablet 1,000 mg        1,000 mg Oral 4 times per day 03/20/12 0939 03/22/12 0015   03/21/12 0600   neomycin (MYCIFRADIN) tablet 1,000 mg        1,000 mg Oral 4 times per day 03/20/12 0939 03/22/12 0016   03/11/12 2000   ciprofloxacin (CIPRO) IVPB 400 mg  Status:  Discontinued        400 mg 200 mL/hr over 60 Minutes Intravenous Every 12 hours 03/11/12 1711 03/20/12 0939   03/11/12 2000   metroNIDAZOLE (FLAGYL)  IVPB 500 mg  Status:  Discontinued        500 mg 100 mL/hr over 60 Minutes Intravenous Every 8 hours 03/11/12 1711 03/20/12 0939          Assessment Principal Problem:  Right colon cancer s/p right colectomy 7/5 Active Problems:  Anemia  CAD (coronary artery disease)  HTN (hypertension)  COPD (chronic obstructive pulmonary disease)  H/O: CVA (cerebrovascular accident)  Diabetes mellitus       LOS: 13 days   Plan: Full liquids    Nels Munn J 03/24/2012

## 2012-03-25 ENCOUNTER — Encounter (HOSPITAL_COMMUNITY): Payer: Self-pay | Admitting: General Surgery

## 2012-03-25 LAB — TYPE AND SCREEN
ABO/RH(D): O POS
Donor AG Type: NEGATIVE
Donor AG Type: NEGATIVE
Unit division: 0

## 2012-03-25 LAB — BASIC METABOLIC PANEL
BUN: 9 mg/dL (ref 6–23)
BUN: 9 mg/dL (ref 6–23)
CO2: 26 mEq/L (ref 19–32)
Calcium: 8.8 mg/dL (ref 8.4–10.5)
Chloride: 103 mEq/L (ref 96–112)
Creatinine, Ser: 0.76 mg/dL (ref 0.50–1.35)
Creatinine, Ser: 0.79 mg/dL (ref 0.50–1.35)
GFR calc non Af Amer: >90 mL/min (ref >90)
Glucose, Bld: 132 mg/dL — ABNORMAL HIGH (ref 70–99)

## 2012-03-25 LAB — CBC
Hemoglobin: 8.6 g/dL — ABNORMAL LOW (ref 13.0–17.0)
MCHC: 31.4 g/dL (ref 30.0–36.0)
Platelets: 246 10*3/uL (ref 150–400)
RDW: 14.9 % (ref 11.5–15.5)

## 2012-03-25 LAB — IRON AND TIBC
Iron: 56 ug/dL (ref 42–135)
TIBC: 249 ug/dL (ref 215–435)

## 2012-03-25 LAB — GLUCOSE, CAPILLARY: Glucose-Capillary: 177 mg/dL — ABNORMAL HIGH (ref 70–99)

## 2012-03-25 MED ORDER — SODIUM CHLORIDE 0.9 % IV SOLN: INTRAVENOUS | Status: DC | PRN

## 2012-03-25 MED ORDER — SODIUM CHLORIDE 0.9 % IV SOLN: INTRAVENOUS | Status: DC

## 2012-03-25 MED ORDER — SODIUM CHLORIDE 0.9 % IV SOLN
500.0000 mg | Freq: Every day | INTRAVENOUS | Status: AC
  Administered 2012-03-25 – 2012-03-27 (×3): 500 mg via INTRAVENOUS
  Filled 2012-03-25 (×7): qty 10

## 2012-03-25 MED ORDER — POTASSIUM CHLORIDE 10 MEQ/100ML IV SOLN
10.0000 meq | INTRAVENOUS | Status: AC
  Administered 2012-03-25 (×3): 10 meq via INTRAVENOUS
  Filled 2012-03-25 (×4): qty 100

## 2012-03-25 MED ORDER — IRON DEXTRAN 50 MG/ML IJ SOLN
25.0000 mg | Freq: Once | INTRAMUSCULAR | Status: AC
  Administered 2012-03-25: 25 mg via INTRAVENOUS
  Filled 2012-03-25: qty 0.5

## 2012-03-25 NOTE — Progress Notes (Signed)
Bladder scan revealed 754 mL of urine in bladder.  Attempted to in and out cath pt.  Pt was voiding when arrived to room.  Was able to catch 150 mL.  The bed was soaked.  Will continue to monitor.

## 2012-03-25 NOTE — Progress Notes (Addendum)
[facility]                                                                                Patient Demographics  [name], is a 71 y.o. male  YQM:[id number]  BMW:[id number]  DOB - 02-Aug-1941  Admit date - 03/11/2012  Admitting Physician Laveda Norman, MD  Outpatient Primary MD for the patient is Delorse Lek, MD  LOS - 14   No chief complaint on file.       Subjective:   [name] today has, No headache, No chest pain,  No Nausea, No new weakness tingling or numbness, No Cough - SOB.  Is in chair. His postop abdominal pain is much improved.  Objective:   Filed Vitals:   03/24/12 0500 03/24/12 1300 03/24/12 2100 03/25/12 0500  BP: 155/65 152/69 159/65 169/70  Pulse: 84 85 75 79  Temp: 98.9 F (37.2 C) 99.2 F (37.3 C) 99.3 F (37.4 C) 98.5 F (36.9 C)  TempSrc:      Resp: 20 20 18 18   Height:      Weight:      SpO2: 95% 97% 96% 95%    Wt Readings from Last 3 Encounters:  03/19/12 67 kg (147 lb 11.3 oz)  03/19/12 67 kg (147 lb 11.3 oz)  03/19/12 67 kg (147 lb 11.3 oz)     Intake/Output Summary (Last 24 hours) at 03/25/12 0914 Last data filed at 03/25/12 0500  Gross per 24 hour  Intake      0 ml  Output   1700 ml  Net  -1700 ml    Exam Awake Alert,  No new F.N deficits, Normal affect [place].AT,PERRAL Supple Neck,No JVD, No cervical lymphadenopathy appriciated.  Symmetrical Chest wall movement, Good air movement bilaterally, CTAB RRR,No Gallops,Rubs or new Murmurs, No Parasternal Heave No B.Sounds, Abd Soft, mild generalized tenderness, No organomegaly appriciated, postop site under bandage looks stable. No Cyanosis, Clubbing or edema, No new Rash or bruise     Data Review  CBC  Lab 03/25/12 0635 03/24/12 0500 03/23/12 1306 03/23/12 0525 03/22/12 0545 03/20/12 0500  WBC 7.6 7.3 -- 10.3 5.9 5.5  HGB 8.6* 9.2* 10.0* 9.9* 9.4* --  HCT 27.4* 29.1* 31.3* 31.0* 28.8* --  PLT 246 243 -- 247 235 230  MCV 78.1 78.6 -- 77.1* 79.1  80.2  MCH 24.5* 24.9* -- 24.6* 25.8* 25.7*  MCHC 31.4 31.6 -- 31.9 32.6 32.1  RDW 14.9 14.9 -- 14.4 15.0 15.5  LYMPHSABS -- -- -- -- -- --  MONOABS -- -- -- -- -- --  EOSABS -- -- -- -- -- --  BASOSABS -- -- -- -- -- --  BANDABS -- -- -- -- -- --    Chemistries   Lab 03/25/12 0635 03/24/12 0500 03/23/12 0525 03/22/12 0545 03/21/12 2214 03/21/12 0555  NA 139 141 134* 139 139 --  K 3.1* 3.6 3.8 3.8 3.5 --  CL 103 108 101 107 106 --  CO2 26 24 22 22 22  --  GLUCOSE 132* 145* 164* 141* 109* --  BUN 9 7 7 9 10  --  CREATININE 0.76 0.77 0.73 0.85 0.73 --  CALCIUM 8.8 8.7 8.6  8.5 8.5 --  MG 1.6 1.8 -- -- -- 1.8  AST -- -- -- -- -- --  ALT -- -- -- -- -- --  ALKPHOS -- -- -- -- -- --  BILITOT -- -- -- -- -- --   ------------------------------------------------------------------------------------------------------------------ estimated creatinine clearance is 74.7 ml/min (by C-G formula based on Cr of 0.76). ------------------------------------------------------------------------------------------------------------------ No results found for this basename: HGBA1C:2 in the last 72 hours ------------------------------------------------------------------------------------------------------------------ No results found for this basename: CHOL:2,HDL:2,LDLCALC:2,TRIG:2,CHOLHDL:2,LDLDIRECT:2 in the last 72 hours ------------------------------------------------------------------------------------------------------------------ No results found for this basename: TSH,T4TOTAL,FREET3,T3FREE,THYROIDAB in the last 72 hours ------------------------------------------------------------------------------------------------------------------ No results found for this basename: VITAMINB12:2,FOLATE:2,FERRITIN:2,TIBC:2,IRON:2,RETICCTPCT:2 in the last 72 hours  Coagulation profile  Lab 03/19/12 0525  INR 1.27  PROTIME --    No results found for this basename: DDIMER:2 in the last 72 hours  Cardiac  Enzymes No results found for this basename: CK:3,CKMB:3,TROPONINI:3,MYOGLOBIN:3 in the last 168 hours ------------------------------------------------------------------------------------------------------------------ No components found with this basename: POCBNP:3  Micro Results No results found for this or any previous visit (from the past 240 hour(s)).  Radiology Reports Ct Abdomen Pelvis W Contrast  03/11/2012  *RADIOLOGY REPORT*  Clinical Data: 71 year old male with left lower quadrant abdominal pain.  History of kidney stones.  CT ABDOMEN AND PELVIS WITH CONTRAST  Technique:  Multidetector CT imaging of the abdomen and pelvis was performed following the standard protocol during bolus administration of intravenous contrast.  Contrast: 80mL OMNIPAQUE IOHEXOL 300 MG/ML  SOLN  Comparison: Abdominal radiographs 03/07/2012.  Findings: Dependent opacity and crowding of markings at the lung bases compatible with atelectasis.  Mild cardiomegaly.  No pericardial or pleural effusion.  Sequelae median sternotomy. Degenerative changes in the spine. No acute osseous abnormality identified.  No pelvic free fluid.  The bladder is distended and yet there is evidence of bladder wall thickening.  No perivesical stranding. The prostate is enlarged and lobulated and there is mild contour deformity at the base of the bladder.  Negative distal colon aside from retained stool.  Oral contrast has reached the splenic flexure.  No wall thickening or inflammation of the left colon.  Contrast mixed with stool throughout the more proximal colon.  There is a the crescent-shaped fungating soft tissue mass arising in the posterior wall of the right colon on series 5 image 30 measuring 22 x 46 x 23 mm (AP by transverse by CC, coronal image 45, sagittal image 20).  A conspicuous but small lymph node in the adjacent mesentery measures 5 mm in short axis (series 2 image 52). No mesenteric lymphadenopathy.  This lesion is nonobstructing.   The lesion is only 1 to 2 cm distal to the ileocecal valve which is best demonstrated on coronal image 33. Distal small bowel loops are nondilated.  There may be mild distal small bowel wall thickening not involving the terminal ileum in the right lower quadrant (series 2 image 66) but this may be artifact of peristalsis.  No dilated small bowel.  Negative stomach, duodenum, spleen, pancreas, and adrenal glands.  No liver mass identified.  Dependent cholelithiasis.  No pericholecystic inflammation. Multiple low-density areas in both kidneys probably are benign cyst.  There is a 7-8 mm calculus located in the right renal pelvis but not causing obstruction at this time.  No hydronephrosis or hydroureter.  Mild perinephric stranding may be age related.  No abdominal free fluid or lymphadenopathy.  Atherosclerosis of the abdominal aorta and iliac arteries.  IMPRESSION: 1.  Findings most consistent with adenocarcinoma of the cecum/ascending colon (see series  2 image 46 and coronal image 45). 22 x 46 x 23 mm soft tissue mass.  Small but conspicuous mesenteric lymph node nearby measuring 5 mm short axis.  No liver or distant metastatic disease identified in the abdomen pelvis. 2.  Kidney stone at the right renal pelvis but without associated obstruction at this time. 3.  The bladder is distended with evidence of wall thickening.  The prostate is enlarged.  Chronic bladder outlet obstruction suspected and urology follow up to include prostate evaluation recommended. 4.  Cholelithiasis.  Aortic atherosclerosis.  Salient findings discussed by telephone with provider Maren Reamer on 03/11/2012 at  Original Report Authenticated By: Harley Hallmark, M.D.   Nm Myocar Multi W/spect W/wall Motion / Ef  03/17/2012  *RADIOLOGY REPORT*  Clinical Data:  Chest pain  Technique:  Standard myocardial SPECT imaging performed after resting intravenous injection of 10 mCi Tc-58m tetrofosmin . Subsequently, intravenous infusion of Lexiscan  performed under the supervision of the Cardiology staff.  At peak effect of the drug, 30 mCi Tc-64mtetrofosmin injected intravenously and standard myocardial SPECT imaging performed.  Quantitative gated imaging also performed to evaluate left ventricular wall motion and estimate left ventricular ejection fraction.  Comparison:  None  MYOCARDIAL IMAGING WITH SPECT (REST AND PHARMACOLOGIC-STRESS)  Findings:  There is a small area of mild reversibility involving the apical segment of the anterior wall.  The summed difference score is equal to one.  This likely reflects nonuniform soft tissue attenuation artifact.  GATED LEFT VENTRICULAR WALL MOTION STUDY  Findings:  Review of the gated images demonstrates moderate hypokinesis involving the mid and apical segments of the anterior septum is noted.  There is mild hypokinesis involving the basilar segments of the lateral wall.  Normal wall thickening.  LEFT VENTRICULAR EJECTION FRACTION  Findings:  QGS ejection fraction measures 61% , with an end- diastolic volume of 84 ml and an end-systolic volume of 33 ml.  IMPRESSION:  1. Small area of mild reversibility involving the apical segment of the anterior wall.  This has a summed difference score equal to one and likely reflects nonuniform soft tissue attenuation artifact. True myocardial ischemia is considered less favored.  2.  Anterior septal hypokinesis. 3.  Left ventricular ejection fraction equals 61%.  Original Report Authenticated By: Rosealee Albee, M.D.    Scheduled Meds:    . alvimopan  12 mg Oral BID  . aspirin  81 mg Oral Daily  . enoxaparin (LOVENOX) injection  40 mg Subcutaneous Q24H  . ezetimibe-simvastatin  1 tablet Oral QHS  . feeding supplement  1 Container Oral BID BM  . HYDROcodone-acetaminophen  1 tablet Oral TID  . insulin aspart  0-9 Units Subcutaneous TID WC  . LORazepam  2 mg Oral QHS  . potassium chloride  10 mEq Intravenous Q1 Hr x 4  . Tamsulosin HCl  0.4 mg Oral QPC breakfast  .  DISCONTD: HYDROcodone-acetaminophen  1 tablet Oral TID  . DISCONTD: insulin aspart  0-9 Units Subcutaneous Q4H   Continuous Infusions:    . sodium chloride    . DISCONTD: sodium chloride 75 mL/hr at 03/23/12 1300  . DISCONTD: sodium chloride 50 mL/hr at 03/24/12 1029   PRN Meds:.acetaminophen, acetaminophen, albuterol, ipratropium, metoprolol, morphine injection, nitroGLYCERIN, ondansetron (ZOFRAN) IV, ondansetron, oxyCODONE  Assessment & Plan   This is a 71 year old pleasant Caucasian gentleman who was admitted over 2 weeks ago with the per rectum, during his workup he was found to have a cecal mass, he underwent colonoscopy  followed by hemicolectomy done a few days ago, his biopsies were consistent with adenocarcinoma of the colon. Patient also during his preop evaluation was found to have severe coronary artery disease along with left subclavian stenosis, he underwent left heart catheterization by Dr. Sharyn Lull and is due for stent placement at a later date once he is more stable.  Patient is currently postop day 3 is doing much better, is on clear liquid diet, his current issues are postop abdominal pain, monitoring of anemia, he is to be seen by oncology who have been consulted. Once he is stable he will get stent placed by cardiology and will need to see vascular surgery for his subclavian stenosis.    Assumed care of the patient on 03/20/2012 on day 9 of his hospital stay.    1. Abdominal pain/Colonic mass ([place] CA) : Patient presents with approximately one week of altered bowel habit/constipation, left lower quadrant abdominal pain, and a low grade pyrexia>was empirically treated with amoxicillin. Ciprofloxacin/Flagyl started for poss. Diverticulitis on admit. Abdominal/pelvic CT scan showed findings most consistent with adenocarcinoma of the cecum/ascending colon, 22 x 46 x 23 mm soft tissue mass. Small but conspicuous mesenteric lymph node nearby measuring 5 mm short axis. No liver  or distant metastatic disease identified in the abdomen pelvis. Dr Lina Sar saw pt, and colonoscopy was done 6/26 - mass in ascending colon. and CEA Of 03/12/12 was 3.2. Status post right hemicolectomy by Dr. Jimmye Norman on 03/22/2012. Taking clear liquids, gentle IV fluids. Much improved pain control,  surgery following the patient. Incentive spirometry every hour while awake.  Oncology has been consulted they will see the patient soon.    2. Anemia, Fe def: Iron deficiency anemia worsened by blood loss do to adenocarcinoma of the colon - hemoglobin dropped to 7.1 and he had been transfused earlier in his hospital stay. hgb improved s/p transfusion, stable. Goal Hb>8 due to CAD burden. His anemia panel is consistent with severe iron deficiency, IV iron will be given to the patient.    3. CAD (coronary artery disease)/+stress test: Patient has a known history of CAD, s/p MI,s/p CABG. stress test positive, left heart catheterization done on 03-2012 suggestive of multivessel stenosis, plan is to proceed for surgery with the knowledge that patient will be high-risk candidate for adverse cardiopulmonary outcome. Patient not a good candidate for bare-metal stent as the stenosis is long, drug-eluting stent with Plavix once abdominal surgeries are done and he has recovered from it.     4. Blood pressure: BP runs soft at baseline, baseline improved with IV fluids, will hold IV fluids and monitor blood pressures and intake and output.    5. COPD (chronic obstructive pulmonary disease): Stable/aymptomatic. PRN Nebs-o2.     6. H/O: CVA (cerebrovascular accident): Stable. On low-dose aspirin.     7. Diabetes mellitus: -continue ssi changed to q. a.c.   CBG (last 3)   Basename 03/25/12 0723 03/24/12 2058 03/24/12 1614  GLUCAP 128* 183* 126*      8. Urinary retention which happened earlier during his hospital stay  Patient remains on Foley, have placed him on low-dose Flomax , will remove  Foley on 03/25/2012 and check post void residuals.    9. L subclavian stenosis - found incidentally during left heart catheterization.  Will call vascular surgery once patient's acute issues have resolved.     10. Hypokalemia- will replace potassium IV and check BMP and magnesium in the morning.    DVT Prophylaxis  SCDs - lovenox   Procedures CT scan abdomen pelvis, Cardiolite stress test, left heart catheterization, Rt Hemicolectomy  Consults Gen. surgery, cardiology, PT   Leroy Sea M.D on 03/25/2012 at 9:14 AM  Between 7am to 7pm - Pager - [phone]  After 7pm go to [email or web address] - password [email or web address]  And look for the night coverage person covering for me after hours  [facility] Office  [phone]

## 2012-03-25 NOTE — Progress Notes (Signed)
PT Cancellation Note  Treatment cancelled today due to patient's refusal to participate. He states that he feels "not so hot" and declined any out of bed activity.  Edwyna Perfect, PT  Pager [phone]  03/25/2012, 1:53 PM

## 2012-03-25 NOTE — Progress Notes (Signed)
CSW attempted to reach [facility] to determine if facility could extend bed offer to patient when medically stable. Pt family preferred [facility] over [facility] and [facility]. CSW will continue to attempt to reach [facility].   .Clinical social worker continuing to follow pt to assist with pt dc plans and further csw needs.   Catha Gosselin, Theresia Majors  [phone] .03/25/2012 1519pm

## 2012-03-25 NOTE — Progress Notes (Signed)
Patient ID: [name], male   DOB: 02-05-1941, 71 y.o.   MRN: [id number] 3 Days Post-Op  Subjective: Denies flatus or BM Not very active POD#3  Objective: Vital signs in last 24 hours: Temp:  [98.5 F (36.9 C)-99.3 F (37.4 C)] 98.5 F (36.9 C) (07/08 0500) Pulse Rate:  [75-85] 79  (07/08 0500) Resp:  [18-20] 18  (07/08 0500) BP: (152-169)/(65-70) 169/70 mmHg (07/08 0500) SpO2:  [95 %-97 %] 95 % (07/08 0500) Last BM Date: 03/22/12  Intake/Output from previous day: 07/07 0701 - 07/08 0700 In: -  Out: 1700 [Urine:1700] Intake/Output this shift:    PE: Abdomen soft, minimally full, rare BS Incision clean  Lab Results:   Basename 03/25/12 0635 03/24/12 0500  WBC 7.6 7.3  HGB 8.6* 9.2*  HCT 27.4* 29.1*  PLT 246 243   BMET  Basename 03/25/12 0635 03/24/12 0500  NA 139 141  K 3.1* 3.6  CL 103 108  CO2 26 24  GLUCOSE 132* 145*  BUN 9 7  CREATININE 0.76 0.77  CALCIUM 8.8 8.7   PT/INR No results found for this basename: LABPROT:2,INR:2 in the last 72 hours Comprehensive Metabolic Panel:    Component Value Date/Time   NA 139 03/25/2012 0635   K 3.1* 03/25/2012 0635   CL 103 03/25/2012 0635   CO2 26 03/25/2012 0635   BUN 9 03/25/2012 0635   CREATININE 0.76 03/25/2012 0635   GLUCOSE 132* 03/25/2012 0635   CALCIUM 8.8 03/25/2012 0635   AST 20 03/12/2012 0436   ALT 10 03/12/2012 0436   ALKPHOS 65 03/12/2012 0436   BILITOT 0.2* 03/12/2012 0436   PROT 5.9* 03/12/2012 0436   ALBUMIN 3.0* 03/12/2012 0436     Studies/Results: Dg Chest Port 1 View  03/23/2012  *RADIOLOGY REPORT*  Clinical Data: Cough.  PORTABLE CHEST - 1 VIEW  Comparison: 01/29/2012.  Findings: Interval borderline enlargement of the cardiac silhouette with increased prominence of the pulmonary vasculature and interstitial markings.  Stable post CABG changes.  Diffuse osteopenia.  IMPRESSION: Interval borderline cardiomegaly and mild changes of congestive heart failure.  Original Report Authenticated By: Darrol Angel, M.D.    Anti-infectives: Anti-infectives     Start     Dose/Rate Route Frequency Ordered Stop   03/22/12 1800   cefOXitin (MEFOXIN) 1 g in dextrose 5 % 50 mL IVPB        1 g 100 mL/hr over 30 Minutes Intravenous Every 6 hours 03/22/12 1606 03/23/12 0645   03/22/12 0000   cefOXitin (MEFOXIN) 2 g in dextrose 5 % 50 mL IVPB        2 g 100 mL/hr over 30 Minutes Intravenous 60 min pre-op 03/21/12 1336 03/22/12 1145   03/21/12 1330   cefOXitin (MEFOXIN) 2 g in dextrose 5 % 50 mL IVPB  Status:  Discontinued        2 g 100 mL/hr over 30 Minutes Intravenous 60 min pre-op 03/21/12 1330 03/21/12 1336   03/21/12 0600   erythromycin (E-MYCIN) tablet 1,000 mg        1,000 mg Oral 4 times per day 03/20/12 0939 03/22/12 0015   03/21/12 0600   neomycin (MYCIFRADIN) tablet 1,000 mg        1,000 mg Oral 4 times per day 03/20/12 0939 03/22/12 0016   03/11/12 2000   ciprofloxacin (CIPRO) IVPB 400 mg  Status:  Discontinued        400 mg 200 mL/hr over 60 Minutes Intravenous Every 12 hours  03/11/12 1711 03/20/12 0939   03/11/12 2000   metroNIDAZOLE (FLAGYL) IVPB 500 mg  Status:  Discontinued        500 mg 100 mL/hr over 60 Minutes Intravenous Every 8 hours 03/11/12 1711 03/20/12 0939          Assessment Principal Problem:  *Abdominal pain Active Problems:  Anemia  CAD (coronary artery disease)  HTN (hypertension)  COPD (chronic obstructive pulmonary disease)  H/O: CVA (cerebrovascular accident)  Diabetes mellitus  Dehydration  Nonspecific abnormal finding in stool contents  Nonspecific (abnormal) findings on radiological and other examination of gastrointestinal tract  Neoplasm of unspecified nature of digestive system  Benign neoplasm of colon s/p right partial colectomy   LOS: 14 days   Plan:  Continue to encourage increase activity   Caera Enwright A 03/25/2012

## 2012-03-25 NOTE — Progress Notes (Signed)
Subjective:  Denies chest pain or shortness of breath patient tolerating clear liquids  Objective:  Vital Signs in the last 24 hours: Temp:  [98.5 F (36.9 C)-99.3 F (37.4 C)] 98.5 F (36.9 C) (07/08 0500) Pulse Rate:  [75-85] 79  (07/08 0500) Resp:  [18-20] 18  (07/08 0500) BP: (152-169)/(65-70) 169/70 mmHg (07/08 0500) SpO2:  [95 %-97 %] 95 % (07/08 0500)  Intake/Output from previous day: 07/07 0701 - 07/08 0700 In: -  Out: 1700 [Urine:1700] Intake/Output from this shift:    Physical Exam: Neck: no adenopathy, no carotid bruit, no JVD and supple, symmetrical, trachea midline Lungs: clear to auscultation bilaterally Heart: regular rate and rhythm, S1, S2 normal, no murmur, click, rub or gallop Abdomen: Soft bowel sounds present minimal generalized tenderness Extremities: extremities normal, atraumatic, no cyanosis or edema  Lab Results:  Basename 03/25/12 0635 03/24/12 0500  WBC 7.6 7.3  HGB 8.6* 9.2*  PLT 246 243    Basename 03/25/12 1020 03/25/12 0635  NA 140 139  K 3.3* 3.1*  CL 103 103  CO2 26 26  GLUCOSE 163* 132*  BUN 9 9  CREATININE 0.79 0.76   No results found for this basename: TROPONINI:2,CK,MB:2 in the last 72 hours Hepatic Function Panel No results found for this basename: PROT,ALBUMIN,AST,ALT,ALKPHOS,BILITOT,BILIDIR,IBILI in the last 72 hours No results found for this basename: CHOL in the last 72 hours No results found for this basename: PROTIME in the last 72 hours  Imaging: Imaging results have been reviewed and No results found.  Cardiac Studies:  Assessment/Plan:  Right colonic  adenocarcinoma of colon status post right hemicolectomy postop day 3 doing well Coronary artery disease history of MI in the past status post CABG  Mildly positive Lexiscan Myoview although patient had prolonged ST depression during the stress portion of the test in inferolateral leads.  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  LOS: 14 days    Lakeasha Petion N 03/25/2012, 12:20 PM

## 2012-03-25 NOTE — Progress Notes (Signed)
MEDICATION RELATED CONSULT NOTE - INITIAL   Pharmacy Consult for IV iron dextran Indication: iron deficiency anemia  No Known Allergies  Patient Measurements: Height: 5\' 5"  (165.1 cm) Weight: 147 lb 11.3 oz (67 kg) IBW/kg (Calculated) : 61.5   Vital Signs: Temp: 98.5 F (36.9 C) (07/08 0500) BP: 169/70 mmHg (07/08 0500) Pulse Rate: 79  (07/08 0500) Intake/Output from previous day: 07/07 0701 - 07/08 0700 In: -  Out: 1700 [Urine:1700] Intake/Output from this shift:    Labs:  Basename 03/25/12 0635 03/24/12 0500 03/23/12 1306 03/23/12 0525  WBC 7.6 7.3 -- 10.3  HGB 8.6* 9.2* 10.0* --  HCT 27.4* 29.1* 31.3* --  PLT 246 243 -- 247  APTT -- -- -- --  CREATININE 0.76 0.77 -- 0.73  LABCREA -- -- -- --  CREATININE 0.76 0.77 -- 0.73  CREAT24HRUR -- -- -- --  MG 1.6 1.8 -- --  PHOS -- -- -- --  ALBUMIN -- -- -- --  PROT -- -- -- --  ALBUMIN -- -- -- --  AST -- -- -- --  ALT -- -- -- --  ALKPHOS -- -- -- --  BILITOT -- -- -- --  BILIDIR -- -- -- --  IBILI -- -- -- --   Estimated Creatinine Clearance: 74.7 ml/min (by C-G formula based on Cr of 0.76).  Anemia Panel 03/11/12 Iron- 16 (low) Ferritin- 12 (low)  Medical History: Past Medical History  Diagnosis Date  . Myocardial infarction   . CVA (cerebral infarction)   . Diabetes mellitus   . Hypertension   . Diabetes mellitus   . Aphasia   . Dysphagia   . Stroke   . COPD (chronic obstructive pulmonary disease)   . Shortness of breath   . Anemia     Medications:  Scheduled:    . alvimopan  12 mg Oral BID  . aspirin  81 mg Oral Daily  . enoxaparin (LOVENOX) injection  40 mg Subcutaneous Q24H  . ezetimibe-simvastatin  1 tablet Oral QHS  . feeding supplement  1 Container Oral BID BM  . HYDROcodone-acetaminophen  1 tablet Oral TID  . insulin aspart  0-9 Units Subcutaneous TID WC  . LORazepam  2 mg Oral QHS  . potassium chloride  10 mEq Intravenous Q1 Hr x 4  . Tamsulosin HCl  0.4 mg Oral QPC breakfast     Assessment: Pt. Is s/p colonoscopy and right hemicolectomy. Onc work up in progress for suspected colon CA. H&H has been trending down. Pt. Received transfusion earlier in hospital stay and was stable after. Goal Hgb > 8. Iron panel reveals low iron stores. Asked to replace with IV iron.   Goal of Therapy:  Iron > 42; ferritin >22; %sat > 20% Hgb > 8.   Plan:  IV iron dextran test dose of 25mg  X1 to be given 03/25/2012 If tolerated, will dose 500mg  QDay x3 days.  Suggest oral iron replacement upon discharge.  Brigid Re, PharmD Reagan Behlke, Judie Bonus 03/25/2012,9:53 AM

## 2012-03-26 LAB — GLUCOSE, CAPILLARY
Glucose-Capillary: 141 mg/dL — ABNORMAL HIGH (ref 70–99)
Glucose-Capillary: 150 mg/dL — ABNORMAL HIGH (ref 70–99)
Glucose-Capillary: 123 mg/dL — ABNORMAL HIGH (ref 70–99)

## 2012-03-26 LAB — HEMOGLOBIN AND HEMATOCRIT, BLOOD: Hemoglobin: 9 g/dL — ABNORMAL LOW (ref 13.0–17.0)

## 2012-03-26 LAB — POTASSIUM: Potassium: 3.6 mEq/L (ref 3.5–5.1)

## 2012-03-26 MED ORDER — CLOPIDOGREL BISULFATE 75 MG PO TABS
75.0000 mg | ORAL_TABLET | Freq: Every day | ORAL | Status: DC
  Administered 2012-03-26 – 2012-03-28 (×3): 75 mg via ORAL
  Filled 2012-03-26 (×3): qty 1

## 2012-03-26 MED ORDER — DOCUSATE SODIUM 100 MG PO CAPS
100.0000 mg | ORAL_CAPSULE | Freq: Every day | ORAL | Status: DC
  Administered 2012-03-26 – 2012-03-28 (×3): 100 mg via ORAL
  Filled 2012-03-26 (×4): qty 1

## 2012-03-26 NOTE — Progress Notes (Signed)
4 Days Post-Op  Subjective: Comfortable Tolerating po  Objective: Vital signs in last 24 hours: Temp:  [98.4 F (36.9 C)-99.2 F (37.3 C)] 99 F (37.2 C) (07/09 0500) Pulse Rate:  [85-90] 90  (07/09 0500) Resp:  [17-18] 18  (07/09 0500) BP: (103-120)/(68-77) 120/77 mmHg (07/09 0500) SpO2:  [92 %-95 %] 92 % (07/09 0500) Last BM Date: 03/22/12  Intake/Output from previous day: 07/08 0701 - 07/09 0700 In: 800 [P.O.:800] Out: 900 [Urine:900] Intake/Output this shift:    Abdomen soft, non distended, incision clean  Lab Results:   Basename 03/25/12 0635 03/24/12 0500  WBC 7.6 7.3  HGB 8.6* 9.2*  HCT 27.4* 29.1*  PLT 246 243   BMET  Basename 03/25/12 1020 03/25/12 0635  NA 140 139  K 3.3* 3.1*  CL 103 103  CO2 26 26  GLUCOSE 163* 132*  BUN 9 9  CREATININE 0.79 0.76  CALCIUM 9.0 8.8   PT/INR No results found for this basename: LABPROT:2,INR:2 in the last 72 hours ABG No results found for this basename: PHART:2,PCO2:2,PO2:2,HCO3:2 in the last 72 hours  Studies/Results: No results found.  Anti-infectives: Anti-infectives     Start     Dose/Rate Route Frequency Ordered Stop   03/22/12 1800   cefOXitin (MEFOXIN) 1 g in dextrose 5 % 50 mL IVPB        1 g 100 mL/hr over 30 Minutes Intravenous Every 6 hours 03/22/12 1606 03/23/12 0645   03/22/12 0000   cefOXitin (MEFOXIN) 2 g in dextrose 5 % 50 mL IVPB        2 g 100 mL/hr over 30 Minutes Intravenous 60 min pre-op 03/21/12 1336 03/22/12 1145   03/21/12 1330   cefOXitin (MEFOXIN) 2 g in dextrose 5 % 50 mL IVPB  Status:  Discontinued        2 g 100 mL/hr over 30 Minutes Intravenous 60 min pre-op 03/21/12 1330 03/21/12 1336   03/21/12 0600   erythromycin (E-MYCIN) tablet 1,000 mg        1,000 mg Oral 4 times per day 03/20/12 0939 03/22/12 0015   03/21/12 0600   neomycin (MYCIFRADIN) tablet 1,000 mg        1,000 mg Oral 4 times per day 03/20/12 0939 03/22/12 0016   03/11/12 2000   ciprofloxacin (CIPRO) IVPB 400  mg  Status:  Discontinued        400 mg 200 mL/hr over 60 Minutes Intravenous Every 12 hours 03/11/12 1711 03/20/12 0939   03/11/12 2000   metroNIDAZOLE (FLAGYL) IVPB 500 mg  Status:  Discontinued        500 mg 100 mL/hr over 60 Minutes Intravenous Every 8 hours 03/11/12 1711 03/20/12 0939          Assessment/Plan: s/p Procedure(s) (LRB): PARTIAL COLECTOMY (Right)  Continue current care Will advance po slowly Ok to resume plavix from surgery standpoint  LOS: 15 days    Jame Seelig A 03/26/2012

## 2012-03-26 NOTE — Progress Notes (Signed)
Subjective:  Patient denies any chest pain or shortness of breath. Tolerating by mouth Objective:  Vital Signs in the last 24 hours: Temp:  [98.4 F (36.9 C)-99.2 F (37.3 C)] 99 F (37.2 C) (07/09 0500) Pulse Rate:  [85-90] 90  (07/09 0500) Resp:  [17-18] 18  (07/09 0500) BP: (103-120)/(68-77) 120/77 mmHg (07/09 0500) SpO2:  [92 %-95 %] 92 % (07/09 0500)  Intake/Output from previous day: 07/08 0701 - 07/09 0700 In: 800 [P.O.:800] Out: 900 [Urine:900] Intake/Output from this shift:    Physical Exam: Neck: no adenopathy, no carotid bruit, no JVD and supple, symmetrical, trachea midline Lungs: clear to auscultation bilaterally Heart: regular rate and rhythm, S1, S2 normal, no murmur, click, rub or gallop Abdomen: soft, non-tender; bowel sounds normal; no masses,  no organomegaly Extremities: extremities normal, atraumatic, no cyanosis or edema  Lab Results:  Basename 03/26/12 0856 03/25/12 0635 03/24/12 0500  WBC -- 7.6 7.3  HGB 9.0* 8.6* --  PLT -- 246 243    Basename 03/26/12 0856 03/25/12 1020 03/25/12 0635  NA -- 140 139  K 3.6 3.3* --  CL -- 103 103  CO2 -- 26 26  GLUCOSE -- 163* 132*  BUN -- 9 9  CREATININE -- 0.79 0.76   No results found for this basename: TROPONINI:2,CK,MB:2 in the last 72 hours Hepatic Function Panel No results found for this basename: PROT,ALBUMIN,AST,ALT,ALKPHOS,BILITOT,BILIDIR,IBILI in the last 72 hours No results found for this basename: CHOL in the last 72 hours No results found for this basename: PROTIME in the last 72 hours  Imaging: Imaging results have been reviewed and No results found.  Cardiac Studies:  Assessment/Plan:  Right colonic adenocarcinoma of colon status post right hemicolectomy postop day 3 doing well  Coronary artery disease history of MI in the past status post CABG  Mildly positive Lexiscan Myoview although patient had prolonged ST depression during the stress portion of the test in inferolateral leads.    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 Start Plavix per orders  LOS: 15 days    [name] N 03/26/2012, 11:43 AM

## 2012-03-26 NOTE — Progress Notes (Signed)
Physical Therapy Treatment Patient Details Name: [name] MRN: [id number] DOB: 12-28-1940 Today's Date: 03/26/2012 Time: 4098-1191 PT Time Calculation (min): 20 min  PT Assessment / Plan / Recommendation Comments on Treatment Session  Patient with improved function today, but decreased safety and impulsivity puts patient at increased falls risk.     Follow Up Recommendations  Skilled nursing facility    Barriers to Discharge  None      Equipment Recommendations  Defer to next venue    Recommendations for Other Services  None  Frequency Min 3X/week   Plan Discharge plan remains appropriate;Frequency remains appropriate    Precautions / Restrictions Precautions Precautions: Fall   Pertinent Vitals/Pain VSS/ Mild abdominal pain indicated only    Mobility  Bed Mobility Bed Mobility: Rolling Left;Left Sidelying to Sit;Sitting - Scoot to [place] of Bed Rolling Left: 6: Modified independent (Device/Increase time) Left Sidelying to Sit: 4: Min assist Sitting - Scoot to Edge of Bed: 4: Min guard Details for Bed Mobility Assistance: Patient required assistance to move right leg off edge of bed when in side lying position. Poor awareness that leg still on bed while left leg was on floor.  Transfers Sit to Stand: 4: Min assist;With upper extremity assist;From bed Stand to Sit: 4: Min guard;With upper extremity assist;To chair/3-in-1 Details for Transfer Assistance: Improved initiation of stand today. Required assistance to place right arm on walker otherwise leaves at side and attempts to walk.  Ambulation/Gait Ambulation/Gait Assistance: 4: Min assist Ambulation Distance (Feet): 120 Feet Assistive device: Rolling walker Ambulation/Gait Assistance Details: Patient intermittently steers walker to the right and requires assistance of PT to correct. Patient with slower pace and stride length today leading to less pertubation to balance and maintained upright posture without  facilitation Gait Pattern: Step-through pattern;Decreased stride length;Trunk flexed     PT Goals Acute Rehab PT Goals PT Goal: Supine/Side to Sit - Progress: Progressing toward goal PT Goal: Sit to Stand - Progress: Progressing toward goal PT Goal: Ambulate - Progress: Progressing toward goal  Visit Information  Last PT Received On: 03/26/12 Assistance Needed: +1    Subjective Data  Subjective: Patient reports "let's go" in response to walking  Patient Stated Goal: Home with his wife   Cognition  Overall Cognitive Status: Difficult to assess Area of Impairment: Safety/judgement;Problem solving Difficult to assess due to: Impaired communication Arousal/Alertness: Awake/alert Orientation Level: Appears intact for tasks assessed Behavior During Session: [facility] for tasks performed Safety/Judgement - Other Comments: Impulsive - began to stand before prepared Cognition - Other Comments: Tearful on one occasion    Balance  High Level Balance High Level Balance Activites: Turns High Level Balance Comments: Good stability with walker with turns today.   End of Session PT - End of Session Equipment Utilized During Treatment: Gait belt Activity Tolerance: Patient tolerated treatment well Patient left: in chair;with call bell/phone within reach;with chair alarm set Nurse Communication: Mobility status    Edwyna Perfect, PT  Pager [phone]  03/26/2012, 11:43 AM

## 2012-03-26 NOTE — Progress Notes (Signed)
Occupational Therapy Treatment Patient Details Name: [name] MRN: [id number] DOB: 09/25/1940 Today's Date: 03/26/2012 Time: 7829-5621 OT Time Calculation (min): 13 min  OT Assessment / Plan / Recommendation Comments on Treatment Session Pt progressing this session to static standing at sink level. pt lacks awareness of Rt side of face and demonstrates sensation deficits. Pt with tactile input from OT to attempt to wipe face and unable to wipe off pudding.     Follow Up Recommendations  Skilled nursing facility    Barriers to Discharge       Equipment Recommendations  Defer to next venue    Recommendations for Other Services    Frequency Min 2X/week   Plan Discharge plan remains appropriate    Precautions / Restrictions Precautions Precautions: Fall   Pertinent Vitals/Pain 148/72 HR 89    ADL  Grooming: Performed;Teeth care;Minimal assistance;Wash/dry face (Mod (A) for face washing Rt side sensation deficits) Where Assessed - Grooming: Supported standing (See adl comment section) Toilet Transfer: Performed;Min guard Toilet Transfer Method: Sit to stand Equipment Used: Gait belt;Rolling walker Transfers/Ambulation Related to ADLs: Pt ambulated ~15 ft with v/c for hand placement of Rt UE. Pt with Rt UE off RW and unaware. ADL Comments: Pt agreeable to OT session and RN Christy entering room to inform OT that pt with x2 near falls with RN staff this PM. OT ambulated pt to sink level for grooming. Pt required (A) to open tooth brush from plastic. Pt attempting to open tooth paste tube with Lt hand only. Pt stabilized tooth brush in Rt hand and applied with Lt hand. Pt brushing with Lt hand . Pt required faciliation to increase hip flexion for spitting into sink. Pt with x1 buckle Rt Le at sink level. pt requesting to sit. Pt holding chest and reports no pain with asked. vitals obtained 148/72 and HR 89. Pt ambulated back to bed level and returned to supine positioning.       OT Goals Acute Rehab OT Goals OT Goal Formulation: Patient unable to participate in goal setting Time For Goal Achievement: 04/03/12 Potential to Achieve Goals: Good ADL Goals Pt Will Perform Grooming: with supervision;Standing at sink ADL Goal: Grooming - Progress: Progressing toward goals Pt Will Perform Upper Body Bathing: with set-up;Sitting, edge of bed;Sitting, chair Pt Will Perform Lower Body Bathing: with supervision;Sit to stand from chair;Sit to stand from bed Pt Will Perform Upper Body Dressing: with set-up;Sitting, chair;Sitting, bed;Unsupported Pt Will Perform Lower Body Dressing: with supervision;Sit to stand from bed;Sit to stand from chair Pt Will Transfer to Toilet: with supervision;with DME;Ambulation;3-in-1 ADL Goal: Toilet Transfer - Progress: Progressing toward goals Pt Will Perform Toileting - Clothing Manipulation: with supervision;Standing  Visit Information  Last OT Received On: 03/26/12 Assistance Needed: +1               Cognition  Overall Cognitive Status: Difficult to assess Area of Impairment: Safety/judgement;Problem solving Difficult to assess due to: Impaired communication Arousal/Alertness: Awake/alert Orientation Level: Appears intact for tasks assessed Behavior During Session: [facility] for tasks performed Safety/Judgement: Decreased awareness of safety precautions;Decreased safety judgement for tasks assessed Safety/Judgement - Other Comments: Impulsive - began to stand before prepared Cognition - Other Comments: Tearful on one occasion    Mobility Bed Mobility Bed Mobility: Supine to Sit;Sit to Supine Rolling Left: 6: Modified independent (Device/Increase time) Left Sidelying to Sit: 4: Min assist Supine to Sit: 5: Supervision;With rails;HOB elevated Sitting - Scoot to Edge of Bed: 4: Min guard Sit to Supine: 5:  Supervision;With rail;HOB elevated Details for Bed Mobility Assistance: Pt required extended time to complete  task. Transfers Transfers: Sit to Stand;Stand to Sit Sit to Stand: 4: Min guard;With upper extremity assist;From bed Stand to Sit: 4: Min guard;To bed;With upper extremity assist Details for Transfer Assistance: Pt required mod v/c for hand placement on RW.        Balance Buckling at sink static standing   End of Session OT - End of Session Activity Tolerance: Patient tolerated treatment well Patient left: in bed;with call bell/phone within reach;Other (comment) (alarm set) Nurse Communication: Mobility status (RECOMMEND STEDY)       Harrel Carina [facility] 03/26/2012, 2:11 PM Pager: [phone]

## 2012-03-26 NOTE — Progress Notes (Signed)
[facility]                                                                                Patient Demographics  [name], is a 71 y.o. male  ZOX:[id number]  [id number]  DOB - [birth year]  Admit date - 03/11/2012  Admitting Physician Laveda Norman, MD  Outpatient Primary MD for the patient is Delorse Lek, MD  LOS - 15   No chief complaint on file.       Subjective:   [name] today has, No headache, No chest pain,  No Nausea, No new weakness tingling or numbness, No Cough - SOB.  Is in chair. His postop abdominal pain is much improved.  Objective:   Filed Vitals:   03/25/12 0500 03/25/12 1330 03/25/12 2100 03/26/12 0500  BP: 169/70 103/68 114/69 120/77  Pulse: 79 85 90 90  Temp: 98.5 F (36.9 C) 98.4 F (36.9 C) 99.2 F (37.3 C) 99 F (37.2 C)  TempSrc:      Resp: 18 17 18 18   Height:      Weight:      SpO2: 95% 95% 95% 92%    Wt Readings from Last 3 Encounters:  03/19/12 67 kg (147 lb 11.3 oz)  03/19/12 67 kg (147 lb 11.3 oz)  03/19/12 67 kg (147 lb 11.3 oz)     Intake/Output Summary (Last 24 hours) at 03/26/12 0834 Last data filed at 03/25/12 1200  Gross per 24 hour  Intake    800 ml  Output    900 ml  Net   -100 ml    Exam Awake Alert,  No new F.N deficits, Normal affect [place].AT,PERRAL Supple Neck,No JVD, No cervical lymphadenopathy appriciated.  Symmetrical Chest wall movement, Good air movement bilaterally, CTAB RRR,No Gallops,Rubs or new Murmurs, No Parasternal Heave No B.Sounds, Abd Soft, mild generalized tenderness, No organomegaly appriciated, postop site under bandage looks stable. No Cyanosis, Clubbing or edema, No new Rash or bruise     Data Review  CBC  Lab 03/25/12 0635 03/24/12 0500 03/23/12 1306 03/23/12 0525 03/22/12 0545 03/20/12 0500  WBC 7.6 7.3 -- 10.3 5.9 5.5  HGB 8.6* 9.2* 10.0* 9.9* 9.4* --  HCT 27.4* 29.1* 31.3* 31.0* 28.8* --  PLT 246 243 -- 247 235 230  MCV 78.1 78.6 -- 77.1* 79.1  80.2  MCH 24.5* 24.9* -- 24.6* 25.8* 25.7*  MCHC 31.4 31.6 -- 31.9 32.6 32.1  RDW 14.9 14.9 -- 14.4 15.0 15.5  LYMPHSABS -- -- -- -- -- --  MONOABS -- -- -- -- -- --  EOSABS -- -- -- -- -- --  BASOSABS -- -- -- -- -- --  BANDABS -- -- -- -- -- --    Chemistries   Lab 03/25/12 1020 03/25/12 0635 03/24/12 0500 03/23/12 0525 03/22/12 0545 03/21/12 0555  NA 140 139 141 134* 139 --  K 3.3* 3.1* 3.6 3.8 3.8 --  CL 103 103 108 101 107 --  CO2 26 26 24 22 22  --  GLUCOSE 163* 132* 145* 164* 141* --  BUN 9 9 7 7 9  --  CREATININE 0.79 0.76 0.77 0.73 0.85 --  CALCIUM 9.0 8.8 8.7  8.6 8.5 --  MG 1.8 1.6 1.8 -- -- 1.8  AST -- -- -- -- -- --  ALT -- -- -- -- -- --  ALKPHOS -- -- -- -- -- --  BILITOT -- -- -- -- -- --   ------------------------------------------------------------------------------------------------------------------ estimated creatinine clearance is 74.7 ml/min (by C-G formula based on Cr of 0.79). ------------------------------------------------------------------------------------------------------------------ No results found for this basename: HGBA1C:2 in the last 72 hours ------------------------------------------------------------------------------------------------------------------ No results found for this basename: CHOL:2,HDL:2,LDLCALC:2,TRIG:2,CHOLHDL:2,LDLDIRECT:2 in the last 72 hours ------------------------------------------------------------------------------------------------------------------ No results found for this basename: TSH,T4TOTAL,FREET3,T3FREE,THYROIDAB in the last 72 hours ------------------------------------------------------------------------------------------------------------------  [facility] 03/25/12 0635  VITAMINB12 --  FOLATE --  FERRITIN --  TIBC 249  IRON 56  RETICCTPCT --    Coagulation profile No results found for this basename: INR:5,PROTIME:5 in the last 168 hours  No results found for this basename: DDIMER:2 in the last 72  hours  Cardiac Enzymes No results found for this basename: CK:3,CKMB:3,TROPONINI:3,MYOGLOBIN:3 in the last 168 hours ------------------------------------------------------------------------------------------------------------------ No components found with this basename: POCBNP:3  Micro Results No results found for this or any previous visit (from the past 240 hour(s)).  Radiology Reports Ct Abdomen Pelvis W Contrast  03/11/2012  *RADIOLOGY REPORT*  Clinical Data: 71 year old male with left lower quadrant abdominal pain.  History of kidney stones.  CT ABDOMEN AND PELVIS WITH CONTRAST  Technique:  Multidetector CT imaging of the abdomen and pelvis was performed following the standard protocol during bolus administration of intravenous contrast.  Contrast: 80mL OMNIPAQUE IOHEXOL 300 MG/ML  SOLN  Comparison: Abdominal radiographs 03/07/2012.  Findings: Dependent opacity and crowding of markings at the lung bases compatible with atelectasis.  Mild cardiomegaly.  No pericardial or pleural effusion.  Sequelae median sternotomy. Degenerative changes in the spine. No acute osseous abnormality identified.  No pelvic free fluid.  The bladder is distended and yet there is evidence of bladder wall thickening.  No perivesical stranding. The prostate is enlarged and lobulated and there is mild contour deformity at the base of the bladder.  Negative distal colon aside from retained stool.  Oral contrast has reached the splenic flexure.  No wall thickening or inflammation of the left colon.  Contrast mixed with stool throughout the more proximal colon.  There is a the crescent-shaped fungating soft tissue mass arising in the posterior wall of the right colon on series 5 image 30 measuring 22 x 46 x 23 mm (AP by transverse by CC, coronal image 45, sagittal image 20).  A conspicuous but small lymph node in the adjacent mesentery measures 5 mm in short axis (series 2 image 52). No mesenteric lymphadenopathy.  This lesion  is nonobstructing.  The lesion is only 1 to 2 cm distal to the ileocecal valve which is best demonstrated on coronal image 33. Distal small bowel loops are nondilated.  There may be mild distal small bowel wall thickening not involving the terminal ileum in the right lower quadrant (series 2 image 66) but this may be artifact of peristalsis.  No dilated small bowel.  Negative stomach, duodenum, spleen, pancreas, and adrenal glands.  No liver mass identified.  Dependent cholelithiasis.  No pericholecystic inflammation. Multiple low-density areas in both kidneys probably are benign cyst.  There is a 7-8 mm calculus located in the right renal pelvis but not causing obstruction at this time.  No hydronephrosis or hydroureter.  Mild perinephric stranding may be age related.  No abdominal free fluid or lymphadenopathy.  Atherosclerosis of the abdominal aorta and iliac arteries.  IMPRESSION: 1.  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 22 x 46 x 23 mm soft tissue mass.  Small but conspicuous mesenteric lymph node nearby measuring 5 mm short axis.  No liver or distant metastatic disease identified in the abdomen pelvis. 2.  Kidney stone at the right renal pelvis but without associated obstruction at this time. 3.  The bladder is distended with evidence of wall thickening.  The prostate is enlarged.  Chronic bladder outlet obstruction suspected and urology follow up to include prostate evaluation recommended. 4.  Cholelithiasis.  Aortic atherosclerosis.  Salient findings discussed by telephone with provider Maren Reamer on 03/11/2012 at  Original Report Authenticated By: Harley Hallmark, M.D.   Nm Myocar Multi W/spect W/wall Motion / Ef  03/17/2012  *RADIOLOGY REPORT*  Clinical Data:  Chest pain  Technique:  Standard myocardial SPECT imaging performed after resting intravenous injection of 10 mCi Tc-31m tetrofosmin . Subsequently, intravenous  infusion of Lexiscan performed under the supervision of the Cardiology staff.  At peak effect of the drug, 30 mCi Tc-69mtetrofosmin injected intravenously and standard myocardial SPECT imaging performed.  Quantitative gated imaging also performed to evaluate left ventricular wall motion and estimate left ventricular ejection fraction.  Comparison:  None  MYOCARDIAL IMAGING WITH SPECT (REST AND PHARMACOLOGIC-STRESS)  Findings:  There is a small area of mild reversibility involving the apical segment of the anterior wall.  The summed difference score is equal to one.  This likely reflects nonuniform soft tissue attenuation artifact.  GATED LEFT VENTRICULAR WALL MOTION STUDY  Findings:  Review of the gated images demonstrates moderate hypokinesis involving the mid and apical segments of the anterior septum is noted.  There is mild hypokinesis involving the basilar segments of the lateral wall.  Normal wall thickening.  LEFT VENTRICULAR EJECTION FRACTION  Findings:  QGS ejection fraction measures 61% , with an end- diastolic volume of 84 ml and an end-systolic volume of 33 ml.  IMPRESSION:  1. Small area of mild reversibility involving the apical segment of the anterior wall.  This has a summed difference score equal to one and likely reflects nonuniform soft tissue attenuation artifact. True myocardial ischemia is considered less favored.  2.  Anterior septal hypokinesis. 3.  Left ventricular ejection fraction equals 61%.  Original Report Authenticated By: Rosealee Albee, M.D.    Scheduled Meds:    . alvimopan  12 mg Oral BID  . aspirin  81 mg Oral Daily  . enoxaparin (LOVENOX) injection  40 mg Subcutaneous Q24H  . ezetimibe-simvastatin  1 tablet Oral QHS  . feeding supplement  1 Container Oral BID BM  . insulin aspart  0-9 Units Subcutaneous TID WC  . iron dextran (INFED/DEXFERRUM) infusion  25 mg Intravenous Once   Followed by  . iron dextran (INFED/DEXFERRUM) infusion  500 mg Intravenous Daily  .  LORazepam  2 mg Oral QHS  . potassium chloride  10 mEq Intravenous Q1 Hr x 4  . Tamsulosin HCl  0.4 mg Oral QPC breakfast   Continuous Infusions:    . sodium chloride    . DISCONTD: sodium chloride 50 mL/hr at 03/24/12 1029  . DISCONTD: sodium chloride Stopped (03/25/12 0915)   PRN Meds:.sodium chloride, acetaminophen, acetaminophen, albuterol, ipratropium, metoprolol, morphine injection, nitroGLYCERIN, ondansetron (ZOFRAN) IV, ondansetron, oxyCODONE  Assessment & Plan   This is a 71 year old pleasant Caucasian gentleman who was admitted over 2 weeks ago with the per rectum, during his workup he  was found to have a cecal mass, he underwent colonoscopy followed by hemicolectomy done a few days ago, his biopsies were consistent with adenocarcinoma of the colon. Patient also during his preop evaluation was found to have severe coronary artery disease along with left subclavian stenosis, he underwent left heart catheterization by Dr. Sharyn Lull and is due for stent placement at a later date once he is more stable.  Patient is currently postop day 3 is doing much better, is on clear liquid diet, his current issues are postop abdominal pain, monitoring of anemia, he is to be seen by oncology who have been consulted. Once he is stable he will get stent placed by cardiology and will need to see vascular surgery for his subclavian stenosis.    Assumed care of the patient on 03/20/2012 on day 9 of his hospital stay.    1. Abdominal pain/Colonic mass ([place] CA), hemicolectomy done 03/22/2012 :  Abdominal/pelvic CT scan showed findings most consistent with adenocarcinoma of the cecum/ascending colon, 22 x 46 x 23 mm soft tissue mass. Small but conspicuous mesenteric lymph node nearby measuring 5 mm short axis. No liver or distant metastatic disease identified in the abdomen pelvis. Dr Lina Sar saw pt, and colonoscopy was done 6/26 - mass in ascending colon. and CEA Of 03/12/12 was 3.2. Status post right  hemicolectomy by Dr. Jimmye Norman on 03/22/2012. Taking clear liquids. Much improved pain control,  surgery following the patient. Incentive spirometry every hour while awake.  Oncology has been consulted they will see the patient soon, have discussed the case with Dr. Chrissie Noa who has requested Dr. Elnita Maxwell to see the patient in hospital. we'll request surgery to clarify if patient can be started on Plavix. I'm awaiting his repeat H&H today.     2. Anemia, Fe def: Iron deficiency anemia worsened by blood loss do to adenocarcinoma of the colon - hemoglobin dropped to 7.1 and he had been transfused earlier in his hospital stay. hgb improved s/p transfusion, stable. Goal Hb>8 due to CAD burden. His anemia panel was consistent with severe iron deficiency, IV iron was given to the patient on 03/25/2012. Await H&H today which is pending.     3. CAD (coronary artery disease)/+stress test: Patient has a known history of CAD, s/p MI,s/p CABG. stress test positive, left heart catheterization done on 03-2012 suggestive of multivessel stenosis, patient is symptom free on aspirin, we'll request surgery to clarify if patient can be started on Plavix. Stenting at a later date per cardiology.     4. Blood pressure: BP runs soft at baseline, baseline improved with IV fluids, will hold IV fluids and monitor blood pressures and intake and output.     5. COPD (chronic obstructive pulmonary disease): Stable/aymptomatic. PRN Nebs-o2.     6. H/O: CVA (cerebrovascular accident): Stable. On low-dose aspirin.     7. Diabetes mellitus: -continue ssi changed to q. a.c.   CBG (last 3)   Basename 03/26/12 0725 03/25/12 2014 03/25/12 1643  GLUCAP 153* 141* 177*      8. Urinary retention which happened earlier during his hospital stay  Patient was placed on Flomax, attempted to remove his Foley on 03/25/2012, however he developed urinary retention again, Foley was replaced Flomax to be continued, will  reattempt to take the Foley out once narcotic use is minimizing activities increased.    9. L subclavian stenosis - found incidentally during left heart catheterization.  Will call vascular surgery once patient's acute issues have resolved.  10. Hypokalemia- will replace potassium IV and await his a.m. potassium and magnesium levels which are pending.    DVT Prophylaxis   SCDs - lovenox   Procedures CT scan abdomen pelvis, Cardiolite stress test, left heart catheterization, Rt Hemicolectomy  Consults Gen. surgery, cardiology, PT   Leroy Sea M.D on 03/26/2012 at 8:34 AM  Between 7am to 7pm - Pager - [phone]  After 7pm go to [email or web address] - password [email or web address]  And look for the night coverage person covering for me after hours  [facility] Office  [phone]

## 2012-03-26 NOTE — Progress Notes (Signed)
I have reviewed the note and information by Lauren Bajbus, Pharm.D.  I agree with her assessment and plan.    Briefly, Mr. Bodi is on day #2/3 of IV Iron Dextran for anemia.  No complications noted.  Recommended oral iron supplement to continue once IV Fe completed.  [employer], Pharm.D., BCPS Clinical Pharmacist Pager [phone] 03/26/2012 10:30 AM

## 2012-03-26 NOTE — Progress Notes (Signed)
Pt unable to void, bladder scan > 600. Dr Thedore Mins order to replace foley.

## 2012-03-26 NOTE — Progress Notes (Signed)
OT NOTE   Recommend that RN staff use "STEDY" with pt for transfers due to RN staff reporting buckling x 2 s/p PT session. Pt with x1 buckle during OT session.    [name]   OTR/L Pager: [phone] Office: [phone] .

## 2012-03-26 NOTE — Progress Notes (Signed)
MEDICATION RELATED CONSULT NOTE - FOLLOW UP   Pharmacy Consult for IV iron dextran Indication: iron deficiency anemia  No Known Allergies  Patient Measurements: Height: 5\' 5"  (165.1 cm) Weight: 147 lb 11.3 oz (67 kg) IBW/kg (Calculated) : 61.5   Vital Signs: Temp: 99 F (37.2 C) (07/09 0500) BP: 120/77 mmHg (07/09 0500) Pulse Rate: 90  (07/09 0500) Intake/Output from previous day: 07/08 0701 - 07/09 0700 In: 800 [P.O.:800] Out: 900 [Urine:900]  Labs:  Basename 03/25/12 1020 03/25/12 0635 03/24/12 0500 03/23/12 1306  WBC -- 7.6 7.3 --  HGB -- 8.6* 9.2* 10.0*  HCT -- 27.4* 29.1* 31.3*  PLT -- 246 243 --  APTT -- -- -- --  CREATININE 0.79 0.76 0.77 --  LABCREA -- -- -- --  CREATININE 0.79 0.76 0.77 --  CREAT24HRUR -- -- -- --  MG 1.8 1.6 1.8 --  PHOS -- -- -- --  ALBUMIN -- -- -- --  PROT -- -- -- --  ALBUMIN -- -- -- --  AST -- -- -- --  ALT -- -- -- --  ALKPHOS -- -- -- --  BILITOT -- -- -- --  BILIDIR -- -- -- --  IBILI -- -- -- --   Estimated Creatinine Clearance: 74.7 ml/min (by C-G formula based on Cr of 0.79).   Anemia Profile (03/25/12) Iron: 56 UIBC: 193 TIBC: 249 Sat ratio: 22  Past Medical History  Diagnosis Date  . Myocardial infarction   . CVA (cerebral infarction)   . Diabetes mellitus   . Hypertension   . Diabetes mellitus   . Aphasia   . Dysphagia   . Stroke   . COPD (chronic obstructive pulmonary disease)   . Shortness of breath   . Anemia     Medications:  Scheduled:    . alvimopan  12 mg Oral BID  . aspirin  81 mg Oral Daily  . enoxaparin (LOVENOX) injection  40 mg Subcutaneous Q24H  . ezetimibe-simvastatin  1 tablet Oral QHS  . feeding supplement  1 Container Oral BID BM  . insulin aspart  0-9 Units Subcutaneous TID WC  . iron dextran (INFED/DEXFERRUM) infusion  25 mg Intravenous Once   Followed by  . iron dextran (INFED/DEXFERRUM) infusion  500 mg Intravenous Daily  . LORazepam  2 mg Oral QHS  . potassium chloride  10  mEq Intravenous Q1 Hr x 4  . Tamsulosin HCl  0.4 mg Oral QPC breakfast    Assessment: Pt. Is s/p colonoscopy and right hemicoloectomy. Currently being worked up for possible colon CA. Pt. Has received test dose of 25mg  of IV iron dextran as well as one dose of 500mg  of IV iron dextran. Iron panel is trending up.  Goal of Therapy:  Iron > 42; ferritin > 22; Sat % > 20% Hgb > 8  Plan:  Will receive second dose of IV iron dextran 500mg  today. Last dose of 500mg  to be given tomorrow morning (7/10). Suggest oral iron replacement upon discharge.  [name] 03/26/2012,8:32 AM

## 2012-03-26 NOTE — Progress Notes (Signed)
CSW updated clinicals to [facility] which is patient family first choice. [facility] is still considering. Pt can discharge to [facility] or [facility] per family if [facility] does not have anything available when pt is medically stable.   .Clinical social worker continuing to follow pt to assist with pt dc plans and further csw needs.   Catha Gosselin, Theresia Majors  [phone] .03/26/2012 1026am

## 2012-03-27 DIAGNOSIS — I2510 Atherosclerotic heart disease of native coronary artery without angina pectoris: Secondary | ICD-10-CM

## 2012-03-27 DIAGNOSIS — J449 Chronic obstructive pulmonary disease, unspecified: Secondary | ICD-10-CM

## 2012-03-27 LAB — BASIC METABOLIC PANEL
Calcium: 9.3 mg/dL (ref 8.4–10.5)
Chloride: 103 mEq/L (ref 96–112)
Creatinine, Ser: 0.83 mg/dL (ref 0.50–1.35)
GFR calc Af Amer: >90 mL/min (ref >90)
Sodium: 140 mEq/L (ref 135–145)

## 2012-03-27 LAB — GLUCOSE, CAPILLARY

## 2012-03-27 LAB — MAGNESIUM: Magnesium: 1.7 mg/dL (ref 1.5–2.5)

## 2012-03-27 MED ORDER — POLYSACCHARIDE IRON COMPLEX 150 MG PO CAPS
150.0000 mg | ORAL_CAPSULE | Freq: Two times a day (BID) | ORAL | Status: DC
  Administered 2012-03-27 – 2012-03-28 (×3): 150 mg via ORAL
  Filled 2012-03-27 (×4): qty 1

## 2012-03-27 MED ORDER — PRO-STAT SUGAR FREE PO LIQD
30.0000 mL | Freq: Two times a day (BID) | ORAL | Status: DC
  Administered 2012-03-27 – 2012-03-28 (×2): 30 mL via ORAL
  Filled 2012-03-27 (×4): qty 30

## 2012-03-27 NOTE — Progress Notes (Signed)
Will discontinue alvimopan per [employer] policy as pt reports BM on 7/10.  [employer], Pharm.D., BCPS Clinical Pharmacist Pager [phone] 03/27/2012 10:43 AM

## 2012-03-27 NOTE — Progress Notes (Signed)
Patient ID: [name], male   DOB: [birth year], 72 y.o.   MRN: [id number] 5 Days Post-Op  Subjective:  Appears comfortable, no reports of any abdominal pain, + flatus, "small BM per patient. No N/V.  Objective: Vital signs in last 24 hours: Temp:  [98.2 F (36.8 C)-99.5 F (37.5 C)] 98.6 F (37 C) (07/10 0500) Pulse Rate:  [65-87] 65  (07/10 0500) Resp:  [18] 18  (07/10 0500) BP: (98-148)/(62-72) 107/67 mmHg (07/10 0500) SpO2:  [94 %] 94 % (07/10 0500) Last BM Date: 03/22/12  Intake/Output from previous day: 07/09 0701 - 07/10 0700 In: -  Out: 950 [Urine:950] Intake/Output this shift:    Abdomen soft, non distended, incision clean, staples intact without erythema.  Lab Results:   Basename 03/26/12 0856 03/25/12 0635  WBC -- 7.6  HGB 9.0* 8.6*  HCT 28.8* 27.4*  PLT -- 246   BMET  Basename 03/27/12 0635 03/26/12 0856 03/25/12 1020  NA 140 -- 140  K 3.5 3.6 --  CL 103 -- 103  CO2 26 -- 26  GLUCOSE 137* -- 163*  BUN 12 -- 9  CREATININE 0.83 -- 0.79  CALCIUM 9.3 -- 9.0   PT/INR No results found for this basename: LABPROT:2,INR:2 in the last 72 hours ABG No results found for this basename: PHART:2,PCO2:2,PO2:2,HCO3:2 in the last 72 hours  Studies/Results: No results found.  Anti-infectives: Anti-infectives     Start     Dose/Rate Route Frequency Ordered Stop   03/22/12 1800   cefOXitin (MEFOXIN) 1 g in dextrose 5 % 50 mL IVPB        1 g 100 mL/hr over 30 Minutes Intravenous Every 6 hours 03/22/12 1606 03/23/12 0645   03/22/12 0000   cefOXitin (MEFOXIN) 2 g in dextrose 5 % 50 mL IVPB        2 g 100 mL/hr over 30 Minutes Intravenous 60 min pre-op 03/21/12 1336 03/22/12 1145   03/21/12 1330   cefOXitin (MEFOXIN) 2 g in dextrose 5 % 50 mL IVPB  Status:  Discontinued        2 g 100 mL/hr over 30 Minutes Intravenous 60 min pre-op 03/21/12 1330 03/21/12 1336   03/21/12 0600   erythromycin (E-MYCIN) tablet 1,000 mg        1,000 mg Oral 4 times per day  03/20/12 0939 03/22/12 0015   03/21/12 0600   neomycin (MYCIFRADIN) tablet 1,000 mg        1,000 mg Oral 4 times per day 03/20/12 0939 03/22/12 0016   03/11/12 2000   ciprofloxacin (CIPRO) IVPB 400 mg  Status:  Discontinued        400 mg 200 mL/hr over 60 Minutes Intravenous Every 12 hours 03/11/12 1711 03/20/12 0939   03/11/12 2000   metroNIDAZOLE (FLAGYL) IVPB 500 mg  Status:  Discontinued        500 mg 100 mL/hr over 60 Minutes Intravenous Every 8 hours 03/11/12 1711 03/20/12 0939          Assessment/Plan: s/p Procedure(s) (LRB): PARTIAL COLECTOMY (Right)  Continue current care Continue to advance po intake slowly. (currently on Full liquid).   LOS: 16 days    Deckard Stuber 03/27/2012

## 2012-03-27 NOTE — Progress Notes (Signed)
Physical Therapy Treatment Patient Details Name: [name] MRN: [id number] DOB: 1940-10-22 Today's Date: 03/27/2012 Time: 1191-4782 PT Time Calculation (min): 14 min  PT Assessment / Plan / Recommendation Comments on Treatment Session  Patient continues to improve his functional mobility and has excellent potential for increased independence with participation in rehab.     Follow Up Recommendations  Skilled nursing facility    Barriers to Discharge  None      Equipment Recommendations  Defer to next venue    Recommendations for Other Services  None  Frequency Min 3X/week   Plan Discharge plan remains appropriate;Frequency remains appropriate    Precautions / Restrictions Precautions Precautions: Fall   Pertinent Vitals/Pain VSS/ Mild abdominal pain only    Mobility  Bed Mobility Rolling Left: 6: Modified independent (Device/Increase time) Left Sidelying to Sit: 5: Supervision Sitting - Scoot to Edge of Bed: 6: Modified independent (Device/Increase time) Details for Bed Mobility Assistance: Patient with improved initiation of side to sit and placed right leg over edge indep. Verbal cues for sequencing only.  Transfers Sit to Stand: 4: Min guard;With upper extremity assist;From bed Stand to Sit: 5: Supervision;With upper extremity assist;With armrests;To chair/3-in-1 Details for Transfer Assistance: Increasing strength with sit<>stand. Verbal cues to not place left arm on walker for initiation of stand.  Ambulation/Gait Ambulation/Gait Assistance: 4: Min guard Ambulation Distance (Feet): 120 Feet Assistive device: Rolling walker Ambulation/Gait Assistance Details: Patient self limiting distance. With speed controlled patient without loss of balance to the right. He still has a tendency to steer walker toward the right and needs verbal cues to correct. Also improved upright posture.  Gait Pattern: Step-through pattern;Decreased stride length     PT Goals Acute  Rehab PT Goals PT Goal: Supine/Side to Sit - Progress: Met PT Goal: Sit to Stand - Progress: Progressing toward goal PT Goal: Ambulate - Progress: Progressing toward goal  Visit Information  Last PT Received On: 03/27/12 Assistance Needed: +1    Subjective Data  Subjective: Patient with little motivation for OOB - max. encouragement and then patient stated "I suppose so" Patient Stated Goal: Home with wife as soon as he can   Cognition  Arousal/Alertness: Awake/alert Orientation Level: Appears intact for tasks assessed Behavior During Session: [facility] for tasks performed Safety/Judgement: Decreased awareness of safety precautions;Decreased safety judgement for tasks assessed Safety/Judgement - Other Comments: Impulsiveness decreased today.    Balance  High Level Balance High Level Balance Comments: Close guarding required, but with slower speeds patient able to maintain his balance without external correction.   End of Session PT - End of Session Equipment Utilized During Treatment: Gait belt Activity Tolerance: Patient tolerated treatment well Patient left: in chair;with call bell/phone within reach;with chair alarm set Nurse Communication: Mobility status    Edwyna Perfect, PT  Pager [phone]  03/27/2012, 10:50 AM

## 2012-03-27 NOTE — Progress Notes (Signed)
Subjective: No specific complaints.  Tolerated full liquid diet.  Objective: Vital signs in last 24 hours: Filed Vitals:   03/26/12 1300 03/26/12 1400 03/26/12 2100 03/27/12 0500  BP: 148/72 121/65 98/62 107/67  Pulse: 87 80 74 65  Temp:  98.2 F (36.8 C) 99.5 F (37.5 C) 98.6 F (37 C)  TempSrc:      Resp:  18 18 18   Height:      Weight:      SpO2:  94% 94% 94%   Weight change:   Intake/Output Summary (Last 24 hours) at 03/27/12 1312 Last data filed at 03/26/12 1846  Gross per 24 hour  Intake      0 ml  Output    950 ml  Net   -950 ml    Physical Exam: General: Awake, Oriented, No acute distress. HEENT: EOMI. Neck: Supple CV: S1 and S2 Lungs: Clear to ascultation bilaterally Abdomen: Soft, Nontender, Nondistended, +bowel sounds. Ext: Good pulses. Trace edema.  Lab Results: Basic Metabolic Panel:  Lab 03/27/12 8295 03/26/12 0856 03/25/12 1020 03/25/12 0635 03/24/12 0500 03/23/12 0525  NA 140 -- 140 139 141 134*  K 3.5 3.6 3.3* 3.1* 3.6 --  CL 103 -- 103 103 108 101  CO2 26 -- 26 26 24 22   GLUCOSE 137* -- 163* 132* 145* 164*  BUN 12 -- 9 9 7 7   CREATININE 0.83 -- 0.79 0.76 0.77 0.73  CALCIUM 9.3 -- 9.0 8.8 8.7 8.6  MG 1.7 1.7 1.8 1.6 1.8 --  PHOS -- -- -- -- -- --   Liver Function Tests: No results found for this basename: AST:5,ALT:5,ALKPHOS:5,BILITOT:5,PROT:5,ALBUMIN:5 in the last 168 hours No results found for this basename: LIPASE:5,AMYLASE:5 in the last 168 hours No results found for this basename: AMMONIA:5 in the last 168 hours CBC:  Lab 03/26/12 0856 03/25/12 0635 03/24/12 0500 03/23/12 1306 03/23/12 0525 03/22/12 0545  WBC -- 7.6 7.3 -- 10.3 5.9  NEUTROABS -- -- -- -- -- --  HGB 9.0* 8.6* 9.2* 10.0* 9.9* --  HCT 28.8* 27.4* 29.1* 31.3* 31.0* --  MCV -- 78.1 78.6 -- 77.1* 79.1  PLT -- 246 243 -- 247 235   Cardiac Enzymes: No results found for this basename: CKTOTAL:5,CKMB:5,CKMBINDEX:5,TROPONINI:5 in the last 168 hours BNP (last 3  results) No results found for this basename: PROBNP:3 in the last 8760 hours CBG:  Lab 03/27/12 1158 03/27/12 0728 03/26/12 2109 03/26/12 1634 03/26/12 1126  GLUCAP 254* 143* 123* 269* 150*   No results found for this basename: HGBA1C:5 in the last 72 hours Other Labs: No components found with this basename: POCBNP:3 No results found for this basename: DDIMER:2 in the last 168 hours No results found for this basename: CHOL:2,HDL:2,LDLCALC:2,TRIG:2,CHOLHDL:2,LDLDIRECT:2 in the last 168 hours No results found for this basename: TSH,T4TOTAL,FREET3,T3FREE,FREET4,THYROIDAB in the last 168 hours  Lab 03/25/12 0635  VITAMINB12 --  FOLATE --  FERRITIN --  TIBC 249  IRON 56  RETICCTPCT --    Micro Results: No results found for this or any previous visit (from the past 240 hour(s)).  Studies/Results: No results found.  Medications: I have reviewed the patient's current medications. Scheduled Meds:   . aspirin  81 mg Oral Daily  . clopidogrel  75 mg Oral Q breakfast  . docusate sodium  100 mg Oral Daily  . enoxaparin (LOVENOX) injection  40 mg Subcutaneous Q24H  . ezetimibe-simvastatin  1 tablet Oral QHS  . feeding supplement  30 mL Oral BID WC  . feeding supplement  1 Container Oral BID BM  . insulin aspart  0-9 Units Subcutaneous TID WC  . iron dextran (INFED/DEXFERRUM) infusion  500 mg Intravenous Daily  . LORazepam  2 mg Oral QHS  . Tamsulosin HCl  0.4 mg Oral QPC breakfast  . DISCONTD: alvimopan  12 mg Oral BID   Continuous Infusions:   . sodium chloride     PRN Meds:.sodium chloride, acetaminophen, acetaminophen, albuterol, ipratropium, metoprolol, morphine injection, nitroGLYCERIN, ondansetron (ZOFRAN) IV, ondansetron, oxyCODONE  Assessment/Plan: Abdominal pain/Colonic mass (Adeno CA), hemicolectomy done 03/22/2012 Abdominal/pelvic CT scan showed findings most consistent with adenocarcinoma of the cecum/ascending colon, 22 x 46 x 23 mm soft tissue mass. Small but  conspicuous mesenteric lymph node nearby measuring 5 mm short axis. No liver or distant metastatic disease identified in the abdomen pelvis. Dr. Lina Sar saw pt, and colonoscopy was done 6/26 - mass in ascending colon. and CEA Of 03/12/12 was 3.2. Status post right hemicolectomy by Dr. Jimmye Norman on 03/22/2012. Taking clear liquids. Much improved pain control, surgery following the patient. Incentive spirometry every hour while awake. Oncology has been consulted, Dr. Thedore Mins discussed with Dr. Chrissie Noa.  Currently diet advanced to full liquids.  Anemia, Fe def Iron deficiency anemia worsened by blood loss do to adenocarcinoma of the colon - hemoglobin dropped to 7.1 and he had been transfused earlier in his hospital stay. Hgb improved s/p transfusion, stable. Goal Hb>8 due to CAD burden. His anemia panel was consistent with severe iron deficiency, IV iron was given to the patient on 03/25/2012.   CAD (coronary artery disease)/+stress test Patient has a known history of CAD, s/p MI,s/p CABG. stress test positive, left heart catheterization done on 03/2012 suggestive of multivessel stenosis, patient is symptom free on aspirin, started on plavix. Dr. Sharyn Lull, cardiology following.  Stenting at a later date per cardiology.   Blood pressure BP runs soft at baseline, baseline improved with IV fluids, will hold IV fluids and monitor blood pressures and intake and output.   COPD (chronic obstructive pulmonary disease) Stable/aymptomatic. PRN Nebs-o2.   H/O: CVA (cerebrovascular accident) Stable. On low-dose aspirin and plavix.   Diabetes mellitus Continue ssi changed to q. a.c.   Urinary retention which happened earlier during his hospital stay  Patient was placed on Flomax, attempted to remove his Foley on 03/25/2012, however he developed urinary retention again, Foley was replaced Flomax to be continued, will reattempt to take the Foley out once narcotic use is minimizing activities increased.   L  subclavian stenosis  Found incidentally during left heart catheterization. Will call vascular surgery once patient's acute issues have resolved.   Hypokalemia Resolved with replacement, magnesium normal.   DVT Prophylaxis SCDs - lovenox   Procedures CT scan abdomen pelvis, Cardiolite stress test, left heart catheterization, Rt Hemicolectomy   Consults Gen. surgery, cardiology, PT  Disposition Pending.  Consider possible discharge in 24 hours if agreeable with surgery once diet is advanced.    LOS: 16 days  Leighla Chestnutt A, MD 03/27/2012, 1:12 PM

## 2012-03-27 NOTE — Progress Notes (Signed)
Subjective:  Patient denies any chest pain or shortness of breath. Discussed with daughter at length regarding PCI to left circumflex and saphenous and graft to obtuse marginal and few weeks once he recovers from his right hemicolectomy. Daughter agrees and understands the risk and benefits.  Objective:  Vital Signs in the last 24 hours: Temp:  [98.2 F (36.8 C)-99.5 F (37.5 C)] 98.6 F (37 C) (07/10 0500) Pulse Rate:  [65-87] 65  (07/10 0500) Resp:  [18] 18  (07/10 0500) BP: (98-148)/(62-72) 107/67 mmHg (07/10 0500) SpO2:  [94 %] 94 % (07/10 0500)  Intake/Output from previous day: 07/09 0701 - 07/10 0700 In: -  Out: 950 [Urine:950] Intake/Output from this shift:    Physical Exam: Neck: no adenopathy, no carotid bruit, no JVD and supple, symmetrical, trachea midline Lungs: clear to auscultation bilaterally Heart: regular rate and rhythm, S1, S2 normal, no murmur, click, rub or gallop Abdomen: soft, non-tender; bowel sounds normal; no masses,  no organomegaly Extremities: extremities normal, atraumatic, no cyanosis or edema  Lab Results:  Basename 03/26/12 0856 03/25/12 0635  WBC -- 7.6  HGB 9.0* 8.6*  PLT -- 246    Basename 03/27/12 0635 03/26/12 0856 03/25/12 1020  NA 140 -- 140  K 3.5 3.6 --  CL 103 -- 103  CO2 26 -- 26  GLUCOSE 137* -- 163*  BUN 12 -- 9  CREATININE 0.83 -- 0.79   No results found for this basename: TROPONINI:2,CK,MB:2 in the last 72 hours Hepatic Function Panel No results found for this basename: PROT,ALBUMIN,AST,ALT,ALKPHOS,BILITOT,BILIDIR,IBILI in the last 72 hours No results found for this basename: CHOL in the last 72 hours No results found for this basename: PROTIME in the last 72 hours  Imaging: Imaging results have been reviewed and No results found.  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

## 2012-03-27 NOTE — Progress Notes (Signed)
I have seen and examined the patient and agree with the assessment and plans.  Francis Doenges A. Erma Raiche  MD, FACS  

## 2012-03-27 NOTE — Progress Notes (Signed)
MEDICATION RELATED CONSULT NOTE - FOLLOW UP   Pharmacy Consult for IV iron dextran Indication: iron deficiency anemia  No Known Allergies  Patient Measurements: Height: 5\' 5"  (165.1 cm) Weight: 147 lb 11.3 oz (67 kg) IBW/kg (Calculated) : 61.5   Vital Signs: Temp: 98.6 F (37 C) (07/10 0500) BP: 107/67 mmHg (07/10 0500) Pulse Rate: 65  (07/10 0500) Intake/Output from previous day: 07/09 0701 - 07/10 0700 In: -  Out: 950 [Urine:950]  Labs:  Basename 03/27/12 0635 03/26/12 0856 03/25/12 1020 03/25/12 0635  WBC -- -- -- 7.6  HGB -- 9.0* -- 8.6*  HCT -- 28.8* -- 27.4*  PLT -- -- -- 246  APTT -- -- -- --  CREATININE 0.83 -- 0.79 0.76  LABCREA -- -- -- --  CREATININE 0.83 -- 0.79 0.76  CREAT24HRUR -- -- -- --  MG 1.7 1.7 1.8 --  PHOS -- -- -- --  ALBUMIN -- -- -- --  PROT -- -- -- --  ALBUMIN -- -- -- --  AST -- -- -- --  ALT -- -- -- --  ALKPHOS -- -- -- --  BILITOT -- -- -- --  BILIDIR -- -- -- --  IBILI -- -- -- --   Estimated Creatinine Clearance: 72 ml/min (by C-G formula based on Cr of 0.83).    Medications:  Scheduled:          . aspirin  81 mg Oral Daily  . clopidogrel  75 mg Oral Q breakfast  . docusate sodium  100 mg Oral Daily  . enoxaparin (LOVENOX) injection  40 mg Subcutaneous Q24H  . ezetimibe-simvastatin  1 tablet Oral QHS  . feeding supplement  1 Container Oral BID BM  . insulin aspart  0-9 Units Subcutaneous TID WC  . iron dextran (INFED/DEXFERRUM) infusion  500 mg Intravenous Daily  . LORazepam  2 mg Oral QHS  . Tamsulosin HCl  0.4 mg Oral QPC breakfast    Assessment: Pt s/p right hemicolectomy. Today is last dose of IV iron dextran 500mg . H&H have increased.  Goal of Therapy:  Iron > 42, Ferritin > 22, Sat % > 20%, Hgb > 8  Plan:  Have discontinued consult as today is last dose of Iv iron dextran. Suggest oral iron replacement upon discharge.  Zaide Kardell D. Sylvestre Rathgeber, PharmD Clinical Pharmacist Pager: [phone] Phone:  [phone] 03/27/2012 10:43 AM

## 2012-03-27 NOTE — Progress Notes (Signed)
Nutrition Follow-up  Intervention:    Continue Resource Breeze supplement 2 times daily between meals (250 kcals, 9 gm protein per 8 fl oz carton)  Add Prostat liquid protein 2 times daily with meals (100 kcals, 15 gm protein per dose) RD to follow for nutrition care plan  Assessment:   Patient s/p right hemicolectomy 7/5. States his appetite continues to be poor. PO intake 0-50% per flowsheet records. + flatus. Drinking some Resouce Breeze supplements per [employer] fundraiser. Would benefit from additional protein -- RD to order supplement.  Diet Order:  Full Liquids  Meds: Scheduled Meds:   . aspirin  81 mg Oral Daily  . clopidogrel  75 mg Oral Q breakfast  . docusate sodium  100 mg Oral Daily  . enoxaparin (LOVENOX) injection  40 mg Subcutaneous Q24H  . ezetimibe-simvastatin  1 tablet Oral QHS  . feeding supplement  1 Container Oral BID BM  . insulin aspart  0-9 Units Subcutaneous TID WC  . iron dextran (INFED/DEXFERRUM) infusion  500 mg Intravenous Daily  . LORazepam  2 mg Oral QHS  . Tamsulosin HCl  0.4 mg Oral QPC breakfast  . DISCONTD: alvimopan  12 mg Oral BID   Continuous Infusions:   . sodium chloride     PRN Meds:.sodium chloride, acetaminophen, acetaminophen, albuterol, ipratropium, metoprolol, morphine injection, nitroGLYCERIN, ondansetron (ZOFRAN) IV, ondansetron, oxyCODONE  Labs:  CMP     Component Value Date/Time   NA 140 03/27/2012 0635   K 3.5 03/27/2012 0635   CL 103 03/27/2012 0635   CO2 26 03/27/2012 0635   GLUCOSE 137* 03/27/2012 0635   BUN 12 03/27/2012 0635   CREATININE 0.83 03/27/2012 0635   CALCIUM 9.3 03/27/2012 0635   PROT 5.9* 03/12/2012 0436   ALBUMIN 3.0* 03/12/2012 0436   AST 20 03/12/2012 0436   ALT 10 03/12/2012 0436   ALKPHOS 65 03/12/2012 0436   BILITOT 0.2* 03/12/2012 0436   GFRNONAA 87* 03/27/2012 0635   GFRAA >90 03/27/2012 0635     Intake/Output Summary (Last 24 hours) at 03/27/12 1138 Last data filed at 03/26/12 1846  Gross per 24 hour  Intake      0  ml  Output    950 ml  Net   -950 ml    CBG (last 3)   Basename 03/27/12 0728 03/26/12 2109 03/26/12 1634  GLUCAP 143* 123* 269*    Weight Status:  67 kg (7/2) -- no new weight available  Re-estimated needs:  1600-1800 kcals, 80-90 gm protein  Nutrition Dx:  Inadequate Oral Intake, ongoing  Goal:  Oral intake with meals & supplements to meet >/= 90% of estimated nutrition needs, unmet  Monitor:  PO intake, weight, labs, I/O's   Alger Memos, RD, LDN Pager #: [phone] After-Hours Pager #: [phone]

## 2012-03-27 NOTE — Progress Notes (Signed)
Agree with assessment and plan as outlines by Lauren Bajbus, Pharm.D.    Pt has completed IV Fe therapy to replete Fe stores.  Consider oral Fe for maintenance.  [employer], Pharm.D., BCPS Clinical Pharmacist Pager [phone] 03/27/2012 11:32 AM

## 2012-03-28 ENCOUNTER — Other Ambulatory Visit: Payer: Self-pay | Admitting: *Deleted

## 2012-03-28 DIAGNOSIS — D509 Iron deficiency anemia, unspecified: Secondary | ICD-10-CM

## 2012-03-28 DIAGNOSIS — C182 Malignant neoplasm of ascending colon: Principal | ICD-10-CM

## 2012-03-28 LAB — BASIC METABOLIC PANEL
BUN: 11 mg/dL (ref 6–23)
Calcium: 9.3 mg/dL (ref 8.4–10.5)
Creatinine, Ser: 0.77 mg/dL (ref 0.50–1.35)
GFR calc Af Amer: >90 mL/min (ref >90)
GFR calc non Af Amer: 90 mL/min — ABNORMAL LOW (ref >90)
Glucose, Bld: 145 mg/dL — ABNORMAL HIGH (ref 70–99)

## 2012-03-28 LAB — GLUCOSE, CAPILLARY
Glucose-Capillary: 136 mg/dL — ABNORMAL HIGH (ref 70–99)
Glucose-Capillary: 204 mg/dL — ABNORMAL HIGH (ref 70–99)

## 2012-03-28 LAB — CBC
HCT: 26.5 % — ABNORMAL LOW (ref 39.0–52.0)
Hemoglobin: 8.4 g/dL — ABNORMAL LOW (ref 13.0–17.0)
MCH: 25 pg — ABNORMAL LOW (ref 26.0–34.0)
MCHC: 31.7 g/dL (ref 30.0–36.0)
MCV: 78.9 fL (ref 78.0–100.0)
RDW: 16.1 % — ABNORMAL HIGH (ref 11.5–15.5)

## 2012-03-28 MED ORDER — DSS 100 MG PO CAPS: 100.0000 mg | ORAL_CAPSULE | Freq: Every day | ORAL | Status: AC

## 2012-03-28 MED ORDER — ALBUTEROL SULFATE (5 MG/ML) 0.5% IN NEBU: 2.5000 mg | INHALATION_SOLUTION | RESPIRATORY_TRACT | Status: DC | PRN

## 2012-03-28 MED ORDER — IPRATROPIUM BROMIDE 0.02 % IN SOLN: 0.5000 mg | RESPIRATORY_TRACT | Status: DC | PRN

## 2012-03-28 MED ORDER — OXYCODONE HCL 5 MG PO TABS: 5.0000 mg | ORAL_TABLET | Freq: Four times a day (QID) | ORAL | Status: AC | PRN

## 2012-03-28 MED ORDER — ONDANSETRON HCL 4 MG PO TABS: 4.0000 mg | ORAL_TABLET | Freq: Four times a day (QID) | ORAL | Status: AC | PRN

## 2012-03-28 MED ORDER — TAMSULOSIN HCL 0.4 MG PO CAPS: 0.4000 mg | ORAL_CAPSULE | Freq: Every day | ORAL | Status: DC

## 2012-03-28 MED ORDER — INSULIN ASPART 100 UNIT/ML ~~LOC~~ SOLN: 0.0000 [IU] | Freq: Three times a day (TID) | SUBCUTANEOUS | Status: DC

## 2012-03-28 MED ORDER — PRO-STAT SUGAR FREE PO LIQD: 30.0000 mL | Freq: Two times a day (BID) | ORAL | Status: DC

## 2012-03-28 MED ORDER — POTASSIUM CHLORIDE CRYS ER 20 MEQ PO TBCR
40.0000 meq | EXTENDED_RELEASE_TABLET | Freq: Once | ORAL | Status: AC
  Administered 2012-03-28: 40 meq via ORAL
  Filled 2012-03-28: qty 2

## 2012-03-28 MED ORDER — POLYSACCHARIDE IRON COMPLEX 150 MG PO CAPS: 150.0000 mg | ORAL_CAPSULE | Freq: Two times a day (BID) | ORAL | Status: DC

## 2012-03-28 MED ORDER — OXAZEPAM 15 MG PO CAPS: 30.0000 mg | ORAL_CAPSULE | Freq: Every day | ORAL | Status: DC

## 2012-03-28 NOTE — Progress Notes (Signed)
CSW left message with pt daughter regarding pt discharge anticipated for today. Pt, Pt daughter, pt spouse, and csw discussed that a discharge on 7/11 was possible.Pt plans to dc to [place]  .Clinical social worker continuing to follow pt to assist with pt dc plans and further csw needs.    Catha Gosselin, Theresia Majors  [phone] .03/28/2012 10:21am

## 2012-03-28 NOTE — Progress Notes (Signed)
Was called into the pt's room by NT. Pt was crying. When asked was he in pain he denied it. When asked did he want to talk about it the pt answered no also. Will continue to monitor the pt. Sanda Linger

## 2012-03-28 NOTE — Progress Notes (Signed)
I have seen and examined the patient and agree with the assessment and plans.  Dvid Pendry A. Clive Parcel  MD, FACS  

## 2012-03-28 NOTE — Plan of Care (Signed)
Problem: Phase III Progression Outcomes Goal: Voiding independently Outcome: Not Met (add Reason) Pt failed voiding trial. Pt to d/c with foley catheter.  Problem: Phase I Progression Outcomes Goal: Voiding-avoid urinary catheter unless indicated Outcome: Not Met (add Reason) Pt failed voiding trial.  Pt to d/c with foley catheter.

## 2012-03-28 NOTE — Consult Note (Signed)
[facility] CONSULTATION NOTE  Reason for Consult: Colon Cancer   HQI:ONGEXB Val Eagle Hunkele is a 71 y.o. 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 Patient presented to PCP in March 11, 2012 with 1 week history of LLQ pain associated with constipation and low grade fever. He was also found to have anemia, requiring hospitalization and  further GI workup. CT abdomen and pelvis with contrast on 03/11/12 revealed   A 22 x 46 x 23 mm crescent-shaped fungating  soft tissue mass arising in the posterior wall of the right colon, suspicious for adenocarcinoma of the cecum/ascending colon  . Small but conspicuous mesenteric lymph node nearby measuring 5 mm were seen. No liver or distant metastatic disease identified in the abdomen pelvis. CEA on 6/25 was 3.2. Colonoscopy on 03/13/12 ( Dr. Juanda Chance) confirming mass in the ascending colon,with biopsy of the ascending mass positive for adenocarcinoma. There were multiple colon polyps. After cardiac clearance with catheterization on 7/2, he underwent R hemicolectomy on 7/5.Marland Kitchen Pathology report, #: [phone] of the resected ascending colon mass, was positive for invasive, well-differentiated adenocarcinoma with mucinous features, 4.7 cm, , with tumor invading into the pericolonic soft tissue. The visceral peritoneum is  negative for tumor. No lymphovascular or perineural invasion is identified. 0 of 12 lymph nodes are positive. Negative surgical margins. Tubular adenoma x2, are negative for high grade dysplasia or malignancy. Tumor is G1 well differentiated/low grade. Pathologic Staging: pT3, pN0, pMX. Patient is now day 6 post op. We were requested to see him while in the hospital for recommendations regarding adjuvant therapy.   PMH: Past Medical History  Diagnosis Date  . Myocardial infarction   . CVA (cerebral infarction)  April 2013   . Diabetes mellitus     . Hypertension   .  right sided colon cancer (T3 N0)   July 2013   . Aphasia   . Dysphagia   .    Marland Kitchen COPD (chronic obstructive pulmonary disease)   .    Marland Kitchen Anemia-iron deficiency   June 2013     Surgeries: Past Surgical History  Procedure Date  . Coronary artery bypass graft   . Appendectomy   . Dental surgery   . Colonoscopy 03/13/2012    Procedure: COLONOSCOPY;  Surgeon: Hart Carwin, MD;  Location: [facility];  Service: Endoscopy;  Laterality: N/A;  . Partial colectomy 03/22/2012    Procedure: PARTIAL COLECTOMY;  Surgeon: Cherylynn Ridges, MD;  Location: [facility] OR;  Service: General;  Laterality: Right;    Allergies: No Known Allergies  Medications:  Prior to Admission:  Prescriptions prior to admission  Medication Sig Dispense Refill  . amoxicillin (AMOXIL) 500 MG capsule Take 500 mg by mouth 3 (three) times daily.      Marland Kitchen aspirin EC 81 MG tablet Take 81 mg by mouth daily. For CVA prophylaxis.      Marland Kitchen benzonatate (TESSALON) 100 MG capsule Take 100 mg by mouth 3 (three) times daily. For cough.      . clopidogrel (PLAVIX) 75 MG tablet Take 75 mg by mouth daily with breakfast.      . ezetimibe-simvastatin (VYTORIN) 10-40 MG per tablet Take 1 tablet by mouth at bedtime.      . Multiple Vitamin (MULITIVITAMIN WITH MINERALS) TABS Take 1 tablet by mouth daily.      . nitroGLYCERIN (NITROLINGUAL) 0.4 MG/SPRAY spray Place 1 spray under the tongue every 5 (  five) minutes as needed. Chest pain. Every 5 minutes up to 3 doses.      Marland Kitchen oxazepam (SERAX) 15 MG capsule Take 30 mg by mouth at bedtime.      . sitaGLIPtan-metformin (JANUMET) 50-1000 MG per tablet Take 1 tablet by mouth 2 (two) times daily with a meal.      . valsartan (DIOVAN) 160 MG tablet Take 160 mg by mouth daily.      . vitamin C (ASCORBIC ACID) 500 MG tablet Take 500 mg by mouth 2 (two) times daily.      Marland Kitchen zinc sulfate 220 MG capsule Take 220 mg by mouth daily.        ZOX:WRUEAV chloride, acetaminophen, acetaminophen, albuterol,  ipratropium, metoprolol, morphine injection, nitroGLYCERIN, ondansetron (ZOFRAN) IV, ondansetron, oxyCODONE  ROS: Limited due to the patient's expressive aphasia. Constitutional: Positive for weight loss. Negative for fever, chills and positive for  malaise/fatigue.  Eyes: Negative for blurred vision and double vision.  Respiratory: Negative for cough, hemoptysis and shortness of breath.  Cardiovascular: Negative for chest pain. GI: No nausea, vomiting, diarrhea, constipation. No change in bowel caliber. No  Melena or hematochezia. Back in June, he had left lower abdominal pain associated with constipation and low-grade fever  GU: No blood in urine. No loss of urinary control. Skin: Negative for itching. No rash. No petechia. No bruising Neurological: No headaches. No sensory deficits.Expressive aphasia as above. Right upper extremity is 3-4/5 in strength, with the rest of the extremities in normal limits.   Family History:  Family History  Problem Relation Age of Onset  . Breast cancer Mother   . Heart disease Brother   . Colon cancer Maternal Uncle     Social History:  reports that he quit smoking about 3 months ago. His smoking use included Cigarettes. He quit after 16 years of use. He has never used smokeless tobacco. He reports that he does not drink alcohol or use illicit drugs.  Physical Exam  71 year old wm  in no acute distress A. and O. x3 General well-developed and well-nourished  HEENT: Normocephalic, atraumatic, PERRLA. Oral cavity without thrush or lesions. Neck supple. no thyromegaly, no cervical or supraclavicular adenopathy  Lungs clear bilaterally . No wheezing, rhonchi or rales. No axillary masses. Breasts: not examined. Cardiac regular rate and rhythm normal S1-S2, no murmur , rubs or gallops Abdomen soft nontender , bowel sounds x4. No HSM. Well healed vertical scar on the RUQ, at the right hemicolectomy site no visible areas of infection are noted. No  hepatomegaly GU-testes without mass Extremities no clubbing cyanosis. Trace edema on both lower extremities.  No bruising or petechial rash Neuro expressive aphasia. 4/5 strength at the right arm and hand     Labs:  CBC   Lab 03/28/12 0607 03/26/12 0856 03/25/12 0635 03/24/12 0500 03/23/12 1306 03/23/12 0525 03/22/12 0545  WBC 6.1 -- 7.6 7.3 -- 10.3 5.9  HGB 8.4* 9.0* 8.6* 9.2* 10.0* -- --  HCT 26.5* 28.8* 27.4* 29.1* 31.3* -- --  PLT 283 -- 246 243 -- 247 235  MCV 78.9 -- 78.1 78.6 -- 77.1* 79.1  MCH 25.0* -- 24.5* 24.9* -- 24.6* 25.8*  MCHC 31.7 -- 31.4 31.6 -- 31.9 32.6  RDW 16.1* -- 14.9 14.9 -- 14.4 15.0  LYMPHSABS -- -- -- -- -- -- --  MONOABS -- -- -- -- -- -- --  EOSABS -- -- -- -- -- -- --  BASOSABS -- -- -- -- -- -- --  BANDABS -- -- -- -- -- -- --  CEA on 03/12/2012-3.2   CMP    Lab 03/28/12 0607 03/27/12 0635 03/26/12 0856 03/25/12 1020 03/25/12 0635 03/24/12 0500  NA 141 140 -- 140 139 141  K 3.3* 3.5 3.6 3.3* 3.1* --  CL 104 103 -- 103 103 108  CO2 26 26 -- 26 26 24   GLUCOSE 145* 137* -- 163* 132* 145*  BUN 11 12 -- 9 9 7   CREATININE 0.77 0.83 -- 0.79 0.76 0.77  CALCIUM 9.3 9.3 -- 9.0 8.8 8.7  MG 1.6 1.7 1.7 1.8 1.6 --  AST -- -- -- -- -- --  ALT -- -- -- -- -- --  ALKPHOS -- -- -- -- -- --  BILITOT -- -- -- -- -- --        Component Value Date/Time   BILITOT 0.2* 03/12/2012 0436       Imaging Studies:  Ct Abdomen Pelvis W Contrast  03/11/2012 *RADIOLOGY REPORT* Clinical Data: 71 year old male with left lower quadrant abdominal pain. History of kidney stones. CT ABDOMEN AND PELVIS WITH CONTRAST Technique: Multidetector CT imaging of the abdomen and pelvis was performed following the standard protocol during bolus administration of intravenous contrast. Contrast: 80mL OMNIPAQUE IOHEXOL 300 MG/ML SOLN Comparison: Abdominal radiographs 03/07/2012. Findings: Dependent opacity and crowding of markings at the lung bases compatible with atelectasis.  Mild cardiomegaly. No pericardial or pleural effusion. Sequelae median sternotomy. Degenerative changes in the spine. No acute osseous abnormality identified. No pelvic free fluid. The bladder is distended and yet there is evidence of bladder wall thickening. No perivesical stranding. The prostate is enlarged and lobulated and there is mild contour deformity at the base of the bladder. Negative distal colon aside from retained stool. Oral contrast has reached the splenic flexure. No wall thickening or inflammation of the left colon. Contrast mixed with stool throughout the more proximal colon. There is a the crescent-shaped fungating soft tissue mass arising in the posterior wall of the right colon on series 5 image 30 measuring 22 x 46 x 23 mm (AP by transverse by CC, coronal image 45, sagittal image 20). A conspicuous but small lymph node in the adjacent mesentery measures 5 mm in short axis (series 2 image 52). No mesenteric lymphadenopathy. This lesion is nonobstructing. The lesion is only 1 to 2 cm distal to the ileocecal valve which is best demonstrated on coronal image 33. Distal small bowel loops are nondilated. There may be mild distal small bowel wall thickening not involving the terminal ileum in the right lower quadrant (series 2 image 66) but this may be artifact of peristalsis. No dilated small bowel. Negative stomach, duodenum, spleen, pancreas, and adrenal glands. No liver mass identified. Dependent cholelithiasis. No pericholecystic inflammation. Multiple low-density areas in both kidneys probably are benign cyst. There is a 7-8 mm calculus located in the right renal pelvis but not causing obstruction at this time. No hydronephrosis or hydroureter. Mild perinephric stranding may be age related. No abdominal free fluid or lymphadenopathy. Atherosclerosis of the abdominal aorta and iliac arteries. IMPRESSION: 1. Findings most consistent with adenocarcinoma of the cecum/ascending colon (see series 2  image 46 and coronal image 45). 22 x 46 x 23 mm soft tissue mass. Small but conspicuous mesenteric lymph node nearby measuring 5 mm short axis. No liver or distant metastatic disease identified in the abdomen pelvis. 2. Kidney stone at the right renal pelvis but without associated obstruction at this time. 3. The bladder is distended with evidence of wall thickening. The prostate is enlarged. Chronic  bladder outlet obstruction suspected and urology follow up to include prostate evaluation recommended. 4. Cholelithiasis. Aortic atherosclerosis. Salient findings discussed by telephone with provider Maren Reamer on 03/11/2012 at Original Report Authenticated By: Harley Hallmark, M.D.          A/P: 71 y.o. 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 Patient is recuperating well and is to be discharged.  Dr. Truett Perna is to write an addendum to these note, with plans to followup as an outpatient once he is discharged from the hospital.  Thank you for the referral.  [facility] E 03/28/2012 8:52 AM  The patient was seen and examined, chart reviewed, family not present this morning.  Impression:  1. Adenocarcinoma of the a sending colon (pT3,pN0 ), status post a right hemicolectomy on 03/22/2012  2. Iron deficiency anemia  3. Coronary artery disease  4. Status post a CVA in April of 2013  5. COPD  6. Diabetes  7. Hypertension  8. Multiple colon polyps on the colonoscopy 03/13/2012  Mr. Stmartin has been diagnosed with stage II colon cancer. I reviewed the pathology report and discuss the prognosis/adjuvant treatment options with Mr. Colson today. He has a good prognosis for a long-term disease-free survival. There are no "high-risk "features associated with his stage II colon cancer. I do not recommend adjuvant chemotherapy.  Recommendations:  1. Iron replacement  2. Surveillance colonoscopies  with Dr. Juanda Chance  3. I will arrange for outpatient appointment at the [facility] to discuss the colon cancer diagnosis with his family.

## 2012-03-28 NOTE — Progress Notes (Signed)
Physical Therapy Treatment Patient Details Name: [name] MRN: [id number] DOB: [birth year] Today's Date: 03/28/2012 Time: 1027-1050 PT Time Calculation (min): 23 min  PT Assessment / Plan / Recommendation Comments on Treatment Session  Patient with increased balance and safety with gait and standing activities.     Follow Up Recommendations  Skilled nursing facility    Barriers to Discharge  None      Equipment Recommendations  Defer to next venue    Recommendations for Other Services  None  Frequency Min 3X/week   Plan Discharge plan remains appropriate;Frequency remains appropriate    Precautions / Restrictions Precautions Precautions: Fall   Pertinent Vitals/Pain VSS/ No pain    Mobility  Bed Mobility Rolling Left: 6: Modified independent (Device/Increase time);With rail Left Sidelying to Sit: 5: Supervision;With rails Sitting - Scoot to Edge of Bed: 6: Modified independent (Device/Increase time) Details for Bed Mobility Assistance: Consistently able to perform bed mobility and transfer to sit without physical assistance. Intermittent cueing for more efficient technique.  Transfers Sit to Stand: With upper extremity assist;From bed Stand to Sit: 5: Supervision;With upper extremity assist;To chair/3-in-1 Details for Transfer Assistance: Repeated for correct hand placement to and from device until patient could consistently perform.  Ambulation/Gait Ambulation/Gait Assistance: 4: Min guard Ambulation Distance (Feet): 160 Feet Assistive device: Rolling walker Ambulation/Gait Assistance Details: Improved stability with gait. Patient's main issue is maintaining his grip on right side of the walker. As his grip loosens he has a tendency to have difficulty regripping and navigating walker.  Gait Pattern: Step-through pattern;Decreased stride length     PT Goals Acute Rehab PT Goals PT Goal: Supine/Side to Sit - Progress: Met PT Goal: Sit to Stand - Progress:  Progressing toward goal PT Goal: Ambulate - Progress: Progressing toward goal  Visit Information  Last PT Received On: 03/28/12 Assistance Needed: +1    Subjective Data  Subjective: Patient without complaints today. Patient Stated Goal: Home   Cognition  Overall Cognitive Status: Impaired Area of Impairment: Following commands Arousal/Alertness: Awake/alert Orientation Level: Appears intact for tasks assessed Behavior During Session: [facility] for tasks performed Following Commands: Follows multi-step commands inconsistently    Balance  High Level Balance High Level Balance Comments: Continues to demonstrate improved stability with gait without the need for external balance correction. Worked in standing on reaching activities with clsoe guarding and no upper extremity support and return to midline. Patient with difficulty returning to midline with weight shift to the right.   End of Session PT - End of Session Equipment Utilized During Treatment: Gait belt Activity Tolerance: Patient tolerated treatment well Patient left: in chair;with call bell/phone within reach;with chair alarm set    Edwyna Perfect, PT  Pager [phone]  03/28/2012, 11:06 AM

## 2012-03-28 NOTE — Progress Notes (Signed)
Subjective: Eating solid food. No specific complaints.  Objective: Vital signs in last 24 hours: Filed Vitals:   03/27/12 0500 03/27/12 1400 03/27/12 2115 03/28/12 0546  BP: 107/67 139/70 129/72 154/75  Pulse: 65 78 81 74  Temp: 98.6 F (37 C) 99.4 F (37.4 C) 98.7 F (37.1 C) 99.4 F (37.4 C)  TempSrc:   Oral Oral  Resp: 18 18 20 18   Height:      Weight:      SpO2: 94% 94% 96% 93%   Weight change:   Intake/Output Summary (Last 24 hours) at 03/28/12 1229 Last data filed at 03/28/12 1100  Gross per 24 hour  Intake     75 ml  Output   1950 ml  Net  -1875 ml    Physical Exam: General: Awake, Oriented, No acute distress. HEENT: EOMI. Neck: Supple CV: S1 and S2 Lungs: Clear to ascultation bilaterally Abdomen: Soft, Nontender, Nondistended, +bowel sounds. Ext: Good pulses. Trace edema.  Lab Results: Basic Metabolic Panel:  Lab 03/28/12 1610 03/27/12 0635 03/26/12 0856 03/25/12 1020 03/25/12 0635 03/24/12 0500  NA 141 140 -- 140 139 141  K 3.3* 3.5 3.6 3.3* 3.1* --  CL 104 103 -- 103 103 108  CO2 26 26 -- 26 26 24   GLUCOSE 145* 137* -- 163* 132* 145*  BUN 11 12 -- 9 9 7   CREATININE 0.77 0.83 -- 0.79 0.76 0.77  CALCIUM 9.3 9.3 -- 9.0 8.8 8.7  MG 1.6 1.7 1.7 1.8 1.6 --  PHOS -- -- -- -- -- --   Liver Function Tests: No results found for this basename: AST:5,ALT:5,ALKPHOS:5,BILITOT:5,PROT:5,ALBUMIN:5 in the last 168 hours No results found for this basename: LIPASE:5,AMYLASE:5 in the last 168 hours No results found for this basename: AMMONIA:5 in the last 168 hours CBC:  Lab 03/28/12 0607 03/26/12 0856 03/25/12 0635 03/24/12 0500 03/23/12 1306 03/23/12 0525 03/22/12 0545  WBC 6.1 -- 7.6 7.3 -- 10.3 5.9  NEUTROABS -- -- -- -- -- -- --  HGB 8.4* 9.0* 8.6* 9.2* 10.0* -- --  HCT 26.5* 28.8* 27.4* 29.1* 31.3* -- --  MCV 78.9 -- 78.1 78.6 -- 77.1* 79.1  PLT 283 -- 246 243 -- 247 235   Cardiac Enzymes: No results found for this basename:  CKTOTAL:5,CKMB:5,CKMBINDEX:5,TROPONINI:5 in the last 168 hours BNP (last 3 results) No results found for this basename: PROBNP:3 in the last 8760 hours CBG:  Lab 03/28/12 1135 03/28/12 0727 03/27/12 2046 03/27/12 1652 03/27/12 1158  GLUCAP 204* 136* 143* 158* 254*   No results found for this basename: HGBA1C:5 in the last 72 hours Other Labs: No components found with this basename: POCBNP:3 No results found for this basename: DDIMER:2 in the last 168 hours No results found for this basename: CHOL:2,HDL:2,LDLCALC:2,TRIG:2,CHOLHDL:2,LDLDIRECT:2 in the last 168 hours No results found for this basename: TSH,T4TOTAL,FREET3,T3FREE,FREET4,THYROIDAB in the last 168 hours  Lab 03/25/12 0635  VITAMINB12 --  FOLATE --  FERRITIN --  TIBC 249  IRON 56  RETICCTPCT --    Micro Results: No results found for this or any previous visit (from the past 240 hour(s)).  Studies/Results: No results found.  Medications: I have reviewed the patient's current medications. Scheduled Meds:    . aspirin  81 mg Oral Daily  . clopidogrel  75 mg Oral Q breakfast  . docusate sodium  100 mg Oral Daily  . enoxaparin (LOVENOX) injection  40 mg Subcutaneous Q24H  . ezetimibe-simvastatin  1 tablet Oral QHS  . feeding supplement  30 mL  Oral BID WC  . feeding supplement  1 Container Oral BID BM  . insulin aspart  0-9 Units Subcutaneous TID WC  . iron dextran (INFED/DEXFERRUM) infusion  500 mg Intravenous Daily  . iron polysaccharides  150 mg Oral BID  . LORazepam  2 mg Oral QHS  . potassium chloride  40 mEq Oral Once  . Tamsulosin HCl  0.4 mg Oral QPC breakfast   Continuous Infusions:    . sodium chloride     PRN Meds:.sodium chloride, acetaminophen, acetaminophen, albuterol, ipratropium, metoprolol, morphine injection, nitroGLYCERIN, ondansetron (ZOFRAN) IV, ondansetron, oxyCODONE  Assessment/Plan: Abdominal pain/Colonic mass (Adeno CA), hemicolectomy done 03/22/2012 Abdominal/pelvic CT scan showed  findings most consistent with adenocarcinoma of the cecum/ascending colon, 22 x 46 x 23 mm soft tissue mass. Small but conspicuous mesenteric lymph node nearby measuring 5 mm short axis. No liver or distant metastatic disease identified in the abdomen pelvis. Dr. Lina Sar saw pt, and colonoscopy was done 6/26 - mass in ascending colon. and CEA Of 03/12/12 was 3.2. Status post right hemicolectomy by Dr. Jimmye Norman on 03/22/2012. Taking clear liquids. Much improved pain control, surgery following the patient. Incentive spirometry every hour while awake. Oncology has been consulted, Dr. Truett Perna in the process of evaluating the patient. Diet advanced to low residue diet.  Anemia, Fe def Iron deficiency anemia worsened by blood loss do to adenocarcinoma of the colon - hemoglobin dropped to 7.1 and he had been transfused earlier in his hospital stay. Hgb improved s/p transfusion, stable. Goal Hb>8 due to CAD burden. His anemia panel was consistent with severe iron deficiency, IV iron was given to the patient on 03/25/2012. Continue supplemental iron at discharge.  CAD (coronary artery disease)/+stress test Patient has a known history of CAD, s/p MI,s/p CABG. stress test positive, left heart catheterization done on 03/2012 suggestive of multivessel stenosis, patient is symptom free on aspirin, started on plavix. Dr. Sharyn Lull, cardiology following.  Stenting at a later date per cardiology.   Blood pressure BP runs soft at baseline, baseline improved with IV fluids, will hold IV fluids and monitor blood pressures and intake and output.   COPD (chronic obstructive pulmonary disease) Stable/aymptomatic. PRN Nebs-o2.   H/O: CVA (cerebrovascular accident) Stable. On low-dose aspirin and plavix.   Diabetes mellitus Continue ssi changed to q. a.c.   Urinary retention which happened earlier during his hospital stay  Patient was placed on Flomax, attempted to remove his Foley on 03/25/2012, however he  developed urinary retention again, Foley was replaced Flomax to be continued. Will need voiding trail as SNF once narcotic use is minimizing activities increased.   L subclavian stenosis  Found incidentally during left heart catheterization. Will need vascular surgery once patient's acute issues have resolved.   Hypokalemia Resolved with replacement, magnesium normal.   DVT Prophylaxis SCDs - lovenox   Procedures CT scan abdomen pelvis, Cardiolite stress test, left heart catheterization, Rt Hemicolectomy   Consults Gen. surgery, cardiology, PT  Disposition DC to SNF today.     LOS: 17 days  Amaris Garrette A, MD 03/28/2012, 12:29 PM

## 2012-03-28 NOTE — Progress Notes (Signed)
Pt discharged to [facility] per MD order.  Pt to be discharged with foley catheter per MD order.  Pt alert at discharge. RN reviewed discharge instructions with pt. Pt unable to sign discharge paperwork. Report called to [employer]. Pt transported to [place] facility via [facility].  Efraim Kaufmann

## 2012-03-28 NOTE — Progress Notes (Signed)
Patient ID: [name], male   DOB: July 06, 1941, 71 y.o.   MRN: [id number] Patient ID: [name], male   DOB: 06-26-1941, 71 y.o.   MRN: [id number] 6 Days Post-Op  Subjective:  Appears comfortable, no reports of any abdominal pain, + flatus,  No N/V. Tolerating advanced diet.  Objective: Vital signs in last 24 hours: Temp:  [98.7 F (37.1 C)-99.4 F (37.4 C)] 99.4 F (37.4 C) (07/11 0546) Pulse Rate:  [74-81] 74  (07/11 0546) Resp:  [18-20] 18  (07/11 0546) BP: (129-154)/(70-75) 154/75 mmHg (07/11 0546) SpO2:  [93 %-96 %] 93 % (07/11 0546) Last BM Date: 03/22/12  Intake/Output from previous day: 07/10 0701 - 07/11 0700 In: -  Out: 1675 [Urine:1675] Intake/Output this shift:    Abdomen soft, non distended, incision clean, staples intact without erythema.  Lab Results:   [facility] 03/28/12 0607 03/26/12 0856  WBC 6.1 --  HGB 8.4* 9.0*  HCT 26.5* 28.8*  PLT 283 --   BMET  Basename 03/28/12 0607 03/27/12 0635  NA 141 140  K 3.3* 3.5  CL 104 103  CO2 26 26  GLUCOSE 145* 137*  BUN 11 12  CREATININE 0.77 0.83  CALCIUM 9.3 9.3   PT/INR No results found for this basename: LABPROT:2,INR:2 in the last 72 hours ABG No results found for this basename: PHART:2,PCO2:2,PO2:2,HCO3:2 in the last 72 hours  Studies/Results: No results found.  Anti-infectives: Anti-infectives     Start     Dose/Rate Route Frequency Ordered Stop   03/22/12 1800   cefOXitin (MEFOXIN) 1 g in dextrose 5 % 50 mL IVPB        1 g 100 mL/hr over 30 Minutes Intravenous Every 6 hours 03/22/12 1606 03/23/12 0645   03/22/12 0000   cefOXitin (MEFOXIN) 2 g in dextrose 5 % 50 mL IVPB        2 g 100 mL/hr over 30 Minutes Intravenous 60 min pre-op 03/21/12 1336 03/22/12 1145   03/21/12 1330   cefOXitin (MEFOXIN) 2 g in dextrose 5 % 50 mL IVPB  Status:  Discontinued        2 g 100 mL/hr over 30 Minutes Intravenous 60 min pre-op 03/21/12 1330 03/21/12 1336   03/21/12 0600   erythromycin  (E-MYCIN) tablet 1,000 mg        1,000 mg Oral 4 times per day 03/20/12 0939 03/22/12 0015   03/21/12 0600   neomycin (MYCIFRADIN) tablet 1,000 mg        1,000 mg Oral 4 times per day 03/20/12 0939 03/22/12 0016   03/11/12 2000   ciprofloxacin (CIPRO) IVPB 400 mg  Status:  Discontinued        400 mg 200 mL/hr over 60 Minutes Intravenous Every 12 hours 03/11/12 1711 03/20/12 0939   03/11/12 2000   metroNIDAZOLE (FLAGYL) IVPB 500 mg  Status:  Discontinued        500 mg 100 mL/hr over 60 Minutes Intravenous Every 8 hours 03/11/12 1711 03/20/12 0939          Assessment/Plan: s/p Procedure(s) (LRB): PARTIAL COLECTOMY (Right)  Agree with plan for  discharge as per discussion with medicine team yesterday. Tolerating advancement in diet with out N/V. Patient to follow up in our office as scheduled.   LOS: 17 days    Terrez Ander 03/28/2012

## 2012-03-28 NOTE — Progress Notes (Signed)
.  Clinical social worker assisted with patient discharge to skilled nursing facility, [facility]. .Patient transportation provided by [employer] [facility] with patient chart copy. .No further Clinical Social Work needs, signing off.   Catha Gosselin, Theresia Majors  [phone] .03/28/2012 1649pm

## 2012-03-28 NOTE — Progress Notes (Signed)
Subjective:  Patient denies any chest pain or shortness of breath. Had frequent episodes of PVCs occasional couplets and bigeminy asymptomatic  Objective:  Vital Signs in the last 24 hours: Temp:  [98.7 F (37.1 C)-99.4 F (37.4 C)] 99.4 F (37.4 C) (07/11 0546) Pulse Rate:  [74-81] 74  (07/11 0546) Resp:  [18-20] 18  (07/11 0546) BP: (129-154)/(70-75) 154/75 mmHg (07/11 0546) SpO2:  [93 %-96 %] 93 % (07/11 0546)  Intake/Output from previous day: 07/10 0701 - 07/11 0700 In: -  Out: 1675 [Urine:1675] Intake/Output from this shift:    Physical Exam: Neck: no adenopathy, no carotid bruit, no JVD and supple, symmetrical, trachea midline Lungs: clear to auscultation bilaterally Heart: regular rate and rhythm, S1, S2 normal, no murmur, click, rub or gallop Abdomen: soft, non-tender; bowel sounds normal; no masses,  no organomegaly Extremities: extremities normal, atraumatic, no cyanosis or edema  Lab Results:  Basename 03/28/12 0607 03/26/12 0856  WBC 6.1 --  HGB 8.4* 9.0*  PLT 283 --    Basename 03/28/12 0607 03/27/12 0635  NA 141 140  K 3.3* 3.5  CL 104 103  CO2 26 26  GLUCOSE 145* 137*  BUN 11 12  CREATININE 0.77 0.83   No results found for this basename: TROPONINI:2,CK,MB:2 in the last 72 hours Hepatic Function Panel No results found for this basename: PROT,ALBUMIN,AST,ALT,ALKPHOS,BILITOT,BILIDIR,IBILI in the last 72 hours No results found for this basename: CHOL in the last 72 hours No results found for this basename: PROTIME in the last 72 hours  Imaging: Imaging results have been reviewed and No results found.  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 Check BMP and Mg a.m.  LOS: 17 days    Glennys Schorsch N 03/28/2012, 11:24 AM

## 2012-03-28 NOTE — Discharge Summary (Addendum)
Physician Discharge Summary  [name] BJY:[id number] DOB: Jun 11, 1941 DOA: 03/11/2012  PCP: Delorse Lek, MD  Admit date: 03/11/2012 Discharge date: 03/28/2012  Recommendations for Outpatient Follow-up:  1. Followup with Dr. Sharyn Lull for cardiology workup, Followup with Dr. Truett Perna Oncology, Followup with Dr. Lindie Spruce (surgery), Followup with Dr. Juanda Chance, GI. Will need voiding trial at SNF. Will need vascular surgery for Left subclavian stenosis found incidentally as outpatient.  Discharge Diagnoses:  Principal Problem:  *Abdominal pain Active Problems:  Anemia  CAD (coronary artery disease)  HTN (hypertension)  COPD (chronic obstructive pulmonary disease)  H/O: CVA (cerebrovascular accident)  Diabetes mellitus  Dehydration  Nonspecific abnormal finding in stool contents  Nonspecific (abnormal) findings on radiological and other examination of gastrointestinal tract  Neoplasm of unspecified nature of digestive system  Benign neoplasm of colon   Discharge Condition: Stable  Diet recommendation: Low residue diabetic diet  History of present illness:  On admission "This is a 71 year old male with known history of CVA in 2013 with residual dysarthria, insulin-requiring diabetes mellitus type 2, HTN, COPD, CAD, s/p MI, s/p CABG, s/p remote appendectomy, referred for direct admission, by Dr Erick Blinks, who had seen patient in his office today, via referral from patient's PMD, for an approximately one week history of left lower abdominal pain, associated with constipation and low grade fever, up to 100 degrees Farenheit. He was incidentally, noted to have an anemia of 9.4, MCV 82.1."  Hospital Course:  Abdominal pain/Colonic mass (Adeno CA), hemicolectomy done 03/22/2012  Abdominal/pelvic CT scan showed findings most consistent with adenocarcinoma of the cecum/ascending colon, 22 x 46 x 23 mm soft tissue mass. Small but conspicuous mesenteric lymph node nearby measuring 5 mm short  axis. No liver or distant metastatic disease identified in the abdomen pelvis. Dr. Lina Sar saw pt, and colonoscopy was done 6/26 - mass in ascending colon. and CEA Of 03/12/12 was 3.2. Status post right hemicolectomy by Dr. Jimmye Norman on 03/22/2012. Much improved pain control, surgery following the patient. Incentive spirometry every hour while awake. Oncology has been consulted, Dr. Truett Perna in the process of evaluating the patient. Diet advanced to low residue diet.   Anemia, Fe def  Iron deficiency anemia worsened by blood loss do to adenocarcinoma of the colon - hemoglobin dropped to 7.1 and he had been transfused earlier in his hospital stay. Hgb improved s/p transfusion, stable. Goal Hb>8 due to CAD burden. His anemia panel was consistent with severe iron deficiency, IV iron was given to the patient on 03/25/2012. Continue supplemental iron at discharge.   CAD (coronary artery disease)/+stress test  Patient has a known history of CAD, s/p MI,s/p CABG. stress test positive, left heart catheterization done on 03/2012 suggestive of multivessel stenosis, patient is symptom free on aspirin, started on plavix. Dr. Sharyn Lull, cardiology following. Possible stenting at a later date per cardiology as outpatient.  Blood pressure  BP runs soft at baseline, baseline improved with IV fluids, will hold IV fluids and monitor blood pressures and intake and output.   COPD (chronic obstructive pulmonary disease)  Stable/aymptomatic. PRN Nebs-o2.   H/O: CVA (cerebrovascular accident)  Stable. On low-dose aspirin and plavix.   Diabetes mellitus  Continue ssi changed to q. a.c.   Urinary retention which happened earlier during his hospital stay  Patient was placed on Flomax, attempted to remove his Foley on 03/25/2012, however he developed urinary retention again, Foley was replaced Flomax to be continued. Will need voiding trail as SNF once narcotic use  is minimized and activities are increased.   L  subclavian stenosis  Found incidentally during left heart catheterization. Will need vascular surgery once patient's acute issues have resolved.   Hypokalemia  Resolved with replacement, magnesium normal.   Procedures  CT scan abdomen pelvis, Cardiolite stress test, left heart catheterization, Rt Hemicolectomy   Consults  Gen. surgery, cardiology, PT, oncology, GI  Discharge Exam: Filed Vitals:   03/28/12 0546  BP: 154/75  Pulse: 74  Temp: 99.4 F (37.4 C)  Resp: 18   Filed Vitals:   03/27/12 0500 03/27/12 1400 03/27/12 2115 03/28/12 0546  BP: 107/67 139/70 129/72 154/75  Pulse: 65 78 81 74  Temp: 98.6 F (37 C) 99.4 F (37.4 C) 98.7 F (37.1 C) 99.4 F (37.4 C)  TempSrc:   Oral Oral  Resp: 18 18 20 18   Height:      Weight:      SpO2: 94% 94% 96% 93%   Discharge Instructions  Discharge Orders    Future Orders Please Complete By Expires   Increase activity slowly      Discharge instructions      Comments:   Followup with BURNETT,BRENT A, MD (PCP) in 1 week.  Diet: Low residue diabetic diet.     Medication List  As of 03/28/2012 12:41 PM   STOP taking these medications         amoxicillin 500 MG capsule      valsartan 160 MG tablet         TAKE these medications         albuterol (5 MG/ML) 0.5% nebulizer solution   Commonly known as: PROVENTIL   Take 0.5 mLs (2.5 mg total) by nebulization every 2 (two) hours as needed for wheezing or shortness of breath.      aspirin EC 81 MG tablet   Take 81 mg by mouth daily. For CVA prophylaxis.      benzonatate 100 MG capsule   Commonly known as: TESSALON   Take 100 mg by mouth 3 (three) times daily. For cough.      clopidogrel 75 MG tablet   Commonly known as: PLAVIX   Take 75 mg by mouth daily with breakfast.      DSS 100 MG Caps   Take 100 mg by mouth daily.      ezetimibe-simvastatin 10-40 MG per tablet   Commonly known as: VYTORIN   Take 1 tablet by mouth at bedtime.      feeding supplement Liqd     Take 30 mLs by mouth 2 (two) times daily with a meal.      insulin aspart 100 UNIT/ML injection   Commonly known as: novoLOG   Inject 0-9 Units into the skin 3 (three) times daily with meals. Correction coverage: Sensitive (thin, NPO, renal)  CBG < 70: implement hypoglycemia protocol  CBG 70 - 120: 0 units  CBG 121 - 150: 1 unit  CBG 151 - 200: 2 units  CBG 201 - 250: 3 units  CBG 251 - 300: 5 units  CBG 301 - 350: 7 units  CBG 351 - 400: 9 units  CBG > 400: call MD      ipratropium 0.02 % nebulizer solution   Commonly known as: ATROVENT   Take 2.5 mLs (0.5 mg total) by nebulization every 4 (four) hours as needed for wheezing.      iron polysaccharides 150 MG capsule   Commonly known as: NIFEREX   Take 1 capsule (150 mg  total) by mouth 2 (two) times daily.      multivitamin with minerals Tabs   Take 1 tablet by mouth daily.      nitroGLYCERIN 0.4 MG/SPRAY spray   Commonly known as: NITROLINGUAL   Place 1 spray under the tongue every 5 (five) minutes as needed. Chest pain. Every 5 minutes up to 3 doses.      ondansetron 4 MG tablet   Commonly known as: ZOFRAN   Take 1 tablet (4 mg total) by mouth every 6 (six) hours as needed for nausea.      oxazepam 15 MG capsule   Commonly known as: SERAX   Take 2 capsules (30 mg total) by mouth at bedtime. For sleep.      oxyCODONE 5 MG immediate release tablet   Commonly known as: Oxy IR/ROXICODONE   Take 1 tablet (5 mg total) by mouth every 6 (six) hours as needed.      sitaGLIPtan-metformin 50-1000 MG per tablet   Commonly known as: JANUMET   Take 1 tablet by mouth 2 (two) times daily with a meal.      Tamsulosin HCl 0.4 MG Caps   Commonly known as: FLOMAX   Take 1 capsule (0.4 mg total) by mouth daily after breakfast.      vitamin C 500 MG tablet   Commonly known as: ASCORBIC ACID   Take 500 mg by mouth 2 (two) times daily.      zinc sulfate 220 MG capsule   Take 220 mg by mouth daily.            Follow-up Information    Follow up with WYATT, Marta Lamas, MD in 2 weeks. (If symptoms worsen,call our office as needed.)    Contact information:   [address] [facility] [place] [address] [phone]       Follow up with Lina Sar, MD. Schedule an appointment as soon as possible for a visit in 4 weeks.   Contact information:   [address] [facility] [address] [place] [address] [place] [address] [phone]       Follow up with Thornton Papas, MD. Schedule an appointment as soon as possible for a visit in 1 month.   Contact information:   [address] [place] [address] [phone]           The results of significant diagnostics from this hospitalization (including imaging, microbiology, ancillary and laboratory) are listed below for reference.    Significant Diagnostic Studies: Ct Abdomen Pelvis W Contrast  03/11/2012  *RADIOLOGY REPORT*  Clinical Data: 71 year old male with left lower quadrant abdominal pain.  History of kidney stones.  CT ABDOMEN AND PELVIS WITH CONTRAST  Technique:  Multidetector CT imaging of the abdomen and pelvis was performed following the standard protocol during bolus administration of intravenous contrast.  Contrast: 80mL OMNIPAQUE IOHEXOL 300 MG/ML  SOLN  Comparison: Abdominal radiographs 03/07/2012.  Findings: Dependent opacity and crowding of markings at the lung bases compatible with atelectasis.  Mild cardiomegaly.  No pericardial or pleural effusion.  Sequelae median sternotomy. Degenerative changes in the spine. No acute osseous abnormality identified.  No pelvic free fluid.  The bladder is distended and yet there is evidence of bladder wall thickening.  No perivesical stranding. The prostate is enlarged and lobulated and there is mild contour deformity at the base of the bladder.  Negative distal colon aside from retained stool.  Oral contrast has  reached the splenic  flexure.  No wall thickening or inflammation of the left colon.  Contrast mixed with stool throughout the more proximal colon.  There is a the crescent-shaped fungating soft tissue mass arising in the posterior wall of the right colon on series 5 image 30 measuring 22 x 46 x 23 mm (AP by transverse by CC, coronal image 45, sagittal image 20).  A conspicuous but small lymph node in the adjacent mesentery measures 5 mm in short axis (series 2 image 52). No mesenteric lymphadenopathy.  This lesion is nonobstructing.  The lesion is only 1 to 2 cm distal to the ileocecal valve which is best demonstrated on coronal image 33. Distal small bowel loops are nondilated.  There may be mild distal small bowel wall thickening not involving the terminal ileum in the right lower quadrant (series 2 image 66) but this may be artifact of peristalsis.  No dilated small bowel.  Negative stomach, duodenum, spleen, pancreas, and adrenal glands.  No liver mass identified.  Dependent cholelithiasis.  No pericholecystic inflammation. Multiple low-density areas in both kidneys probably are benign cyst.  There is a 7-8 mm calculus located in the right renal pelvis but not causing obstruction at this time.  No hydronephrosis or hydroureter.  Mild perinephric stranding may be age related.  No abdominal free fluid or lymphadenopathy.  Atherosclerosis of the abdominal aorta and iliac arteries.  IMPRESSION: 1.  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 22 x 46 x 23 mm soft tissue mass.  Small but conspicuous mesenteric lymph node nearby measuring 5 mm short axis.  No liver or distant metastatic disease identified in the abdomen pelvis. 2.  Kidney stone at the right renal pelvis but without associated obstruction at this time. 3.  The bladder is distended with evidence of wall thickening.  The prostate is enlarged.  Chronic bladder outlet obstruction suspected and urology  follow up to include prostate evaluation recommended. 4.  Cholelithiasis.  Aortic atherosclerosis.  Salient findings discussed by telephone with provider Maren Reamer on 03/11/2012 at  Original Report Authenticated By: Harley Hallmark, M.D.   Nm Myocar Multi W/spect W/wall Motion / Ef  03/17/2012  *RADIOLOGY REPORT*  Clinical Data:  Chest pain  Technique:  Standard myocardial SPECT imaging performed after resting intravenous injection of 10 mCi Tc-66m tetrofosmin . Subsequently, intravenous infusion of Lexiscan performed under the supervision of the Cardiology staff.  At peak effect of the drug, 30 mCi Tc-15mtetrofosmin injected intravenously and standard myocardial SPECT imaging performed.  Quantitative gated imaging also performed to evaluate left ventricular wall motion and estimate left ventricular ejection fraction.  Comparison:  None  MYOCARDIAL IMAGING WITH SPECT (REST AND PHARMACOLOGIC-STRESS)  Findings:  There is a small area of mild reversibility involving the apical segment of the anterior wall.  The summed difference score is equal to one.  This likely reflects nonuniform soft tissue attenuation artifact.  GATED LEFT VENTRICULAR WALL MOTION STUDY  Findings:  Review of the gated images demonstrates moderate hypokinesis involving the mid and apical segments of the anterior septum is noted.  There is mild hypokinesis involving the basilar segments of the lateral wall.  Normal wall thickening.  LEFT VENTRICULAR EJECTION FRACTION  Findings:  QGS ejection fraction measures 61% , with an end- diastolic volume of 84 ml and an end-systolic volume of 33 ml.  IMPRESSION:  1. Small area of mild reversibility involving the apical segment of the  anterior wall.  This has a summed difference score equal to one and likely reflects nonuniform soft tissue attenuation artifact. True myocardial ischemia is considered less favored.  2.  Anterior septal hypokinesis. 3.  Left ventricular ejection fraction equals 61%.  Original  Report Authenticated By: Rosealee Albee, M.D.   Dg Chest Port 1 View  03/23/2012  *RADIOLOGY REPORT*  Clinical Data: Cough.  PORTABLE CHEST - 1 VIEW  Comparison: 01/29/2012.  Findings: Interval borderline enlargement of the cardiac silhouette with increased prominence of the pulmonary vasculature and interstitial markings.  Stable post CABG changes.  Diffuse osteopenia.  IMPRESSION: Interval borderline cardiomegaly and mild changes of congestive heart failure.  Original Report Authenticated By: Darrol Angel, M.D.    Microbiology: No results found for this or any previous visit (from the past 240 hour(s)).   Labs: Basic Metabolic Panel:  Lab 03/28/12 1610 03/27/12 0635 03/26/12 0856 03/25/12 1020 03/25/12 0635 03/24/12 0500  NA 141 140 -- 140 139 141  K 3.3* 3.5 3.6 3.3* 3.1* --  CL 104 103 -- 103 103 108  CO2 26 26 -- 26 26 24   GLUCOSE 145* 137* -- 163* 132* 145*  BUN 11 12 -- 9 9 7   CREATININE 0.77 0.83 -- 0.79 0.76 0.77  CALCIUM 9.3 9.3 -- 9.0 8.8 8.7  MG 1.6 1.7 1.7 1.8 1.6 --  PHOS -- -- -- -- -- --   Liver Function Tests: No results found for this basename: AST:5,ALT:5,ALKPHOS:5,BILITOT:5,PROT:5,ALBUMIN:5 in the last 168 hours No results found for this basename: LIPASE:5,AMYLASE:5 in the last 168 hours No results found for this basename: AMMONIA:5 in the last 168 hours CBC:  Lab 03/28/12 0607 03/26/12 0856 03/25/12 0635 03/24/12 0500 03/23/12 1306 03/23/12 0525 03/22/12 0545  WBC 6.1 -- 7.6 7.3 -- 10.3 5.9  NEUTROABS -- -- -- -- -- -- --  HGB 8.4* 9.0* 8.6* 9.2* 10.0* -- --  HCT 26.5* 28.8* 27.4* 29.1* 31.3* -- --  MCV 78.9 -- 78.1 78.6 -- 77.1* 79.1  PLT 283 -- 246 243 -- 247 235   Cardiac Enzymes: No results found for this basename: CKTOTAL:5,CKMB:5,CKMBINDEX:5,TROPONINI:5 in the last 168 hours BNP: BNP (last 3 results) No results found for this basename: PROBNP:3 in the last 8760 hours CBG:  Lab 03/28/12 1135 03/28/12 0727 03/27/12 2046 03/27/12 1652 03/27/12  1158  GLUCAP 204* 136* 143* 158* 254*    Time coordinating discharge: 40  Signed:  Maanasa Aderhold A  [facility] 03/28/2012, 12:41 PM

## 2012-03-29 ENCOUNTER — Telehealth: Payer: Self-pay | Admitting: Oncology

## 2012-03-29 NOTE — Telephone Encounter (Signed)
hosp f/u info send to scheduling inbox. messae forwared to [employer] in HIM.

## 2012-03-30 ENCOUNTER — Emergency Department (HOSPITAL_COMMUNITY)
Admission: EM | Admit: 2012-03-30 | Discharge: 2012-03-30 | Disposition: A | Discharge status: Skilled Nursing Facility | Payer: Medicare Other | Attending: Emergency Medicine | Admitting: Emergency Medicine

## 2012-03-30 ENCOUNTER — Encounter (HOSPITAL_COMMUNITY): Payer: Self-pay | Admitting: *Deleted

## 2012-03-30 ENCOUNTER — Emergency Department (HOSPITAL_COMMUNITY): Payer: Medicare Other

## 2012-03-30 DIAGNOSIS — J4489 Other specified chronic obstructive pulmonary disease: Secondary | ICD-10-CM | POA: Insufficient documentation

## 2012-03-30 DIAGNOSIS — Z8673 Personal history of transient ischemic attack (TIA), and cerebral infarction without residual deficits: Secondary | ICD-10-CM | POA: Insufficient documentation

## 2012-03-30 DIAGNOSIS — I10 Essential (primary) hypertension: Secondary | ICD-10-CM | POA: Insufficient documentation

## 2012-03-30 DIAGNOSIS — Z79899 Other long term (current) drug therapy: Secondary | ICD-10-CM | POA: Insufficient documentation

## 2012-03-30 DIAGNOSIS — N390 Urinary tract infection, site not specified: Secondary | ICD-10-CM | POA: Insufficient documentation

## 2012-03-30 DIAGNOSIS — E119 Type 2 diabetes mellitus without complications: Secondary | ICD-10-CM | POA: Insufficient documentation

## 2012-03-30 DIAGNOSIS — I252 Old myocardial infarction: Secondary | ICD-10-CM | POA: Insufficient documentation

## 2012-03-30 DIAGNOSIS — Z794 Long term (current) use of insulin: Secondary | ICD-10-CM | POA: Insufficient documentation

## 2012-03-30 DIAGNOSIS — R259 Unspecified abnormal involuntary movements: Secondary | ICD-10-CM | POA: Insufficient documentation

## 2012-03-30 DIAGNOSIS — Z7982 Long term (current) use of aspirin: Secondary | ICD-10-CM | POA: Insufficient documentation

## 2012-03-30 DIAGNOSIS — Z85038 Personal history of other malignant neoplasm of large intestine: Secondary | ICD-10-CM | POA: Insufficient documentation

## 2012-03-30 DIAGNOSIS — R251 Tremor, unspecified: Secondary | ICD-10-CM

## 2012-03-30 DIAGNOSIS — J449 Chronic obstructive pulmonary disease, unspecified: Secondary | ICD-10-CM | POA: Insufficient documentation

## 2012-03-30 HISTORY — DX: Malignant neoplasm of colon, unspecified: C18.9

## 2012-03-30 LAB — CBC
HCT: 31.2 % — ABNORMAL LOW (ref 39.0–52.0)
Hemoglobin: 9.8 g/dL — ABNORMAL LOW (ref 13.0–17.0)
MCH: 25.5 pg — ABNORMAL LOW (ref 26.0–34.0)
MCV: 81.3 fL (ref 78.0–100.0)
RBC: 3.84 MIL/uL — ABNORMAL LOW (ref 4.22–5.81)

## 2012-03-30 LAB — BASIC METABOLIC PANEL
BUN: 23 mg/dL (ref 6–23)
CO2: 25 mEq/L (ref 19–32)
Calcium: 9.9 mg/dL (ref 8.4–10.5)
Glucose, Bld: 145 mg/dL — ABNORMAL HIGH (ref 70–99)
Sodium: 139 mEq/L (ref 135–145)

## 2012-03-30 LAB — TROPONIN I: Troponin I: <0.3 ng/mL (ref <0.30)

## 2012-03-30 LAB — URINALYSIS, ROUTINE W REFLEX MICROSCOPIC
Bilirubin Urine: NEGATIVE
Glucose, UA: NEGATIVE mg/dL
Protein, ur: >300 mg/dL — AB

## 2012-03-30 LAB — URINE MICROSCOPIC-ADD ON

## 2012-03-30 MED ORDER — CEPHALEXIN 500 MG PO CAPS: 500.0000 mg | ORAL_CAPSULE | Freq: Three times a day (TID) | ORAL | Status: AC

## 2012-03-30 MED ORDER — CEPHALEXIN 250 MG PO CAPS
500.0000 mg | ORAL_CAPSULE | Freq: Once | ORAL | Status: AC
  Administered 2012-03-30: 500 mg via ORAL
  Filled 2012-03-30: qty 2

## 2012-03-30 NOTE — ED Notes (Signed)
Spoke with the RN at the SNF in re the patient's diagnosis and discharge status.

## 2012-03-30 NOTE — ED Notes (Signed)
Patient reported to have period of tremors today that has now resolved.  Patient also complains of mid abd  Pain.   He had hemicolectomy on 07/05.  cbg was 160.  Patient is unable to talk.  He is reported to be upset and wants to go home.  Patient is a new resident to [facility] since 07-11.

## 2012-03-30 NOTE — ED Notes (Addendum)
Pt's family reports that they think pt had seizure.  EMS reports that staff at [facility] did not state it was a seizure, but tremors.  All has resolved.

## 2012-03-30 NOTE — ED Notes (Signed)
Per charge RN, Sharin Mons was called by the Diplomatic Services operational officer.

## 2012-03-30 NOTE — ED Notes (Signed)
Pt for discharge.Transported to the nursing facility by abmulance service.Vital signs stable .

## 2012-03-30 NOTE — ED Provider Notes (Signed)
History     CSN: [id number]  Arrival date & time 03/30/12  1626   First MD Initiated Contact with Patient 03/30/12 1700      Chief Complaint  Patient presents with  . Tremors    (Consider location/radiation/quality/duration/timing/severity/associated sxs/prior treatment) The history is provided by the patient and a relative.  pt with episode tremors earlier. Pt w hx prior cva, exp aphasia. Pt difficult historian. States felt shaky all over, esp in legs, unsure how long lasted, denies loc. Family notes was sitting in chair and was shaky all over for a couple minutes. Deny loc, trauma or fall.  Has chronic indwelling foley. No postictal period or altered loc post tremulous episode. No hx seizures. Denies palpitations or fast heart beat. Denies cp, although family notes pt appeared to grab chest during episode. Pt/family deny hx seizures, tremors/shakes, other other movement disorder. Pt denies fever/chills/sweats. Denies headache. No cough or uri c/o. No abd pain. Pt states is eating/drinking, and having regular bms.  Past Medical History  Diagnosis Date  . Myocardial infarction   . CVA (cerebral infarction)   . Diabetes mellitus   . Hypertension   . Diabetes mellitus   . Aphasia   . Dysphagia   . Stroke   . COPD (chronic obstructive pulmonary disease)   . Shortness of breath   . Anemia   . Colon cancer     Past Surgical History  Procedure Date  . Coronary artery bypass graft   . Appendectomy   . Dental surgery   . Colonoscopy 03/13/2012    Procedure: COLONOSCOPY;  Surgeon: Hart Carwin, MD;  Location: [facility];  Service: Endoscopy;  Laterality: N/A;  . Partial colectomy 03/22/2012    Procedure: PARTIAL COLECTOMY;  Surgeon: Cherylynn Ridges, MD;  Location: [facility] OR;  Service: General;  Laterality: Right;    Family History  Problem Relation Age of Onset  . Breast cancer Mother   . Heart disease Brother   . Colon cancer Maternal Uncle     History  Substance Use Topics  .  Smoking status: Former Smoker -- 16 years    Types: Cigarettes    Quit date: 12/27/2011  . Smokeless tobacco: Never Used  . Alcohol Use: No      Review of Systems  Constitutional: Negative for fever and chills.  HENT: Negative for neck pain.   Eyes: Negative for visual disturbance.  Respiratory: Negative for cough and shortness of breath.   Cardiovascular: Negative for chest pain and palpitations.  Gastrointestinal: Negative for abdominal pain.  Genitourinary: Negative for dysuria and flank pain.  Musculoskeletal: Negative for back pain.  Skin: Negative for rash.  Neurological: Negative for numbness and headaches.  Hematological: Does not bruise/bleed easily.    Allergies  Review of patient's allergies indicates no known allergies.  Home Medications   Current Outpatient Rx  Name Route Sig Dispense Refill  . ALBUTEROL SULFATE (5 MG/ML) 0.5% IN NEBU Nebulization Take 0.5 mLs (2.5 mg total) by nebulization every 2 (two) hours as needed for wheezing or shortness of breath. 20 mL 0  . ASPIRIN EC 81 MG PO TBEC Oral Take 81 mg by mouth daily. For CVA prophylaxis.    Marland Kitchen BENZONATATE 100 MG PO CAPS Oral Take 100 mg by mouth 3 (three) times daily. For cough.    . CLOPIDOGREL BISULFATE 75 MG PO TABS Oral Take 75 mg by mouth daily with breakfast.    . DSS 100 MG PO CAPS Oral Take 100 mg  by mouth daily. 10 capsule   . EZETIMIBE-SIMVASTATIN 10-40 MG PO TABS Oral Take 1 tablet by mouth at bedtime.    . INSULIN ASPART 100 UNIT/ML [place] SOLN Subcutaneous Inject 0-9 Units into the skin 3 (three) times daily with meals. Correction coverage: Sensitive (thin, NPO, renal) CBG < 70: implement hypoglycemia protocol CBG 70 - 120: 0 units CBG 121 - 150: 1 unit CBG 151 - 200: 2 units CBG 201 - 250: 3 units CBG 251 - 300: 5 units CBG 301 - 350: 7 units CBG 351 - 400: 9 units CBG > 400: call MD 1 vial   . IPRATROPIUM BROMIDE 0.02 % IN SOLN Nebulization Take 2.5 mLs (0.5 mg total) by nebulization every 4  (four) hours as needed for wheezing. 75 mL   . POLYSACCHARIDE IRON COMPLEX 150 MG PO CAPS Oral Take 1 capsule (150 mg total) by mouth 2 (two) times daily.    . ADULT MULTIVITAMIN W/MINERALS CH Oral Take 1 tablet by mouth daily.    Marland Kitchen ONDANSETRON HCL 4 MG PO TABS Oral Take 1 tablet (4 mg total) by mouth every 6 (six) hours as needed for nausea. 20 tablet   . OXAZEPAM 15 MG PO CAPS Oral Take 2 capsules (30 mg total) by mouth at bedtime. For sleep. 5 capsule 0  . OXYCODONE HCL 5 MG PO TABS Oral Take 1 tablet (5 mg total) by mouth every 6 (six) hours as needed. 10 tablet 0  . SITAGLIPTIN-METFORMIN HCL 50-1000 MG PO TABS Oral Take 1 tablet by mouth 2 (two) times daily with a meal.    . TAMSULOSIN HCL 0.4 MG PO CAPS Oral Take 1 capsule (0.4 mg total) by mouth daily after breakfast. 30 capsule   . VITAMIN C 500 MG PO TABS Oral Take 500 mg by mouth 2 (two) times daily.    Marland Kitchen ZINC SULFATE 220 MG PO CAPS Oral Take 220 mg by mouth daily.    Marland Kitchen NITROGLYCERIN 0.4 MG/SPRAY TL SOLN Sublingual Place 1 spray under the tongue every 5 (five) minutes as needed. Chest pain. Every 5 minutes up to 3 doses.      BP 97/56  Pulse 78  Temp 97.8 F (36.6 C) (Oral)  Resp 20  SpO2 99%  Physical Exam  Nursing note and vitals reviewed. Constitutional: He appears well-developed and well-nourished. No distress.  HENT:  Head: Atraumatic.  Nose: Nose normal.  Mouth/Throat: Oropharynx is clear and moist.  Eyes: Conjunctivae and EOM are normal. Pupils are equal, round, and reactive to light.  Neck: Normal range of motion. Neck supple. No tracheal deviation present. No thyromegaly present.       No bruit. No stiffness or rigidity  Cardiovascular: Normal rate, regular rhythm, normal heart sounds and intact distal pulses.   Pulmonary/Chest: Effort normal and breath sounds normal. No accessory muscle usage. No respiratory distress.  Abdominal: Soft. Bowel sounds are normal. He exhibits no distension. There is no tenderness.        Healing surgical scar, staples intact, no sign of infection.   Genitourinary:       No cva tenderness  Musculoskeletal: Normal range of motion. He exhibits no edema and no tenderness.  Neurological: He is alert. No cranial nerve deficit.       Oriented to person/place. Motor intact bil. No tremor, shakes, or other involuntary movement noted.   Skin: Skin is warm and dry.    ED Course  Procedures (including critical care time)   Labs Reviewed  CBC  BASIC METABOLIC PANEL  URINALYSIS, ROUTINE W REFLEX MICROSCOPIC  TROPONIN I   Results for orders placed during the hospital encounter of 03/30/12  CBC      Component Value Range   WBC 7.5  4.0 - 10.5 K/uL   RBC 3.84 (*) 4.22 - 5.81 MIL/uL   Hemoglobin 9.8 (*) 13.0 - 17.0 g/dL   HCT 16.1 (*) 09.6 - 04.5 %   MCV 81.3  78.0 - 100.0 fL   MCH 25.5 (*) 26.0 - 34.0 pg   MCHC 31.4  30.0 - 36.0 g/dL   RDW 40.9 (*) 81.1 - 91.4 %   Platelets 316  150 - 400 K/uL  BASIC METABOLIC PANEL      Component Value Range   Sodium 139  135 - 145 mEq/L   Potassium 4.1  3.5 - 5.1 mEq/L   Chloride 101  96 - 112 mEq/L   CO2 25  19 - 32 mEq/L   Glucose, Bld 145 (*) 70 - 99 mg/dL   BUN 23  6 - 23 mg/dL   Creatinine, Ser 7.82  0.50 - 1.35 mg/dL   Calcium 9.9  8.4 - 95.6 mg/dL   GFR calc non Af Amer 83 (*) >90 mL/min   GFR calc Af Amer >90  >90 mL/min  URINALYSIS, ROUTINE W REFLEX MICROSCOPIC      Component Value Range   Color, Urine AMBER (*) YELLOW   APPearance TURBID (*) CLEAR   Specific Gravity, Urine 1.029  1.005 - 1.030   pH 5.0  5.0 - 8.0   Glucose, UA NEGATIVE  NEGATIVE mg/dL   Hgb urine dipstick LARGE (*) NEGATIVE   Bilirubin Urine NEGATIVE  NEGATIVE   Ketones, ur NEGATIVE  NEGATIVE mg/dL   Protein, ur >213 (*) NEGATIVE mg/dL   Urobilinogen, UA 0.2  0.0 - 1.0 mg/dL   Nitrite NEGATIVE  NEGATIVE   Leukocytes, UA SMALL (*) NEGATIVE  TROPONIN I      Component Value Range   Troponin I <0.30  <0.30 ng/mL  URINE MICROSCOPIC-ADD ON       Component Value Range   Squamous Epithelial / LPF RARE  RARE   WBC, UA 3-6  <3 WBC/hpf   RBC / HPF TOO NUMEROUS TO COUNT  <3 RBC/hpf   Bacteria, UA RARE  RARE   Casts HYALINE CASTS (*) NEGATIVE   Crystals CA OXALATE CRYSTALS (*) NEGATIVE   Urine-Other MUCOUS PRESENT     Dg Chest 2 View  03/30/2012  *RADIOLOGY REPORT*  Clinical Data: Acute stroke.  Right-sided weakness.  Coronary artery disease.  CHEST - 2 VIEW  Comparison: 03/23/2012  Findings: Improved aeration of both lungs is seen.  Both lungs are clear.  Heart size is normal.  Prior CABG again noted.  IMPRESSION: No active disease.  Original Report Authenticated By: Danae Orleans, M.D.      MDM  Labs.    Date: 03/30/2012  Rate: 69  Rhythm: normal sinus rhythm  QRS Axis: normal  Intervals: normal  ST/T Wave abnormalities: nonspecific ST/T changes  Conduction Disutrbances:none  Narrative Interpretation:   Old EKG Reviewed: changes noted  Recheck, pt awake, alert, denies c/o. No tremor or shakes. Denies fever or chills. Repeat vitals normal, no fevers in ed. pts mental status at baseline.  ?possible uti on cath ua w tntc rbc, few wbc, le pos, will culture and rx.  Po fluids.   Pt tolerating fluids well. Denies any c/o. No pain. No fever/chills. No tremor/shakes.  Suzi Roots, MD 03/30/12 2023

## 2012-04-01 LAB — URINE CULTURE
Colony Count: NO GROWTH
Culture: NO GROWTH

## 2012-04-02 ENCOUNTER — Telehealth: Payer: Self-pay | Admitting: *Deleted

## 2012-04-02 NOTE — Telephone Encounter (Signed)
patient confirmed over the phone the new date and time of the hospital follow up on 05-06-2012 at 8:00am

## 2012-04-03 ENCOUNTER — Telehealth: Payer: Self-pay | Admitting: Oncology

## 2012-04-03 NOTE — Telephone Encounter (Signed)
Hosp F/U Dx- Colon Ca

## 2012-04-03 NOTE — Telephone Encounter (Signed)
New Pt package mailed out.  °

## 2012-04-03 NOTE — Telephone Encounter (Signed)
New Pt, package sent out.

## 2012-05-06 ENCOUNTER — Ambulatory Visit: Payer: Medicare Other

## 2012-05-06 ENCOUNTER — Ambulatory Visit: Payer: Medicare Other | Admitting: Oncology

## 2012-05-13 ENCOUNTER — Inpatient Hospital Stay (HOSPITAL_COMMUNITY)
Admission: AD | Admit: 2012-05-13 | Discharge: 2012-05-15 | DRG: 247 | Disposition: A | Discharge status: Home / Self Care | Payer: Medicare Other | Source: Ambulatory Visit | Attending: Cardiology | Admitting: Cardiology

## 2012-05-13 DIAGNOSIS — I2510 Atherosclerotic heart disease of native coronary artery without angina pectoris: Principal | ICD-10-CM | POA: Diagnosis present

## 2012-05-13 DIAGNOSIS — J4489 Other specified chronic obstructive pulmonary disease: Secondary | ICD-10-CM | POA: Diagnosis present

## 2012-05-13 DIAGNOSIS — I252 Old myocardial infarction: Secondary | ICD-10-CM

## 2012-05-13 DIAGNOSIS — Z87891 Personal history of nicotine dependence: Secondary | ICD-10-CM

## 2012-05-13 DIAGNOSIS — Z85038 Personal history of other malignant neoplasm of large intestine: Secondary | ICD-10-CM

## 2012-05-13 DIAGNOSIS — I69920 Aphasia following unspecified cerebrovascular disease: Secondary | ICD-10-CM

## 2012-05-13 DIAGNOSIS — Z951 Presence of aortocoronary bypass graft: Secondary | ICD-10-CM

## 2012-05-13 DIAGNOSIS — J449 Chronic obstructive pulmonary disease, unspecified: Secondary | ICD-10-CM | POA: Diagnosis present

## 2012-05-13 DIAGNOSIS — Z794 Long term (current) use of insulin: Secondary | ICD-10-CM

## 2012-05-13 LAB — APTT: aPTT: 33 seconds (ref 24–37)

## 2012-05-13 LAB — CBC WITH DIFFERENTIAL/PLATELET
Basophils Absolute: 0 10*3/uL (ref 0.0–0.1)
Basophils Relative: 0 % (ref 0–1)
HCT: 34.5 % — ABNORMAL LOW (ref 39.0–52.0)
Hemoglobin: 11.3 g/dL — ABNORMAL LOW (ref 13.0–17.0)
Lymphocytes Relative: 23 % (ref 12–46)
MCHC: 32.8 g/dL (ref 30.0–36.0)
Monocytes Absolute: 0.6 10*3/uL (ref 0.1–1.0)
Monocytes Relative: 8 % (ref 3–12)
Neutro Abs: 4.7 10*3/uL (ref 1.7–7.7)
Neutrophils Relative %: 65 % (ref 43–77)
WBC: 7.2 10*3/uL (ref 4.0–10.5)

## 2012-05-13 LAB — PROTIME-INR: INR: 1.02 (ref 0.00–1.49)

## 2012-05-13 LAB — GLUCOSE, CAPILLARY: Glucose-Capillary: 126 mg/dL — ABNORMAL HIGH (ref 70–99)

## 2012-05-13 LAB — CARDIAC PANEL(CRET KIN+CKTOT+MB+TROPI)
Relative Index: INVALID (ref 0.0–2.5)
Troponin I: <0.3 ng/mL (ref <0.30)

## 2012-05-13 LAB — COMPREHENSIVE METABOLIC PANEL
ALT: 15 U/L (ref 0–53)
Albumin: 3.4 g/dL — ABNORMAL LOW (ref 3.5–5.2)
Alkaline Phosphatase: 66 U/L (ref 39–117)
Chloride: 105 mEq/L (ref 96–112)
Glucose, Bld: 110 mg/dL — ABNORMAL HIGH (ref 70–99)
Potassium: 4.2 mEq/L (ref 3.5–5.1)
Sodium: 140 mEq/L (ref 135–145)
Total Bilirubin: 0.2 mg/dL — ABNORMAL LOW (ref 0.3–1.2)
Total Protein: 6.5 g/dL (ref 6.0–8.3)

## 2012-05-13 MED ORDER — DIAZEPAM 5 MG PO TABS
5.0000 mg | ORAL_TABLET | ORAL | Status: AC
  Administered 2012-05-14: 5 mg via ORAL
  Filled 2012-05-13: qty 1

## 2012-05-13 MED ORDER — ASPIRIN 81 MG PO CHEW
324.0000 mg | CHEWABLE_TABLET | ORAL | Status: AC
  Administered 2012-05-13: 324 mg via ORAL
  Filled 2012-05-13: qty 4

## 2012-05-13 MED ORDER — TAMSULOSIN HCL 0.4 MG PO CAPS
0.4000 mg | ORAL_CAPSULE | Freq: Every day | ORAL | Status: DC
  Administered 2012-05-14 – 2012-05-15 (×2): 0.4 mg via ORAL
  Filled 2012-05-13 (×3): qty 1

## 2012-05-13 MED ORDER — ACETAMINOPHEN 325 MG PO TABS: 650.0000 mg | ORAL_TABLET | ORAL | Status: DC | PRN

## 2012-05-13 MED ORDER — SODIUM CHLORIDE 0.9 % IV SOLN
INTRAVENOUS | Status: DC
  Administered 2012-05-13: 20:00:00 via INTRAVENOUS

## 2012-05-13 MED ORDER — NITROGLYCERIN 0.4 MG SL SUBL: 0.4000 mg | SUBLINGUAL_TABLET | SUBLINGUAL | Status: DC | PRN

## 2012-05-13 MED ORDER — ASPIRIN 300 MG RE SUPP
300.0000 mg | RECTAL | Status: AC
  Filled 2012-05-13: qty 1

## 2012-05-13 MED ORDER — SODIUM CHLORIDE 0.9 % IJ SOLN
3.0000 mL | Freq: Two times a day (BID) | INTRAMUSCULAR | Status: DC
  Administered 2012-05-14: 3 mL via INTRAVENOUS

## 2012-05-13 MED ORDER — ASPIRIN EC 81 MG PO TBEC: 81.0000 mg | DELAYED_RELEASE_TABLET | Freq: Every day | ORAL | Status: DC

## 2012-05-13 MED ORDER — SODIUM CHLORIDE 0.9 % IV SOLN: 250.0000 mL | INTRAVENOUS | Status: DC | PRN

## 2012-05-13 MED ORDER — EZETIMIBE-SIMVASTATIN 10-40 MG PO TABS
1.0000 | ORAL_TABLET | Freq: Every day | ORAL | Status: DC
  Administered 2012-05-13: 1 via ORAL
  Filled 2012-05-13 (×4): qty 1

## 2012-05-13 MED ORDER — INSULIN ASPART 100 UNIT/ML ~~LOC~~ SOLN
0.0000 [IU] | Freq: Three times a day (TID) | SUBCUTANEOUS | Status: DC
  Administered 2012-05-15: 2 [IU] via SUBCUTANEOUS

## 2012-05-13 MED ORDER — POLYSACCHARIDE IRON COMPLEX 150 MG PO CAPS
150.0000 mg | ORAL_CAPSULE | Freq: Two times a day (BID) | ORAL | Status: DC
  Administered 2012-05-13 – 2012-05-15 (×2): 150 mg via ORAL
  Filled 2012-05-13 (×6): qty 1

## 2012-05-13 MED ORDER — METOPROLOL TARTRATE 12.5 MG HALF TABLET
12.5000 mg | ORAL_TABLET | Freq: Two times a day (BID) | ORAL | Status: DC
  Administered 2012-05-13 – 2012-05-15 (×3): 12.5 mg via ORAL
  Filled 2012-05-13 (×6): qty 1

## 2012-05-13 MED ORDER — ADULT MULTIVITAMIN W/MINERALS CH
1.0000 | ORAL_TABLET | Freq: Every day | ORAL | Status: DC
  Administered 2012-05-15: 10:00:00 1 via ORAL
  Filled 2012-05-13 (×2): qty 1

## 2012-05-13 MED ORDER — NITROGLYCERIN IN D5W 200-5 MCG/ML-% IV SOLN
5.0000 ug/min | INTRAVENOUS | Status: DC
  Administered 2012-05-13: 5 ug/min via INTRAVENOUS
  Filled 2012-05-13: qty 250

## 2012-05-13 MED ORDER — HEPARIN BOLUS VIA INFUSION
3500.0000 [IU] | Freq: Once | INTRAVENOUS | Status: AC
  Administered 2012-05-13: 3500 [IU] via INTRAVENOUS
  Filled 2012-05-13: qty 3500

## 2012-05-13 MED ORDER — CLOPIDOGREL BISULFATE 75 MG PO TABS
75.0000 mg | ORAL_TABLET | Freq: Every day | ORAL | Status: DC
  Administered 2012-05-14: 75 mg via ORAL

## 2012-05-13 MED ORDER — SODIUM CHLORIDE 0.9 % IV SOLN
INTRAVENOUS | Status: DC
  Administered 2012-05-13 – 2012-05-14 (×2): via INTRAVENOUS

## 2012-05-13 MED ORDER — CLOPIDOGREL BISULFATE 75 MG PO TABS
75.0000 mg | ORAL_TABLET | Freq: Every day | ORAL | Status: DC
  Filled 2012-05-13: qty 1

## 2012-05-13 MED ORDER — NITROGLYCERIN 0.4 MG/SPRAY TL SOLN: 1.0000 | Status: DC | PRN

## 2012-05-13 MED ORDER — ASPIRIN 81 MG PO CHEW
324.0000 mg | CHEWABLE_TABLET | ORAL | Status: AC
  Administered 2012-05-14: 324 mg via ORAL
  Filled 2012-05-13: qty 4

## 2012-05-13 MED ORDER — SODIUM CHLORIDE 0.9 % IJ SOLN: 3.0000 mL | INTRAMUSCULAR | Status: DC | PRN

## 2012-05-13 MED ORDER — HEPARIN (PORCINE) IN NACL 100-0.45 UNIT/ML-% IJ SOLN
1050.0000 [IU]/h | INTRAMUSCULAR | Status: DC
  Administered 2012-05-13: 850 [IU]/h via INTRAVENOUS
  Filled 2012-05-13 (×2): qty 250

## 2012-05-13 MED ORDER — ONDANSETRON HCL 4 MG/2ML IJ SOLN: 4.0000 mg | Freq: Four times a day (QID) | INTRAMUSCULAR | Status: DC | PRN

## 2012-05-13 NOTE — Progress Notes (Signed)
ANTICOAGULATION CONSULT NOTE - Initial Consult  Pharmacy Consult for heparin Indication: chest pain/ACS  No Known Allergies  Patient Measurements: Height: 5\' 4"  (162.6 cm) Weight: 126 lb 8.7 oz (57.4 kg) IBW/kg (Calculated) : 59.2  Heparin Dosing Weight: 57  Vital Signs: Temp: 98.2 F (36.8 C) (08/26 1550) Temp src: Oral (08/26 1550) BP: 161/74 mmHg (08/26 1550) Pulse Rate: 76  (08/26 1550)  Labs: No results found for this basename: HGB:2,HCT:3,PLT:3,APTT:3,LABPROT:3,INR:3,HEPARINUNFRC:3,CREATININE:3,CKTOTAL:3,CKMB:3,TROPONINI:3 in the last 72 hours  Estimated Creatinine Clearance: 59.4 ml/min (by C-G formula based on Cr of 0.94).   Medical History: Past Medical History  Diagnosis Date  . Myocardial infarction   . CVA (cerebral infarction)   . Diabetes mellitus   . Hypertension   . Diabetes mellitus   . Aphasia   . Dysphagia   . Stroke   . COPD (chronic obstructive pulmonary disease)   . Shortness of breath   . Anemia   . Colon cancer     Medications:  Scheduled:    . aspirin  324 mg Oral NOW   Or  . aspirin  300 mg Rectal NOW  . aspirin  324 mg Oral Pre-Cath  . aspirin EC  81 mg Oral Daily  . aspirin EC  81 mg Oral Daily  . clopidogrel  75 mg Oral Q breakfast  . clopidogrel  75 mg Oral Q breakfast  . diazepam  5 mg Oral On Call  . ezetimibe-simvastatin  1 tablet Oral QHS  . insulin aspart  0-9 Units Subcutaneous TID WC  . iron polysaccharides  150 mg Oral BID  . metoprolol tartrate  12.5 mg Oral BID  . multivitamin with minerals  1 tablet Oral Daily  . sodium chloride  3 mL Intravenous Q12H  . Tamsulosin HCl  0.4 mg Oral QPC breakfast    Assessment: 71 yo who has an extensive cardiac hx. He was admitted today for CP. IV heparin has been ordered to r/o MI. Plan for cath in AM. He had an hemicolectomy done back on 7/5. He did have some anemia due to surgery. Last recorded hgb was 9.8 on 7/13.  Goal of Therapy:  Heparin level 0.3-0.7 units/ml Monitor  platelets by anticoagulation protocol: Yes   Plan:  1. Heparin bolus 3500 units/hr 2. Heparin drip at 850 units/hr 3. F/u with 6 hr heparin level  Ulyses Southward [place] 05/13/2012,6:40 PM

## 2012-05-13 NOTE — H&P (Signed)
[name] is an 71 y.o. male.   Chief Complaint: Chest pain HPI: Patient is 71 year old male with past medical history significant for multiple medical problems i.e. coronary artery disease history of MI in the past status post CABG multivessel coronary artery disease, hypertension, diabetes matters, history of left CVA with) with expressive aphasia, COPD, history of right colonic had no CA out: Status post right hemicolectomy COPD came to office complaining off recurrent retrosternal chest pain described as pressure off and on since yesterday relieved with sublingual nitroglycerin. Patient denies any nausea vomiting diaphoresis denies shortness of breath. Denies any PND orthopnea leg swelling denies palpitation lightheadedness or syncope patient had the stress test in July of 2013 which was mildly positive for ischemia although patient had prolonged ST depression during the stress portion  in anterolateral leads. Patient denies any relation of chest pain to food breathing or movement.  Past Medical History  Diagnosis Date  . Myocardial infarction   . CVA (cerebral infarction)   . Diabetes mellitus   . Hypertension   . Diabetes mellitus   . Aphasia   . Dysphagia   . Stroke   . COPD (chronic obstructive pulmonary disease)   . Shortness of breath   . Anemia   . Colon cancer     Past Surgical History  Procedure Date  . Coronary artery bypass graft   . Appendectomy   . Dental surgery   . Colonoscopy 03/13/2012    Procedure: COLONOSCOPY;  Surgeon: Hart Carwin, MD;  Location: [facility];  Service: Endoscopy;  Laterality: N/A;  . Partial colectomy 03/22/2012    Procedure: PARTIAL COLECTOMY;  Surgeon: Cherylynn Ridges, MD;  Location: [facility] OR;  Service: General;  Laterality: Right;    Family History  Problem Relation Age of Onset  . Breast cancer Mother   . Heart disease Brother   . Colon cancer Maternal Uncle    Social History:  reports that he quit smoking about 4 months ago. His  smoking use included Cigarettes. He quit after 16 years of use. He has never used smokeless tobacco. He reports that he does not drink alcohol or use illicit drugs.  Allergies: No Known Allergies  Medications Prior to Admission  Medication Sig Dispense Refill  . albuterol (PROVENTIL) (5 MG/ML) 0.5% nebulizer solution Take 2.5 mg by nebulization every 2 (two) hours as needed. For wheeze      . insulin aspart (NOVOLOG) 100 UNIT/ML injection Inject 0-9 Units into the skin 3 (three) times daily before meals.      Marland Kitchen ipratropium (ATROVENT) 0.02 % nebulizer solution Take 500 mcg by nebulization every 4 (four) hours as needed. For wheeze      . iron polysaccharides (NIFEREX) 150 MG capsule Take 150 mg by mouth 2 (two) times daily.      Marland Kitchen oxazepam (SERAX) 15 MG capsule Take 15 mg by mouth at bedtime.      . Tamsulosin HCl (FLOMAX) 0.4 MG CAPS Take 0.4 mg by mouth daily after breakfast.      . aspirin EC 81 MG tablet Take 81 mg by mouth daily. For CVA prophylaxis.      Marland Kitchen benzonatate (TESSALON) 100 MG capsule Take 100 mg by mouth 3 (three) times daily. For cough.      . clopidogrel (PLAVIX) 75 MG tablet Take 75 mg by mouth daily with breakfast.      . ezetimibe-simvastatin (VYTORIN) 10-40 MG per tablet Take 1 tablet by mouth at bedtime.      Marland Kitchen  Multiple Vitamin (MULITIVITAMIN WITH MINERALS) TABS Take 1 tablet by mouth daily.      . nitroGLYCERIN (NITROLINGUAL) 0.4 MG/SPRAY spray Place 1 spray under the tongue every 5 (five) minutes as needed. Chest pain. Every 5 minutes up to 3 doses.      . sitaGLIPtan-metformin (JANUMET) 50-1000 MG per tablet Take 1 tablet by mouth 2 (two) times daily with a meal.      . vitamin C (ASCORBIC ACID) 500 MG tablet Take 500 mg by mouth 2 (two) times daily.      Marland Kitchen zinc sulfate 220 MG capsule Take 220 mg by mouth daily.        Results for orders placed during the hospital encounter of 05/13/12 (from the past 48 hour(s))  GLUCOSE, CAPILLARY     Status: Abnormal   Collection  Time   05/13/12  4:37 PM      Component Value Range Comment   Glucose-Capillary 126 (*) 70 - 99 mg/dL    Comment 1 Documented in Chart      Comment 2 Notify RN      No results found.  Review of Systems  Constitutional: Negative for fever and chills.  Eyes: Positive for blurred vision. Negative for double vision.  Respiratory: Negative for cough.   Cardiovascular: Positive for chest pain. Negative for claudication and leg swelling.  Gastrointestinal: Negative for nausea, vomiting and abdominal pain.  Neurological: Negative for dizziness and headaches.    Blood pressure 161/74, pulse 76, temperature 98.2 F (36.8 C), temperature source Oral, resp. rate 18, height 5\' 4"  (1.626 m), weight 57.4 kg (126 lb 8.7 oz), SpO2 98.00%. Physical Exam  HENT:  Head: Normocephalic and atraumatic.  Mouth/Throat: No oropharyngeal exudate.  Eyes: Conjunctivae and EOM are normal. Pupils are equal, round, and reactive to light. Left eye exhibits no discharge. No scleral icterus.  Neck: Normal range of motion. Neck supple. No JVD present. No tracheal deviation present. No thyromegaly present.  Cardiovascular: Normal rate and regular rhythm.  Exam reveals no friction rub.   Respiratory: Breath sounds normal. No respiratory distress. He has no wheezes. He has no rales.  GI: Soft. Bowel sounds are normal. He exhibits no distension. There is no tenderness. There is no rebound.  Musculoskeletal: He exhibits no edema and no tenderness.  Neurological: He is alert.     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 death MI stroke need for emergency CABG local last complications etc. and consented for PCI.  Robynn Pane 05/13/2012, 5:26 PM

## 2012-05-14 ENCOUNTER — Encounter (HOSPITAL_COMMUNITY)
Admission: AD | Disposition: A | Discharge status: Home / Self Care | Payer: Self-pay | Source: Ambulatory Visit | Attending: Cardiology

## 2012-05-14 HISTORY — PX: PERCUTANEOUS CORONARY STENT INTERVENTION (PCI-S): SHX5485

## 2012-05-14 LAB — LIPID PANEL
Cholesterol: 140 mg/dL (ref 0–200)
HDL: 60 mg/dL (ref >39)
Total CHOL/HDL Ratio: 2.3 RATIO
Triglycerides: 143 mg/dL (ref <150)

## 2012-05-14 LAB — CBC
HCT: 32.8 % — ABNORMAL LOW (ref 39.0–52.0)
Hemoglobin: 11 g/dL — ABNORMAL LOW (ref 13.0–17.0)
MCH: 25.9 pg — ABNORMAL LOW (ref 26.0–34.0)
MCV: 77.2 fL — ABNORMAL LOW (ref 78.0–100.0)
RBC: 4.25 MIL/uL (ref 4.22–5.81)
WBC: 7.4 10*3/uL (ref 4.0–10.5)

## 2012-05-14 LAB — BASIC METABOLIC PANEL
BUN: 23 mg/dL (ref 6–23)
CO2: 27 mEq/L (ref 19–32)
Chloride: 103 mEq/L (ref 96–112)
Creatinine, Ser: 1.07 mg/dL (ref 0.50–1.35)
Glucose, Bld: 105 mg/dL — ABNORMAL HIGH (ref 70–99)

## 2012-05-14 LAB — HEPARIN LEVEL (UNFRACTIONATED): Heparin Unfractionated: <0.1 IU/mL — ABNORMAL LOW (ref 0.30–0.70)

## 2012-05-14 LAB — CARDIAC PANEL(CRET KIN+CKTOT+MB+TROPI)
CK, MB: 1.9 ng/mL (ref 0.3–4.0)
Relative Index: INVALID (ref 0.0–2.5)
Total CK: 25 U/L (ref 7–232)
Troponin I: <0.3 ng/mL (ref <0.30)
Troponin I: <0.3 ng/mL (ref <0.30)

## 2012-05-14 LAB — GLUCOSE, CAPILLARY: Glucose-Capillary: 152 mg/dL — ABNORMAL HIGH (ref 70–99)

## 2012-05-14 LAB — POCT ACTIVATED CLOTTING TIME: Activated Clotting Time: 369 seconds

## 2012-05-14 SURGERY — PERCUTANEOUS CORONARY STENT INTERVENTION (PCI-S)
Anesthesia: LOCAL

## 2012-05-14 MED ORDER — LIDOCAINE HCL (PF) 1 % IJ SOLN
INTRAMUSCULAR | Status: AC
  Filled 2012-05-14: qty 30

## 2012-05-14 MED ORDER — BIVALIRUDIN 250 MG IV SOLR
INTRAVENOUS | Status: AC
  Filled 2012-05-14: qty 250

## 2012-05-14 MED ORDER — NITROGLYCERIN IN D5W 200-5 MCG/ML-% IV SOLN: 5.0000 ug/min | INTRAVENOUS | Status: DC

## 2012-05-14 MED ORDER — HEPARIN (PORCINE) IN NACL 100-0.45 UNIT/ML-% IJ SOLN
1250.0000 [IU]/h | INTRAMUSCULAR | Status: DC
  Administered 2012-05-14: 1250 [IU]/h via INTRAVENOUS
  Filled 2012-05-14 (×2): qty 250

## 2012-05-14 MED ORDER — MIDAZOLAM HCL 2 MG/2ML IJ SOLN
INTRAMUSCULAR | Status: AC
  Filled 2012-05-14: qty 2

## 2012-05-14 MED ORDER — ASPIRIN 81 MG PO CHEW
81.0000 mg | CHEWABLE_TABLET | Freq: Every day | ORAL | Status: DC
  Administered 2012-05-15: 81 mg via ORAL
  Filled 2012-05-14: qty 1

## 2012-05-14 MED ORDER — FENTANYL CITRATE 0.05 MG/ML IJ SOLN
INTRAMUSCULAR | Status: AC
  Filled 2012-05-14: qty 2

## 2012-05-14 MED ORDER — NITROGLYCERIN 0.2 MG/ML ON CALL CATH LAB
INTRAVENOUS | Status: AC
  Filled 2012-05-14: qty 1

## 2012-05-14 MED ORDER — HEPARIN (PORCINE) IN NACL 2-0.9 UNIT/ML-% IJ SOLN
INTRAMUSCULAR | Status: AC
  Filled 2012-05-14: qty 2000

## 2012-05-14 MED ORDER — CLOPIDOGREL BISULFATE 75 MG PO TABS
75.0000 mg | ORAL_TABLET | Freq: Every day | ORAL | Status: DC
  Administered 2012-05-15: 75 mg via ORAL
  Filled 2012-05-14 (×2): qty 1

## 2012-05-14 MED ORDER — ACETAMINOPHEN 325 MG PO TABS: 650.0000 mg | ORAL_TABLET | ORAL | Status: DC | PRN

## 2012-05-14 MED ORDER — HEPARIN BOLUS VIA INFUSION
2000.0000 [IU] | Freq: Once | INTRAVENOUS | Status: AC
  Administered 2012-05-14: 2000 [IU] via INTRAVENOUS
  Filled 2012-05-14: qty 2000

## 2012-05-14 MED ORDER — SODIUM CHLORIDE 0.9 % IV SOLN
INTRAVENOUS | Status: AC
  Administered 2012-05-14 – 2012-05-15 (×2): via INTRAVENOUS

## 2012-05-14 MED ORDER — ONDANSETRON HCL 4 MG/2ML IJ SOLN: 4.0000 mg | Freq: Four times a day (QID) | INTRAMUSCULAR | Status: DC | PRN

## 2012-05-14 MED ORDER — CLOPIDOGREL BISULFATE 300 MG PO TABS
ORAL_TABLET | ORAL | Status: AC
  Administered 2012-05-15: 75 mg via ORAL
  Filled 2012-05-14: qty 1

## 2012-05-14 MED ORDER — FAMOTIDINE 20 MG PO TABS
20.0000 mg | ORAL_TABLET | Freq: Two times a day (BID) | ORAL | Status: DC
  Administered 2012-05-15: 10:00:00 20 mg via ORAL
  Filled 2012-05-14 (×4): qty 1

## 2012-05-14 NOTE — Progress Notes (Signed)
ANTICOAGULATION CONSULT NOTE - Follow Up Consult  Pharmacy Consult for heparin Indication: chest pain/ACS  No Known Allergies  Patient Measurements: Height: 5\' 4"  (162.6 cm) Weight: 126 lb 8.7 oz (57.4 kg) IBW/kg (Calculated) : 59.2  Heparin Dosing Weight: 57  Vital Signs: Temp: 97.8 F (36.6 C) (08/26 2100) Temp src: Oral (08/26 2100) BP: 107/73 mmHg (08/26 2100) Pulse Rate: 86  (08/26 2100)  Labs:  Basename 05/14/12 0025 05/13/12 1834  HGB 11.0* 11.3*  HCT 32.8* 34.5*  PLT 143* 145*  APTT -- 33  LABPROT -- 13.6  INR -- 1.02  HEPARINUNFRC <0.10* --  CREATININE 1.07 0.85  CKTOTAL 25 24  CKMB 1.9 1.8  TROPONINI <0.30 <0.30    Estimated Creatinine Clearance: 52.2 ml/min (by C-G formula based on Cr of 1.07).   Medical History: Past Medical History  Diagnosis Date  . Myocardial infarction   . CVA (cerebral infarction)   . Diabetes mellitus   . Hypertension   . Diabetes mellitus   . Aphasia   . Dysphagia   . Stroke   . COPD (chronic obstructive pulmonary disease)   . Shortness of breath   . Anemia   . Colon cancer     Medications:  Scheduled:     . aspirin  324 mg Oral NOW   Or  . aspirin  300 mg Rectal NOW  . aspirin  324 mg Oral Pre-Cath  . aspirin EC  81 mg Oral Daily  . clopidogrel  75 mg Oral Q breakfast  . clopidogrel  75 mg Oral Q breakfast  . diazepam  5 mg Oral On Call  . ezetimibe-simvastatin  1 tablet Oral QHS  . heparin  3,500 Units Intravenous Once  . insulin aspart  0-9 Units Subcutaneous TID WC  . iron polysaccharides  150 mg Oral BID  . metoprolol tartrate  12.5 mg Oral BID  . multivitamin with minerals  1 tablet Oral Daily  . sodium chloride  3 mL Intravenous Q12H  . Tamsulosin HCl  0.4 mg Oral QPC breakfast  . DISCONTD: aspirin EC  81 mg Oral Daily    Assessment: 71 yo who has an extensive cardiac hx. He was admitted today for CP. IV heparin has been ordered to r/o MI. Plan for cath in AM. He had an hemicolectomy done back  on 7/5. He did have some anemia due to surgery. Last recorded hgb was 9.8 on 7/13. Initial heparin level <0.1 units/ml, no issues noted with infusion per RN.  Goal of Therapy:  Heparin level 0.3-0.7 units/ml Monitor platelets by anticoagulation protocol: Yes   Plan:  1. Increase heparin drip to 1050 units/hr 3. F/u with 6 hr heparin level  Japji Kok [place] 05/14/2012,1:55 AM

## 2012-05-14 NOTE — CV Procedure (Signed)
PTCA stenting report dictated on 05/14/2012 dictation number is [id number]

## 2012-05-14 NOTE — CV Procedure (Addendum)
1. Left subclavian arteriogram 2. PTA and balloon antiplastic to the subdural artery with use of a 5.0 x 20 mm Powerflex balloon 3. PTA and stenting of the left subdural artery with implantation of a 8.0 x 24 mm Genesis balloon expandable stent. Stenosis reduced from 95% to 0%.  Indication: Mr. [name] is a 71 year old Caucasian male with history of known coronary artery disease status post CABG in the past. He had a markedly abnormal stress test. He underwent diagnostic cardiac catheterization by Dr. 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 Hence was brought for revascularization via percutaneous route. I was asked to perform the procedure by my colleague Dr. Sharyn Lull.  Interventional data: Successful PTA and stenting of the left subclavian artery as dictated above. Stenosis reduced from 95% to 0%. No vertebral artery compromise.  Technique: Using Angiomax for anticoagulation and maintaining ACT greater than 200, initially utilized a 5 [place] LIMA catheter to engage the left subclavian artery. We attempted to cross the subclavian artery initially with a Versacore wire and then with a Glidewire. Because of difficulty in crossing this I utilized a 0.014" whisper wire and placed the tip of the wire in the brachial artery. The 5 [place] IMA catheter was pulled out of the body and the utilized a 5 [place] JR 4 guide catheter to cross the subclavian artery stenosis and the catheter was carefully positioned in the distal subclavian artery beyond the stenosis. Using 0.035" Rosen wire be cross the stenosis and placed the tip of the wire in the distal brachial artery. A 6 French shuttle sheath was then advanced over this wire into the left subclavian artery. Then we proceeded with balloon angioplasty using the PowerFlex balloon which was performed  at 4 atmospheric pressure followed by stenting with a 8.0 x 24 mm stent at 7 atmospheric pressure followed by withdrawing this stent balloon proximally and second inflation at 6 atmospheric pressure  was performed and angiography was performed. Excellent results was evident. The Rosen wire was withdrawn out of the body along with the shuttle sheath.  Patient of the procedure well. Patient was hemodynamically stable. Patient does have a circumflex coronary artery stenosis and further management will be per Dr. Sharyn Lull.

## 2012-05-14 NOTE — Progress Notes (Signed)
Subjective:  Patient denies any chest pain or shortness of breath at present  Objective:  Vital Signs in the last 24 hours: Temp:  [97.8 F (36.6 C)-98.6 F (37 C)] 98.6 F (37 C) (08/27 0500) Pulse Rate:  [64-86] 64  (08/27 0500) Resp:  [18] 18  (08/26 1550) BP: (107-161)/(67-74) 150/67 mmHg (08/27 0500) SpO2:  [98 %-100 %] 100 % (08/27 0500) Weight:  [57.4 kg (126 lb 8.7 oz)] 57.4 kg (126 lb 8.7 oz) (08/26 1550)  Intake/Output from previous day:   Intake/Output from this shift:    Physical Exam: Neck: no adenopathy, no carotid bruit, no JVD and supple, symmetrical, trachea midline Lungs: clear to auscultation bilaterally Heart: regular rate and rhythm, S1, S2 normal, no murmur, click, rub or gallop Abdomen: soft, non-tender; bowel sounds normal; no masses,  no organomegaly Extremities: extremities normal, atraumatic, no cyanosis or edema  Lab Results:  Basename 05/14/12 0025 05/13/12 1834  WBC 7.4 7.2  HGB 11.0* 11.3*  PLT 143* 145*    Basename 05/14/12 0025 05/13/12 1834  NA 140 140  K 4.2 4.2  CL 103 105  CO2 27 25  GLUCOSE 105* 110*  BUN 23 22  CREATININE 1.07 0.85    Basename 05/14/12 0531 05/14/12 0025  TROPONINI <0.30 <0.30   Hepatic Function Panel  Basename 05/13/12 1834  PROT 6.5  ALBUMIN 3.4*  AST 15  ALT 15  ALKPHOS 66  BILITOT 0.2*  BILIDIR --  IBILI --    Basename 05/14/12 0531  CHOL 140   No results found for this basename: PROTIME in the last 72 hours  Imaging: Imaging results have been reviewed and No results found.  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 death MI stroke need for emergency CABG risk of restenosis local vascular complications etc. and consented for PCI  LOS: 1 day    Brace Welte N 05/14/2012, 9:29 AM

## 2012-05-14 NOTE — Progress Notes (Signed)
Subjective:  Patient doing rather unit denies any chest pain or shortness of breath  Objective:  Vital Signs in the last 24 hours: Temp:  [97.8 F (36.6 C)-98.6 F (37 C)] 98.1 F (36.7 C) (08/27 1545) Pulse Rate:  [60-86] 60  (08/27 1324) BP: (107-150)/(67-73) 150/67 mmHg (08/27 0500) SpO2:  [99 %-100 %] 100 % (08/27 1545)  Intake/Output from previous day:   Intake/Output from this shift: Total I/O In: -  Out: 650 [Urine:650]  Physical Exam: Neck: no adenopathy, no carotid bruit, no JVD and supple, symmetrical, trachea midline Lungs: clear to auscultation bilaterally Heart: regular rate and rhythm, S1, S2 normal, no murmur, click, rub or gallop Abdomen: soft, non-tender; bowel sounds normal; no masses,  no organomegaly Extremities: extremities normal, atraumatic, no cyanosis or edema and right groin dressing dry no hematoma pedal pulses 1+  Lab Results:  Basename 05/14/12 0025 05/13/12 1834  WBC 7.4 7.2  HGB 11.0* 11.3*  PLT 143* 145*    Basename 05/14/12 0025 05/13/12 1834  NA 140 140  K 4.2 4.2  CL 103 105  CO2 27 25  GLUCOSE 105* 110*  BUN 23 22  CREATININE 1.07 0.85    Basename 05/14/12 0531 05/14/12 0025  TROPONINI <0.30 <0.30   Hepatic Function Panel  Basename 05/13/12 1834  PROT 6.5  ALBUMIN 3.4*  AST 15  ALT 15  ALKPHOS 66  BILITOT 0.2*  BILIDIR --  IBILI --    Basename 05/14/12 0531  CHOL 140   No results found for this basename: PROTIME in the last 72 hours  Imaging: Imaging results have been reviewed and No results found.  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

## 2012-05-14 NOTE — Progress Notes (Signed)
ANTICOAGULATION CONSULT NOTE - Follow Up Consult  Pharmacy Consult for Heparin Indication: chest pain/ACS  No Known Allergies  Vital Signs: Temp: 98.6 F (37 C) (08/27 0500) Temp src: Oral (08/27 0500) BP: 150/67 mmHg (08/27 0500) Pulse Rate: 64  (08/27 0500)  Labs:  Alvira Philips 05/14/12 0950 05/14/12 0531 05/14/12 0025 05/13/12 1834  HGB -- -- 11.0* 11.3*  HCT -- -- 32.8* 34.5*  PLT -- -- 143* 145*  APTT -- -- -- 33  LABPROT -- -- -- 13.6  INR -- -- -- 1.02  HEPARINUNFRC <0.10* -- <0.10* --  CREATININE -- -- 1.07 0.85  CKTOTAL -- 26 25 24   CKMB -- 1.9 1.9 1.8  TROPONINI -- <0.30 <0.30 <0.30    Estimated Creatinine Clearance: 52.2 ml/min (by C-G formula based on Cr of 1.07).   Medications:  Heparin @ 1050 units/hr  Assessment: 71yom continues on heparin with an undetectable heparin level despite rate increase this morning. No issues with infusion. CBC is low but stable. CEs negative x 3. Plan is for cath but not until later today as he is an add-on.  Goal of Therapy:  Heparin level 0.3-0.7 units/ml Monitor platelets by anticoagulation protocol: Yes   Plan:  1) Heparin bolus 2000 units x 1 2) Increase heparin gtt to 1250 units/hr 3) 6h heparin level vs follow up after cath  Fredrik Rigger 05/14/2012,11:53 AM

## 2012-05-14 NOTE — Progress Notes (Signed)
Site area: right groin  Site Prior to Removal:  Level 0  Pressure Applied For 20 MINUTES    Minutes Beginning at 1710  Manual:   yes  Patient Status During Pull:  AAO x3  Post Pull Groin Site:  Level 0  Post Pull Instructions Given:  yes  Post Pull Pulses Present:  yes  Dressing Applied:  yes  Comments:  Tolerated procedure well

## 2012-05-14 NOTE — Progress Notes (Signed)
Pt  pulled out both iv lines, claimed "I want to go home" upset looking for his family. Oriented to place, and person  And situation, calmed pt, explanation done on safety and issue of bleeding post procedure. Pt agreed to stay in bed and  staff will contact family . Emotional support provided with  close supervision .

## 2012-05-14 NOTE — Progress Notes (Signed)
INITIAL ADULT NUTRITION ASSESSMENT Date: 05/14/2012   Time: 2:06 PM  INTERVENTION:  Advance diet as medically appropriate RD to follow for nutrition care plan, add supplements when able  Reason for Assessment: Malnutrition Screening Tool Report  ASSESSMENT: Male 71 y.o.  Dx: chest pain  Hx:  Past Medical History  Diagnosis Date  . Myocardial infarction   . CVA (cerebral infarction)   . Diabetes mellitus   . Hypertension   . Diabetes mellitus   . Aphasia   . Dysphagia   . Stroke   . COPD (chronic obstructive pulmonary disease)   . Shortness of breath   . Anemia   . Colon cancer     Related Meds:     . aspirin  324 mg Oral NOW   Or  . aspirin  300 mg Rectal NOW  . aspirin  324 mg Oral Pre-Cath  . aspirin EC  81 mg Oral Daily  . bivalirudin      . clopidogrel      . clopidogrel  75 mg Oral Q breakfast  . diazepam  5 mg Oral On Call  . ezetimibe-simvastatin  1 tablet Oral QHS  . fentaNYL      . heparin      . heparin  2,000 Units Intravenous Once  . heparin  3,500 Units Intravenous Once  . insulin aspart  0-9 Units Subcutaneous TID WC  . iron polysaccharides  150 mg Oral BID  . lidocaine      . metoprolol tartrate  12.5 mg Oral BID  . midazolam      . multivitamin with minerals  1 tablet Oral Daily  . nitroGLYCERIN      . sodium chloride  3 mL Intravenous Q12H  . Tamsulosin HCl  0.4 mg Oral QPC breakfast  . DISCONTD: aspirin EC  81 mg Oral Daily  . DISCONTD: clopidogrel  75 mg Oral Q breakfast    Ht: 5\' 4"  (162.6 cm)  Wt: 126 lb 8.7 oz (57.4 kg)  Ideal Wt: 54.5 kg % Ideal Wt: 104%  Usual Wt: 67.6 kg -- per RD assessment June 2013 % Usual Wt: 85%  Body mass index is 21.72 kg/(m^2).  Food/Nutrition Related Hx: recent weight loss without trying per admission nutrition screen  Labs:  CMP     Component Value Date/Time   NA 140 05/14/2012 0025   K 4.2 05/14/2012 0025   CL 103 05/14/2012 0025   CO2 27 05/14/2012 0025   GLUCOSE 105* 05/14/2012 0025     BUN 23 05/14/2012 0025   CREATININE 1.07 05/14/2012 0025   CALCIUM 9.6 05/14/2012 0025   PROT 6.5 05/13/2012 1834   ALBUMIN 3.4* 05/13/2012 1834   AST 15 05/13/2012 1834   ALT 15 05/13/2012 1834   ALKPHOS 66 05/13/2012 1834   BILITOT 0.2* 05/13/2012 1834   GFRNONAA 68* 05/14/2012 0025   GFRAA 79* 05/14/2012 0025     Intake/Output Summary (Last 24 hours) at 05/14/12 1408 Last data filed at 05/14/12 1254  Gross per 24 hour  Intake      0 ml  Output    650 ml  Net   -650 ml    CBG (last 3)   Basename 05/14/12 1142 05/14/12 0715 05/13/12 2105  GLUCAP 142* 140* 170*    Diet Order: NPO  Supplements/Tube Feeding: N/A  IVF:    sodium chloride Last Rate: 50 mL/hr at 05/13/12 1958  sodium chloride Last Rate: 50 mL/hr at 05/14/12 1243  heparin Last Rate:  1,250 Units/hr (05/14/12 1221)  nitroGLYCERIN Last Rate: 5 mcg/min (05/13/12 1956)  DISCONTD: heparin Last Rate: 1,050 Units/hr (05/14/12 0158)    Estimated Nutritional Needs:   Kcal: 1700-1900 Protein: 80-90 gm Fluid: 1.7-1.9 L  Patient admitted for chest pain; reports his appetite has been poor PTA; his family reports he can only eat small amount at a sitting; also state he's lost "a bunch" of weight since his abdominal surgery (partial colectomy) in July 2013; pre-op diagnosis was R colon mass; per records, he's lost 22 lbs in this time frame (15%); currently NPO however would benefit from addition of supplements when able.  Patient meets criteria for severe malnutrition in the context of chronic illness given < 75% intake of estimated energy requirement for > 1 month and 15% weight loss x 2 months.  NUTRITION DIAGNOSIS: -Inadequate oral intake (NI-2.1).  Status: Ongoing  RELATED TO: inability to eat  AS EVIDENCE BY: NPO status  MONITORING/EVALUATION(Goals): Goal: Oral intake with meals & supplements to meet >/= 90% of estimated nutrition needs Monitor: PO & supplemental intake, weight, labs, I/O's  EDUCATION NEEDS: -No  education needs identified at this time  Dietitian #: [phone]  DOCUMENTATION CODES Per approved criteria  -Severe malnutrition in the context of chronic illness    Alger Memos 05/14/2012, 2:06 PM

## 2012-05-15 LAB — GLUCOSE, CAPILLARY: Glucose-Capillary: 159 mg/dL — ABNORMAL HIGH (ref 70–99)

## 2012-05-15 LAB — CBC
Hemoglobin: 10.1 g/dL — ABNORMAL LOW (ref 13.0–17.0)
MCH: 25.1 pg — ABNORMAL LOW (ref 26.0–34.0)
Platelets: 136 10*3/uL — ABNORMAL LOW (ref 150–400)
RBC: 4.02 MIL/uL — ABNORMAL LOW (ref 4.22–5.81)
WBC: 4.8 10*3/uL (ref 4.0–10.5)

## 2012-05-15 LAB — BASIC METABOLIC PANEL
CO2: 25 mEq/L (ref 19–32)
Calcium: 9 mg/dL (ref 8.4–10.5)
Glucose, Bld: 119 mg/dL — ABNORMAL HIGH (ref 70–99)
Potassium: 4.3 mEq/L (ref 3.5–5.1)
Sodium: 137 mEq/L (ref 135–145)

## 2012-05-15 MED ORDER — SITAGLIPTIN PHOS-METFORMIN HCL 50-1000 MG PO TABS: 1.0000 | ORAL_TABLET | Freq: Two times a day (BID) | ORAL | Status: DC

## 2012-05-15 MED ORDER — METOPROLOL TARTRATE 12.5 MG HALF TABLET: 12.5000 mg | ORAL_TABLET | Freq: Two times a day (BID) | ORAL | Status: DC

## 2012-05-15 MED ORDER — FAMOTIDINE 20 MG PO TABS: 20.0000 mg | ORAL_TABLET | Freq: Two times a day (BID) | ORAL | Status: DC

## 2012-05-15 MED ORDER — CLOPIDOGREL BISULFATE 75 MG PO TABS: 75.0000 mg | ORAL_TABLET | Freq: Every day | ORAL | Status: DC

## 2012-05-15 MED FILL — Dextrose Inj 5%: INTRAVENOUS | Qty: 50 | Status: AC

## 2012-05-15 NOTE — Progress Notes (Signed)
Pt became agitated after sheath pull because his family had left.  He pulled both iv's out and said he was "going home".  Side rails up x 4, bed alarm on.  Speech garbled from previous CVA, difficult to understand.  Called daughter Kathie Rhodes to update.  Pt eventually calmed down.  Daughter came back to hospital, pt immediately became agitated again, kicking and swinging at her.  Daughter explained to pt that he could go home tomorrow, not tonight, and that he was going home with her and not back to NH.  Daughter stayed only few minutes.  Pt calmed down shortly after she left.  IV team RN inserted another PIV, pt calm and cooperative.  IV NS resumed, NTG d/c'd for BP 103/52

## 2012-05-15 NOTE — Cardiovascular Report (Signed)
[name], [name]             ACCOUNT NO.:  [id number]  MEDICAL RECORD NO.:  [id number]  LOCATION:  6524                         FACILITY:  [facility]  PHYSICIAN:  Donta Fuster N. Sharyn Lull, M.D. DATE OF BIRTH:  09-Dec-1940  DATE OF PROCEDURE:  05/14/2012 DATE OF DISCHARGE:                           CARDIAC CATHETERIZATION   PROCEDURE: 1. Successful PTCA to mid left circumflex using initially 2.0 x 15 mm     long mini Trek balloon and then 2.5 x 20 mm long mini Trek balloon. 2. Successful deployment of 2.5 x 38 mm long Xience Expedition drug-     eluting stent in mid left circumflex. 3. Successful postdilatation of the stent using 2.75 x 20 mm long [place]     Trek balloon.  INDICATION FOR THE PROCEDURE:  [name] is a 71 year old male with past medical history significant for multiple medical problems, i.e., coronary artery disease, history of MI in the past status post CABG, multivessel coronary artery disease, hypertension, diabetes mellitus, history of left CVA with right paresis and expressive aphasia, COPD, history of adenocarcinoma of the colon status post right hemicolectomy. He came to the office complaining of recurrent retrosternal chest pain described as pressure, off and on since yesterday.  The patient received sublingual nitro with relief of chest pain.  The patient denies any nausea, vomiting, diaphoresis.  Denies shortness of breath.  Denies PND, orthopnea, or leg swelling.  Denies palpitation, lightheadedness, or syncope.  The patient had stress test done in July 2013 which was mildly positive for ischemia by nuclear scan, although the patient had prolonged ST depression during the stress portion in anterolateral leads.  The patient was admitted to telemetry unit.  MI was ruled out by serial enzymes and EKG.  The patient had subsequently cath in July 2013 and was noted to have critical subclavian stenosis and also multiple sequential stenosis in left circumflex which was  never bypassed.  The patient subsequently today underwent PTA stenting to left subclavian artery with excellent result by Dr. Jacinto Halim as per procedure report. After obtaining the informed consent, the patient was brought to the cath lab and was placed on fluoroscopy table.  Right groin was prepped and draped in usual fashion.  Xylocaine 1% was used for local anesthesia in the right groin.  With the help of thin wall needle, 6-French 3.0 Voda guiding catheter was advanced up to the ascending aorta.  Wire was pulled out.  The catheter was aspirated and connected to the Manifold. Catheter was further advanced and engaged into left coronary ostium. Multiple views of the left system were taken.  FINDINGS:  Left circumflex has multiple 70-75% sequential mid stenosis as before.  [name] was 100% occluded filling by LIMA as before.  INTERVENTIONAL PROCEDURE:  Successful PTCA to mid left circumflex was done using 2.0 x 15 mm long mini Trek balloon initially and then 2.5 x 20 mm long Trek balloon for predilatation.  Next, 2.5 x 38 long Xience drug-eluting stent was deployed at 11 atmospheric pressure in mid left circumflex.  This stent was post dilated using 2.75 x 20 mm long [place] Trek balloon going up to 11 and 15 atmospheric pressure for 30 seconds. Lesions dilated from  70-75% to 0% residual with excellent TIMI grade 3 distal flow without evidence of dissection or distal embolization.  The patient received weight-based Angiomax and 300 mg of Plavix prior to PCI.  The patient tolerated procedure well.  There were no complications.  The patient was transferred to recovery room in stable condition.     [name]. Sharyn Lull, M.D.     [facility]/MED[facility]  D:  05/14/2012  T:  05/15/2012  Job:  [id number]

## 2012-05-15 NOTE — Progress Notes (Signed)
[id number] Cardiac Rehab Completed discharge education with pt wife and daughter. Pt declines Outpt. CRP, not interested.

## 2012-05-15 NOTE — Progress Notes (Signed)
CARDIAC REHAB PHASE I   PRE:  Rate/Rhythm: 84 SR  BP:  Supine: 132/63  Sitting:   Standing:    SaO2:   MODE:  Ambulation: 564 ft   POST:  Rate/Rhythem: 110 ST  BP:  Supine:   Sitting: 154/56  Standing:    SaO2:  0800-0830 On arrival pt in recliner. Assisted X 1 and used walker to ambulate. Gait steady with walker. VS stable. Pt tired bu end of walk. Denies any cp. Pt back to recliner after walk with call light in reach. Ask pt's RN to page me when his family comes so that we can go over discharge  stent education. I am unsure how much pt understands due to his expressive aphasia.  Beatrix Fetters

## 2012-05-16 NOTE — Discharge Summary (Signed)
NAMEMUSA, REWERTS             ACCOUNT NO.:  [id number]  MEDICAL RECORD NO.:  [id number]  LOCATION:  6524                         FACILITY:  [facility]  PHYSICIAN:  Satomi Buda N. Sharyn Lull, M.D. DATE OF BIRTH:  December 18, 1940  DATE OF ADMISSION:  05/13/2012 DATE OF DISCHARGE:  05/15/2012                              DISCHARGE SUMMARY   ADMITTING DIAGNOSES: 1. Unstable angina, rule out myocardial infarction. 2. Coronary artery disease, history of myocardial infarction in the     past status post CABG. 3. Mildly positive Lexiscan Myoview in recent past. 4. Multivessel coronary artery disease and critical left subclavian     stenosis. 5. Hypertension. 6. Diabetes mellitus. 7. History of left cerebrovascular accident with right paresis with     expressive aphasia. 8. Chronic obstructive pulmonary disease. 9. History of adenocarcinoma of colon status post right hemicolectomy     in the recent past.  FINAL DIAGNOSES: 1. Status post unstable angina, myocardial infarction ruled out,     status post PTCA stenting to left subclavian and left circumflex     coronary artery, multivessel coronary artery disease, history of     myocardial infarction in the past status post CABG. 2. Hypertension. 3. Diabetes mellitus. 4. History of left cerebrovascular accident with right paresis with     expressive aphasia. 5. Chronic obstructive pulmonary disease. 6. History of adenocarcinoma of colon status post right hemicolectomy     in the past.  DISCHARGE HOME MEDICATIONS: 1. Enteric-coated aspirin 81 mg 1 tablet daily. 2. Clopidogrel 75 mg 1 tablet daily. 3. Pepcid 20 mg 1 tablet twice daily. 4. Metoprolol tartrate 12.5 mg twice daily. 5. Vytorin 10/40, 1 tablet daily. 6. Vicodin 1 tablet every 6 hours as needed. 7. Niferex 150 mg twice daily as before. 8. Multivitamin 1 tablet daily as before. 9. Nitrostat 0.4 mg spray every 5 minutes, use as directed. 10.Serax 30 mg by mouth at  bedtime. 11.Promethazine 25 mg every 6 hours as needed. 12.Janumet 50/1000, 1 tablet by mouth twice daily with meals, starting     August 29. 13.Flomax 0.4 mg daily in the morning. 14.Vitamin C 500 mg twice daily as before. 15.Zinc sulfate 220 mg 1 capsule daily.  DIET:  Low salt, low cholesterol 1800 calories ADA diet.  Post-PTCA stent instructions have been given.  The patient has been advised to monitor blood pressure and blood sugar daily.  Follow up with me in 1 week.  CONDITION AT DISCHARGE:  Stable.  BRIEF HISTORY AND HOSPITAL COURSE:  Mr. Scharnhorst is a 71 year old male with past medical history significant for multiple medical problems, i.e., coronary artery disease, history of MI in the past status post CABG, multivessel coronary artery disease, hypertension, diabetes mellitus, history of left CVA with right paresis and expressive aphasia, COPD, history of right colonic adenocarcinoma, status post right hemicolectomy, came to the office complaining of recurrent retrosternal chest pain with his daughter, described as pressure, off and on since yesterday, relieved with sublingual nitro.  The patient denies any nausea, vomiting, diaphoresis.  Denies shortness of breath.  Denies any PND, orthopnea, or leg swelling.  Denies palpitation, lightheadedness, or syncope.  The patient had stress test in July 2013 which was  positive for ischemia and had prolonged ST depression during the stress portion in anterolateral leads.  The patient denies any relation of chest pain to food, breathing, or movement.  PAST MEDICAL HISTORY:  As above.  PAST SURGICAL HISTORY:  He had CABG in the past.  Had appendectomy in the past.  Had dental surgery in the past, had colonoscopy and partial colectomy in the past.  PHYSICAL EXAMINATION:  VITAL SIGNS:  His blood pressure was 161/74, pulse was 76, he was afebrile. EYES:  Conjunctivae were pink. NECK:  Supple.  No JVD.  No bruit. LUNGS:  Clear to  auscultation without rhonchi or rales. CARDIOVASCULAR:  S1, S2 was normal.  There was no S3 or S4 gallop. ABDOMEN:  Soft.  Bowel sounds were present, nontender. EXTREMITIES:  There is no clubbing, cyanosis, or edema.  LABORATORY DATA:  Sodium was 140, potassium 4.2, glucose was 110, BUN 22, creatinine 0.85.  His 3 sets of cardiac enzymes were negative. Hemoglobin was 11.3, hematocrit 34.5, white count of 7.2.  His cholesterol was 140, triglycerides 143, HDL 60, LDL was 51.  His P2Y12 platelet inhibition level was 149.  BRIEF HOSPITAL COURSE:  The patient was admitted to telemetry unit.  MI was ruled out by serial enzymes and EKG.  The patient subsequently underwent PTCA stenting to PTA and stenting to left subclavian artery by Dr. Delrae Rend with excellent results and followed by PTCA stenting to left circumflex as per procedures report.  The patient did not have any further episodes of chest pain after the PCI.  Phase 1 cardiac rehab was called.  The patient ambulated in the hallway without any problems. His groin is stable with no evidence of hematoma or bruit.  The patient will be discharged home on above medications and will be followed up in my office in 1 week.     Eduardo Osier. Sharyn Lull, M.D.     [facility]MED[facility]  D:  05/15/2012  T:  05/16/2012  Job:  [id number]

## 2012-07-14 ENCOUNTER — Emergency Department (HOSPITAL_COMMUNITY): Payer: Medicare Other

## 2012-07-14 ENCOUNTER — Encounter (HOSPITAL_COMMUNITY): Payer: Self-pay | Admitting: Emergency Medicine

## 2012-07-14 ENCOUNTER — Emergency Department (HOSPITAL_COMMUNITY)
Admission: EM | Admit: 2012-07-14 | Discharge: 2012-07-14 | Disposition: A | Discharge status: Home / Self Care | Payer: Medicare Other | Attending: Emergency Medicine | Admitting: Emergency Medicine

## 2012-07-14 DIAGNOSIS — Z85038 Personal history of other malignant neoplasm of large intestine: Secondary | ICD-10-CM | POA: Insufficient documentation

## 2012-07-14 DIAGNOSIS — Z8673 Personal history of transient ischemic attack (TIA), and cerebral infarction without residual deficits: Secondary | ICD-10-CM | POA: Insufficient documentation

## 2012-07-14 DIAGNOSIS — Z79899 Other long term (current) drug therapy: Secondary | ICD-10-CM | POA: Insufficient documentation

## 2012-07-14 DIAGNOSIS — Z9049 Acquired absence of other specified parts of digestive tract: Secondary | ICD-10-CM | POA: Insufficient documentation

## 2012-07-14 DIAGNOSIS — Z7982 Long term (current) use of aspirin: Secondary | ICD-10-CM | POA: Insufficient documentation

## 2012-07-14 DIAGNOSIS — I252 Old myocardial infarction: Secondary | ICD-10-CM | POA: Insufficient documentation

## 2012-07-14 DIAGNOSIS — D649 Anemia, unspecified: Secondary | ICD-10-CM | POA: Insufficient documentation

## 2012-07-14 DIAGNOSIS — N390 Urinary tract infection, site not specified: Secondary | ICD-10-CM | POA: Insufficient documentation

## 2012-07-14 DIAGNOSIS — I10 Essential (primary) hypertension: Secondary | ICD-10-CM | POA: Insufficient documentation

## 2012-07-14 DIAGNOSIS — Z7901 Long term (current) use of anticoagulants: Secondary | ICD-10-CM | POA: Insufficient documentation

## 2012-07-14 DIAGNOSIS — J449 Chronic obstructive pulmonary disease, unspecified: Secondary | ICD-10-CM | POA: Insufficient documentation

## 2012-07-14 DIAGNOSIS — J4489 Other specified chronic obstructive pulmonary disease: Secondary | ICD-10-CM | POA: Insufficient documentation

## 2012-07-14 DIAGNOSIS — E119 Type 2 diabetes mellitus without complications: Secondary | ICD-10-CM | POA: Insufficient documentation

## 2012-07-14 DIAGNOSIS — R109 Unspecified abdominal pain: Secondary | ICD-10-CM

## 2012-07-14 DIAGNOSIS — Z87891 Personal history of nicotine dependence: Secondary | ICD-10-CM | POA: Insufficient documentation

## 2012-07-14 LAB — COMPREHENSIVE METABOLIC PANEL
AST: 13 U/L (ref 0–37)
Albumin: 3.8 g/dL (ref 3.5–5.2)
BUN: 34 mg/dL — ABNORMAL HIGH (ref 6–23)
Calcium: 9.8 mg/dL (ref 8.4–10.5)
Creatinine, Ser: 1.17 mg/dL (ref 0.50–1.35)
Total Protein: 7.3 g/dL (ref 6.0–8.3)

## 2012-07-14 LAB — CBC WITH DIFFERENTIAL/PLATELET
Basophils Absolute: 0 10*3/uL (ref 0.0–0.1)
Basophils Relative: 0 % (ref 0–1)
Eosinophils Absolute: 0.2 10*3/uL (ref 0.0–0.7)
HCT: 38.9 % — ABNORMAL LOW (ref 39.0–52.0)
Hemoglobin: 13 g/dL (ref 13.0–17.0)
MCH: 26.5 pg (ref 26.0–34.0)
MCHC: 33.4 g/dL (ref 30.0–36.0)
Monocytes Absolute: 0.7 10*3/uL (ref 0.1–1.0)
Monocytes Relative: 10 % (ref 3–12)
RDW: 14.8 % (ref 11.5–15.5)

## 2012-07-14 LAB — URINALYSIS, MICROSCOPIC ONLY
Glucose, UA: NEGATIVE mg/dL
Hgb urine dipstick: NEGATIVE
Protein, ur: 100 mg/dL — AB
Specific Gravity, Urine: 1.022 (ref 1.005–1.030)
pH: 5 (ref 5.0–8.0)

## 2012-07-14 MED ORDER — CEPHALEXIN 500 MG PO CAPS: 500.0000 mg | ORAL_CAPSULE | Freq: Four times a day (QID) | ORAL | Status: DC

## 2012-07-14 NOTE — ED Notes (Signed)
Pt reports having LLQ pain X 2 months; reports dysuria, denies n/v, fevers

## 2012-07-14 NOTE — Progress Notes (Signed)
9:50 PM Pt is a 71 year old man, aphasic from a stroke, who has abdominal pain that seems localized to the lower end of a midline abdominal incision.  He had a right colectomy on March 22, 2012 for colon cancer. Exam shows him to be mildly tender over the lower end of his abdominal incision.  There is no mass, rebound or rigidity.  CBC chemistries WNL.  UA shows a UTI.  Abdominal x-ray shows no bowel obstruction and no free air.  Call to Dr. Luisa Hart, covering for General Surgery.  He advised that pt's wife should call [facility] to make him an appointment to be seen in the office this coming week.  Will Rx for UTI.

## 2012-07-14 NOTE — ED Provider Notes (Signed)
History     CSN: [id number]  Arrival date & time 07/14/12  1620   First MD Initiated Contact with Patient 07/14/12 1930      Chief Complaint  Patient presents with  . Abdominal Pain    (Consider location/radiation/quality/duration/timing/severity/associated sxs/prior treatment) HPI Patient presents to the emergency department with abdominal pain that started earlier this morning.  Patient states that he had pain right around the incision site from his tumor resection for colon cancer.  Patient states that at this time he is not having much discomfort.  Patient denies nausea, vomiting, diarrhea, headache, chest pain, shortness of breath, back pain, dysuria, fever, or dizziness.  The patient states he has not taken anything prior to arrival for his symptoms.  Patient and family were giving a history due to the fact of the patient's has had  some difficulty speaking after a stroke.  Patient denies anything exacerbates or helps the pain Past Medical History  Diagnosis Date  . Myocardial infarction   . CVA (cerebral infarction)   . Diabetes mellitus   . Hypertension   . Diabetes mellitus   . Aphasia   . Dysphagia   . COPD (chronic obstructive pulmonary disease)   . Shortness of breath   . Anemia   . Stroke   . Colon cancer     Past Surgical History  Procedure Date  . Coronary artery bypass graft   . Appendectomy   . Dental surgery   . Colonoscopy 03/13/2012    Procedure: COLONOSCOPY;  Surgeon: Hart Carwin, MD;  Location: [facility];  Service: Endoscopy;  Laterality: N/A;  . Partial colectomy 03/22/2012    Procedure: PARTIAL COLECTOMY;  Surgeon: Cherylynn Ridges, MD;  Location: [facility] OR;  Service: General;  Laterality: Right;    Family History  Problem Relation Age of Onset  . Breast cancer Mother   . Heart disease Brother   . Colon cancer Maternal Uncle     History  Substance Use Topics  . Smoking status: Former Smoker -- 16 years    Types: Cigarettes    Quit date:  12/27/2011  . Smokeless tobacco: Never Used  . Alcohol Use: No      Review of Systems All other systems negative except as documented in the HPI. All pertinent positives and negatives as reviewed in the HPI.  Allergies  Review of patient's allergies indicates no known allergies.  Home Medications   Current Outpatient Rx  Name Route Sig Dispense Refill  . ASPIRIN EC 81 MG PO TBEC Oral Take 81 mg by mouth daily. For CVA prophylaxis.    Marland Kitchen CLOPIDOGREL BISULFATE 75 MG PO TABS Oral Take 1 tablet (75 mg total) by mouth daily with breakfast. 30 tablet 3  . HYDROCODONE-ACETAMINOPHEN 5-325 MG PO TABS Oral Take 1 tablet by mouth every 6 (six) hours as needed. For pain.    Marland Kitchen POLYSACCHARIDE IRON COMPLEX 150 MG PO CAPS Oral Take 150 mg by mouth 2 (two) times daily.    Marland Kitchen METOPROLOL TARTRATE 12.5 MG HALF TABLET Oral Take 0.5 tablets (12.5 mg total) by mouth 2 (two) times daily. 30 tablet 3  . ADULT MULTIVITAMIN W/MINERALS CH Oral Take 1 tablet by mouth daily.    Marland Kitchen OXAZEPAM 30 MG PO CAPS Oral Take 30 mg by mouth at bedtime.    Marland Kitchen SITAGLIPTIN-METFORMIN HCL 50-1000 MG PO TABS Oral Take 1 tablet by mouth 2 (two) times daily with a meal. 60 tablet 3  . TAMSULOSIN HCL 0.4 MG PO  CAPS Oral Take 0.4 mg by mouth daily after breakfast.    . VITAMIN C 500 MG PO TABS Oral Take 500 mg by mouth 2 (two) times daily.    Marland Kitchen ZINC SULFATE 220 MG PO CAPS Oral Take 220 mg by mouth daily.    Marland Kitchen EZETIMIBE-SIMVASTATIN 10-40 MG PO TABS Oral Take 1 tablet by mouth at bedtime.    Marland Kitchen NITROGLYCERIN 0.4 MG/SPRAY TL SOLN Sublingual Place 1 spray under the tongue every 5 (five) minutes as needed. Chest pain. Every 5 minutes up to 3 doses.    Marland Kitchen PROMETHAZINE HCL 25 MG PO TABS Oral Take 25 mg by mouth every 6 (six) hours as needed. For nausea.      BP 141/64  Pulse 75  Temp 98.8 F (37.1 C) (Oral)  Resp 20  SpO2 100%  Physical Exam  Nursing note and vitals reviewed. Constitutional: He is oriented to person, place, and time. He  appears well-developed and well-nourished. No distress.  HENT:  Head: Normocephalic and atraumatic.  Mouth/Throat: Oropharynx is clear and moist.  Eyes: Pupils are equal, round, and reactive to light.  Neck: Normal range of motion. Neck supple.  Cardiovascular: Normal rate, regular rhythm and normal heart sounds.  Exam reveals no gallop and no friction rub.   No murmur heard. Pulmonary/Chest: Effort normal and breath sounds normal.  Abdominal: Soft. Bowel sounds are normal. He exhibits no distension. There is no tenderness. There is no guarding.  Neurological: He is alert and oriented to person, place, and time.  Skin: Skin is warm and dry. No rash noted.    ED Course  Procedures (including critical care time)  Labs Reviewed  CBC WITH DIFFERENTIAL - Abnormal; Notable for the following:    HCT 38.9 (*)     All other components within normal limits  COMPREHENSIVE METABOLIC PANEL - Abnormal; Notable for the following:    Sodium 133 (*)     Glucose, Bld 112 (*)     BUN 34 (*)     GFR calc non Af Amer 61 (*)     GFR calc Af Amer 71 (*)     All other components within normal limits  URINALYSIS, MICROSCOPIC ONLY - Abnormal; Notable for the following:    APPearance CLOUDY (*)     Protein, ur 100 (*)     Leukocytes, UA MODERATE (*)     All other components within normal limits   Dg Abd Acute W/chest  07/14/2012  *RADIOLOGY REPORT*  Clinical Data: Chest and mid abdominal pain for 2 days.  ACUTE ABDOMEN SERIES (ABDOMEN 2 VIEW & CHEST 1 VIEW)  Comparison: CT abdomen and pelvis 03/11/2012.  Plain film chest 04/23/2012.  Findings: Swelling of the chest demonstrates postoperative change of CABG.  Lungs are clear.  Heart size is normal.  No pneumothorax or pleural fluid.  Two views of the abdomen show no free intraperitoneal air.  The bowel gas pattern is unremarkable.  A 0.8 cm calcification in the right upper quadrant is consistent with the stone in the right renal pelvis seen on the comparison  CT.  Lower lumbar degenerative change is noted.  IMPRESSION:  1.  No acute finding chest or abdomen. 2.  0.8 cm calcification correlating with stone at the right renal pelvis seen on prior CT.   Original Report Authenticated By: Bernadene Bell. Maricela Curet, M.D.      Spoke with [facility], and they'll followup with him in the office.  Patient we treated for UTI.  Patient advised to return here as needed.  On my exam.  There was no signs of abdominal  Tenderness.    MDM  MDM Reviewed: nursing note, vitals and previous chart Reviewed previous: labs Interpretation: labs and x-ray Consults: general surgery            Carlyle Dolly, PA-C 07/14/12 2201

## 2012-07-15 NOTE — ED Provider Notes (Signed)
Medical screening examination/treatment/procedure(s) were conducted as a shared visit with non-physician practitioner(s) and myself.  I personally evaluated the patient during the encounter 9:50 PM  Pt is a 71 year old man, aphasic from a stroke, who has abdominal pain that seems localized to the lower end of a midline abdominal incision. He had a right colectomy on March 22, 2012 for colon cancer. Exam shows him to be mildly tender over the lower end of his abdominal incision. There is no mass, rebound or rigidity. CBC chemistries WNL. UA shows a UTI. Abdominal x-ray shows no bowel obstruction and no free air. Call to Dr. Luisa Hart, covering for General Surgery. He advised that pt's wife should call [facility] to make him an appointment to be seen in the office this coming week. Will Rx for UTI.        Carleene Cooper III, MD 07/15/12 1248

## 2012-07-22 ENCOUNTER — Telehealth: Payer: Self-pay | Admitting: Oncology

## 2012-07-22 NOTE — Telephone Encounter (Signed)
S/W pt wife in re [name] F/U appt 11/12 @ 8:30 w/Dr. Truett Perna.  Welcome packet mailed.

## 2012-07-29 ENCOUNTER — Emergency Department (HOSPITAL_COMMUNITY)
Admission: EM | Admit: 2012-07-29 | Discharge: 2012-07-29 | Disposition: A | Discharge status: Home / Self Care | Payer: Medicare Other | Source: Home / Self Care

## 2012-07-29 ENCOUNTER — Encounter (HOSPITAL_COMMUNITY): Payer: Self-pay | Admitting: *Deleted

## 2012-07-29 ENCOUNTER — Emergency Department (INDEPENDENT_AMBULATORY_CARE_PROVIDER_SITE_OTHER): Payer: Medicare Other

## 2012-07-29 DIAGNOSIS — J449 Chronic obstructive pulmonary disease, unspecified: Secondary | ICD-10-CM

## 2012-07-29 DIAGNOSIS — J40 Bronchitis, not specified as acute or chronic: Secondary | ICD-10-CM

## 2012-07-29 LAB — POCT URINALYSIS DIP (DEVICE)
Glucose, UA: NEGATIVE mg/dL
Leukocytes, UA: NEGATIVE
Nitrite: NEGATIVE
Urobilinogen, UA: 0.2 mg/dL (ref 0.0–1.0)

## 2012-07-29 MED ORDER — AZITHROMYCIN 250 MG PO TABS: 250.0000 mg | ORAL_TABLET | Freq: Every day | ORAL | Status: DC

## 2012-07-29 MED ORDER — GUAIFENESIN-CODEINE 100-10 MG/5ML PO SYRP: 5.0000 mL | ORAL_SOLUTION | Freq: Four times a day (QID) | ORAL | Status: DC | PRN

## 2012-07-29 MED ORDER — ALBUTEROL SULFATE HFA 108 (90 BASE) MCG/ACT IN AERS: 1.0000 | INHALATION_SPRAY | Freq: Four times a day (QID) | RESPIRATORY_TRACT | Status: DC | PRN

## 2012-07-29 NOTE — ED Notes (Signed)
Wet cough noted

## 2012-07-29 NOTE — ED Provider Notes (Signed)
History     CSN: [id number]  Arrival date & time 07/29/12  1805   None     Chief Complaint  Patient presents with  . Cough  . Fever    (Consider location/radiation/quality/duration/timing/severity/associated sxs/prior treatment) HPI Comments: 71 year old man presents with a loose cough for 3 days. He is also having some left lower lateral costal margin pain. He denies fever or, upper respiratory congestion, nasal congestion runny nose or sore throat or earache. He also complains of weakness and feeling tired but denies shortness of breath. He has history of COPD he does not use HFA's. It is most notable symptom is a loose cough.   Past Medical History  Diagnosis Date  . Myocardial infarction   . CVA (cerebral infarction)   . Diabetes mellitus   . Hypertension   . Diabetes mellitus   . Aphasia   . Dysphagia   . COPD (chronic obstructive pulmonary disease)   . Shortness of breath   . Anemia   . Stroke   . Colon cancer     Past Surgical History  Procedure Date  . Coronary artery bypass graft   . Appendectomy   . Dental surgery   . Colonoscopy 03/13/2012    Procedure: COLONOSCOPY;  Surgeon: Hart Carwin, MD;  Location: [facility];  Service: Endoscopy;  Laterality: N/A;  . Partial colectomy 03/22/2012    Procedure: PARTIAL COLECTOMY;  Surgeon: Cherylynn Ridges, MD;  Location: [facility] OR;  Service: General;  Laterality: Right;  . Coronary artery bypass graft     x3  . Coronary stent placement     Family History  Problem Relation Age of Onset  . Breast cancer Mother   . Heart disease Brother   . Colon cancer Maternal Uncle     History  Substance Use Topics  . Smoking status: Former Smoker -- 16 years    Types: Cigarettes    Quit date: 12/27/2011  . Smokeless tobacco: Never Used  . Alcohol Use: No      Review of Systems  Constitutional: Positive for fatigue. Negative for fever, chills, activity change and appetite change.  HENT: Negative.   Eyes: Negative.     Respiratory: Positive for cough and chest tightness. Negative for shortness of breath and wheezing.   Cardiovascular: Negative for palpitations and leg swelling.  Gastrointestinal: Positive for vomiting.  Genitourinary: Negative.   Musculoskeletal: Negative.   Skin: Negative.   Neurological: Positive for speech difficulty. Negative for dizziness and light-headedness.    Allergies  Review of patient's allergies indicates no known allergies.  Home Medications   Current Outpatient Rx  Name  Route  Sig  Dispense  Refill  . ASPIRIN EC 81 MG PO TBEC   Oral   Take 81 mg by mouth daily. For CVA prophylaxis.         Marland Kitchen CLOPIDOGREL BISULFATE 75 MG PO TABS   Oral   Take 1 tablet (75 mg total) by mouth daily with breakfast.   30 tablet   3   . EZETIMIBE-SIMVASTATIN 10-40 MG PO TABS   Oral   Take 1 tablet by mouth at bedtime.         Marland Kitchen HYDROCODONE-ACETAMINOPHEN 5-325 MG PO TABS   Oral   Take 1 tablet by mouth every 6 (six) hours as needed. For pain.         Marland Kitchen POLYSACCHARIDE IRON COMPLEX 150 MG PO CAPS   Oral   Take 150 mg by mouth 2 (two) times daily.         Marland Kitchen  METOPROLOL TARTRATE 12.5 MG HALF TABLET   Oral   Take 0.5 tablets (12.5 mg total) by mouth 2 (two) times daily.   30 tablet   3   . ADULT MULTIVITAMIN W/MINERALS CH   Oral   Take 1 tablet by mouth daily.         Marland Kitchen NITROGLYCERIN 0.4 MG/SPRAY TL SOLN   Sublingual   Place 1 spray under the tongue every 5 (five) minutes as needed. Chest pain. Every 5 minutes up to 3 doses.         Marland Kitchen OXAZEPAM 30 MG PO CAPS   Oral   Take 30 mg by mouth at bedtime.         Marland Kitchen PROMETHAZINE HCL 25 MG PO TABS   Oral   Take 25 mg by mouth every 6 (six) hours as needed. For nausea.         Marland Kitchen SITAGLIPTIN-METFORMIN HCL 50-1000 MG PO TABS   Oral   Take 1 tablet by mouth 2 (two) times daily with a meal.   60 tablet   3   . TAMSULOSIN HCL 0.4 MG PO CAPS   Oral   Take 0.4 mg by mouth daily after breakfast.         .  VITAMIN C 500 MG PO TABS   Oral   Take 500 mg by mouth 2 (two) times daily.         Marland Kitchen ZINC SULFATE 220 MG PO CAPS   Oral   Take 220 mg by mouth daily.         . ALBUTEROL SULFATE HFA 108 (90 BASE) MCG/ACT IN AERS   Inhalation   Inhale 1-2 puffs into the lungs every 6 (six) hours as needed for wheezing.   1 Inhaler   0   . AZITHROMYCIN 250 MG PO TABS   Oral   Take 1 tablet (250 mg total) by mouth daily. 2 tabs po on day 1, 1 tab po on days 2-5   6 tablet   0   . GUAIFENESIN-CODEINE 100-10 MG/5ML PO SYRP   Oral   Take 5 mLs by mouth 4 (four) times daily as needed for cough.   120 mL   0     BP 116/59  Pulse 93  Temp 99.6 F (37.6 C) (Oral)  Resp 28  SpO2 96%  Physical Exam  Nursing note and vitals reviewed. Constitutional: He is oriented to person, place, and time. He appears well-developed and well-nourished. No distress.  Eyes: EOM are normal.  Neck: Normal range of motion. Neck supple. No thyromegaly present.  Cardiovascular: Normal rate, regular rhythm and normal heart sounds.   Pulmonary/Chest: No respiratory distress.       Lungs with scattered bilateral rhonchi and a few crackles in the left lower fields.  Musculoskeletal: He exhibits no edema and no tenderness.  Neurological: He is alert and oriented to person, place, and time.  Skin: Skin is warm and dry. No rash noted. There is erythema.  Psychiatric: He has a normal mood and affect.    ED Course  Procedures (including critical care time)  Labs Reviewed  POCT URINALYSIS DIP (DEVICE) - Abnormal; Notable for the following:    Hgb urine dipstick TRACE (*)     Protein, ur >=300 (*)     All other components within normal limits   Dg Chest 2 View  07/29/2012  *RADIOLOGY REPORT*  Clinical Data: Cough and fever.  CHEST - 2 VIEW  Comparison: 07/14/2012  Findings: Normal heart size.  Prior CABG.  No active infiltrates or failure.  No effusion or pneumothorax.  Bones unremarkable.  No change from priors.   IMPRESSION: No active cardiopulmonary disease.  Prior CABG.   Original Report Authenticated By: Davonna Belling, M.D.      1. Bronchitis   2. COPD (chronic obstructive pulmonary disease)       MDM  Z-Pak Albuterol HFA 2 puffs every 4 hours when necessary cough and wheeze Cheratussin 1 teaspoon every 4 hours when necessary cough For development of fever, shortness of breath, chest pain malaise or other symptoms go to the emergency department. Call your PCP tomorrow to obtain an appointment for followup next Thursday or Friday.        Hayden Rasmussen, NP 07/29/12 2138  Hayden Rasmussen, NP 07/29/12 2138

## 2012-07-29 NOTE — ED Notes (Addendum)
Per daughter: pt has had productive cough and runny nose x 3 days.  Over weekend family noticed he had poor appetite, was vomiting, sleeping a lot, ornery, and seemed a little off balance.  Patient admits to some dizziness.  Also c/o left side pain (denies at present time); denies rib pain or chest pain.  Daughter states no change in mentation or orientation from his baseline - is normally "a little off".  Started Keflex on 10/27 per records - patient and daughter unsure what for, but think it may have been for a UTI.  Shallow breathing noted; crackles noted in left base.

## 2012-07-29 NOTE — ED Notes (Signed)
Patient in restroom, with daughter assisting, to provide urine sample.

## 2012-07-29 NOTE — ED Notes (Signed)
Updated on wait.  PO fluids provided per request.

## 2012-07-29 NOTE — ED Notes (Signed)
Updated on wait.  Comfort measures provided.  Pt drank 6 oz water - water refilled.

## 2012-07-29 NOTE — ED Provider Notes (Signed)
History     CSN: [id number]  Arrival date & time 07/29/12  1805   None     Chief Complaint  Patient presents with  . Cough  . Fever    (Consider location/radiation/quality/duration/timing/severity/associated sxs/prior treatment) HPI  Past Medical History  Diagnosis Date  . Myocardial infarction   . CVA (cerebral infarction)   . Diabetes mellitus   . Hypertension   . Diabetes mellitus   . Aphasia   . Dysphagia   . COPD (chronic obstructive pulmonary disease)   . Shortness of breath   . Anemia   . Stroke   . Colon cancer     Past Surgical History  Procedure Date  . Coronary artery bypass graft   . Appendectomy   . Dental surgery   . Colonoscopy 03/13/2012    Procedure: COLONOSCOPY;  Surgeon: Hart Carwin, MD;  Location: [facility];  Service: Endoscopy;  Laterality: N/A;  . Partial colectomy 03/22/2012    Procedure: PARTIAL COLECTOMY;  Surgeon: Cherylynn Ridges, MD;  Location: [facility] OR;  Service: General;  Laterality: Right;  . Coronary artery bypass graft     x3  . Coronary stent placement     Family History  Problem Relation Age of Onset  . Breast cancer Mother   . Heart disease Brother   . Colon cancer Maternal Uncle     History  Substance Use Topics  . Smoking status: Former Smoker -- 16 years    Types: Cigarettes    Quit date: 12/27/2011  . Smokeless tobacco: Never Used  . Alcohol Use: No      Review of Systems  Allergies  Review of patient's allergies indicates no known allergies.  Home Medications   Current Outpatient Rx  Name  Route  Sig  Dispense  Refill  . ASPIRIN EC 81 MG PO TBEC   Oral   Take 81 mg by mouth daily. For CVA prophylaxis.         Marland Kitchen CLOPIDOGREL BISULFATE 75 MG PO TABS   Oral   Take 1 tablet (75 mg total) by mouth daily with breakfast.   30 tablet   3   . EZETIMIBE-SIMVASTATIN 10-40 MG PO TABS   Oral   Take 1 tablet by mouth at bedtime.         Marland Kitchen HYDROCODONE-ACETAMINOPHEN 5-325 MG PO TABS   Oral   Take 1  tablet by mouth every 6 (six) hours as needed. For pain.         Marland Kitchen POLYSACCHARIDE IRON COMPLEX 150 MG PO CAPS   Oral   Take 150 mg by mouth 2 (two) times daily.         Marland Kitchen METOPROLOL TARTRATE 12.5 MG HALF TABLET   Oral   Take 0.5 tablets (12.5 mg total) by mouth 2 (two) times daily.   30 tablet   3   . ADULT MULTIVITAMIN W/MINERALS CH   Oral   Take 1 tablet by mouth daily.         Marland Kitchen NITROGLYCERIN 0.4 MG/SPRAY TL SOLN   Sublingual   Place 1 spray under the tongue every 5 (five) minutes as needed. Chest pain. Every 5 minutes up to 3 doses.         Marland Kitchen OXAZEPAM 30 MG PO CAPS   Oral   Take 30 mg by mouth at bedtime.         Marland Kitchen PROMETHAZINE HCL 25 MG PO TABS   Oral   Take 25 mg by mouth  every 6 (six) hours as needed. For nausea.         Marland Kitchen SITAGLIPTIN-METFORMIN HCL 50-1000 MG PO TABS   Oral   Take 1 tablet by mouth 2 (two) times daily with a meal.   60 tablet   3   . TAMSULOSIN HCL 0.4 MG PO CAPS   Oral   Take 0.4 mg by mouth daily after breakfast.         . VITAMIN C 500 MG PO TABS   Oral   Take 500 mg by mouth 2 (two) times daily.         Marland Kitchen ZINC SULFATE 220 MG PO CAPS   Oral   Take 220 mg by mouth daily.         . ALBUTEROL SULFATE HFA 108 (90 BASE) MCG/ACT IN AERS   Inhalation   Inhale 1-2 puffs into the lungs every 6 (six) hours as needed for wheezing.   1 Inhaler   0   . AZITHROMYCIN 250 MG PO TABS   Oral   Take 1 tablet (250 mg total) by mouth daily. 2 tabs po on day 1, 1 tab po on days 2-5   6 tablet   0   . GUAIFENESIN-CODEINE 100-10 MG/5ML PO SYRP   Oral   Take 5 mLs by mouth 4 (four) times daily as needed for cough.   120 mL   0     BP 116/59  Pulse 93  Temp 99.6 F (37.6 C) (Oral)  Resp 22  SpO2 96%  Physical Exam  ED Course  Procedures (including critical care time)  Labs Reviewed  POCT URINALYSIS DIP (DEVICE) - Abnormal; Notable for the following:    Hgb urine dipstick TRACE (*)     Protein, ur >=300 (*)     All  other components within normal limits  LAB REPORT - SCANNED   Dg Chest 2 View  07/29/2012  *RADIOLOGY REPORT*  Clinical Data: Cough and fever.  CHEST - 2 VIEW  Comparison: 07/14/2012  Findings: Normal heart size.  Prior CABG.  No active infiltrates or failure.  No effusion or pneumothorax.  Bones unremarkable.  No change from priors.  IMPRESSION: No active cardiopulmonary disease.  Prior CABG.   Original Report Authenticated By: Davonna Belling, M.D.      1. Bronchitis   2. COPD (chronic obstructive pulmonary disease)       MDM          Linna Hoff, MD 07/30/12 [phone]

## 2012-07-29 NOTE — ED Notes (Signed)
Sherren Mocha, NP notified of patient status.

## 2012-07-30 ENCOUNTER — Ambulatory Visit: Payer: Medicare Other

## 2012-07-30 ENCOUNTER — Encounter: Payer: Self-pay | Admitting: Oncology

## 2012-07-30 ENCOUNTER — Telehealth: Payer: Self-pay | Admitting: Oncology

## 2012-07-30 ENCOUNTER — Ambulatory Visit (HOSPITAL_BASED_OUTPATIENT_CLINIC_OR_DEPARTMENT_OTHER): Payer: Medicare Other | Admitting: Oncology

## 2012-07-30 VITALS — BP 155/55 | HR 68 | Temp 97.3°F | Resp 20 | Ht 64.0 in | Wt 145.4 lb

## 2012-07-30 DIAGNOSIS — I2510 Atherosclerotic heart disease of native coronary artery without angina pectoris: Secondary | ICD-10-CM

## 2012-07-30 DIAGNOSIS — C182 Malignant neoplasm of ascending colon: Secondary | ICD-10-CM

## 2012-07-30 DIAGNOSIS — C189 Malignant neoplasm of colon, unspecified: Secondary | ICD-10-CM

## 2012-07-30 DIAGNOSIS — J449 Chronic obstructive pulmonary disease, unspecified: Secondary | ICD-10-CM

## 2012-07-30 NOTE — Telephone Encounter (Signed)
Called pt and he is on a recall list for a colon screening,pt aware.

## 2012-07-30 NOTE — Progress Notes (Signed)
   [facility]    OFFICE PROGRESS NOTE   INTERVAL HISTORY:   I saw him in the hospital in July after he was diagnosed with colon cancer. He did not return for scheduled outpatient followup.  He was seen in the emergency room yesterday with "bronchitis ". No fever or shortness of breath. He has a cough. He was prescribed an antibiotic. He reports a good appetite. No abdominal pain at present. No other specific complaint.  Objective:  Vital signs in last 24 hours:  Blood pressure 155/55, pulse 68, temperature 97.3 F (36.3 C), temperature source Oral, resp. rate 20, height 5\' 4"  (1.626 m), weight 145 lb 6.4 oz (65.953 kg).    HEENT: Neck without mass Lymphatics: No cervical, supraclavicular, axillary, or inguinal node Resp: Inspiratory rails/rhonchi at the posterior base bilaterally, no respiratory distress Cardio: Regular rate and rhythm GI: No hepatosplenomegaly, nontender, no mass Vascular: The left lower leg is slightly larger than the right side, no edema or erythema Neuro: Alert, expressive aphasia  Medications: I have reviewed the patient's current medications.  Assessment/Plan:  1. 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 History of iron deficiency anemia 3. Coronary artery disease 4. Status post a CVA in April of 2013 5. COPD 6. Diabetes 7. Hypertension 8. Multiple colon polyps on a colonoscopy 03/13/2012  Disposition:  Mr. [name] remains in clinical remission from colon cancer. He plans to continue followup with Dr. Doristine Counter. He will see Dr. Juanda Chance for a surveillance colonoscopy in June or July of next year. I discussed the diagnosis of colon cancer and the prognosis with Mr. Bushey and his family. He has a good prognosis for a long-term disease-free survival. He has multiple comorbid conditions. We did not schedule a followup appointment at the [facility]. I will see him in the  future as needed. We did not obtain a surveillance CEA. He has a good prognosis and would most likely not be a candidate for aggressive surgical interventions.   Thornton Papas, MD  07/30/2012  9:29 AM

## 2012-07-30 NOTE — Progress Notes (Signed)
Checked in new pt with no financial concerns. °

## 2012-07-30 NOTE — Progress Notes (Signed)
Met with patient and family, explained role of nurse navigator.  Resource information and contact numbers given to patient's daughter.  Encouraged them to call for needs or questions.  They denied the need for services at this time.

## 2012-08-01 NOTE — ED Provider Notes (Signed)
Medical screening examination/treatment/procedure(s) were performed by resident physician or non-physician practitioner and as supervising physician I was immediately available for consultation/collaboration.   Barkley Bruns MD.    Linna Hoff, MD 08/01/12 2114

## 2012-10-12 ENCOUNTER — Emergency Department (HOSPITAL_COMMUNITY): Payer: Medicare Other

## 2012-10-12 ENCOUNTER — Emergency Department (HOSPITAL_COMMUNITY)
Admission: EM | Admit: 2012-10-12 | Discharge: 2012-10-13 | Disposition: A | Discharge status: Home / Self Care | Payer: Medicare Other | Attending: Emergency Medicine | Admitting: Emergency Medicine

## 2012-10-12 ENCOUNTER — Encounter (HOSPITAL_COMMUNITY): Payer: Self-pay | Admitting: Nurse Practitioner

## 2012-10-12 DIAGNOSIS — I2510 Atherosclerotic heart disease of native coronary artery without angina pectoris: Secondary | ICD-10-CM | POA: Insufficient documentation

## 2012-10-12 DIAGNOSIS — Z8673 Personal history of transient ischemic attack (TIA), and cerebral infarction without residual deficits: Secondary | ICD-10-CM | POA: Insufficient documentation

## 2012-10-12 DIAGNOSIS — K5900 Constipation, unspecified: Secondary | ICD-10-CM | POA: Insufficient documentation

## 2012-10-12 DIAGNOSIS — E119 Type 2 diabetes mellitus without complications: Secondary | ICD-10-CM | POA: Insufficient documentation

## 2012-10-12 DIAGNOSIS — Z951 Presence of aortocoronary bypass graft: Secondary | ICD-10-CM | POA: Insufficient documentation

## 2012-10-12 DIAGNOSIS — R42 Dizziness and giddiness: Secondary | ICD-10-CM | POA: Insufficient documentation

## 2012-10-12 DIAGNOSIS — I10 Essential (primary) hypertension: Secondary | ICD-10-CM | POA: Insufficient documentation

## 2012-10-12 DIAGNOSIS — N200 Calculus of kidney: Secondary | ICD-10-CM

## 2012-10-12 DIAGNOSIS — J4489 Other specified chronic obstructive pulmonary disease: Secondary | ICD-10-CM | POA: Insufficient documentation

## 2012-10-12 DIAGNOSIS — Z79899 Other long term (current) drug therapy: Secondary | ICD-10-CM | POA: Insufficient documentation

## 2012-10-12 DIAGNOSIS — Z85038 Personal history of other malignant neoplasm of large intestine: Secondary | ICD-10-CM | POA: Insufficient documentation

## 2012-10-12 DIAGNOSIS — R1084 Generalized abdominal pain: Secondary | ICD-10-CM | POA: Insufficient documentation

## 2012-10-12 DIAGNOSIS — Z9861 Coronary angioplasty status: Secondary | ICD-10-CM | POA: Insufficient documentation

## 2012-10-12 DIAGNOSIS — J449 Chronic obstructive pulmonary disease, unspecified: Secondary | ICD-10-CM | POA: Insufficient documentation

## 2012-10-12 DIAGNOSIS — Z87891 Personal history of nicotine dependence: Secondary | ICD-10-CM | POA: Insufficient documentation

## 2012-10-12 DIAGNOSIS — K5904 Chronic idiopathic constipation: Secondary | ICD-10-CM

## 2012-10-12 DIAGNOSIS — Z7902 Long term (current) use of antithrombotics/antiplatelets: Secondary | ICD-10-CM | POA: Insufficient documentation

## 2012-10-12 DIAGNOSIS — I252 Old myocardial infarction: Secondary | ICD-10-CM | POA: Insufficient documentation

## 2012-10-12 DIAGNOSIS — R630 Anorexia: Secondary | ICD-10-CM | POA: Insufficient documentation

## 2012-10-12 DIAGNOSIS — Z7982 Long term (current) use of aspirin: Secondary | ICD-10-CM | POA: Insufficient documentation

## 2012-10-12 DIAGNOSIS — N209 Urinary calculus, unspecified: Secondary | ICD-10-CM

## 2012-10-12 DIAGNOSIS — Z862 Personal history of diseases of the blood and blood-forming organs and certain disorders involving the immune mechanism: Secondary | ICD-10-CM | POA: Insufficient documentation

## 2012-10-12 LAB — CBC WITH DIFFERENTIAL/PLATELET
Basophils Relative: 0 % (ref 0–1)
Eosinophils Absolute: 0.2 10*3/uL (ref 0.0–0.7)
Lymphs Abs: 1.6 10*3/uL (ref 0.7–4.0)
MCH: 29.1 pg (ref 26.0–34.0)
Neutro Abs: 5.7 10*3/uL (ref 1.7–7.7)
Neutrophils Relative %: 69 % (ref 43–77)
Platelets: 156 10*3/uL (ref 150–400)
RBC: 5.29 MIL/uL (ref 4.22–5.81)

## 2012-10-12 LAB — COMPREHENSIVE METABOLIC PANEL
ALT: 11 U/L (ref 0–53)
Albumin: 3.7 g/dL (ref 3.5–5.2)
Alkaline Phosphatase: 77 U/L (ref 39–117)
Glucose, Bld: 242 mg/dL — ABNORMAL HIGH (ref 70–99)
Potassium: 4.5 mEq/L (ref 3.5–5.1)
Sodium: 136 mEq/L (ref 135–145)
Total Protein: 7.4 g/dL (ref 6.0–8.3)

## 2012-10-12 MED ORDER — IOHEXOL 300 MG/ML  SOLN
20.0000 mL | INTRAMUSCULAR | Status: AC
  Administered 2012-10-12 (×2): 20 mL via ORAL

## 2012-10-12 MED ORDER — MORPHINE SULFATE 4 MG/ML IJ SOLN
8.0000 mg | Freq: Once | INTRAMUSCULAR | Status: AC
Start: 2012-10-12 — End: 2012-10-12
  Administered 2012-10-12: 8 mg via INTRAVENOUS
  Filled 2012-10-12: qty 2

## 2012-10-12 MED ORDER — SODIUM CHLORIDE 0.9 % IV SOLN
Freq: Once | INTRAVENOUS | Status: AC
  Administered 2012-10-12: 18:00:00 via INTRAVENOUS

## 2012-10-12 MED ORDER — ONDANSETRON HCL 4 MG/2ML IJ SOLN
4.0000 mg | Freq: Once | INTRAMUSCULAR | Status: AC
  Administered 2012-10-12: 4 mg via INTRAVENOUS
  Filled 2012-10-12: qty 2

## 2012-10-12 MED ORDER — IOHEXOL 300 MG/ML  SOLN
50.0000 mL | Freq: Once | INTRAMUSCULAR | Status: AC | PRN
  Administered 2012-10-12: 50 mL via ORAL

## 2012-10-12 MED ORDER — SODIUM CHLORIDE 0.9 % IV SOLN
INTRAVENOUS | Status: DC
  Administered 2012-10-12: 16:00:00 via INTRAVENOUS

## 2012-10-12 NOTE — ED Notes (Signed)
PT started PO contrast for CT, Still in FT 9  Advised him to drink as tolerated and let RN know when complete.

## 2012-10-12 NOTE — ED Notes (Signed)
IV attempted x2

## 2012-10-12 NOTE — ED Notes (Signed)
Pt went to radiology.

## 2012-10-12 NOTE — ED Notes (Signed)
Pt c/o rectal pain since waking this am. Denies bowel changes.

## 2012-10-12 NOTE — ED Provider Notes (Addendum)
History     CSN: [id number]  Arrival date & time 10/12/12  1454   First MD Initiated Contact with Patient 10/12/12 1514      Chief Complaint  Patient presents with  . Rectal Pain    (Consider location/radiation/quality/duration/timing/severity/associated sxs/prior treatment) HPI Comments: 72 yo male with history of colon cancer s/p right hemicolectomy, COPD, Htn, DM, CAD who presents with abdominal pain. The pain started yesterday and has not gotten any better or worse. The pain is located diffusely in the abdomen and he describes it as a dull pain without any radiation. He has not had a bowel movement or passed any gas since Sunday. He sometimes takes Miralax for constipation but hasn't used it recently. He also describes anorexia. He denies any N/V, fevers, dyspnea, or difficulty with urination. Review of systems is otherwise negative.  The history is provided by the patient.    Past Medical History  Diagnosis Date  . Myocardial infarction   . CVA (cerebral infarction)   . Diabetes mellitus   . Hypertension   . Diabetes mellitus   . Aphasia   . Dysphagia   . COPD (chronic obstructive pulmonary disease)   . Shortness of breath   . Anemia   . Stroke   . Colon cancer     Past Surgical History  Procedure Date  . Coronary artery bypass graft   . Appendectomy   . Dental surgery   . Colonoscopy 03/13/2012    Procedure: COLONOSCOPY;  Surgeon: Hart Carwin, MD;  Location: [facility];  Service: Endoscopy;  Laterality: N/A;  . Partial colectomy 03/22/2012    Procedure: PARTIAL COLECTOMY;  Surgeon: Cherylynn Ridges, MD;  Location: [facility] OR;  Service: General;  Laterality: Right;  . Coronary artery bypass graft     x3  . Coronary stent placement     Family History  Problem Relation Age of Onset  . Breast cancer Mother   . Heart disease Brother   . Colon cancer Maternal Uncle     History  Substance Use Topics  . Smoking status: Former Smoker -- 16 years    Types: Cigarettes      Quit date: 12/27/2011  . Smokeless tobacco: Never Used  . Alcohol Use: No      Review of Systems  Constitutional: Negative for fever, chills and activity change.  HENT: Negative for neck pain.   Eyes: Negative for visual disturbance.  Respiratory: Negative for cough, chest tightness and shortness of breath.   Cardiovascular: Negative for chest pain.  Gastrointestinal: Positive for abdominal pain, constipation and abdominal distention. Negative for nausea, vomiting, diarrhea, blood in stool and rectal pain.  Genitourinary: Negative for dysuria, enuresis and difficulty urinating.  Musculoskeletal: Negative for arthralgias.  Neurological: Positive for dizziness. Negative for light-headedness and headaches.  Psychiatric/Behavioral: Negative for confusion.    Allergies  Review of patient's allergies indicates no known allergies.  Home Medications   Current Outpatient Rx  Name  Route  Sig  Dispense  Refill  . ASPIRIN EC 81 MG PO TBEC   Oral   Take 81 mg by mouth daily. For CVA prophylaxis.         Marland Kitchen CLOPIDOGREL BISULFATE 75 MG PO TABS   Oral   Take 1 tablet (75 mg total) by mouth daily with breakfast.   30 tablet   3   . EZETIMIBE-SIMVASTATIN 10-40 MG PO TABS   Oral   Take 1 tablet by mouth at bedtime.         Marland Kitchen  HYDROCODONE-ACETAMINOPHEN 5-325 MG PO TABS   Oral   Take 1 tablet by mouth every 6 (six) hours as needed. For pain.         Marland Kitchen POLYSACCHARIDE IRON COMPLEX 150 MG PO CAPS   Oral   Take 150 mg by mouth 2 (two) times daily.         Marland Kitchen METOPROLOL TARTRATE 12.5 MG HALF TABLET   Oral   Take 0.5 tablets (12.5 mg total) by mouth 2 (two) times daily.   30 tablet   3   . ADULT MULTIVITAMIN W/MINERALS CH   Oral   Take 1 tablet by mouth daily.         Marland Kitchen OXAZEPAM 30 MG PO CAPS   Oral   Take 30 mg by mouth at bedtime.         Marland Kitchen PROMETHAZINE HCL 25 MG PO TABS   Oral   Take 25 mg by mouth every 6 (six) hours as needed. For nausea.         Marland Kitchen  SITAGLIPTIN-METFORMIN HCL 50-1000 MG PO TABS   Oral   Take 1 tablet by mouth 2 (two) times daily with a meal.   60 tablet   3   . TAMSULOSIN HCL 0.4 MG PO CAPS   Oral   Take 0.4 mg by mouth daily after breakfast.         . VITAMIN C 500 MG PO TABS   Oral   Take 500 mg by mouth 2 (two) times daily.         Marland Kitchen ZINC SULFATE 220 MG PO CAPS   Oral   Take 220 mg by mouth daily.         . ALBUTEROL SULFATE HFA 108 (90 BASE) MCG/ACT IN AERS   Inhalation   Inhale 1-2 puffs into the lungs every 6 (six) hours as needed for wheezing.   1 Inhaler   0   . NITROGLYCERIN 0.4 MG/SPRAY TL SOLN   Sublingual   Place 1 spray under the tongue every 5 (five) minutes as needed. Chest pain. Every 5 minutes up to 3 doses.           BP 166/69  Pulse 101  Temp 97.8 F (36.6 C) (Oral)  Resp 20  SpO2 97%  Physical Exam  Nursing note and vitals reviewed. Constitutional: He is oriented to person, place, and time. He appears well-developed.  HENT:  Head: Normocephalic and atraumatic.  Eyes: Conjunctivae normal and EOM are normal. Pupils are equal, round, and reactive to light.  Neck: Normal range of motion. Neck supple.  Cardiovascular: Normal rate, regular rhythm and normal heart sounds.   Pulmonary/Chest: Effort normal and breath sounds normal. No respiratory distress. He has no wheezes.  Abdominal: Soft. Bowel sounds are normal. He exhibits distension. There is tenderness. There is no rebound and no guarding.       Abdomen is distended, dull to percussion. Pt has tenderness over the left upper and lower quadrants. No rebound or guarding.  Neurological: He is alert and oriented to person, place, and time.  Skin: Skin is warm.    ED Course  Procedures (including critical care time)  Labs Reviewed  CBC WITH DIFFERENTIAL - Abnormal; Notable for the following:    MCHC 36.1 (*)     All other components within normal limits  COMPREHENSIVE METABOLIC PANEL - Abnormal; Notable for the  following:    Glucose, Bld 242 (*)     Total Bilirubin 0.2 (*)  GFR calc non Af Amer 64 (*)     GFR calc Af Amer 74 (*)     All other components within normal limits   No results found.   No diagnosis found.    MDM  . Date: 10/12/2012  Rate: 77  Rhythm: normal sinus rhythm  QRS Axis: normal  Intervals: normal  ST/T Wave abnormalities: normal  Conduction Disutrbances:none  Narrative Interpretation:   Old EKG Reviewed: none available  DDx includes: Pancreatitis Hepatobiliary pathology including cholecystitis Gastritis/PUD SBO AAA Tumors Colitis Intra abdominal abscess Thrombosis Mesenteric ischemia Diverticulitis Peritonitis Appendicitis Hernia Nephrolithiasis Pyelonephritis UTI/Cystitis  Pt comes in with cc of abd pain. No BM or flatus in a week. Initial impression is that patient has SBO, ileus or some mechanical issue. We will start with AAS and reassess for further imaging.   Derwood Kaplan, MD 10/12/12 1738  CT ordered due to equivocal AAS. Pt has some renal stones, stone in the bladder, bladder wall edema Will give urology f.u. Enema provided. No sBO/ILEUs as suspected.  Derwood Kaplan, MD 10/13/12 0006

## 2012-10-12 NOTE — ED Provider Notes (Signed)
This chart was scribed for non-physician practitioner working with Derwood Kaplan, MD by Leone Payor, ED Scribe. This patient was seen in room A05C/A05C and the patient's care was started at 1454.    MSE Visit; [name] is a 72 y.o. male who presents to the Emergency Department complaining of constant, unchanged rectal pain that started upon waking this morning. Pt presents with h/o stage 2 colon cancer (last coloscopy performed by Dr. Dickie La, colectomy 03/22/2012 performed by Dr. Lindie Spruce). Due to pt's history of high blood pressure, mild tachycardia it is felt that a further workup is indicated. Pt will be moved to a POD for further evaluation discussed with nurse first who is aware.   On appearance pt does not appear to be in any acute distress. Seems stable but states he is in pain and is not able to sit down and stay still. He has associated abdominal pain but denies vomiting, nausea. Pt is not undergoing any chemotherapy at this time.   DIAGNOSTIC STUDIES: Oxygen Saturation is 99% on room air, normal by my interpretation.    COORDINATION OF CARE:  1452 : Ordered IV fluids, pain medications. Preliminary labs are ordered and imaging studies pending.    I personally performed the services described in this documentation, which was scribed in my presence. The recorded information has been reviewed and is accurate.        Jaci Carrel, [place] 10/12/12 1624

## 2012-10-13 MED ORDER — MINERAL OIL RE ENEM: 1.0000 | ENEMA | Freq: Once | RECTAL | Status: DC

## 2012-10-13 MED ORDER — HYDROCODONE-ACETAMINOPHEN 5-325 MG PO TABS: 1.0000 | ORAL_TABLET | Freq: Four times a day (QID) | ORAL | Status: DC | PRN

## 2012-10-13 MED ORDER — POLYETHYLENE GLYCOL 3350 17 G PO PACK: 17.0000 g | PACK | Freq: Every day | ORAL | Status: DC

## 2012-10-13 MED ORDER — TAMSULOSIN HCL 0.4 MG PO CAPS: 0.4000 mg | ORAL_CAPSULE | Freq: Every day | ORAL | Status: DC

## 2012-10-13 MED ORDER — IBUPROFEN 600 MG PO TABS: 600.0000 mg | ORAL_TABLET | Freq: Four times a day (QID) | ORAL | Status: DC | PRN

## 2012-10-13 NOTE — ED Provider Notes (Signed)
Medical screening examination/treatment/procedure(s) were performed by non-physician practitioner and as supervising physician I was immediately available for consultation/collaboration.  Aleksa Collinsworth, MD 10/13/12 1604 

## 2012-10-24 ENCOUNTER — Encounter (HOSPITAL_COMMUNITY): Payer: Self-pay | Admitting: Emergency Medicine

## 2012-10-24 DIAGNOSIS — K649 Unspecified hemorrhoids: Secondary | ICD-10-CM | POA: Insufficient documentation

## 2012-10-24 DIAGNOSIS — Z87891 Personal history of nicotine dependence: Secondary | ICD-10-CM | POA: Insufficient documentation

## 2012-10-24 DIAGNOSIS — R569 Unspecified convulsions: Secondary | ICD-10-CM | POA: Insufficient documentation

## 2012-10-24 LAB — CBC WITH DIFFERENTIAL/PLATELET
Basophils Relative: 0 % (ref 0–1)
Eosinophils Absolute: 0.2 10*3/uL (ref 0.0–0.7)
Hemoglobin: 14.6 g/dL (ref 13.0–17.0)
Lymphs Abs: 2.2 10*3/uL (ref 0.7–4.0)
MCHC: 34.8 g/dL (ref 30.0–36.0)
Monocytes Relative: 8 % (ref 3–12)
Neutro Abs: 5.4 10*3/uL (ref 1.7–7.7)
Neutrophils Relative %: 63 % (ref 43–77)
Platelets: 165 10*3/uL (ref 150–400)
RBC: 5.19 MIL/uL (ref 4.22–5.81)

## 2012-10-24 LAB — COMPREHENSIVE METABOLIC PANEL
ALT: 10 U/L (ref 0–53)
Albumin: 3.7 g/dL (ref 3.5–5.2)
Alkaline Phosphatase: 80 U/L (ref 39–117)
BUN: 21 mg/dL (ref 6–23)
Chloride: 101 mEq/L (ref 96–112)
Potassium: 4.2 mEq/L (ref 3.5–5.1)
Sodium: 137 mEq/L (ref 135–145)
Total Bilirubin: 0.2 mg/dL — ABNORMAL LOW (ref 0.3–1.2)
Total Protein: 7.5 g/dL (ref 6.0–8.3)

## 2012-10-24 NOTE — ED Notes (Addendum)
PT. REPORTS PAIN AT HEMORRHOIDS ONSET TODAY , NO BLEEDING . FAMILY ALSO REPORTED THAT PT. HAD 1 EPISODE OF SEIZURE THIS EVENING.

## 2012-10-25 ENCOUNTER — Emergency Department (HOSPITAL_COMMUNITY): Payer: Medicare Other

## 2012-10-25 ENCOUNTER — Observation Stay (HOSPITAL_COMMUNITY)
Admission: EM | Admit: 2012-10-25 | Discharge: 2012-10-27 | Disposition: A | Discharge status: Home / Self Care | Payer: Medicare Other | Attending: Internal Medicine | Admitting: Internal Medicine

## 2012-10-25 ENCOUNTER — Observation Stay (HOSPITAL_COMMUNITY): Payer: Medicare Other

## 2012-10-25 ENCOUNTER — Emergency Department (HOSPITAL_COMMUNITY)
Admission: EM | Admit: 2012-10-25 | Discharge: 2012-10-25 | Discharge status: Against Medical Advice | Payer: Medicare Other | Attending: Emergency Medicine | Admitting: Emergency Medicine

## 2012-10-25 ENCOUNTER — Encounter (HOSPITAL_COMMUNITY): Payer: Self-pay | Admitting: *Deleted

## 2012-10-25 ENCOUNTER — Ambulatory Visit (HOSPITAL_COMMUNITY): Payer: Medicare Other

## 2012-10-25 DIAGNOSIS — R599 Enlarged lymph nodes, unspecified: Secondary | ICD-10-CM

## 2012-10-25 DIAGNOSIS — I639 Cerebral infarction, unspecified: Secondary | ICD-10-CM

## 2012-10-25 DIAGNOSIS — R195 Other fecal abnormalities: Secondary | ICD-10-CM

## 2012-10-25 DIAGNOSIS — K5900 Constipation, unspecified: Secondary | ICD-10-CM

## 2012-10-25 DIAGNOSIS — J449 Chronic obstructive pulmonary disease, unspecified: Secondary | ICD-10-CM

## 2012-10-25 DIAGNOSIS — D490 Neoplasm of unspecified behavior of digestive system: Secondary | ICD-10-CM

## 2012-10-25 DIAGNOSIS — E119 Type 2 diabetes mellitus without complications: Secondary | ICD-10-CM

## 2012-10-25 DIAGNOSIS — E1165 Type 2 diabetes mellitus with hyperglycemia: Secondary | ICD-10-CM | POA: Diagnosis present

## 2012-10-25 DIAGNOSIS — I2510 Atherosclerotic heart disease of native coronary artery without angina pectoris: Secondary | ICD-10-CM

## 2012-10-25 DIAGNOSIS — R569 Unspecified convulsions: Secondary | ICD-10-CM

## 2012-10-25 DIAGNOSIS — Z8673 Personal history of transient ischemic attack (TIA), and cerebral infarction without residual deficits: Secondary | ICD-10-CM

## 2012-10-25 DIAGNOSIS — R079 Chest pain, unspecified: Secondary | ICD-10-CM

## 2012-10-25 DIAGNOSIS — R109 Unspecified abdominal pain: Secondary | ICD-10-CM

## 2012-10-25 DIAGNOSIS — J4489 Other specified chronic obstructive pulmonary disease: Secondary | ICD-10-CM | POA: Insufficient documentation

## 2012-10-25 DIAGNOSIS — IMO0002 Reserved for concepts with insufficient information to code with codable children: Secondary | ICD-10-CM

## 2012-10-25 DIAGNOSIS — N179 Acute kidney failure, unspecified: Secondary | ICD-10-CM

## 2012-10-25 DIAGNOSIS — E118 Type 2 diabetes mellitus with unspecified complications: Secondary | ICD-10-CM | POA: Insufficient documentation

## 2012-10-25 DIAGNOSIS — R0789 Other chest pain: Principal | ICD-10-CM

## 2012-10-25 DIAGNOSIS — R4182 Altered mental status, unspecified: Secondary | ICD-10-CM

## 2012-10-25 DIAGNOSIS — R4701 Aphasia: Secondary | ICD-10-CM

## 2012-10-25 DIAGNOSIS — E861 Hypovolemia: Secondary | ICD-10-CM

## 2012-10-25 DIAGNOSIS — I10 Essential (primary) hypertension: Secondary | ICD-10-CM

## 2012-10-25 DIAGNOSIS — D649 Anemia, unspecified: Secondary | ICD-10-CM

## 2012-10-25 DIAGNOSIS — E860 Dehydration: Secondary | ICD-10-CM

## 2012-10-25 DIAGNOSIS — I6529 Occlusion and stenosis of unspecified carotid artery: Secondary | ICD-10-CM

## 2012-10-25 DIAGNOSIS — D126 Benign neoplasm of colon, unspecified: Secondary | ICD-10-CM

## 2012-10-25 DIAGNOSIS — R933 Abnormal findings on diagnostic imaging of other parts of digestive tract: Secondary | ICD-10-CM

## 2012-10-25 DIAGNOSIS — E875 Hyperkalemia: Secondary | ICD-10-CM

## 2012-10-25 DIAGNOSIS — Z85038 Personal history of other malignant neoplasm of large intestine: Secondary | ICD-10-CM

## 2012-10-25 HISTORY — DX: Unspecified hemorrhoids: K64.9

## 2012-10-25 LAB — CBC WITH DIFFERENTIAL/PLATELET
Basophils Absolute: 0 10*3/uL (ref 0.0–0.1)
Basophils Relative: 0 % (ref 0–1)
Eosinophils Absolute: 0.2 10*3/uL (ref 0.0–0.7)
Eosinophils Relative: 2 % (ref 0–5)
HCT: 38.3 % — ABNORMAL LOW (ref 39.0–52.0)
Hemoglobin: 13.4 g/dL (ref 13.0–17.0)
MCH: 28.3 pg (ref 26.0–34.0)
MCHC: 35 g/dL (ref 30.0–36.0)
MCV: 81 fL (ref 78.0–100.0)
Monocytes Absolute: 0.8 10*3/uL (ref 0.1–1.0)
Monocytes Relative: 7 % (ref 3–12)
RDW: 13.3 % (ref 11.5–15.5)

## 2012-10-25 LAB — CBC
HCT: 40 % (ref 39.0–52.0)
Hemoglobin: 13.6 g/dL (ref 13.0–17.0)
MCH: 27.3 pg (ref 26.0–34.0)
MCHC: 34 g/dL (ref 30.0–36.0)
MCV: 80.3 fL (ref 78.0–100.0)
Platelets: 156 K/uL (ref 150–400)
RBC: 4.98 MIL/uL (ref 4.22–5.81)
RDW: 13.2 % (ref 11.5–15.5)
WBC: 8.5 K/uL (ref 4.0–10.5)

## 2012-10-25 LAB — GLUCOSE, CAPILLARY
Glucose-Capillary: 177 mg/dL — ABNORMAL HIGH (ref 70–99)
Glucose-Capillary: 198 mg/dL — ABNORMAL HIGH (ref 70–99)
Glucose-Capillary: 149 mg/dL — ABNORMAL HIGH (ref 70–99)

## 2012-10-25 LAB — COMPREHENSIVE METABOLIC PANEL
AST: 12 U/L (ref 0–37)
Albumin: 3.1 g/dL — ABNORMAL LOW (ref 3.5–5.2)
BUN: 20 mg/dL (ref 6–23)
Calcium: 9.2 mg/dL (ref 8.4–10.5)
Chloride: 104 mEq/L (ref 96–112)
Creatinine, Ser: 1.09 mg/dL (ref 0.50–1.35)
Total Bilirubin: 0.2 mg/dL — ABNORMAL LOW (ref 0.3–1.2)
Total Protein: 6.4 g/dL (ref 6.0–8.3)

## 2012-10-25 LAB — TROPONIN I
Troponin I: <0.3 ng/mL (ref <0.30)
Troponin I: <0.3 ng/mL (ref <0.30)
Troponin I: <0.3 ng/mL (ref <0.30)

## 2012-10-25 LAB — LIPASE, BLOOD: Lipase: 30 U/L (ref 11–59)

## 2012-10-25 LAB — CREATININE, SERUM
GFR calc Af Amer: 76 mL/min — ABNORMAL LOW (ref >90)
GFR calc non Af Amer: 65 mL/min — ABNORMAL LOW (ref >90)

## 2012-10-25 LAB — HEMOGLOBIN A1C: Mean Plasma Glucose: 148 mg/dL — ABNORMAL HIGH (ref <117)

## 2012-10-25 LAB — OCCULT BLOOD, POC DEVICE: Fecal Occult Bld: NEGATIVE

## 2012-10-25 MED ORDER — PANTOPRAZOLE SODIUM 40 MG PO TBEC
40.0000 mg | DELAYED_RELEASE_TABLET | Freq: Every day | ORAL | Status: DC
  Administered 2012-10-25 – 2012-10-27 (×3): 40 mg via ORAL
  Filled 2012-10-25 (×3): qty 1

## 2012-10-25 MED ORDER — ACETAMINOPHEN 650 MG RE SUPP: 650.0000 mg | Freq: Four times a day (QID) | RECTAL | Status: DC | PRN

## 2012-10-25 MED ORDER — SODIUM CHLORIDE 0.9 % IV SOLN: INTRAVENOUS | Status: DC

## 2012-10-25 MED ORDER — SENNOSIDES-DOCUSATE SODIUM 8.6-50 MG PO TABS
2.0000 | ORAL_TABLET | Freq: Two times a day (BID) | ORAL | Status: DC
  Administered 2012-10-25 – 2012-10-27 (×3): 2 via ORAL
  Filled 2012-10-25 (×6): qty 2

## 2012-10-25 MED ORDER — ASPIRIN EC 81 MG PO TBEC: 81.0000 mg | DELAYED_RELEASE_TABLET | Freq: Every day | ORAL | Status: DC

## 2012-10-25 MED ORDER — MORPHINE SULFATE 4 MG/ML IJ SOLN
4.0000 mg | Freq: Once | INTRAMUSCULAR | Status: AC
  Administered 2012-10-25: 4 mg via INTRAVENOUS
  Filled 2012-10-25: qty 1

## 2012-10-25 MED ORDER — BISACODYL 5 MG PO TBEC
10.0000 mg | DELAYED_RELEASE_TABLET | Freq: Every day | ORAL | Status: DC | PRN
  Filled 2012-10-25: qty 2

## 2012-10-25 MED ORDER — HYDROCODONE-ACETAMINOPHEN 5-325 MG PO TABS
1.0000 | ORAL_TABLET | ORAL | Status: DC | PRN
  Administered 2012-10-25: 2 via ORAL
  Filled 2012-10-25: qty 2

## 2012-10-25 MED ORDER — IRBESARTAN 75 MG PO TABS
75.0000 mg | ORAL_TABLET | Freq: Every day | ORAL | Status: DC
  Administered 2012-10-25 – 2012-10-27 (×3): 75 mg via ORAL
  Filled 2012-10-25 (×3): qty 1

## 2012-10-25 MED ORDER — ACETAMINOPHEN 325 MG PO TABS: 650.0000 mg | ORAL_TABLET | Freq: Four times a day (QID) | ORAL | Status: DC | PRN

## 2012-10-25 MED ORDER — SITAGLIPTIN PHOS-METFORMIN HCL 50-1000 MG PO TABS: 1.0000 | ORAL_TABLET | Freq: Two times a day (BID) | ORAL | Status: DC

## 2012-10-25 MED ORDER — BISACODYL 5 MG PO TBEC
10.0000 mg | DELAYED_RELEASE_TABLET | Freq: Two times a day (BID) | ORAL | Status: DC
  Administered 2012-10-25 – 2012-10-27 (×2): 10 mg via ORAL
  Filled 2012-10-25 (×5): qty 2

## 2012-10-25 MED ORDER — SODIUM CHLORIDE 0.9 % IV SOLN: INTRAVENOUS | Status: AC

## 2012-10-25 MED ORDER — ALBUTEROL SULFATE HFA 108 (90 BASE) MCG/ACT IN AERS
1.0000 | INHALATION_SPRAY | Freq: Four times a day (QID) | RESPIRATORY_TRACT | Status: DC | PRN
  Filled 2012-10-25: qty 6.7

## 2012-10-25 MED ORDER — ONDANSETRON HCL 4 MG/2ML IJ SOLN: 4.0000 mg | Freq: Three times a day (TID) | INTRAMUSCULAR | Status: AC | PRN

## 2012-10-25 MED ORDER — POLYETHYLENE GLYCOL 3350 17 G PO PACK
17.0000 g | PACK | Freq: Every day | ORAL | Status: DC
  Administered 2012-10-25: 17 g via ORAL
  Filled 2012-10-25: qty 1

## 2012-10-25 MED ORDER — ESCITALOPRAM OXALATE 10 MG PO TABS
10.0000 mg | ORAL_TABLET | Freq: Every day | ORAL | Status: DC
  Administered 2012-10-25 – 2012-10-27 (×3): 10 mg via ORAL
  Filled 2012-10-25 (×3): qty 1

## 2012-10-25 MED ORDER — NITROGLYCERIN 0.4 MG SL SUBL: 0.4000 mg | SUBLINGUAL_TABLET | SUBLINGUAL | Status: DC | PRN

## 2012-10-25 MED ORDER — INSULIN ASPART 100 UNIT/ML ~~LOC~~ SOLN
0.0000 [IU] | Freq: Three times a day (TID) | SUBCUTANEOUS | Status: DC
  Administered 2012-10-25 (×2): 2 [IU] via SUBCUTANEOUS
  Administered 2012-10-26 – 2012-10-27 (×3): 1 [IU] via SUBCUTANEOUS

## 2012-10-25 MED ORDER — METFORMIN HCL 500 MG PO TABS
1000.0000 mg | ORAL_TABLET | Freq: Two times a day (BID) | ORAL | Status: DC
  Administered 2012-10-25 – 2012-10-27 (×4): 1000 mg via ORAL
  Filled 2012-10-25 (×7): qty 2

## 2012-10-25 MED ORDER — SODIUM CHLORIDE 0.9 % IJ SOLN
3.0000 mL | Freq: Two times a day (BID) | INTRAMUSCULAR | Status: DC
  Administered 2012-10-26: 3 mL via INTRAVENOUS

## 2012-10-25 MED ORDER — ASPIRIN EC 81 MG PO TBEC
81.0000 mg | DELAYED_RELEASE_TABLET | Freq: Every day | ORAL | Status: DC
  Administered 2012-10-25 – 2012-10-27 (×3): 81 mg via ORAL
  Filled 2012-10-25 (×3): qty 1

## 2012-10-25 MED ORDER — ENOXAPARIN SODIUM 40 MG/0.4ML ~~LOC~~ SOLN
40.0000 mg | SUBCUTANEOUS | Status: DC
  Administered 2012-10-25 – 2012-10-26 (×2): 40 mg via SUBCUTANEOUS
  Filled 2012-10-25 (×3): qty 0.4

## 2012-10-25 MED ORDER — SODIUM CHLORIDE 0.9 % IV BOLUS (SEPSIS)
1000.0000 mL | Freq: Once | INTRAVENOUS | Status: AC
  Administered 2012-10-25: 1000 mL via INTRAVENOUS

## 2012-10-25 MED ORDER — MORPHINE SULFATE 2 MG/ML IJ SOLN: 2.0000 mg | INTRAMUSCULAR | Status: DC | PRN

## 2012-10-25 MED ORDER — METOPROLOL TARTRATE 12.5 MG HALF TABLET
12.5000 mg | ORAL_TABLET | Freq: Two times a day (BID) | ORAL | Status: DC
  Administered 2012-10-26 – 2012-10-27 (×3): 12.5 mg via ORAL
  Filled 2012-10-25 (×5): qty 1

## 2012-10-25 MED ORDER — POLYETHYLENE GLYCOL 3350 17 G PO PACK
17.0000 g | PACK | Freq: Two times a day (BID) | ORAL | Status: DC
  Administered 2012-10-26 – 2012-10-27 (×2): 17 g via ORAL
  Filled 2012-10-25 (×5): qty 1

## 2012-10-25 MED ORDER — CLOPIDOGREL BISULFATE 75 MG PO TABS
75.0000 mg | ORAL_TABLET | Freq: Every day | ORAL | Status: DC
  Administered 2012-10-25 – 2012-10-27 (×3): 75 mg via ORAL
  Filled 2012-10-25 (×3): qty 1

## 2012-10-25 MED ORDER — LINAGLIPTIN 5 MG PO TABS
5.0000 mg | ORAL_TABLET | Freq: Every day | ORAL | Status: DC
  Administered 2012-10-25 – 2012-10-27 (×3): 5 mg via ORAL
  Filled 2012-10-25 (×3): qty 1

## 2012-10-25 MED ORDER — SODIUM CHLORIDE 0.9 % IV SOLN
INTRAVENOUS | Status: DC
  Administered 2012-10-25 (×2): via INTRAVENOUS

## 2012-10-25 MED ORDER — MAGNESIUM HYDROXIDE 400 MG/5ML PO SUSP
30.0000 mL | Freq: Once | ORAL | Status: AC
  Administered 2012-10-25: 30 mL via ORAL
  Filled 2012-10-25: qty 30

## 2012-10-25 MED ORDER — STARCH (THICKENING) PO POWD
ORAL | Status: DC | PRN
  Filled 2012-10-25: qty 227

## 2012-10-25 NOTE — ED Notes (Signed)
Patient returning from xray at this time, patient in NAD at this time,

## 2012-10-25 NOTE — ED Provider Notes (Signed)
History     CSN: [id number]  Arrival date & time 10/25/12  4098   First MD Initiated Contact with Patient 10/25/12 0559      No chief complaint on file.   (Consider location/radiation/quality/duration/timing/severity/associated sxs/prior treatment) HPI  72 yo male with history of colon cancer s/p right hemicolectomy, COPD, Htn, DM, CAD presents with vague complaints of chest wall pain, indigestion, abdominal pain, and left hip pain. History was limited due to patient's speech impediment. Patient reports he developed a gradual onset of achy pain to his upper abdomen since 8:00 last night. Pain is been persistent, and is getting progressively worse. Pain is primarily to his abdomen and left hip, nonradiating. He endorsed nausea without vomiting or diarrhea. He reports his last bowel movement was 2 weeks ago. Reports having the urge but inability to produce movement. He denies fever, chills, cough, shortness of breath, diaphoresis, or dysuria. EMS initially chart that patient has chest wall pain which was reproducible by EMS. Patient was also given aspirin, nitroglycerin, and Zofran which provide no relief. Patient reports this pain felt different from prior MI.  Patient was seen in the ED a week ago for same complaint. He had an abdominal CT scan without contrast which shows nonspecific stranding of the bladder and moderate amount of stool without evidence of SBO, or ileus. Patient was discharged with MiraLAX, mineral oil enema. He reports he took MiraLAX once, however symptom has been progressively worse.   Past Medical History  Diagnosis Date  . Myocardial infarction   . CVA (cerebral infarction)   . Diabetes mellitus   . Hypertension   . Diabetes mellitus   . Aphasia   . Dysphagia   . COPD (chronic obstructive pulmonary disease)   . Shortness of breath   . Anemia   . Stroke   . Colon cancer   . Hemorrhoid     Past Surgical History  Procedure Date  . Coronary artery bypass graft    . Appendectomy   . Dental surgery   . Colonoscopy 03/13/2012    Procedure: COLONOSCOPY;  Surgeon: Hart Carwin, MD;  Location: [facility];  Service: Endoscopy;  Laterality: N/A;  . Partial colectomy 03/22/2012    Procedure: PARTIAL COLECTOMY;  Surgeon: Cherylynn Ridges, MD;  Location: [facility] OR;  Service: General;  Laterality: Right;  . Coronary artery bypass graft     x3  . Coronary stent placement     Family History  Problem Relation Age of Onset  . Breast cancer Mother   . Heart disease Brother   . Colon cancer Maternal Uncle     History  Substance Use Topics  . Smoking status: Former Smoker -- 16 years    Types: Cigarettes    Quit date: 12/27/2011  . Smokeless tobacco: Never Used  . Alcohol Use: No      Review of Systems  Constitutional:       A complete 10 system review of systems was obtained and all systems are negative except as noted in the HPI and PMH.    Allergies  Review of patient's allergies indicates no known allergies.  Home Medications   Current Outpatient Rx  Name  Route  Sig  Dispense  Refill  . ALBUTEROL SULFATE HFA 108 (90 BASE) MCG/ACT IN AERS   Inhalation   Inhale 1-2 puffs into the lungs every 6 (six) hours as needed for wheezing.   1 Inhaler   0   . ASPIRIN EC 81 MG PO TBEC  Oral   Take 81 mg by mouth daily. For CVA prophylaxis.         Marland Kitchen CLOPIDOGREL BISULFATE 75 MG PO TABS   Oral   Take 1 tablet (75 mg total) by mouth daily with breakfast.   30 tablet   3   . EZETIMIBE-SIMVASTATIN 10-40 MG PO TABS   Oral   Take 1 tablet by mouth at bedtime.         Marland Kitchen HYDROCODONE-ACETAMINOPHEN 5-325 MG PO TABS   Oral   Take 1 tablet by mouth every 6 (six) hours as needed. For pain.         Marland Kitchen HYDROCODONE-ACETAMINOPHEN 5-325 MG PO TABS   Oral   Take 1 tablet by mouth every 6 (six) hours as needed for pain.   15 tablet   0   . IBUPROFEN 600 MG PO TABS   Oral   Take 1 tablet (600 mg total) by mouth every 6 (six) hours as needed for  pain.   30 tablet   0   . POLYSACCHARIDE IRON COMPLEX 150 MG PO CAPS   Oral   Take 150 mg by mouth 2 (two) times daily.         Marland Kitchen METOPROLOL TARTRATE 12.5 MG HALF TABLET   Oral   Take 0.5 tablets (12.5 mg total) by mouth 2 (two) times daily.   30 tablet   3   . MINERAL OIL RE ENEM   Rectal   Place 1 enema rectally once.   133 mL   0   . ADULT MULTIVITAMIN W/MINERALS CH   Oral   Take 1 tablet by mouth daily.         Marland Kitchen NITROGLYCERIN 0.4 MG/SPRAY TL SOLN   Sublingual   Place 1 spray under the tongue every 5 (five) minutes as needed. Chest pain. Every 5 minutes up to 3 doses.         Marland Kitchen OXAZEPAM 30 MG PO CAPS   Oral   Take 30 mg by mouth at bedtime.         Marland Kitchen POLYETHYLENE GLYCOL 3350 PO PACK   Oral   Take 17 g by mouth daily.   14 each   0   . PROMETHAZINE HCL 25 MG PO TABS   Oral   Take 25 mg by mouth every 6 (six) hours as needed. For nausea.         Marland Kitchen SITAGLIPTIN-METFORMIN HCL 50-1000 MG PO TABS   Oral   Take 1 tablet by mouth 2 (two) times daily with a meal.   60 tablet   3   . TAMSULOSIN HCL 0.4 MG PO CAPS   Oral   Take 0.4 mg by mouth daily after breakfast.         . TAMSULOSIN HCL 0.4 MG PO CAPS   Oral   Take 1 capsule (0.4 mg total) by mouth daily.   10 capsule   0   . VITAMIN C 500 MG PO TABS   Oral   Take 500 mg by mouth 2 (two) times daily.         Marland Kitchen ZINC SULFATE 220 MG PO CAPS   Oral   Take 220 mg by mouth daily.           There were no vitals taken for this visit.  Physical Exam  Nursing note and vitals reviewed. Constitutional: He appears well-developed and well-nourished. No distress.  HENT:  Head: Atraumatic.  Dry tongue  Eyes: Conjunctivae normal are normal.  Neck: Normal range of motion. Neck supple.  Cardiovascular: Normal rate and regular rhythm.   Murmur (2/6 systolic murmur best heard at the second intercostal space on the right chest.) heard. Pulmonary/Chest: Effort normal and breath sounds normal.  No respiratory distress. He has no wheezes. He exhibits tenderness (Diffuse chest wall tenderness on palpation without emphysema, crepitus, or deformity noted).  Abdominal: Soft. He exhibits no distension. There is tenderness (diffuse abdominal tenderness on palpation without guarding or rebound tenderness. No hernia noted. No overlying skin changes noted.). There is no rebound and no guarding.  Genitourinary:       Penis uncircumcised  Normal rectal tone, soft stool in rectal vault without evidence of mass, or obvious obstruction.  Musculoskeletal: He exhibits edema (Trace edema noted to lower extremities bilaterally without palpable cords, erythema, negative Homans sign. Normal left and right hip flexion and extension without overlying skin changes.). He exhibits no tenderness.  Neurological: He is alert.  Skin: No rash noted.  Psychiatric: He has a normal mood and affect.    ED Course  Procedures (including critical care time)   Date: 10/25/2012  Rate: 79  Rhythm: normal sinus rhythm  QRS Axis: normal  Intervals: normal  ST/T Wave abnormalities: normal  Conduction Disutrbances:none  Narrative Interpretation:   Old EKG Reviewed: unchanged  Results for orders placed during the hospital encounter of 10/25/12  OCCULT BLOOD, POC DEVICE      Component Value Range   Fecal Occult Bld NEGATIVE  NEGATIVE  CBC WITH DIFFERENTIAL      Component Value Range   WBC 10.5  4.0 - 10.5 K/uL   RBC 4.73  4.22 - 5.81 MIL/uL   Hemoglobin 13.4  13.0 - 17.0 g/dL   HCT 57.8 (*) 46.9 - 62.9 %   MCV 81.0  78.0 - 100.0 fL   MCH 28.3  26.0 - 34.0 pg   MCHC 35.0  30.0 - 36.0 g/dL   RDW 52.8  41.3 - 24.4 %   Platelets 168  150 - 400 K/uL   Neutrophils Relative 77  43 - 77 %   Neutro Abs 8.1 (*) 1.7 - 7.7 K/uL   Lymphocytes Relative 14  12 - 46 %   Lymphs Abs 1.5  0.7 - 4.0 K/uL   Monocytes Relative 7  3 - 12 %   Monocytes Absolute 0.8  0.1 - 1.0 K/uL   Eosinophils Relative 2  0 - 5 %   Eosinophils  Absolute 0.2  0.0 - 0.7 K/uL   Basophils Relative 0  0 - 1 %   Basophils Absolute 0.0  0.0 - 0.1 K/uL  COMPREHENSIVE METABOLIC PANEL      Component Value Range   Sodium 137  135 - 145 mEq/L   Potassium 4.1  3.5 - 5.1 mEq/L   Chloride 104  96 - 112 mEq/L   CO2 23  19 - 32 mEq/L   Glucose, Bld 213 (*) 70 - 99 mg/dL   BUN 20  6 - 23 mg/dL   Creatinine, Ser 0.10  0.50 - 1.35 mg/dL   Calcium 9.2  8.4 - 27.2 mg/dL   Total Protein 6.4  6.0 - 8.3 g/dL   Albumin 3.1 (*) 3.5 - 5.2 g/dL   AST 12  0 - 37 U/L   ALT 9  0 - 53 U/L   Alkaline Phosphatase 71  39 - 117 U/L   Total Bilirubin 0.2 (*) 0.3 -  1.2 mg/dL   GFR calc non Af Amer 67 (*) >90 mL/min   GFR calc Af Amer 77 (*) >90 mL/min  LIPASE, BLOOD      Component Value Range   Lipase 30  11 - 59 U/L  TROPONIN I      Component Value Range   Troponin I <0.30  <0.30 ng/mL   Ct Abdomen Pelvis Wo Contrast  10/12/2012  *RADIOLOGY REPORT*  Clinical Data: No bowel movements; abdominal distension.  CT ABDOMEN AND PELVIS WITHOUT CONTRAST  Technique:  Multidetector CT imaging of the abdomen and pelvis was performed following the standard protocol without intravenous contrast.  Comparison: CT of the abdomen and pelvis performed 03/11/2012  Findings: Mild bibasilar atelectasis is noted.  Diffuse coronary artery calcifications are seen, and mild calcification is noted at the aortic valve.  The patient is status post median sternotomy, with nonunion of the inferior sternum.  The liver and spleen are unremarkable in appearance.  Scattered stones are seen dependently within the gallbladder; the gallbladder is otherwise unremarkable in appearance.  The pancreas and adrenal glands are unremarkable.  There is right-sided renal pelvicaliectasis, with slight prominence of the right ureter.  This is perhaps slightly more prominent than on the prior study, but no distal obstructing stone is seen.  A thin 7 mm stone is noted dependently in the bladder, possibly reflecting  a recently passed stone.  Nonspecific perinephric stranding is again noted bilaterally.  A large 1.0 cm stone is again noted at the lower pole of the right kidney.  Scattered bilateral renal cysts are grossly stable in appearance, including a mildly complex 2.1 cm cyst in the upper pole of the left kidney, demonstrating minimal calcification.  No free fluid is identified.  The small bowel is unremarkable in appearance.  The stomach is within normal limits.  No acute vascular abnormalities are seen.  Scattered calcification is noted along the abdominal aorta, and along both proximal renal arteries.  The patient is status post appendectomy.  Contrast progresses to the splenic flexure of the colon.  The colon is partially filled with stool and is unremarkable in appearance.  There is no definite evidence for significant constipation.  The bladder is mildly distended.  There is new mild irregular bladder wall thickening along the left side of the bladder. Cystoscopy could be considered for further evaluation, to exclude malignancy.  The prostate is mildly enlarged, measuring 5.4 cm in transverse dimension.  No inguinal lymphadenopathy is seen.  No acute osseous abnormalities are identified.  Mild facet hypertrophy is noted at the lower lumbar spine.  IMPRESSION:  1.  No definite evidence for significant constipation.  The colon is partially filled with stool and is unremarkable in appearance. Contrast has progressed to the splenic flexure of the colon. 2.  New mild irregular bladder wall thickening along the left side of the bladder.  Cystoscopy could be considered for further evaluation, to exclude malignancy. 3.  Right-sided renal pelvicaliectasis, with slight prominence of the right ureter; this appears slightly more prominent than on the prior study, without evidence of a distal obstructing stone.  A thin 7 mm stone dependently in the bladder may reflect a recently passed stone. 4.  Mildly enlarged prostate. 5.   Scattered calcification along the abdominal aorta, and along both proximal renal arteries. 6.  1.0 cm nonobstructing stone again noted at the lower pole of the right kidney; scattered bilateral renal cysts again seen, grossly stable in appearance. 7.  Diffuse coronary artery calcifications  noted.  Status post median sternotomy, with nonunion of the inferior sternum. 8.  Mild bibasilar atelectasis noted. 9.  Cholelithiasis noted; gallbladder otherwise unremarkable in appearance.   Original Report Authenticated By: Tonia Ghent, M.D.    Dg Abd Acute W/chest  10/25/2012  *RADIOLOGY REPORT*  Clinical Data: Abdominal pain and constipation.  Last bowel movement 2 weeks ago.  ACUTE ABDOMEN SERIES (ABDOMEN 2 VIEW & CHEST 1 VIEW)  Comparison: 10/12/2012  Findings: Stable postoperative changes in the mediastinum.  Normal heart size and pulmonary vascularity.  Peribronchial thickening suggesting chronic bronchitis.  No airspace disease or consolidation.  No blunting of costophrenic angles.  No pneumothorax.  Stable appearance since previous study.  Calcification projected over the right kidney is stable and probably represents a stone.  Postoperative changes in the right abdomen.  Gas and stool in the colon without abnormal distension. No small bowel distension.  No free intra-abdominal air.  No abnormal air fluid levels.  Degenerative changes in the spine. Vascular calcifications in the pelvis.  IMPRESSION: No evidence of active pulmonary disease.  Nonobstructive bowel gas pattern.   Original Report Authenticated By: Burman Nieves, M.D.    Dg Abd Acute W/chest  10/12/2012  *RADIOLOGY REPORT*  Clinical Data: Lower abdominal pain.  Constipation.  Prior history of colon cancer.  ACUTE ABDOMEN SERIES (ABDOMEN 2 VIEW & CHEST 1 VIEW)  Comparison: Two-view abdomen x-ray 08/05/2012 [facility].  Acute abdomen series 07/14/2012 [place].  Findings: Bowel gas pattern unremarkable without evidence of  obstruction or significant ileus.  Very large stool burden throughout the colon from cecum to rectum.  Approximate 1 cm calcification projected over the mid right kidney, unchanged.  No visible urinary tract calculi elsewhere.  Surgical anastomotic suture material in the right mid abdomen.  No evidence of free air or significant air fluid levels on the erect image.  Mild degenerative changes involving the lumbar spine.  Prior sternotomy for CABG.  Cardiac silhouette normal in size, unchanged.  Lungs clear.  Bronchovascular markings normal. Pulmonary vascularity normal.  No pneumothorax.  No pleural effusions.  IMPRESSION:  1.  No acute abdominal abnormality.  Very large stool burden. 2.  Approximate 1 cm calculus projected over the mid right kidney. 3.  No acute cardiopulmonary disease.   Original Report Authenticated By: Hulan Saas, M.D.       6:33 AM Pt was seen and evaluated by me for his chest wall, abdomen and hip pain.  Pain reproducible to chest wall and abdomen.  Pain not reproducible to L hip.  Pt was last seen a week ago for same, CT obtained showing no acute pathology.  Was d/c with Miralax and enema but pt unable to produce BM.  Since pt has speech impediment and was difficult to obtain history, will perform cardiac work up, check belly labs, obtain acute abdomen series and will reassess.  Pain medication given.  Care discussed with attending.  ECG reviewed by me shows no acute ischemic changes.  7:00 AM Acute abdominal series shows no acute finding concerning for SBO.  Pt with h/o stage 2 colon cancer (last coloscopy performed by Dr. Dickie La, colectomy 03/22/2012 performed by Dr. Lindie Spruce). Not currently receiving therapy at this time.  Plan to call for admission for chest pain rule out as pt is a poor historian and abd exam unremarkable.  Pt has significant cardiac disease, last cath was last august by Dr. Jacinto Halim.    7:39 AM I have consulted [facility] who agrees to  admit pt to tele  bed, obs, Team 11 under the care of Dr. Jomarie Longs.  BP 143/67  Pulse 69  Temp 98.2 F (36.8 C) (Oral)  Resp 18  SpO2 97%  I have reviewed nursing notes and vital signs. I personally reviewed the imaging tests through PACS system  I reviewed available ER/hospitalization records thought the EMR  1. Chest pain 2. constipation  MDM         Fayrene Helper, PA-C 10/25/12 0801

## 2012-10-25 NOTE — ED Notes (Signed)
Patient states indigestion type pain since last night 8pm, patient also c/o abdominal pain and left hip pain.

## 2012-10-25 NOTE — ED Notes (Signed)
Patient to xray at this time

## 2012-10-25 NOTE — ED Provider Notes (Signed)
Medical screening examination/treatment/procedure(s) were performed by non-physician practitioner and as supervising physician I was immediately available for consultation/collaboration.  Sunnie Nielsen, MD 10/25/12 2312

## 2012-10-25 NOTE — H&P (Signed)
[facility] History and Physical  Miron Marxen Larrivee ZOX:[id number] DOB: 15-Sep-1941 DOA: 10/25/2012  Referring physician:  PCP: Delorse Lek, MD  Specialists:   Chief Complaint: chest pain/abdominal pain/ seizure/constipation  HPI: [name] is a very pleasant  72 y.o. male with pmhx that includes CVA x2, colon cancer s/p right hemicolectomy 7/13, MI, CAD s/p CABG, DM, HTN, COPD, expressive aphasia, dysphasia who presents to [place] with cc chest pain, abdominal pain, constipation and seizure. Information obtained from patient (with difficulty due to expressive aphasia) and daughter by phone. Pt states developed intermittent abdominal pain 5 days ago. Of note patient was seen in the ED a week ago for same complaint. He had an abdominal CT scan without contrast which shows nonspecific stranding of the bladder and moderate amount of stool without evidence of SBO, or ileus. Patient was discharged with MiraLAX, mineral oil enema. He reports he did not take MiraLAX. He denies passing gas. He denies having had BM for 2 weeks.  He denies n/v/anorexia. He states he does feel bloated and the need to have BM but cannot. He reports hx indigestion.  He denies fever, chills, headache, sob, diaphoresis dizziness. He reports pain mostly in lower abdominal quadrants with some radiation to left hip. Describes pain as constant dull ache. Daughter reports yesterday pt complained of chest pain in addition to worsening abdominal pain. Pt states chest pain located left anterior and radiates to left arm and is associated with eating. Describes the pain as dull ache and characterizes these pains as moderate to severe. Pt reports the pain is different from pain he had in past with MI.  Daughter also reports pt had seizure like activity around 5am this morning. Describes pt as head "going back and eyes rolled back and body shaking a little". She reports pt with hx of seizure after stroke. Reports loss of bladder  control this am. She reports this episode lasted about 30 minutes. Symptoms came gradually have persisted and worsened. Characterized as moderate/severe. Rates abdominal pain 8/10 at worst. In Ed pt received asa, NTG, zofran without relief. Abdominal xray yields gas and stool in the colon without abnormal distension. No small bowel distension. No free intra-abdominal air. We are asked to admit.     Review of Systems: The patient denies anorexia, fever, weight loss,, vision loss, decreased hearing, hoarseness,  syncope, peripheral edema, balance deficits, hemoptysis,  melena, hematochezia, hematuria, incontinence, genital sores, muscle weakness, suspicious skin lesions, transient blindness, difficulty walking, depression, unusual weight change, abnormal bleeding,  angioedema, and breast masses.    Past Medical History  Diagnosis Date  . Myocardial infarction   . CVA (cerebral infarction)   . Diabetes mellitus   . Hypertension   . Diabetes mellitus   . Aphasia   . Dysphagia   . COPD (chronic obstructive pulmonary disease)   . Shortness of breath   . Anemia   . Stroke   . Colon cancer   . Hemorrhoid    Past Surgical History  Procedure Date  . Coronary artery bypass graft   . Appendectomy   . Dental surgery   . Colonoscopy 03/13/2012    Procedure: COLONOSCOPY;  Surgeon: Hart Carwin, MD;  Location: [facility];  Service: Endoscopy;  Laterality: N/A;  . Partial colectomy 03/22/2012    Procedure: PARTIAL COLECTOMY;  Surgeon: Cherylynn Ridges, MD;  Location: [facility] OR;  Service: General;  Laterality: Right;  . Coronary artery bypass graft     x3  . Coronary  stent placement    Social History:  reports that he quit smoking about 9 months ago. His smoking use included Cigarettes. He quit after 16 years of use. He has never used smokeless tobacco. He reports that he does not drink alcohol or use illicit drugs. 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 Lives with wife.  No Known  Allergies  Family History  Problem Relation Age of Onset  . Breast cancer Mother   . Heart disease Brother   . Colon cancer Maternal Uncle      Prior to Admission medications   Medication Sig Start Date End Date Taking? Authorizing Provider  cephALEXin (KEFLEX) 500 MG capsule Take 1,000 mg by mouth 4 (four) times daily.   Yes Historical Provider, MD  clopidogrel (PLAVIX) 75 MG tablet Take 75 mg by mouth daily.   Yes Historical Provider, MD  escitalopram (LEXAPRO) 10 MG tablet Take 10 mg by mouth daily.   Yes Historical Provider, MD  food thickener (THICK IT) POWD Take by mouth as needed.   Yes Historical Provider, MD  Multiple Vitamin (MULITIVITAMIN WITH MINERALS) TABS Take 1 tablet by mouth daily.   Yes Historical Provider, MD  nitroGLYCERIN (NITROSTAT) 0.4 MG SL tablet Place 0.4 mg under the tongue every 5 (five) minutes x 3 doses as needed. For chest pain   Yes Historical Provider, MD  oxazepam (SERAX) 30 MG capsule Take 30 mg by mouth at bedtime.   Yes Historical Provider, MD  polyethylene glycol (MIRALAX) packet Take 17 g by mouth daily. 10/13/12  Yes Derwood Kaplan, MD  sitaGLIPtan-metformin (JANUMET) 50-1000 MG per tablet Take 1 tablet by mouth 2 (two) times daily with a meal.   Yes Historical Provider, MD  valsartan (DIOVAN) 160 MG tablet Take 160 mg by mouth daily.   Yes Historical Provider, MD  vitamin C (ASCORBIC ACID) 500 MG tablet Take 500 mg by mouth 2 (two) times daily.   Yes Historical Provider, MD  zinc sulfate 220 MG capsule Take 220 mg by mouth daily.   Yes Historical Provider, MD  albuterol (PROVENTIL HFA;VENTOLIN HFA) 108 (90 BASE) MCG/ACT inhaler Inhale 1-2 puffs into the lungs every 6 (six) hours as needed for wheezing. 07/29/12   Hayden Rasmussen, NP  aspirin EC 81 MG tablet Take 81 mg by mouth daily. For CVA prophylaxis.    Historical Provider, MD  promethazine (PHENERGAN) 25 MG tablet Take 25 mg by mouth every 6 (six) hours as needed. For nausea.    Historical Provider, MD    Physical Exam: Filed Vitals:   10/25/12 0800 10/25/12 0830 10/25/12 0900 10/25/12 0950  BP: 135/58 132/62 146/63 181/91  Pulse: 62 65 67 64  Temp:    98.2 F (36.8 C)  TempSrc:      Resp: 14 16 21 20   Height:    5\' 4"  (1.626 m)  Weight:    70.943 kg (156 lb 6.4 oz)  SpO2: 98% 98% 98% 98%     General:  Well nourished somewhat disheveled NAD  Eyes: PERRL EOMI no scleral icterus  ENT: ears clear, nose without drainage, mucus membrane mouth moist pink, no teeth  Neck: right cervical lymph node enlarged/firm somewhat tender to touch, supple full rom  Cardiovascular: RRR faint murmur, no gallop/rub, no LEE PPP  Respiratory: normal effort BSCTAB no wheeze/rhonci  Abdomen: somewhat rounded, soft, hyperactive BS mild tenderness to palpation throughout, no guarding/rebounding. Healed incision  Skin: warm, dry, no rash/lesion  Musculoskeletal: MAE, no joint swelling/erythema non-tender  Psychiatric: calm cooperative,  spontaneous inappropriate laughing/crying  Neurologic: cranial nerve II-XII intact severe expressive aphasia, facial symmetry   Labs on Admission:  Basic Metabolic Panel:  Lab 10/25/12 1610 10/24/12 2310  NA 137 137  K 4.1 4.2  CL 104 101  CO2 23 24  GLUCOSE 213* 200*  BUN 20 21  CREATININE 1.09 1.18  CALCIUM 9.2 9.8  MG -- --  PHOS -- --   Liver Function Tests:  Lab 10/25/12 0625 10/24/12 2310  AST 12 14  ALT 9 10  ALKPHOS 71 80  BILITOT 0.2* 0.2*  PROT 6.4 7.5  ALBUMIN 3.1* 3.7    Lab 10/25/12 0625  LIPASE 30  AMYLASE --   No results found for this basename: AMMONIA:5 in the last 168 hours CBC:  Lab 10/25/12 0625 10/24/12 2310  WBC 10.5 8.6  NEUTROABS 8.1* 5.4  HGB 13.4 14.6  HCT 38.3* 41.9  MCV 81.0 80.7  PLT 168 165   Cardiac Enzymes:  Lab 10/25/12 0626  CKTOTAL --  CKMB --  CKMBINDEX --  TROPONINI <0.30    BNP (last 3 results) No results found for this basename: PROBNP:3 in the last 8760 hours CBG: No results found  for this basename: GLUCAP:5 in the last 168 hours  Radiological Exams on Admission: Dg Abd Acute W/chest  10/25/2012  *RADIOLOGY REPORT*  Clinical Data: Abdominal pain and constipation.  Last bowel movement 2 weeks ago.  ACUTE ABDOMEN SERIES (ABDOMEN 2 VIEW & CHEST 1 VIEW)  Comparison: 10/12/2012  Findings: Stable postoperative changes in the mediastinum.  Normal heart size and pulmonary vascularity.  Peribronchial thickening suggesting chronic bronchitis.  No airspace disease or consolidation.  No blunting of costophrenic angles.  No pneumothorax.  Stable appearance since previous study.  Calcification projected over the right kidney is stable and probably represents a stone.  Postoperative changes in the right abdomen.  Gas and stool in the colon without abnormal distension. No small bowel distension.  No free intra-abdominal air.  No abnormal air fluid levels.  Degenerative changes in the spine. Vascular calcifications in the pelvis.  IMPRESSION: No evidence of active pulmonary disease.  Nonobstructive bowel gas pattern.   Original Report Authenticated By: Burman Nieves, M.D.     EKG: Independently reviewed. NSR  Assessment/Plan 1. Abdominal pain/Constipation: No BM in 2 weeks, declines chronic narcotic use CT 1/25 yield colon partially filled with stool and today's abdominal xray with gas/stool colon without abnormal distention.  Bowel regimen with  mirilax, senokot band dulcolax. If no results by this evening will consider enema. Will provide pain med as needed as well as anti-emetic as needed.  H/o hemicolectomy 7/13 for stage 2 adenio  2.  *Chest pain, very atypical: monitor on tele, cycle troponins, and repeat EKG in am.  Add PPI Doubt pain cardiac related given other GI symptoms.   3. Seizure: family reports seizure activity this am and  hx of same at nursing home where he recovered from stroke. No anticonvulsants on home med. Will place on seizure precautions. Request EEG. Neuro  consult.    4. DM II: controlled with oral meds and diet. Will continue home meds. Check hga1c. Use SSI for glycemic control.    5. HTN (hypertension), benign: controlled. Will continue home meds and monitor.   6. Expressive aphasia/dysphagia: as result of CVA. At baseline. Will continue liquid thickener as used at home. Requested dysphagia diet   7.  Anemia: stable at baseline   8. CAD (coronary artery disease):  Continue asa, metoprolol, plavix  In 8/13 pt underwent successful PTCA and stenting of the left mid circumflex.    9. COPD (chronic obstructive pulmonary disease): stable at baseline. Continue home meds   10. H/O: CVA (cerebrovascular accident): on Asa and Plavix    11. Enlarged lymph node: likely need OP follow up.   12. Hx colon cancer: per Dr. Kalman Drape note 11/13 Stage II (T3 N0) adenocarcinoma of the ascending colon and  remains in clinical remission from colon cancer. He will see Dr. Juanda Chance for a surveillanding  colonoscopy in June or July of next year    Code Status: full Family Communication: daughter Lina Sar by phone [phone] Disposition Plan: Home when ready hopefully 1-2 days Time spent: 70 minutes  [facility]   If 7PM-7AM, please contact night-coverage [email or web address] [facility] 10/25/2012, 10:11 AM  Pt seen and examined, the above note reviewed and changes made Admitted this am with diffuse abd pain, constipation, non specific chest pain and ? seizure like episode as described by family Add bowel regimen with miralax/dulcolax/senokot cycle cardiac enzymes In terms of questionable seizure like activity will request Neuro input and EEG  Zannie Cove, MD [phone]

## 2012-10-25 NOTE — ED Notes (Signed)
Called x's 3 without response 

## 2012-10-25 NOTE — Consult Note (Signed)
NEURO HOSPITALIST CONSULT NOTE    Reason for Consult: Seizure  HPI:                                                                                                                                          [name] is an 72 y.o. 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  Per daughter who is a poor historian, he has had 2 seizures since. He has not been brought to PCP for these previous seizures.  Initially she stated he had a seizure this am that lasted for 40 minutes.  When further questioning about the timeline of events daughter stated he actually had two seizures. One seizure was last night lasting for 40 minutes (No EMS was called) and another seizure lasting for 40 minutes this AM at round 5.  Family states they called EMS at onset of seizure and it took 5 minutes for EMS to arrive.  No report of seizure activity on EMS arrival by ED.  Unclear how long seizure lasted.  Family states there was no confusion after either episode. The description of seizure is both eyes rolled back and bilateral arms and legs held in extension. At present time patient is at his baseline, alert, oriented and follow all commands. He is extremely dysarthric from his previous CVA and history is hard to obtain.     Past Medical History  Diagnosis Date  . Myocardial infarction   . CVA (cerebral infarction)   . Diabetes mellitus   . Hypertension   . Diabetes mellitus   . Aphasia   . Dysphagia   . COPD (chronic obstructive pulmonary disease)   . Shortness of breath   . Anemia   . Stroke   . Colon cancer   . Hemorrhoid     Past Surgical History  Procedure Date  . Coronary artery bypass graft   . Appendectomy   . Dental surgery   . Colonoscopy 03/13/2012    Procedure: COLONOSCOPY;  Surgeon: Hart Carwin, MD;  Location: [facility];  Service: Endoscopy;  Laterality: N/A;  . Partial colectomy 03/22/2012     Procedure: PARTIAL COLECTOMY;  Surgeon: Cherylynn Ridges, MD;  Location: [facility] OR;  Service: General;  Laterality: Right;  . Coronary artery bypass graft     x3  . Coronary stent placement     Family History  Problem Relation Age of Onset  . Breast cancer Mother   . Heart disease Brother   . Colon cancer Maternal Uncle      Social History:  reports that he quit smoking about 9 months ago. His smoking use included Cigarettes. He quit after 16 years of use. He has never used smokeless tobacco.  He reports that he does not drink alcohol or use illicit drugs.  No Known Allergies  MEDICATIONS:                                                                                                                     Prior to Admission:  Prescriptions prior to admission  Medication Sig Dispense Refill  . cephALEXin (KEFLEX) 500 MG capsule Take 1,000 mg by mouth 4 (four) times daily.      . clopidogrel (PLAVIX) 75 MG tablet Take 75 mg by mouth daily.      Marland Kitchen escitalopram (LEXAPRO) 10 MG tablet Take 10 mg by mouth daily.      . food thickener (THICK IT) POWD Take by mouth as needed.      . Multiple Vitamin (MULITIVITAMIN WITH MINERALS) TABS Take 1 tablet by mouth daily.      . nitroGLYCERIN (NITROSTAT) 0.4 MG SL tablet Place 0.4 mg under the tongue every 5 (five) minutes x 3 doses as needed. For chest pain      . oxazepam (SERAX) 30 MG capsule Take 30 mg by mouth at bedtime.      . polyethylene glycol (MIRALAX) packet Take 17 g by mouth daily.  14 each  0  . sitaGLIPtan-metformin (JANUMET) 50-1000 MG per tablet Take 1 tablet by mouth 2 (two) times daily with a meal.      . valsartan (DIOVAN) 160 MG tablet Take 160 mg by mouth daily.      . vitamin C (ASCORBIC ACID) 500 MG tablet Take 500 mg by mouth 2 (two) times daily.      Marland Kitchen zinc sulfate 220 MG capsule Take 220 mg by mouth daily.      Marland Kitchen albuterol (PROVENTIL HFA;VENTOLIN HFA) 108 (90 BASE) MCG/ACT inhaler Inhale 1-2 puffs into the lungs every 6 (six)  hours as needed for wheezing.  1 Inhaler  0  . aspirin EC 81 MG tablet Take 81 mg by mouth daily. For CVA prophylaxis.      Marland Kitchen promethazine (PHENERGAN) 25 MG tablet Take 25 mg by mouth every 6 (six) hours as needed. For nausea.       Scheduled:   . sodium chloride   Intravenous STAT  . aspirin EC  81 mg Oral Daily  . clopidogrel  75 mg Oral Daily  . enoxaparin (LOVENOX) injection  40 mg Subcutaneous Q24H  . escitalopram  10 mg Oral Daily  . insulin aspart  0-9 Units Subcutaneous TID WC  . irbesartan  75 mg Oral Daily  . linagliptin  5 mg Oral Daily  . magnesium hydroxide  30 mL Oral Once  . metFORMIN  1,000 mg Oral BID WC  . polyethylene glycol  17 g Oral Daily  . senna-docusate  2 tablet Oral BID  . sodium chloride  3 mL Intravenous Q12H     ROS:  History obtained from the patient  General ROS: negative for - chills, fatigue, fever, night sweats, weight gain or weight loss Psychological ROS: negative for - behavioral disorder, hallucinations, memory difficulties, mood swings or suicidal ideation Ophthalmic ROS: negative for - blurry vision, double vision, eye pain or loss of vision ENT ROS: negative for - epistaxis, nasal discharge, oral lesions, sore throat, tinnitus or vertigo Allergy and Immunology ROS: negative for - hives or itchy/watery eyes Hematological and Lymphatic ROS: negative for - bleeding problems, bruising or swollen lymph nodes Endocrine ROS: negative for - galactorrhea, hair pattern changes, polydipsia/polyuria or temperature intolerance Respiratory ROS: negative for - cough, hemoptysis, shortness of breath or wheezing Cardiovascular ROS: negative for - chest pain, dyspnea on exertion, edema or irregular heartbeat Gastrointestinal ROS: negative for - abdominal pain, diarrhea, hematemesis, nausea/vomiting or stool  incontinence Genito-Urinary ROS: negative for - dysuria, hematuria, incontinence or urinary frequency/urgency Musculoskeletal ROS: negative for - joint swelling or muscular weakness Neurological ROS: as noted in HPI Dermatological ROS: negative for rash and skin lesion changes   Blood pressure 181/91, pulse 64, temperature 98.2 F (36.8 C), temperature source Oral, resp. rate 20, height 5\' 4"  (1.626 m), weight 70.943 kg (156 lb 6.4 oz), SpO2 98.00%.   Neurologic Examination:                                                                                                      Mental Status: Alert, oriented, thought content appropriate.  Speech very dysarthric without evidence of aphasia.  Able to follow 3 step commands without difficulty. Cranial Nerves: II: Discs flat bilaterally; Visual fields grossly normal, pupils equal, round, reactive to light and accommodation III,IV, VI: ptosis not present, extra-ocular motions intact bilaterally V,VII: smile asymmetric with right facial droop, facial light touch sensation normal bilaterally VIII: hearing normal bilaterally IX,X: gag reflex present XI: bilateral shoulder shrug XII: midline tongue extension Motor: Right : Upper extremity   5/5    Left:     Upper extremity   5/5  Lower extremity   5/5     Lower extremity   5/5 --Atrophy of both thenar eminances Tone and bulk:normal tone throughout; no atrophy noted Sensory: Pinprick and light touch intact throughout, bilaterally Deep Tendon Reflexes: 2+ and symmetric throughout Plantars: Right: downgoing   Left: downgoing Cerebellar: normal finger-to-nose,  normal heel-to-shin test  CV: pulses palpable throughout     Lab Results  Component Value Date/Time   CHOL 140 05/14/2012  5:31 AM    Results for orders placed during the hospital encounter of 10/25/12 (from the past 48 hour(s))  OCCULT BLOOD, POC DEVICE     Status: Normal   Collection Time   10/25/12  6:19 AM      Component Value  Range Comment   Fecal Occult Bld NEGATIVE  NEGATIVE   CBC WITH DIFFERENTIAL     Status: Abnormal   Collection Time   10/25/12  6:25 AM      Component Value Range Comment   WBC 10.5  4.0 - 10.5 K/uL    RBC 4.73  4.22 - 5.81 MIL/uL    Hemoglobin 13.4  13.0 - 17.0 g/dL    HCT 16.1 (*) 09.6 - 52.0 %    MCV 81.0  78.0 - 100.0 fL    MCH 28.3  26.0 - 34.0 pg    MCHC 35.0  30.0 - 36.0 g/dL    RDW 04.5  40.9 - 81.1 %    Platelets 168  150 - 400 K/uL    Neutrophils Relative 77  43 - 77 %    Neutro Abs 8.1 (*) 1.7 - 7.7 K/uL    Lymphocytes Relative 14  12 - 46 %    Lymphs Abs 1.5  0.7 - 4.0 K/uL    Monocytes Relative 7  3 - 12 %    Monocytes Absolute 0.8  0.1 - 1.0 K/uL    Eosinophils Relative 2  0 - 5 %    Eosinophils Absolute 0.2  0.0 - 0.7 K/uL    Basophils Relative 0  0 - 1 %    Basophils Absolute 0.0  0.0 - 0.1 K/uL   COMPREHENSIVE METABOLIC PANEL     Status: Abnormal   Collection Time   10/25/12  6:25 AM      Component Value Range Comment   Sodium 137  135 - 145 mEq/L    Potassium 4.1  3.5 - 5.1 mEq/L    Chloride 104  96 - 112 mEq/L    CO2 23  19 - 32 mEq/L    Glucose, Bld 213 (*) 70 - 99 mg/dL    BUN 20  6 - 23 mg/dL    Creatinine, Ser 9.14  0.50 - 1.35 mg/dL    Calcium 9.2  8.4 - 78.2 mg/dL    Total Protein 6.4  6.0 - 8.3 g/dL    Albumin 3.1 (*) 3.5 - 5.2 g/dL    AST 12  0 - 37 U/L    ALT 9  0 - 53 U/L    Alkaline Phosphatase 71  39 - 117 U/L    Total Bilirubin 0.2 (*) 0.3 - 1.2 mg/dL    GFR calc non Af Amer 67 (*) >90 mL/min    GFR calc Af Amer 77 (*) >90 mL/min   LIPASE, BLOOD     Status: Normal   Collection Time   10/25/12  6:25 AM      Component Value Range Comment   Lipase 30  11 - 59 U/L   TROPONIN I     Status: Normal   Collection Time   10/25/12  6:26 AM      Component Value Range Comment   Troponin I <0.30  <0.30 ng/mL   CBC     Status: Normal   Collection Time   10/25/12 10:33 AM      Component Value Range Comment   WBC 8.5  4.0 - 10.5 K/uL    RBC 4.98  4.22 -  5.81 MIL/uL    Hemoglobin 13.6  13.0 - 17.0 g/dL    HCT 95.6  21.3 - 08.6 %    MCV 80.3  78.0 - 100.0 fL    MCH 27.3  26.0 - 34.0 pg    MCHC 34.0  30.0 - 36.0 g/dL    RDW 57.8  46.9 - 62.9 %    Platelets 156  150 - 400 K/uL     Dg Abd Acute W/chest  10/25/2012  *RADIOLOGY REPORT*  Clinical Data: Abdominal pain and constipation.  Last bowel movement 2 weeks ago.  ACUTE ABDOMEN SERIES (ABDOMEN 2 VIEW & CHEST 1 VIEW)  Comparison: 10/12/2012  Findings: Stable postoperative changes in the mediastinum.  Normal heart size and pulmonary vascularity.  Peribronchial thickening suggesting chronic bronchitis.  No airspace disease or consolidation.  No blunting of costophrenic angles.  No pneumothorax.  Stable appearance since previous study.  Calcification projected over the right kidney is stable and probably represents a stone.  Postoperative changes in the right abdomen.  Gas and stool in the colon without abnormal distension. No small bowel distension.  No free intra-abdominal air.  No abnormal air fluid levels.  Degenerative changes in the spine. Vascular calcifications in the pelvis.  IMPRESSION: No evidence of active pulmonary disease.  Nonobstructive bowel gas pattern.   Original Report Authenticated By: Burman Nieves, M.D.      Assessment/Plan: 72 YO male with reported seizure X2 from family. Each seizure is reported to last for 40 minutes with no post-ictal period. Patient does have history of left MCA infarct on April 2013 which could have provide as a focus for seizure.  Currently he is back to his baseline and AOx3.    Recommend: 1) Agree with EEG and MRI 2) Would not start AED at this time--will make further recommendations after evaluating EEG and MRI.   Assessment and plan discussed with with attending physician and they are in agreement.    Felicie Morn PA-C [facility] [phone]  I personally participate in this patient's evaluation and management as well as clinical  assessment and formulation of management recommendations above.  Venetia Maxon M.D. [facility] [phone]  10/25/2012, 11:24 AM

## 2012-10-25 NOTE — Care Management Utilization Note (Signed)
CARE MANAGE MENT UTILIZATION REVIEW NOTE 10/25/2012     Patient:  [name], [name]   Account Number:  [id number]  Documented by:  Roxy Manns Joseandres Mazer   Per Ur Regulation Reviewed for med. necessity/level of care/duration of stay

## 2012-10-26 ENCOUNTER — Observation Stay (HOSPITAL_COMMUNITY): Payer: Medicare Other

## 2012-10-26 DIAGNOSIS — R109 Unspecified abdominal pain: Secondary | ICD-10-CM

## 2012-10-26 DIAGNOSIS — R4182 Altered mental status, unspecified: Secondary | ICD-10-CM | POA: Diagnosis present

## 2012-10-26 DIAGNOSIS — R079 Chest pain, unspecified: Secondary | ICD-10-CM

## 2012-10-26 LAB — CBC
HCT: 38.2 % — ABNORMAL LOW (ref 39.0–52.0)
MCHC: 34.3 g/dL (ref 30.0–36.0)
Platelets: 136 10*3/uL — ABNORMAL LOW (ref 150–400)
RDW: 13.5 % (ref 11.5–15.5)
WBC: 5.7 10*3/uL (ref 4.0–10.5)

## 2012-10-26 LAB — BASIC METABOLIC PANEL
BUN: 25 mg/dL — ABNORMAL HIGH (ref 6–23)
Chloride: 104 mEq/L (ref 96–112)
GFR calc Af Amer: 68 mL/min — ABNORMAL LOW (ref >90)
GFR calc non Af Amer: 58 mL/min — ABNORMAL LOW (ref >90)
Potassium: 4.2 mEq/L (ref 3.5–5.1)

## 2012-10-26 LAB — GLUCOSE, CAPILLARY
Glucose-Capillary: 134 mg/dL — ABNORMAL HIGH (ref 70–99)
Glucose-Capillary: 112 mg/dL — ABNORMAL HIGH (ref 70–99)
Glucose-Capillary: 113 mg/dL — ABNORMAL HIGH (ref 70–99)

## 2012-10-26 NOTE — Progress Notes (Addendum)
[facility] PROGRESS NOTE  [name] ZOX:[id number] DOB: [birth year] DOA: 10/25/2012 PCP: Delorse Lek, MD  Brief narrative 72 y.o. male with pmhx that includes CVA x2, colon cancer s/p right hemicolectomy 7/13, MI, CAD s/p CABG, DM, HTN, COPD, expressive aphasia, dysphasia who presents to [facility] on 2/7 with cc chest pain, abdominal pain, constipation and reported seizure like activity.  Assessment/Plan: 1. Abdominal pain/constipation: Patient had 7 BMs overnight her nursing. Denies abdominal pain. Continue bowel regimen. 2. Atypical chest pain:? GI origin from problem #1. Resolved. Cardiac enzymes negative. Do not see EKG in Epic from this admission. Will repeat. Continue PPI 3. Reported seizure x2: Neurology consultation appreciated. Followup EEG. 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 No AEDs at this time. No further episodes in hospital. 4. DM 2: Reasonably controlled in hospital. Continue SSI, metformin and Linaglipitin 5. HTN: Controlled. Continue home medications- ARB, BB. 6. Old CVA/expressive aphasia and dysphagia: Tolerating current diet. Continue aspirin and Plavix 7. Anemia & mild thrombocytopenia: follow CBC 8. CAD, status post PTCA to left mid circumflex on 8/13. 9. COPD: Stable 10. Enlarged right cervical lymph node: Outpatient followup. 11. Hx colon cancer: per Dr. Kalman Drape note 11/13 Stage II (T3 N0) adenocarcinoma of the ascending colon and remains in clinical remission from colon cancer. He will see Dr. Juanda Chance for a surveillanding colonoscopy in June or July of next year   Code Status: Full Family Communication: Discussed with patient. Disposition Plan: Home when medically stable.   Consultants:  Neurology  Procedures:  None  Antibiotics:  EEG   HPI/Subjective: Denies complaints. Multiple BMs overnight. Denies abdominal pain or chest pain. Tolerated diet.  Objective: Filed Vitals:   10/25/12 1430 10/25/12 2100  10/26/12 0500 10/26/12 0957  BP: 127/56 149/73 147/70 168/72  Pulse: 82 71 61 67  Temp: 98.2 F (36.8 C) 98.8 F (37.1 C) 98.1 F (36.7 C)   TempSrc:      Resp: 17 18 18    Height:      Weight:   70.353 kg (155 lb 1.6 oz)   SpO2: 100% 96% 98%     Intake/Output Summary (Last 24 hours) at 10/26/12 1058 Last data filed at 10/26/12 0856  Gross per 24 hour  Intake   1257 ml  Output    250 ml  Net   1007 ml   Filed Weights   10/25/12 0950 10/26/12 0500  Weight: 70.943 kg (156 lb 6.4 oz) 70.353 kg (155 lb 1.6 oz)    Exam:   General exam: Comfortable. Sitting at edge of bed.  Respiratory system: Clear. No increased work of breathing.  Cardiovascular system: S1 & S2 heard, RRR. No JVD, murmurs, gallops, clicks or pedal edema. Telemetry shows sinus bradycardia in the high 50s-sinus rhythm in the 80s.  Gastrointestinal system: Abdomen is nondistended, soft and nontender. Normal bowel sounds heard.  Central nervous system: Alert and oriented. Expressive aphasia and dysarthria.  Extremities: Right extremities: 4+/5 power. Left extremities 5/5 power   Data Reviewed: Basic Metabolic Panel:  Recent Labs Lab 10/24/12 2310 10/25/12 0625 10/25/12 1033 10/26/12 0535  NA 137 137  --  137  K 4.2 4.1  --  4.2  CL 101 104  --  104  CO2 24 23  --  25  GLUCOSE 200* 213*  --  141*  BUN 21 20  --  25*  CREATININE 1.18 1.09 1.11 1.22  CALCIUM 9.8 9.2  --  8.9   Liver Function Tests:  Recent Labs Lab 10/24/12 2310 10/25/12 0625  AST 14 12  ALT 10 9  ALKPHOS 80 71  BILITOT 0.2* 0.2*  PROT 7.5 6.4  ALBUMIN 3.7 3.1*    Recent Labs Lab 10/25/12 0625  LIPASE 30   No results found for this basename: AMMONIA,  in the last 168 hours CBC:  Recent Labs Lab 10/24/12 2310 10/25/12 0625 10/25/12 1033 10/26/12 0535  WBC 8.6 10.5 8.5 5.7  NEUTROABS 5.4 8.1*  --   --   HGB 14.6 13.4 13.6 13.1  HCT 41.9 38.3* 40.0 38.2*  MCV 80.7 81.0 80.3 82.0  PLT 165 168 156 136*    Cardiac Enzymes:  Recent Labs Lab 10/25/12 0626 10/25/12 1521 10/25/12 1903 10/25/12 2108  TROPONINI <0.30 <0.30 <0.30 <0.30   BNP (last 3 results) No results found for this basename: PROBNP,  in the last 8760 hours CBG:  Recent Labs Lab 10/25/12 1129 10/25/12 1617 10/25/12 2118 10/26/12 0731  GLUCAP 177* 198* 149* 134*    No results found for this or any previous visit (from the past 240 hour(s)).   Studies: Mr Brain Wo Contrast  10/25/2012  *RADIOLOGY REPORT*  Clinical Data: Seizure  MRI HEAD WITHOUT CONTRAST  Technique:  Multiplanar, multiecho pulse sequences of the brain and surrounding structures were obtained according to standard protocol without intravenous contrast.  Comparison: MRI 12/29/2011  Findings: Chronic left MCA infarct involving the lateral basal ganglia and insula. Chronic blood products are present in the infarct.  Chronic microvascular ischemic changes in the white matter.  Wallerian degeneration in the left mid brain and pons. Negative for acute infarct.  Negative for mass lesion.  No hydrocephalus.  There is a midline shift to the left due to volume loss from chronic infarction.  IMPRESSION: Chronic left MCA infarct with evidence of chronic hemorrhage.  No acute infarct or mass.   Original Report Authenticated By: Janeece Riggers, M.D.    Dg Abd Acute W/chest  10/25/2012  *RADIOLOGY REPORT*  Clinical Data: Abdominal pain and constipation.  Last bowel movement 2 weeks ago.  ACUTE ABDOMEN SERIES (ABDOMEN 2 VIEW & CHEST 1 VIEW)  Comparison: 10/12/2012  Findings: Stable postoperative changes in the mediastinum.  Normal heart size and pulmonary vascularity.  Peribronchial thickening suggesting chronic bronchitis.  No airspace disease or consolidation.  No blunting of costophrenic angles.  No pneumothorax.  Stable appearance since previous study.  Calcification projected over the right kidney is stable and probably represents a stone.  Postoperative changes in the right  abdomen.  Gas and stool in the colon without abnormal distension. No small bowel distension.  No free intra-abdominal air.  No abnormal air fluid levels.  Degenerative changes in the spine. Vascular calcifications in the pelvis.  IMPRESSION: No evidence of active pulmonary disease.  Nonobstructive bowel gas pattern.   Original Report Authenticated By: Burman Nieves, M.D.      Additional labs:   Scheduled Meds: . aspirin EC  81 mg Oral Daily  . bisacodyl  10 mg Oral BID  . clopidogrel  75 mg Oral Daily  . enoxaparin (LOVENOX) injection  40 mg Subcutaneous Q24H  . escitalopram  10 mg Oral Daily  . insulin aspart  0-9 Units Subcutaneous TID WC  . irbesartan  75 mg Oral Daily  . linagliptin  5 mg Oral Daily  . metFORMIN  1,000 mg Oral BID WC  . metoprolol tartrate  12.5 mg Oral BID  . pantoprazole  40 mg Oral Q1200  . polyethylene glycol  17 g Oral BID  . senna-docusate  2 tablet Oral BID  . sodium chloride  3 mL Intravenous Q12H   Continuous Infusions:    Principal Problem:   Chest pain, atypical Active Problems:   DM (diabetes mellitus), type 2, uncontrolled   HTN (hypertension), benign   Expressive aphasia   Abdominal pain   Anemia   CAD (coronary artery disease)   COPD (chronic obstructive pulmonary disease)   H/O: CVA (cerebrovascular accident)   Seizure   Enlarged lymph node   Constipation   Hx of colon cancer, stage II    Time spent: 35 minutes    [facility]  [facility] Pager [phone].   If 8PM-8AM, please contact night-coverage at [email or web address], password [facility] 10/26/2012, 10:58 AM  LOS: 1 day

## 2012-10-26 NOTE — Procedures (Signed)
EEG NUMBER:  [id number].  REFERRING PHYSICIAN:  Erin  E Black, DO.  INDICATION FOR STUDY:  A 72 year old man with a history of left MCA stroke, presenting with two episodes of apparent loss of consciousness and seizure-like activity.  There was no postictal confusion described, however.  Study is being performed to rule out possible new onset seizure disorder.  DESCRIPTION:  This is a routine EEG recording performed during wakefulness.  Predominant background activity consisted of low amplitude diffuse continuous symmetrical, 1-2 Hz delta activity with superimposed 8 Hz alpha rhythm recorded from the posterior head regions.  Photic stimulation was not performed.  Hyperventilation was not performed.  No epileptiform discharges were recorded.  There were no areas of disproportionate focal slowing.  INTERPRETATION:  This EEG showed minimal generalized nonspecific slowing of cerebral activity.  Such slowing can be seen with degenerative as well as metabolic encephalopathies.  No evidence of an epileptic disorder was demonstrated.     Noel Christmas, MD    [id number] D:  10/26/2012 14:46:49  T:  10/26/2012 22:00:27  Job #:  [id number]

## 2012-10-26 NOTE — Progress Notes (Signed)
Subjective: No new complaints. Patient is then had a recurrence of altered level of consciousness nor any seizure-like activity reported.  Objective: Current vital signs: BP 168/72  Pulse 67  Temp(Src) 98.1 F (36.7 C) (Oral)  Resp 18  Ht 5\' 4"  (1.626 m)  Wt 70.353 kg (155 lb 1.6 oz)  BMI 26.61 kg/m2  SpO2 98%  Neurologic Exam: Alert and in no acute distress. Patient is moderate expressive aphasia and appears to be confused as well. Pupils and extraocular movements were normal. No facial weakness. Speech was moderately dysarthric. Patient moved extremities well with normal strength throughout.  EEG showed minimal generalized nonspecific slowing of cerebral activity. No evidence of an epileptic disorder was seen.  Lab Results: Results for orders placed during the hospital encounter of 10/25/12 (from the past 48 hour(s))  OCCULT BLOOD, POC DEVICE     Status: None   Collection Time    10/25/12  6:19 AM      Result Value Range   Fecal Occult Bld NEGATIVE  NEGATIVE  CBC WITH DIFFERENTIAL     Status: Abnormal   Collection Time    10/25/12  6:25 AM      Result Value Range   WBC 10.5  4.0 - 10.5 K/uL   RBC 4.73  4.22 - 5.81 MIL/uL   Hemoglobin 13.4  13.0 - 17.0 g/dL   HCT 16.1 (*) 09.6 - 04.5 %   MCV 81.0  78.0 - 100.0 fL   MCH 28.3  26.0 - 34.0 pg   MCHC 35.0  30.0 - 36.0 g/dL   RDW 40.9  81.1 - 91.4 %   Platelets 168  150 - 400 K/uL   Neutrophils Relative 77  43 - 77 %   Neutro Abs 8.1 (*) 1.7 - 7.7 K/uL   Lymphocytes Relative 14  12 - 46 %   Lymphs Abs 1.5  0.7 - 4.0 K/uL   Monocytes Relative 7  3 - 12 %   Monocytes Absolute 0.8  0.1 - 1.0 K/uL   Eosinophils Relative 2  0 - 5 %   Eosinophils Absolute 0.2  0.0 - 0.7 K/uL   Basophils Relative 0  0 - 1 %   Basophils Absolute 0.0  0.0 - 0.1 K/uL  COMPREHENSIVE METABOLIC PANEL     Status: Abnormal   Collection Time    10/25/12  6:25 AM      Result Value Range   Sodium 137  135 - 145 mEq/L   Potassium 4.1  3.5 - 5.1  mEq/L   Chloride 104  96 - 112 mEq/L   CO2 23  19 - 32 mEq/L   Glucose, Bld 213 (*) 70 - 99 mg/dL   BUN 20  6 - 23 mg/dL   Creatinine, Ser 7.82  0.50 - 1.35 mg/dL   Calcium 9.2  8.4 - 95.6 mg/dL   Total Protein 6.4  6.0 - 8.3 g/dL   Albumin 3.1 (*) 3.5 - 5.2 g/dL   AST 12  0 - 37 U/L   ALT 9  0 - 53 U/L   Alkaline Phosphatase 71  39 - 117 U/L   Total Bilirubin 0.2 (*) 0.3 - 1.2 mg/dL   GFR calc non Af Amer 67 (*) >90 mL/min   GFR calc Af Amer 77 (*) >90 mL/min   Comment:            The eGFR has been calculated     using the CKD EPI equation.  This calculation has not been     validated in all clinical     situations.     eGFR's persistently     <90 mL/min signify     possible Chronic Kidney Disease.  LIPASE, BLOOD     Status: None   Collection Time    10/25/12  6:25 AM      Result Value Range   Lipase 30  11 - 59 U/L  TROPONIN I     Status: None   Collection Time    10/25/12  6:26 AM      Result Value Range   Troponin I <0.30  <0.30 ng/mL   Comment:            Due to the release kinetics of cTnI,     a negative result within the first hours     of the onset of symptoms does not rule out     myocardial infarction with certainty.     If myocardial infarction is still suspected,     repeat the test at appropriate intervals.  CBC     Status: None   Collection Time    10/25/12 10:33 AM      Result Value Range   WBC 8.5  4.0 - 10.5 K/uL   RBC 4.98  4.22 - 5.81 MIL/uL   Hemoglobin 13.6  13.0 - 17.0 g/dL   HCT 91.4  78.2 - 95.6 %   MCV 80.3  78.0 - 100.0 fL   MCH 27.3  26.0 - 34.0 pg   MCHC 34.0  30.0 - 36.0 g/dL   RDW 21.3  08.6 - 57.8 %   Platelets 156  150 - 400 K/uL  CREATININE, SERUM     Status: Abnormal   Collection Time    10/25/12 10:33 AM      Result Value Range   Creatinine, Ser 1.11  0.50 - 1.35 mg/dL   GFR calc non Af Amer 65 (*) >90 mL/min   GFR calc Af Amer 76 (*) >90 mL/min   Comment:            The eGFR has been calculated     using the CKD  EPI equation.     This calculation has not been     validated in all clinical     situations.     eGFR's persistently     <90 mL/min signify     possible Chronic Kidney Disease.  HEMOGLOBIN A1C     Status: Abnormal   Collection Time    10/25/12 10:33 AM      Result Value Range   Hemoglobin A1C 6.8 (*) <5.7 %   Comment: (NOTE)                                                                               According to the ADA Clinical Practice Recommendations for 2011, when     HbA1c is used as a screening test:      >=6.5%   Diagnostic of Diabetes Mellitus               (if abnormal result is confirmed)     5.7-6.4%   Increased risk  of developing Diabetes Mellitus     References:Diagnosis and Classification of Diabetes Mellitus,Diabetes     Care,2011,34(Suppl 1):S62-S69 and Standards of Medical Care in             Diabetes - 2011,Diabetes Care,2011,34 (Suppl 1):S11-S61.   Mean Plasma Glucose 148 (*) <117 mg/dL  GLUCOSE, CAPILLARY     Status: Abnormal   Collection Time    10/25/12 11:29 AM      Result Value Range   Glucose-Capillary 177 (*) 70 - 99 mg/dL  TROPONIN I     Status: None   Collection Time    10/25/12  3:21 PM      Result Value Range   Troponin I <0.30  <0.30 ng/mL   Comment:            Due to the release kinetics of cTnI,     a negative result within the first hours     of the onset of symptoms does not rule out     myocardial infarction with certainty.     If myocardial infarction is still suspected,     repeat the test at appropriate intervals.  GLUCOSE, CAPILLARY     Status: Abnormal   Collection Time    10/25/12  4:17 PM      Result Value Range   Glucose-Capillary 198 (*) 70 - 99 mg/dL  TROPONIN I     Status: None   Collection Time    10/25/12  7:03 PM      Result Value Range   Troponin I <0.30  <0.30 ng/mL   Comment:            Due to the release kinetics of cTnI,     a negative result within the first hours     of the onset of symptoms does not rule  out     myocardial infarction with certainty.     If myocardial infarction is still suspected,     repeat the test at appropriate intervals.  TROPONIN I     Status: None   Collection Time    10/25/12  9:08 PM      Result Value Range   Troponin I <0.30  <0.30 ng/mL   Comment:            Due to the release kinetics of cTnI,     a negative result within the first hours     of the onset of symptoms does not rule out     myocardial infarction with certainty.     If myocardial infarction is still suspected,     repeat the test at appropriate intervals.  GLUCOSE, CAPILLARY     Status: Abnormal   Collection Time    10/25/12  9:18 PM      Result Value Range   Glucose-Capillary 149 (*) 70 - 99 mg/dL   Comment 1 Notify RN    CBC     Status: Abnormal   Collection Time    10/26/12  5:35 AM      Result Value Range   WBC 5.7  4.0 - 10.5 K/uL   RBC 4.66  4.22 - 5.81 MIL/uL   Hemoglobin 13.1  13.0 - 17.0 g/dL   HCT 16.1 (*) 09.6 - 04.5 %   MCV 82.0  78.0 - 100.0 fL   MCH 28.1  26.0 - 34.0 pg   MCHC 34.3  30.0 - 36.0 g/dL   RDW 40.9  81.1 - 91.4 %  Platelets 136 (*) 150 - 400 K/uL  BASIC METABOLIC PANEL     Status: Abnormal   Collection Time    10/26/12  5:35 AM      Result Value Range   Sodium 137  135 - 145 mEq/L   Potassium 4.2  3.5 - 5.1 mEq/L   Chloride 104  96 - 112 mEq/L   CO2 25  19 - 32 mEq/L   Glucose, Bld 141 (*) 70 - 99 mg/dL   BUN 25 (*) 6 - 23 mg/dL   Creatinine, Ser 5.62  0.50 - 1.35 mg/dL   Calcium 8.9  8.4 - 13.0 mg/dL   GFR calc non Af Amer 58 (*) >90 mL/min   GFR calc Af Amer 68 (*) >90 mL/min   Comment:            The eGFR has been calculated     using the CKD EPI equation.     This calculation has not been     validated in all clinical     situations.     eGFR's persistently     <90 mL/min signify     possible Chronic Kidney Disease.  GLUCOSE, CAPILLARY     Status: Abnormal   Collection Time    10/26/12  7:31 AM      Result Value Range    Glucose-Capillary 134 (*) 70 - 99 mg/dL   Comment 1 Notify RN    GLUCOSE, CAPILLARY     Status: Abnormal   Collection Time    10/26/12 11:51 AM      Result Value Range   Glucose-Capillary 131 (*) 70 - 99 mg/dL   Comment 1 Notify RN      Studies/Results: Mr Brain Wo Contrast  10/25/2012  *RADIOLOGY REPORT*  Clinical Data: Seizure  MRI HEAD WITHOUT CONTRAST  Technique:  Multiplanar, multiecho pulse sequences of the brain and surrounding structures were obtained according to standard protocol without intravenous contrast.  Comparison: MRI 12/29/2011  Findings: Chronic left MCA infarct involving the lateral basal ganglia and insula. Chronic blood products are present in the infarct.  Chronic microvascular ischemic changes in the white matter.  Wallerian degeneration in the left mid brain and pons. Negative for acute infarct.  Negative for mass lesion.  No hydrocephalus.  There is a midline shift to the left due to volume loss from chronic infarction.  IMPRESSION: Chronic left MCA infarct with evidence of chronic hemorrhage.  No acute infarct or mass.   Original Report Authenticated By: Janeece Riggers, M.D.    Dg Abd Acute W/chest  10/25/2012  *RADIOLOGY REPORT*  Clinical Data: Abdominal pain and constipation.  Last bowel movement 2 weeks ago.  ACUTE ABDOMEN SERIES (ABDOMEN 2 VIEW & CHEST 1 VIEW)  Comparison: 10/12/2012  Findings: Stable postoperative changes in the mediastinum.  Normal heart size and pulmonary vascularity.  Peribronchial thickening suggesting chronic bronchitis.  No airspace disease or consolidation.  No blunting of costophrenic angles.  No pneumothorax.  Stable appearance since previous study.  Calcification projected over the right kidney is stable and probably represents a stone.  Postoperative changes in the right abdomen.  Gas and stool in the colon without abnormal distension. No small bowel distension.  No free intra-abdominal air.  No abnormal air fluid levels.  Degenerative changes in  the spine. Vascular calcifications in the pelvis.  IMPRESSION: No evidence of active pulmonary disease.  Nonobstructive bowel gas pattern.   Original Report Authenticated By: Burman Nieves, M.D.  Medications:  I have reviewed the patient's current medications. Scheduled: . aspirin EC  81 mg Oral Daily  . bisacodyl  10 mg Oral BID  . clopidogrel  75 mg Oral Daily  . enoxaparin (LOVENOX) injection  40 mg Subcutaneous Q24H  . escitalopram  10 mg Oral Daily  . insulin aspart  0-9 Units Subcutaneous TID WC  . irbesartan  75 mg Oral Daily  . linagliptin  5 mg Oral Daily  . metFORMIN  1,000 mg Oral BID WC  . metoprolol tartrate  12.5 mg Oral BID  . pantoprazole  40 mg Oral Q1200  . polyethylene glycol  17 g Oral BID  . senna-docusate  2 tablet Oral BID  . sodium chloride  3 mL Intravenous Q12H   ZOX:[id number], acetaminophen, albuterol, food thickener, HYDROcodone-acetaminophen, nitroGLYCERIN  Assessment/Plan: Etiology for seizure like activity remains unclear. It is doubtful whether patient had epileptic seizures, particularly with a history of no postictal confusion. EEG was not supportive, with no evidence of an epileptic disorder demonstrated. MRI showed no indications of an acute intracranial abnormality. Old left MCA territory stroke was noted.  No further neurodiagnostic studies are indicated at this point. I would not recommend anticonvulsant medication, as patient has not demonstrated an unequivocal new-onset seizure disorder.  C.R. Roseanne Reno, MD [facility] [phone]  10/26/2012  2:49 PM

## 2012-10-26 NOTE — Progress Notes (Signed)
Admission noted--have added Dr Truett Perna (pt's oncologist) to treatment team; Dr Myrle Sheng will be rounding tomoroow.

## 2012-10-26 NOTE — Progress Notes (Signed)
Portable EEG completed

## 2012-10-27 LAB — BASIC METABOLIC PANEL WITH GFR
BUN: 22 mg/dL (ref 6–23)
CO2: 24 meq/L (ref 19–32)
Calcium: 9.8 mg/dL (ref 8.4–10.5)
Chloride: 104 meq/L (ref 96–112)
Creatinine, Ser: 1.19 mg/dL (ref 0.50–1.35)
GFR calc Af Amer: 70 mL/min — ABNORMAL LOW
GFR calc non Af Amer: 60 mL/min — ABNORMAL LOW
Glucose, Bld: 129 mg/dL — ABNORMAL HIGH (ref 70–99)
Potassium: 4.8 meq/L (ref 3.5–5.1)
Sodium: 140 meq/L (ref 135–145)

## 2012-10-27 LAB — GLUCOSE, CAPILLARY: Glucose-Capillary: 144 mg/dL — ABNORMAL HIGH (ref 70–99)

## 2012-10-27 LAB — URINALYSIS, ROUTINE W REFLEX MICROSCOPIC
Glucose, UA: NEGATIVE mg/dL
Ketones, ur: NEGATIVE mg/dL
Nitrite: NEGATIVE
Protein, ur: 100 mg/dL — AB
pH: 6 (ref 5.0–8.0)

## 2012-10-27 LAB — URINE MICROSCOPIC-ADD ON

## 2012-10-27 LAB — CBC
HCT: 39 % (ref 39.0–52.0)
Hemoglobin: 13.6 g/dL (ref 13.0–17.0)
MCV: 80.4 fL (ref 78.0–100.0)
RBC: 4.85 MIL/uL (ref 4.22–5.81)
RDW: 13 % (ref 11.5–15.5)
WBC: 6.7 10*3/uL (ref 4.0–10.5)

## 2012-10-27 MED ORDER — POLYETHYLENE GLYCOL 3350 17 G PO PACK: 17.0000 g | PACK | Freq: Two times a day (BID) | ORAL | Status: DC

## 2012-10-27 MED ORDER — SENNOSIDES-DOCUSATE SODIUM 8.6-50 MG PO TABS: 2.0000 | ORAL_TABLET | Freq: Two times a day (BID) | ORAL | Status: DC

## 2012-10-27 NOTE — Discharge Summary (Signed)
Physician Discharge Summary  [name] NFA:[id number] DOB: 08-Aug-1941 DOA: 10/25/2012  PCP: Delorse Lek, MD  Admit date: 10/25/2012 Discharge date: 10/27/2012  Time spent: Less than 30 minutes  Recommendations for Outpatient Follow-up:  1. With Dr. Marjory Lies, PCP in 1 week. 2. With Dr. Ladene Artist, Oncologist. Please consider further evaluation of right enlarged cervical lymph node.   Discharge Diagnoses:  Principal Problem:   Chest pain, atypical Active Problems:   DM (diabetes mellitus), type 2, uncontrolled   HTN (hypertension), benign   Expressive aphasia   Abdominal pain   Anemia   CAD (coronary artery disease)   COPD (chronic obstructive pulmonary disease)   H/O: CVA (cerebrovascular accident)   Seizure   Enlarged lymph node   Constipation   Hx of colon cancer, stage II   Altered mental status   Discharge Condition: Improved & Stable  Diet recommendation: Resume prior dysphagia diet.  Filed Weights   10/25/12 0950 10/26/12 0500 10/27/12 0600  Weight: 70.943 kg (156 lb 6.4 oz) 70.353 kg (155 lb 1.6 oz) 71 kg (156 lb 8.4 oz)    History of present illness:  72 y.o. male with pmhx that includes CVA x2, colon cancer s/p right hemicolectomy 7/13, MI, CAD s/p CABG, DM, HTN, COPD, expressive aphasia, dysphasia who presents to [facility] on 2/7 with cc chest pain, abdominal pain, constipation and reported seizure like activity.  Hospital Course:  1. Abdominal pain/constipation: Patient was started on bowel regimen and had multiple BMs. Denies any further abdominal pain. 2. Atypical chest pain:? GI origin from problem #1. Resolved. Cardiac enzymes negative. No acute changes on EKG. No reflux symptoms. 3. Reported seizure x2: Neurology consultation appreciated. 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 EEG negative and neurology recommends no AEDs at this time. No further episodes in hospital. 4. DM 2: Reasonably controlled in hospital.  Continue SSI, metformin and Linaglipitin 5. HTN: Controlled. Continue home medications- ARB, BB. 6. Old CVA/expressive aphasia and dysphagia: Tolerating current diet. Continue aspirin and Plavix 7. CAD, status post PTCA to left mid circumflex on 8/13. 8. COPD: Stable 9. Enlarged right cervical lymph node: Outpatient followup. 10. Hx colon cancer: per Dr. Kalman Drape note 11/13 Stage II (T3 N0) adenocarcinoma of the ascending colon and remains in clinical remission from colon cancer. He will see Dr. Juanda Chance for a surveillanding colonoscopy in June or July of next year   Procedures:  EEG   Consultations:  Neurology  Discharge Exam:  Complaints: Patient denies complaints. Denies chest pain or abdominal pain. Eager to go home and has already packed up.  Filed Vitals:   10/26/12 1851 10/26/12 2100 10/27/12 0600 10/27/12 0938  BP: 147/61 167/72 127/54 128/65  Pulse: 66 60 60 72  Temp: 98.7 F (37.1 C) 98.2 F (36.8 C) 98.2 F (36.8 C)   TempSrc: Oral     Resp: 16 12 16    Height:      Weight:   71 kg (156 lb 8.4 oz)   SpO2: 98% 97% 97%     General exam: Comfortable. Sitting at edge of bed.  Respiratory system: Clear. No increased work of breathing.  Cardiovascular system: S1 & S2 heard, RRR. No JVD, murmurs, gallops, clicks or pedal edema. Telemetry shows sinus rythm.  Gastrointestinal system: Abdomen is nondistended, soft and nontender. Normal bowel sounds heard.  Central nervous system: Alert and oriented. Expressive aphasia and dysarthria.  Extremities: Right extremities: 4+/5 power. Left extremities 5/5 power  Discharge Instructions  Discharge Orders   Future Orders Complete By Expires     Call MD for:  severe uncontrolled pain  As directed     Call MD for:  As directed     Comments:      Involuntary movements.    Discharge instructions  As directed     Comments:      DIET: resume same diet that you were on before hospitalization.    Increase activity slowly   As directed         Medication List    STOP taking these medications       cephALEXin 500 MG capsule  Commonly known as:  KEFLEX      TAKE these medications       albuterol 108 (90 BASE) MCG/ACT inhaler  Commonly known as:  PROVENTIL HFA;VENTOLIN HFA  Inhale 1-2 puffs into the lungs every 6 (six) hours as needed for wheezing.     aspirin EC 81 MG tablet  Take 81 mg by mouth daily. For CVA prophylaxis.     clopidogrel 75 MG tablet  Commonly known as:  PLAVIX  Take 75 mg by mouth daily.     escitalopram 10 MG tablet  Commonly known as:  LEXAPRO  Take 10 mg by mouth daily.     food thickener Powd  Commonly known as:  THICK IT  Take by mouth as needed.     multivitamin with minerals Tabs  Take 1 tablet by mouth daily.     nitroGLYCERIN 0.4 MG SL tablet  Commonly known as:  NITROSTAT  Place 0.4 mg under the tongue every 5 (five) minutes x 3 doses as needed. For chest pain     oxazepam 30 MG capsule  Commonly known as:  SERAX  Take 30 mg by mouth at bedtime.     polyethylene glycol packet  Commonly known as:  MIRALAX  Take 17 g by mouth 2 (two) times daily.     promethazine 25 MG tablet  Commonly known as:  PHENERGAN  Take 25 mg by mouth every 6 (six) hours as needed. For nausea.     senna-docusate 8.6-50 MG per tablet  Commonly known as:  Senokot-S  Take 2 tablets by mouth 2 (two) times daily.     sitaGLIPtan-metformin 50-1000 MG per tablet  Commonly known as:  JANUMET  Take 1 tablet by mouth 2 (two) times daily with a meal.     valsartan 160 MG tablet  Commonly known as:  DIOVAN  Take 160 mg by mouth daily.     vitamin C 500 MG tablet  Commonly known as:  ASCORBIC ACID  Take 500 mg by mouth 2 (two) times daily.     zinc sulfate 220 MG capsule  Take 220 mg by mouth daily.       Follow-up Information   Follow up with BURNETT,BRENT A, MD. Schedule an appointment as soon as possible for a visit in 1 week.   Contact information:   [address] [place] [address] [phone]       Schedule an appointment as soon as possible for a visit with Thornton Papas, MD.   Contact information:   [address] [place] [address] [phone]        The results of significant diagnostics from this hospitalization (including imaging, microbiology, ancillary and laboratory) are listed below for reference.    Significant Diagnostic Studies:  Mr Sherrin Daisy Contrast  10/25/2012  *RADIOLOGY REPORT*  Clinical Data:  Seizure  MRI HEAD WITHOUT CONTRAST  Technique:  Multiplanar, multiecho pulse sequences of the brain and surrounding structures were obtained according to standard protocol without intravenous contrast.  Comparison: MRI 12/29/2011  Findings: Chronic left MCA infarct involving the lateral basal ganglia and insula. Chronic blood products are present in the infarct.  Chronic microvascular ischemic changes in the white matter.  Wallerian degeneration in the left mid brain and pons. Negative for acute infarct.  Negative for mass lesion.  No hydrocephalus.  There is a midline shift to the left due to volume loss from chronic infarction.  IMPRESSION: Chronic left MCA infarct with evidence of chronic hemorrhage.  No acute infarct or mass.   Original Report Authenticated By: Janeece Riggers, M.D.    Dg Abd Acute W/chest  10/25/2012  *RADIOLOGY REPORT*  Clinical Data: Abdominal pain and constipation.  Last bowel movement 2 weeks ago.  ACUTE ABDOMEN SERIES (ABDOMEN 2 VIEW & CHEST 1 VIEW)  Comparison: 10/12/2012  Findings: Stable postoperative changes in the mediastinum.  Normal heart size and pulmonary vascularity.  Peribronchial thickening suggesting chronic bronchitis.  No airspace disease or consolidation.  No blunting of costophrenic angles.  No pneumothorax.  Stable appearance since previous study.  Calcification projected over the right kidney is stable and probably represents a stone.  Postoperative changes in the right abdomen.  Gas and  stool in the colon without abnormal distension. No small bowel distension.  No free intra-abdominal air.  No abnormal air fluid levels.  Degenerative changes in the spine. Vascular calcifications in the pelvis.  IMPRESSION: No evidence of active pulmonary disease.  Nonobstructive bowel gas pattern.   Original Report Authenticated By: Burman Nieves, M.D.    EEG INTERPRETATION: This EEG showed minimal generalized nonspecific slowing of cerebral activity. Such slowing can be seen with degenerative as well as metabolic encephalopathies. No evidence of an epileptic disorder was demonstrated.   Microbiology: No results found for this or any previous visit (from the past 240 hour(s)).   Labs: Basic Metabolic Panel:  Recent Labs Lab 10/24/12 2310 10/25/12 0625 10/25/12 1033 10/26/12 0535 10/27/12 0505  NA 137 137  --  137 140  K 4.2 4.1  --  4.2 4.8  CL 101 104  --  104 104  CO2 24 23  --  25 24  GLUCOSE 200* 213*  --  141* 129*  BUN 21 20  --  25* 22  CREATININE 1.18 1.09 1.11 1.22 1.19  CALCIUM 9.8 9.2  --  8.9 9.8   Liver Function Tests:  Recent Labs Lab 10/24/12 2310 10/25/12 0625  AST 14 12  ALT 10 9  ALKPHOS 80 71  BILITOT 0.2* 0.2*  PROT 7.5 6.4  ALBUMIN 3.7 3.1*    Recent Labs Lab 10/25/12 0625  LIPASE 30   No results found for this basename: AMMONIA,  in the last 168 hours CBC:  Recent Labs Lab 10/24/12 2310 10/25/12 0625 10/25/12 1033 10/26/12 0535 10/27/12 0505  WBC 8.6 10.5 8.5 5.7 6.7  NEUTROABS 5.4 8.1*  --   --   --   HGB 14.6 13.4 13.6 13.1 13.6  HCT 41.9 38.3* 40.0 38.2* 39.0  MCV 80.7 81.0 80.3 82.0 80.4  PLT 165 168 156 136* 150   Cardiac Enzymes:  Recent Labs Lab 10/25/12 0626 10/25/12 1521 10/25/12 1903 10/25/12 2108  TROPONINI <0.30 <0.30 <0.30 <0.30   BNP: BNP (last 3 results) No results found for this basename: PROBNP,  in the last 8760 hours CBG:  Recent Labs Lab 10/26/12 1151 10/26/12 1719 10/26/12 2038  10/27/12 0747 10/27/12 1137  GLUCAP 131* 112* 113* 144* 115*    Additional labs: Hemoglobin A1c: 6.8 UA: Not convincing for UTI.  Discussed with patient's daughter, Ms. Lina Sar prior to discharge. She expressed concern regarding involuntary movements at home. Advised her that he had not demonstrated any such episodes in the hospital and extensive workup in the hospital was negative. Advised that if he were to have similar episode-to try and take him to his physician immediately to assess.   Signed:  Azarian Starace  [facility] 10/27/2012, 1:32 PM

## 2012-10-29 ENCOUNTER — Emergency Department (HOSPITAL_COMMUNITY)
Admission: EM | Admit: 2012-10-29 | Discharge: 2012-10-29 | Disposition: A | Discharge status: Other Acute Inpatient Hospital | Payer: Medicare Other | Source: Home / Self Care

## 2012-10-29 ENCOUNTER — Other Ambulatory Visit: Payer: Self-pay

## 2012-10-29 ENCOUNTER — Encounter (HOSPITAL_COMMUNITY): Payer: Self-pay | Admitting: *Deleted

## 2012-10-29 ENCOUNTER — Emergency Department (HOSPITAL_COMMUNITY)
Admission: EM | Admit: 2012-10-29 | Discharge: 2012-10-29 | Disposition: A | Discharge status: Home / Self Care | Payer: Medicare Other | Attending: Emergency Medicine | Admitting: Emergency Medicine

## 2012-10-29 DIAGNOSIS — M549 Dorsalgia, unspecified: Secondary | ICD-10-CM | POA: Insufficient documentation

## 2012-10-29 DIAGNOSIS — E119 Type 2 diabetes mellitus without complications: Secondary | ICD-10-CM | POA: Insufficient documentation

## 2012-10-29 DIAGNOSIS — I25810 Atherosclerosis of coronary artery bypass graft(s) without angina pectoris: Secondary | ICD-10-CM

## 2012-10-29 DIAGNOSIS — Z8719 Personal history of other diseases of the digestive system: Secondary | ICD-10-CM | POA: Insufficient documentation

## 2012-10-29 DIAGNOSIS — I69920 Aphasia following unspecified cerebrovascular disease: Secondary | ICD-10-CM | POA: Insufficient documentation

## 2012-10-29 DIAGNOSIS — Z79899 Other long term (current) drug therapy: Secondary | ICD-10-CM | POA: Insufficient documentation

## 2012-10-29 DIAGNOSIS — I69991 Dysphagia following unspecified cerebrovascular disease: Secondary | ICD-10-CM | POA: Insufficient documentation

## 2012-10-29 DIAGNOSIS — R079 Chest pain, unspecified: Secondary | ICD-10-CM

## 2012-10-29 DIAGNOSIS — R569 Unspecified convulsions: Secondary | ICD-10-CM

## 2012-10-29 DIAGNOSIS — J449 Chronic obstructive pulmonary disease, unspecified: Secondary | ICD-10-CM | POA: Insufficient documentation

## 2012-10-29 DIAGNOSIS — R109 Unspecified abdominal pain: Secondary | ICD-10-CM | POA: Insufficient documentation

## 2012-10-29 DIAGNOSIS — F17200 Nicotine dependence, unspecified, uncomplicated: Secondary | ICD-10-CM | POA: Insufficient documentation

## 2012-10-29 DIAGNOSIS — Z7982 Long term (current) use of aspirin: Secondary | ICD-10-CM | POA: Insufficient documentation

## 2012-10-29 DIAGNOSIS — Z862 Personal history of diseases of the blood and blood-forming organs and certain disorders involving the immune mechanism: Secondary | ICD-10-CM | POA: Insufficient documentation

## 2012-10-29 DIAGNOSIS — R197 Diarrhea, unspecified: Secondary | ICD-10-CM | POA: Insufficient documentation

## 2012-10-29 DIAGNOSIS — I10 Essential (primary) hypertension: Secondary | ICD-10-CM | POA: Insufficient documentation

## 2012-10-29 DIAGNOSIS — Z85038 Personal history of other malignant neoplasm of large intestine: Secondary | ICD-10-CM | POA: Insufficient documentation

## 2012-10-29 DIAGNOSIS — G40909 Epilepsy, unspecified, not intractable, without status epilepticus: Secondary | ICD-10-CM

## 2012-10-29 DIAGNOSIS — R1084 Generalized abdominal pain: Secondary | ICD-10-CM

## 2012-10-29 DIAGNOSIS — J4489 Other specified chronic obstructive pulmonary disease: Secondary | ICD-10-CM | POA: Insufficient documentation

## 2012-10-29 DIAGNOSIS — I252 Old myocardial infarction: Secondary | ICD-10-CM | POA: Insufficient documentation

## 2012-10-29 LAB — URINALYSIS, ROUTINE W REFLEX MICROSCOPIC
Ketones, ur: NEGATIVE mg/dL
Nitrite: NEGATIVE
Protein, ur: 100 mg/dL — AB

## 2012-10-29 LAB — COMPREHENSIVE METABOLIC PANEL
Albumin: 3.5 g/dL (ref 3.5–5.2)
Alkaline Phosphatase: 73 U/L (ref 39–117)
BUN: 24 mg/dL — ABNORMAL HIGH (ref 6–23)
Chloride: 101 mEq/L (ref 96–112)
Creatinine, Ser: 1.09 mg/dL (ref 0.50–1.35)
GFR calc Af Amer: 77 mL/min — ABNORMAL LOW (ref >90)
Glucose, Bld: 174 mg/dL — ABNORMAL HIGH (ref 70–99)
Potassium: 4.2 mEq/L (ref 3.5–5.1)
Total Bilirubin: 0.2 mg/dL — ABNORMAL LOW (ref 0.3–1.2)
Total Protein: 6.8 g/dL (ref 6.0–8.3)

## 2012-10-29 LAB — CBC WITH DIFFERENTIAL/PLATELET
Basophils Relative: 0 % (ref 0–1)
Eosinophils Absolute: 0.2 10*3/uL (ref 0.0–0.7)
HCT: 40.7 % (ref 39.0–52.0)
Hemoglobin: 14.3 g/dL (ref 13.0–17.0)
Lymphs Abs: 1.8 10*3/uL (ref 0.7–4.0)
MCH: 28.2 pg (ref 26.0–34.0)
MCHC: 35.1 g/dL (ref 30.0–36.0)
MCV: 80.3 fL (ref 78.0–100.0)
Monocytes Absolute: 0.6 10*3/uL (ref 0.1–1.0)
Monocytes Relative: 8 % (ref 3–12)
Neutrophils Relative %: 67 % (ref 43–77)
RBC: 5.07 MIL/uL (ref 4.22–5.81)

## 2012-10-29 LAB — LIPASE, BLOOD: Lipase: 49 U/L (ref 11–59)

## 2012-10-29 MED ORDER — SODIUM CHLORIDE 0.9 % IV SOLN
1000.0000 mg | INTRAVENOUS | Status: AC
  Administered 2012-10-29: 1000 mg via INTRAVENOUS
  Filled 2012-10-29: qty 10

## 2012-10-29 MED ORDER — DEXTROSE 5 % IV SOLN: 1.0000 g | INTRAVENOUS | Status: DC

## 2012-10-29 MED ORDER — LEVETIRACETAM 500 MG PO TABS: 500.0000 mg | ORAL_TABLET | Freq: Two times a day (BID) | ORAL | Status: DC

## 2012-10-29 MED ORDER — CEPHALEXIN 500 MG PO CAPS: 500.0000 mg | ORAL_CAPSULE | Freq: Two times a day (BID) | ORAL | Status: DC

## 2012-10-29 NOTE — ED Notes (Signed)
Placed  On  cardiac  Monitor  Nasal  02  At  2  l  /  Min        Iv  Ns  tko  20  Angio  r  Hand   1  Att   familty  At  Bedside

## 2012-10-29 NOTE — ED Notes (Signed)
Pt family at bedside stated that pt just had a seizure, pt currently crying.

## 2012-10-29 NOTE — ED Provider Notes (Signed)
History     CSN: [id number]  Arrival date & time 10/29/12  1252   First MD Initiated Contact with Patient 10/29/12 1320      Chief Complaint  Patient presents with  . Seizures    (Consider location/radiation/quality/duration/timing/severity/associated sxs/prior treatment) HPI Comments: 72 y.o PMH left MCA stroke with chronic hemorrhage, seizure like activity presents for complaints family witnessed seizure like activity about 4 hours prior to admission witnessed for 30 minutes.  He denies tongue biting, he did urinate on himself but has urinary incontinence, denies bowel movement during seizure like activity.  Family states his eyes rolled in the back of his head.  He recently was evaluated by Neuro for seizure like activity with neg EEG 2/7 and they did not recommend AED therapy at that time.     He also complains of left back and left abdominal pain and diarrhea.  Pain is 10/10 aching.  No radiation.  Not tried medications.    The history is provided by the patient and a relative. No language interpreter was used.    Past Medical History  Diagnosis Date  . Myocardial infarction   . CVA (cerebral infarction)   . Diabetes mellitus   . Hypertension   . Diabetes mellitus   . Aphasia   . Dysphagia   . COPD (chronic obstructive pulmonary disease)   . Shortness of breath   . Anemia   . Stroke   . Colon cancer   . Hemorrhoid     Past Surgical History  Procedure Laterality Date  . Coronary artery bypass graft    . Appendectomy    . Dental surgery    . Colonoscopy  03/13/2012    Procedure: COLONOSCOPY;  Surgeon: Hart Carwin, MD;  Location: [facility];  Service: Endoscopy;  Laterality: N/A;  . Partial colectomy  03/22/2012    Procedure: PARTIAL COLECTOMY;  Surgeon: Cherylynn Ridges, MD;  Location: [facility] OR;  Service: General;  Laterality: Right;  . Coronary artery bypass graft      x3  . Coronary stent placement      Family History  Problem Relation Age of Onset  . Breast  cancer Mother   . Heart disease Brother   . Colon cancer Maternal Uncle     History  Substance Use Topics  . Smoking status: Current Every Day Smoker -- 16 years    Types: Cigarettes    Last Attempt to Quit: 12/27/2011  . Smokeless tobacco: Never Used  . Alcohol Use: No      Review of Systems  Constitutional: Negative for fever and chills.  Respiratory: Negative for shortness of breath.   Cardiovascular: Negative for chest pain.  Gastrointestinal: Negative for nausea and vomiting.  Genitourinary: Negative for dysuria.  Neurological: Positive for seizures.  All other systems reviewed and are negative.    Allergies  Review of patient's allergies indicates no known allergies.  Home Medications   Current Outpatient Rx  Name  Route  Sig  Dispense  Refill  . albuterol (PROVENTIL HFA;VENTOLIN HFA) 108 (90 BASE) MCG/ACT inhaler   Inhalation   Inhale 1-2 puffs into the lungs every 6 (six) hours as needed for wheezing.   1 Inhaler   0   . aspirin EC 81 MG tablet   Oral   Take 81 mg by mouth daily. For CVA prophylaxis.         Marland Kitchen clopidogrel (PLAVIX) 75 MG tablet   Oral   Take 75 mg by mouth daily.         Marland Kitchen  escitalopram (LEXAPRO) 10 MG tablet   Oral   Take 10 mg by mouth daily.         Marland Kitchen HYDROcodone-acetaminophen (NORCO/VICODIN) 5-325 MG per tablet   Oral   Take 1 tablet by mouth every 6 (six) hours as needed. For pain         . Multiple Vitamin (MULITIVITAMIN WITH MINERALS) TABS   Oral   Take 1 tablet by mouth daily.         . nitroGLYCERIN (NITROSTAT) 0.4 MG SL tablet   Sublingual   Place 0.4 mg under the tongue every 5 (five) minutes x 3 doses as needed. For chest pain         . oxazepam (SERAX) 30 MG capsule   Oral   Take 30 mg by mouth at bedtime.         . polyethylene glycol (MIRALAX) packet   Oral   Take 17 g by mouth 2 (two) times daily.   30 each   0   . senna-docusate (SENOKOT-S) 8.6-50 MG per tablet   Oral   Take 2 tablets by  mouth 2 (two) times daily.   60 tablet   0   . sitaGLIPtan-metformin (JANUMET) 50-1000 MG per tablet   Oral   Take 1 tablet by mouth 2 (two) times daily with a meal.         . valsartan (DIOVAN) 160 MG tablet   Oral   Take 160 mg by mouth daily.         . vitamin C (ASCORBIC ACID) 500 MG tablet   Oral   Take 500 mg by mouth 2 (two) times daily.         Marland Kitchen zinc sulfate 220 MG capsule   Oral   Take 220 mg by mouth daily.         . promethazine (PHENERGAN) 25 MG tablet   Oral   Take 25 mg by mouth every 6 (six) hours as needed. For nausea.           BP 154/71  Pulse 62  Temp(Src) 98.4 F (36.9 C) (Oral)  Resp 17  SpO2 100%  Physical Exam  Nursing note and vitals reviewed. Constitutional: He is oriented to person, place, and time. Vital signs are normal. He appears well-developed and well-nourished. He is cooperative. No distress.  Tearful and agitated intermittently  HENT:  Head: Normocephalic and atraumatic.  Mouth/Throat: Oropharynx is clear and moist and mucous membranes are normal. No oropharyngeal exudate.  No teeth  Eyes: Conjunctivae are normal. Pupils are equal, round, and reactive to light. Right eye exhibits no discharge. Left eye exhibits no discharge. No scleral icterus.  Cardiovascular: Normal rate, regular rhythm, S1 normal, S2 normal and normal heart sounds.   No murmur heard. No edema lower ext b/l  Pulmonary/Chest: Effort normal and breath sounds normal. No respiratory distress. He has no wheezes.  Abdominal: Soft. Bowel sounds are normal. He exhibits no distension. There is tenderness in the left upper quadrant and left lower quadrant. There is CVA tenderness.  Left CVA ttp Obese ab  Neurological: He is alert and oriented to person, place, and time.  Skin: Skin is warm, dry and intact. No rash noted. He is not diaphoretic.  Psychiatric: He is agitated.  Tearful, crying, agitated  +aphasia, slurred speech from previous stroke    ED Course   Procedures (including critical care time)  Labs Reviewed  COMPREHENSIVE METABOLIC PANEL - Abnormal; Notable for the following:  Glucose, Bld 174 (*)    BUN 24 (*)    Total Bilirubin 0.2 (*)    GFR calc non Af Amer 67 (*)    GFR calc Af Amer 77 (*)    All other components within normal limits  URINALYSIS, ROUTINE W REFLEX MICROSCOPIC - Abnormal; Notable for the following:    APPearance CLOUDY (*)    Glucose, UA 250 (*)    Hgb urine dipstick SMALL (*)    Protein, ur 100 (*)    Leukocytes, UA SMALL (*)    All other components within normal limits  CLOSTRIDIUM DIFFICILE BY PCR  URINE CULTURE  CBC WITH DIFFERENTIAL  LIPASE, BLOOD  URINE MICROSCOPIC-ADD ON   No results found.   1. Abdominal pain   2. Diarrhea   3. Back pain   4. Seizure-like activity      Date: 10/29/2012  Rate: 69  Rhythm: normal sinus rhythm  QRS Axis: normal  Intervals: normal  ST/T Wave abnormalities: normal  Conduction Disutrbances:none  Narrative Interpretation:   Old EKG Reviewed: unchanged   MDM  1. Abdominal pain, diarrhea CMET, lipase, UA, Cdiff  2. Back pain +CVA ttp will r/o UTI  3. Seizure like activity  2/7 EEG:  Mnimal generalized nonspecific slowing of cerebral activity. Such slowing can be seen with degenerative as well as metabolic encephalopathies. No evidence of an epileptic disorder was demonstrated Neuro evaluated 2/7 and did not recommend AED at that time   Spoke with Dr. Roseanne Reno who recommends Keppra 1 g iv load and start 500 mg tonight bid  4. Possible UTI Rx Rocephin    Shirlee Latch MD [phone]        Annett Gula, MD 10/29/12 1451  Annett Gula, MD 10/29/12 1453  Annett Gula, MD 10/29/12 [phone]

## 2012-10-29 NOTE — ED Notes (Signed)
Pt  Noted to have  Been  Incontinent  Of   Urine          He  Apparently had  A  Witnessed    Seizure  4 hours  Ago  Per  Family

## 2012-10-29 NOTE — ED Provider Notes (Signed)
Medical screening examination/treatment/procedure(s) were performed by resident physician or non-physician practitioner and as supervising physician I was immediately available for consultation/collaboration.   Rachit Grim DOUGLAS MD.   Ruffus Kamaka D Guinn Delarosa, MD 10/29/12 1351 

## 2012-10-29 NOTE — ED Notes (Signed)
Pt  Reports  llq  Pain        With  Nausea      He  denys      Any  Chest pain            Pt  Is  A  Smoker      With the  Odor   Of  Nicotine      On  His  Body    -   Pt  Was  Apparently   in  Er   2  Days  Ago         Pt  Ambulated  To   Exam  Room   With a  Slow  Gait  He  Answers  questians       His  Skin is  Warm  And  Dry

## 2012-10-29 NOTE — ED Notes (Signed)
Per Family pt. Has c/o having a Sz. 4 hours ago that lasted for 30 minutes.  Pt. Smoked cigarettes on the way to the Urgent Care and has been seen twice in the ED this week for the same. EMS was called to transport the pt. "in case he had another sz."

## 2012-10-29 NOTE — ED Provider Notes (Signed)
I saw and evaluated the patient, reviewed the resident's note and I agree with the findings and plan. Agree with EKG interpretation if present.   Pt with seizure like episodes lasting for a few seconds and resolving spontaneously with no post-ictal phase. Seen the ED for same about 4 days ago and admitted with normal EEG. Also having several other chronic complaints including abdominal pain. Per Dr. Roseanne Reno, will begin Keppra. Will need treatment for UTI. No indication for readmission. PCP follow up.   Charles B. Bernette Mayers, MD 10/29/12 1535

## 2012-10-29 NOTE — ED Notes (Signed)
Tried  To   Phone  Report       To  Charge nurse        Was  Told  She  Was   In  A level 1  Trauma               And  She would  Call  us  Back       Meanwhile   Report  Was         Phoned  To  Care  Link           -  No  Signs  Of  Any  Seizure activity  At this  Time

## 2012-10-29 NOTE — ED Provider Notes (Signed)
History     CSN: [id number]  Arrival date & time 10/29/12  1117   None     Chief Complaint  Patient presents with  . Abdominal Pain    (Consider location/radiation/quality/duration/timing/severity/associated sxs/prior treatment) HPI Comments: 72 year old male presents with his family states that he had a grand mal type seizure this morning. They witnessed him having a tonic-clonic movements, his eyes rolled back in his head and he was temporarily unconscious for period of time. He was incontinent of urine. He has a history of MI, CAD, diabetes, hypertension, CVA with residual aphasia, dysphasia and right-sided weakness, dyspnea, anemia, colon cancer and a surgical history including CABG, partial colectomy and appendectomy. He is currently awake and answers questions with yes and no and not to the head. He is alert and appears to understand questions. He is complaining of his usual and chronic lower sternal pain as well as diffuse abdominal pain. This is similar to his evaluation in the emergency department 4 days ago. Family states he remains constipated without relief with his MiraLax.  Patient is a 72 y.o. male presenting with abdominal pain.  Abdominal Pain Associated symptoms: chest pain and constipation   Associated symptoms: no cough, no fever, no nausea and no shortness of breath     Past Medical History  Diagnosis Date  . Myocardial infarction   . CVA (cerebral infarction)   . Diabetes mellitus   . Hypertension   . Diabetes mellitus   . Aphasia   . Dysphagia   . COPD (chronic obstructive pulmonary disease)   . Shortness of breath   . Anemia   . Stroke   . Colon cancer   . Hemorrhoid     Past Surgical History  Procedure Laterality Date  . Coronary artery bypass graft    . Appendectomy    . Dental surgery    . Colonoscopy  03/13/2012    Procedure: COLONOSCOPY;  Surgeon: Hart Carwin, MD;  Location: [facility];  Service: Endoscopy;  Laterality: N/A;  . Partial  colectomy  03/22/2012    Procedure: PARTIAL COLECTOMY;  Surgeon: Cherylynn Ridges, MD;  Location: [facility] OR;  Service: General;  Laterality: Right;  . Coronary artery bypass graft      x3  . Coronary stent placement      Family History  Problem Relation Age of Onset  . Breast cancer Mother   . Heart disease Brother   . Colon cancer Maternal Uncle     History  Substance Use Topics  . Smoking status: Current Every Day Smoker -- 16 years    Types: Cigarettes    Last Attempt to Quit: 12/27/2011  . Smokeless tobacco: Never Used  . Alcohol Use: No      Review of Systems  Constitutional: Positive for activity change and appetite change. Negative for fever.  HENT: Negative.   Respiratory: Negative for cough, shortness of breath and wheezing.   Cardiovascular: Positive for chest pain.  Gastrointestinal: Positive for abdominal pain and constipation. Negative for nausea.  Musculoskeletal: Positive for arthralgias and gait problem.  Neurological: Positive for facial asymmetry and speech difficulty.       Neurologic chronic status post CVA    Allergies  Review of patient's allergies indicates no known allergies.  Home Medications   Current Outpatient Rx  Name  Route  Sig  Dispense  Refill  . albuterol (PROVENTIL HFA;VENTOLIN HFA) 108 (90 BASE) MCG/ACT inhaler   Inhalation   Inhale 1-2 puffs into the lungs every  6 (six) hours as needed for wheezing.   1 Inhaler   0   . aspirin EC 81 MG tablet   Oral   Take 81 mg by mouth daily. For CVA prophylaxis.         Marland Kitchen clopidogrel (PLAVIX) 75 MG tablet   Oral   Take 75 mg by mouth daily.         Marland Kitchen escitalopram (LEXAPRO) 10 MG tablet   Oral   Take 10 mg by mouth daily.         . food thickener (THICK IT) POWD   Oral   Take by mouth as needed.         . Multiple Vitamin (MULITIVITAMIN WITH MINERALS) TABS   Oral   Take 1 tablet by mouth daily.         . nitroGLYCERIN (NITROSTAT) 0.4 MG SL tablet   Sublingual   Place 0.4 mg  under the tongue every 5 (five) minutes x 3 doses as needed. For chest pain         . oxazepam (SERAX) 30 MG capsule   Oral   Take 30 mg by mouth at bedtime.         . polyethylene glycol (MIRALAX) packet   Oral   Take 17 g by mouth 2 (two) times daily.   30 each   0   . promethazine (PHENERGAN) 25 MG tablet   Oral   Take 25 mg by mouth every 6 (six) hours as needed. For nausea.         Marland Kitchen senna-docusate (SENOKOT-S) 8.6-50 MG per tablet   Oral   Take 2 tablets by mouth 2 (two) times daily.   60 tablet   0   . sitaGLIPtan-metformin (JANUMET) 50-1000 MG per tablet   Oral   Take 1 tablet by mouth 2 (two) times daily with a meal.         . valsartan (DIOVAN) 160 MG tablet   Oral   Take 160 mg by mouth daily.         . vitamin C (ASCORBIC ACID) 500 MG tablet   Oral   Take 500 mg by mouth 2 (two) times daily.         Marland Kitchen zinc sulfate 220 MG capsule   Oral   Take 220 mg by mouth daily.           BP 142/76  Pulse 72  Temp(Src) 98.6 F (37 C) (Oral)  Resp 18  SpO2 97%  Physical Exam  Constitutional:  Elderly male mostly bedridden and dependent on others is lying in the bed currently in no acute distress. He has evidence of urinary continence otherwise stable.  HENT:  Mouth/Throat: No oropharyngeal exudate.  Cardiovascular: Normal rate, regular rhythm and normal heart sounds.   Pulmonary/Chest: Effort normal and breath sounds normal. No respiratory distress. He has no wheezes.  Abdominal: There is tenderness.  Generalized abdominal tenderness.  Musculoskeletal:  Right hemiparesis with decreased strength and movement. No particular tenderness.  Lymphadenopathy:    He has no cervical adenopathy.  Neurological: He is alert. Coordination abnormal.  Skin: Skin is warm and dry. There is pallor.    ED Course  Procedures (including critical care time)  Labs Reviewed - No data to display No results found.   1. Seizure disorder   2. Abdominal pain,  generalized   3. Chest pain at rest   4. CAD (coronary artery disease), autologous vein bypass graft  MDM  This 13-year-old male is being transferred to the emergency department for his multiple daily chronic complaints. Apparently had a tonic-clonic seizure with urinary incontinence this morning. He is also complaining of chest pain and abdominal pain for which he has been evaluated in the ED twice in the past week and a half. He is currently not being pharmacologically treated for a seizure disorder. Apparently, there was some question as to the validity of seizure activity. He is relatively stable at this point, fully alert and awake and not having any seizure activity in the urgent care. He has multiple acute and chronic complaints and diseases including cardiovascular and cerebrovascular disease.         Hayden Rasmussen, NP 10/29/12 1214

## 2012-10-30 ENCOUNTER — Emergency Department (HOSPITAL_COMMUNITY)
Admission: EM | Admit: 2012-10-30 | Discharge: 2012-10-31 | Disposition: A | Discharge status: Home / Self Care | Payer: Medicare Other | Attending: Emergency Medicine | Admitting: Emergency Medicine

## 2012-10-30 DIAGNOSIS — N390 Urinary tract infection, site not specified: Secondary | ICD-10-CM | POA: Insufficient documentation

## 2012-10-30 DIAGNOSIS — E119 Type 2 diabetes mellitus without complications: Secondary | ICD-10-CM | POA: Insufficient documentation

## 2012-10-30 DIAGNOSIS — Z862 Personal history of diseases of the blood and blood-forming organs and certain disorders involving the immune mechanism: Secondary | ICD-10-CM | POA: Insufficient documentation

## 2012-10-30 DIAGNOSIS — F17200 Nicotine dependence, unspecified, uncomplicated: Secondary | ICD-10-CM | POA: Insufficient documentation

## 2012-10-30 DIAGNOSIS — Z8673 Personal history of transient ischemic attack (TIA), and cerebral infarction without residual deficits: Secondary | ICD-10-CM | POA: Insufficient documentation

## 2012-10-30 DIAGNOSIS — Z79899 Other long term (current) drug therapy: Secondary | ICD-10-CM | POA: Insufficient documentation

## 2012-10-30 DIAGNOSIS — Z85038 Personal history of other malignant neoplasm of large intestine: Secondary | ICD-10-CM | POA: Insufficient documentation

## 2012-10-30 DIAGNOSIS — I252 Old myocardial infarction: Secondary | ICD-10-CM | POA: Insufficient documentation

## 2012-10-30 DIAGNOSIS — Z8719 Personal history of other diseases of the digestive system: Secondary | ICD-10-CM | POA: Insufficient documentation

## 2012-10-30 DIAGNOSIS — J441 Chronic obstructive pulmonary disease with (acute) exacerbation: Secondary | ICD-10-CM | POA: Insufficient documentation

## 2012-10-30 DIAGNOSIS — Z7902 Long term (current) use of antithrombotics/antiplatelets: Secondary | ICD-10-CM | POA: Insufficient documentation

## 2012-10-30 DIAGNOSIS — J45901 Unspecified asthma with (acute) exacerbation: Secondary | ICD-10-CM | POA: Insufficient documentation

## 2012-10-30 DIAGNOSIS — Z7982 Long term (current) use of aspirin: Secondary | ICD-10-CM | POA: Insufficient documentation

## 2012-10-30 LAB — URINALYSIS, ROUTINE W REFLEX MICROSCOPIC
Glucose, UA: 250 mg/dL — AB
Specific Gravity, Urine: 1.013 (ref 1.005–1.030)
Urobilinogen, UA: 0.2 mg/dL (ref 0.0–1.0)

## 2012-10-30 LAB — URINE CULTURE: Colony Count: NO GROWTH

## 2012-10-30 LAB — URINE MICROSCOPIC-ADD ON

## 2012-10-30 LAB — CBC
MCH: 28.2 pg (ref 26.0–34.0)
MCHC: 35.2 g/dL (ref 30.0–36.0)
MCV: 80.2 fL (ref 78.0–100.0)
Platelets: 152 10*3/uL (ref 150–400)
RDW: 13.2 % (ref 11.5–15.5)

## 2012-10-30 NOTE — ED Provider Notes (Addendum)
History     CSN: [id number]  Arrival date & time 10/30/12  2303   First MD Initiated Contact with Patient 10/30/12 2308      No chief complaint on file.   (Consider location/radiation/quality/duration/timing/severity/associated sxs/prior treatment) Patient is a 72 y.o. male presenting with abdominal pain. The history is provided by the patient.  Abdominal Pain Pain location:  RLQ Pain quality: aching   Pain radiates to:  Does not radiate Pain severity:  No pain Onset quality:  Gradual Timing:  Constant Progression:  Resolved Chronicity:  Recurrent Relieved by:  Nothing Worsened by:  Nothing tried Ineffective treatments:  None tried   Past Medical History  Diagnosis Date  . Myocardial infarction   . CVA (cerebral infarction)   . Diabetes mellitus   . Hypertension   . Diabetes mellitus   . Aphasia   . Dysphagia   . COPD (chronic obstructive pulmonary disease)   . Shortness of breath   . Anemia   . Stroke   . Colon cancer   . Hemorrhoid     Past Surgical History  Procedure Laterality Date  . Coronary artery bypass graft    . Appendectomy    . Dental surgery    . Colonoscopy  03/13/2012    Procedure: COLONOSCOPY;  Surgeon: Hart Carwin, MD;  Location: [facility];  Service: Endoscopy;  Laterality: N/A;  . Partial colectomy  03/22/2012    Procedure: PARTIAL COLECTOMY;  Surgeon: Cherylynn Ridges, MD;  Location: [facility];  Service: General;  Laterality: Right;  . Coronary artery bypass graft      x3  . Coronary stent placement      Family History  Problem Relation Age of Onset  . Breast cancer Mother   . Heart disease Brother   . Colon cancer Maternal Uncle     History  Substance Use Topics  . Smoking status: Current Every Day Smoker -- 16 years    Types: Cigarettes    Last Attempt to Quit: 12/27/2011  . Smokeless tobacco: Never Used  . Alcohol Use: No      Review of Systems  Gastrointestinal: Positive for abdominal pain.  All other systems reviewed  and are negative.    Allergies  Review of patient's allergies indicates no known allergies.  Home Medications   Current Outpatient Rx  Name  Route  Sig  Dispense  Refill  . albuterol (PROVENTIL HFA;VENTOLIN HFA) 108 (90 BASE) MCG/ACT inhaler   Inhalation   Inhale 1-2 puffs into the lungs every 6 (six) hours as needed for wheezing.   1 Inhaler   0   . aspirin EC 81 MG tablet   Oral   Take 81 mg by mouth daily. For CVA prophylaxis.         . cephALEXin (KEFLEX) 500 MG capsule   Oral   Take 1 capsule (500 mg total) by mouth 2 (two) times daily.   10 capsule   0   . clopidogrel (PLAVIX) 75 MG tablet   Oral   Take 75 mg by mouth daily.         Marland Kitchen escitalopram (LEXAPRO) 10 MG tablet   Oral   Take 10 mg by mouth daily.         Marland Kitchen HYDROcodone-acetaminophen (NORCO/VICODIN) 5-325 MG per tablet   Oral   Take 1 tablet by mouth every 6 (six) hours as needed. For pain         . levETIRAcetam (KEPPRA) 500 MG tablet  Oral   Take 1 tablet (500 mg total) by mouth every 12 (twelve) hours.   60 tablet   1   . Multiple Vitamin (MULITIVITAMIN WITH MINERALS) TABS   Oral   Take 1 tablet by mouth daily.         . nitroGLYCERIN (NITROSTAT) 0.4 MG SL tablet   Sublingual   Place 0.4 mg under the tongue every 5 (five) minutes x 3 doses as needed. For chest pain         . oxazepam (SERAX) 30 MG capsule   Oral   Take 30 mg by mouth at bedtime.         . polyethylene glycol (MIRALAX) packet   Oral   Take 17 g by mouth 2 (two) times daily.   30 each   0   . promethazine (PHENERGAN) 25 MG tablet   Oral   Take 25 mg by mouth every 6 (six) hours as needed. For nausea.         Marland Kitchen senna-docusate (SENOKOT-S) 8.6-50 MG per tablet   Oral   Take 2 tablets by mouth 2 (two) times daily.   60 tablet   0   . sitaGLIPtan-metformin (JANUMET) 50-1000 MG per tablet   Oral   Take 1 tablet by mouth 2 (two) times daily with a meal.         . valsartan (DIOVAN) 160 MG tablet    Oral   Take 160 mg by mouth daily.         . vitamin C (ASCORBIC ACID) 500 MG tablet   Oral   Take 500 mg by mouth 2 (two) times daily.         Marland Kitchen zinc sulfate 220 MG capsule   Oral   Take 220 mg by mouth daily.           There were no vitals taken for this visit.  Physical Exam  Constitutional: He is oriented to person, place, and time. He appears well-developed and well-nourished.  HENT:  Head: Normocephalic and atraumatic.  Eyes: Conjunctivae are normal. Pupils are equal, round, and reactive to light.  Neck: Normal range of motion. Neck supple.  Cardiovascular: Normal rate, regular rhythm, normal heart sounds and intact distal pulses.   Pulmonary/Chest: Effort normal and breath sounds normal.  Abdominal: Soft. Bowel sounds are normal.  Neurological: He is alert and oriented to person, place, and time.  Skin: Skin is warm and dry.  Psychiatric: He has a normal mood and affect. His behavior is normal. Judgment and thought content normal.    ED Course  Procedures (including critical care time)  Labs Reviewed  CBC  COMPREHENSIVE METABOLIC PANEL  URINALYSIS, ROUTINE W REFLEX MICROSCOPIC   No results found.   No diagnosis found.    Date: 10/30/2012  Rate: 71  Rhythm: normal sinus rhythm  QRS Axis: normal  Intervals: normal  ST/T Wave abnormalities: normal  Conduction Disutrbances: none  Narrative Interpretation: unremarkable    MDM  Pt states started urinating and couldn't stop.  No pain now.  Will lab,  Reassess.    + uti with fungal elements.  Will abx,  Antifungal,  Culture.  Dc to oupt fu,  Ret new/worsening sxs      Alice Vitelli Lytle Michaels, MD 10/30/12 2311  Annjeanette Sarwar Lytle Michaels, MD 10/30/12 6578  Rosanne Ashing, MD 10/31/12 4696

## 2012-10-30 NOTE — ED Notes (Signed)
Per EMS: Pt from home c/o urinating without being able to stop, along with lower abdominal pain. Pt denies any c/o at this time. VSS.

## 2012-10-30 NOTE — ED Notes (Signed)
Pt poor historian. Oriented to self, situation. Denies any complaint at this time. Follows commands.

## 2012-10-31 LAB — COMPREHENSIVE METABOLIC PANEL
AST: 12 U/L (ref 0–37)
Albumin: 3.2 g/dL — ABNORMAL LOW (ref 3.5–5.2)
BUN: 20 mg/dL (ref 6–23)
Calcium: 9.6 mg/dL (ref 8.4–10.5)
Creatinine, Ser: 1.13 mg/dL (ref 0.50–1.35)

## 2012-10-31 MED ORDER — FLUCONAZOLE 200 MG PO TABS: 100.0000 mg | ORAL_TABLET | Freq: Every day | ORAL | Status: AC

## 2012-10-31 MED ORDER — FLUCONAZOLE 100 MG PO TABS
200.0000 mg | ORAL_TABLET | Freq: Once | ORAL | Status: AC
Start: 2012-10-31 — End: 2012-10-31
  Administered 2012-10-31: 200 mg via ORAL
  Filled 2012-10-31: qty 2

## 2012-10-31 MED ORDER — CIPROFLOXACIN HCL 500 MG PO TABS: 500.0000 mg | ORAL_TABLET | Freq: Two times a day (BID) | ORAL | Status: DC

## 2012-10-31 MED ORDER — DEXTROSE 5 % IV SOLN
1.0000 g | Freq: Once | INTRAVENOUS | Status: AC
  Administered 2012-10-31: 1 g via INTRAVENOUS
  Filled 2012-10-31: qty 10

## 2012-10-31 NOTE — ED Notes (Signed)
Family contacted and unable to pick up pt. Pt made aware. [facility] called for transfer home. NAD. VSS. DC instructions provided to pt.

## 2012-11-01 LAB — URINE CULTURE
Colony Count: NO GROWTH
Culture: NO GROWTH

## 2012-11-25 DIAGNOSIS — R269 Unspecified abnormalities of gait and mobility: Secondary | ICD-10-CM | POA: Insufficient documentation

## 2012-11-25 DIAGNOSIS — I679 Cerebrovascular disease, unspecified: Secondary | ICD-10-CM | POA: Insufficient documentation

## 2012-11-25 DIAGNOSIS — G40319 Generalized idiopathic epilepsy and epileptic syndromes, intractable, without status epilepticus: Secondary | ICD-10-CM | POA: Insufficient documentation

## 2012-11-25 DIAGNOSIS — R4701 Aphasia: Secondary | ICD-10-CM | POA: Insufficient documentation

## 2012-12-18 ENCOUNTER — Other Ambulatory Visit: Payer: Self-pay | Admitting: *Deleted

## 2013-01-16 ENCOUNTER — Telehealth: Payer: Self-pay | Admitting: Neurology

## 2013-01-16 MED ORDER — TOPIRAMATE 25 MG PO TABS: 25.0000 mg | ORAL_TABLET | Freq: Two times a day (BID) | ORAL | Status: DC

## 2013-01-16 NOTE — Telephone Encounter (Signed)
I called the daughter. The patient has been having daily headaches, usually in the morning. The patient is still having seizures, averaging 1 a week. I'll add Topamax for the seizures and for the headache.

## 2013-01-16 NOTE — Telephone Encounter (Signed)
Message copied by Stephanie Acre on Thu Jan 16, 2013  5:46 PM ------      Message from: [facility], MONICA L      Created: Thu Jan 16, 2013  9:27 AM      Contact: [name]/daugher       Pt's daughter Kathie Rhodes came in the office today and would like for you to give her a call regarding her father Mr. Wingert.  She states he is getting worse everyday with the headaches.  She just would like for you to call her about this. She can be reached @ [phone]. ------

## 2013-04-04 ENCOUNTER — Encounter: Payer: Self-pay | Admitting: Internal Medicine

## 2013-04-11 ENCOUNTER — Encounter: Payer: Self-pay | Admitting: Neurology

## 2013-04-11 DIAGNOSIS — I679 Cerebrovascular disease, unspecified: Secondary | ICD-10-CM

## 2013-04-11 DIAGNOSIS — R4701 Aphasia: Secondary | ICD-10-CM

## 2013-04-11 DIAGNOSIS — G40319 Generalized idiopathic epilepsy and epileptic syndromes, intractable, without status epilepticus: Secondary | ICD-10-CM

## 2013-04-11 DIAGNOSIS — R269 Unspecified abnormalities of gait and mobility: Secondary | ICD-10-CM

## 2013-04-14 ENCOUNTER — Ambulatory Visit: Payer: Self-pay | Admitting: Neurology

## 2013-04-28 ENCOUNTER — Encounter: Payer: Self-pay | Admitting: Internal Medicine

## 2013-05-01 ENCOUNTER — Encounter: Payer: Self-pay | Admitting: Internal Medicine

## 2013-05-01 ENCOUNTER — Ambulatory Visit (INDEPENDENT_AMBULATORY_CARE_PROVIDER_SITE_OTHER): Payer: Medicare Other | Admitting: Internal Medicine

## 2013-05-01 VITALS — BP 156/80 | HR 80 | Ht 64.0 in | Wt 161.0 lb

## 2013-05-01 DIAGNOSIS — K5900 Constipation, unspecified: Secondary | ICD-10-CM

## 2013-05-01 DIAGNOSIS — R9389 Abnormal findings on diagnostic imaging of other specified body structures: Secondary | ICD-10-CM

## 2013-05-01 DIAGNOSIS — Z7902 Long term (current) use of antithrombotics/antiplatelets: Secondary | ICD-10-CM

## 2013-05-01 DIAGNOSIS — C19 Malignant neoplasm of rectosigmoid junction: Secondary | ICD-10-CM

## 2013-05-01 MED ORDER — LUBIPROSTONE 8 MCG PO CAPS: 8.0000 ug | ORAL_CAPSULE | Freq: Two times a day (BID) | ORAL | Status: DC

## 2013-05-01 NOTE — Progress Notes (Signed)
Subjective:    Patient ID: [name], male    DOB: 07-29-1941, 72 y.o.   MRN: [id number]  HPI [name] is a 72 yo male with PMH of stroke in 2013, MI in 2013, insulin-dependent diabetes, hypertension, COPD, and colon cancer status post right hemicolectomy in July of 2013 who is seen for followup. He is here today with his daughter. He reports he is still having trouble with constipation and associated abdominal distention. At times he feels like there is a "knot in his stomach". He reports having a bowel movement every 2 weeks. When he does have about a minute can be loose and watery. He has a bowel movement he feels improved abdominal distention and overall feels better. He is using MiraLax 17 g twice a day, and in the past has used senna-s but is not at present.  He reports that he is eating very well with good appetite. No nausea or vomiting. No dysphagia or odynophagia. No early satiety. No melena or rectal bleeding. He was seen in the emergency department for abdominal distention in January, had a CT scan, but no further followup. There were some abnormalities in his bladder on this imaging test which were not evaluated per the daughter.  He reports occasional difficulty initiating urination but no dysuria or hematuria  Review of Systems As per history of present illness, otherwise negative    Objective:   Physical Exam BP 156/80  Pulse 80  Ht 5\' 4"  (1.626 m)  Wt 161 lb (73.029 kg)  BMI 27.62 kg/m2 Constitutional: Chronically ill-appearing and somewhat disheveled male in no acute distress HEENT: Normocephalic and atraumatic. Poor dentition, Oropharynx is clear and moist. No oropharyngeal exudate. Conjunctivae are normal.  No scleral icterus. Cardiovascular: Normal rate, regular rhythm and intact distal pulses.  Pulmonary/chest: Effort normal and breath sounds normal. Scattered rhonchi without wheezing or rales Abdominal: Soft, well-healed midline abdominal scar with possible  incisional hernia to the right no evidence of incarceration, nontender, nondistended. Bowel sounds active throughout. No palpable masses Extremities: no clubbing, cyanosis, or trace pretibial edema. Neurological: Alert and oriented to person place and time, garbled speech Skin: Skin is warm and dry. No rashes noted.  CBC    Component Value Date/Time   WBC 6.8 10/30/2012 2320   RBC 4.75 10/30/2012 2320   HGB 13.4 10/30/2012 2320   HCT 38.1* 10/30/2012 2320   PLT 152 10/30/2012 2320   MCV 80.2 10/30/2012 2320   MCH 28.2 10/30/2012 2320   MCHC 35.2 10/30/2012 2320   RDW 13.2 10/30/2012 2320   LYMPHSABS 1.8 10/29/2012 1327   MONOABS 0.6 10/29/2012 1327   EOSABS 0.2 10/29/2012 1327   BASOSABS 0.0 10/29/2012 1327   CMP     Component Value Date/Time   NA 139 10/30/2012 2320   K 4.1 10/30/2012 2320   CL 103 10/30/2012 2320   CO2 28 10/30/2012 2320   GLUCOSE 219* 10/30/2012 2320   BUN 20 10/30/2012 2320   CREATININE 1.13 10/30/2012 2320   CALCIUM 9.6 10/30/2012 2320   PROT 6.4 10/30/2012 2320   ALBUMIN 3.2* 10/30/2012 2320   AST 12 10/30/2012 2320   ALT 10 10/30/2012 2320   ALKPHOS 71 10/30/2012 2320   BILITOT 0.2* 10/30/2012 2320   GFRNONAA 64* 10/30/2012 2320   GFRAA 74* 10/30/2012 2320    CT ABDOMEN AND PELVIS WITHOUT CONTRAST -- Jan 2014   Technique:  Multidetector CT imaging of the abdomen and pelvis was performed following the standard protocol without intravenous  contrast.   Comparison: CT of the abdomen and pelvis performed 03/11/2012   Findings: Mild bibasilar atelectasis is noted.  Diffuse coronary artery calcifications are seen, and mild calcification is noted at the aortic valve.  The patient is status post median sternotomy, with nonunion of the inferior sternum.   The liver and spleen are unremarkable in appearance.  Scattered stones are seen dependently within the gallbladder; the gallbladder is otherwise unremarkable in appearance.  The pancreas and adrenal glands are  unremarkable.   There is right-sided renal pelvicaliectasis, with slight prominence of the right ureter.  This is perhaps slightly more prominent than on the prior study, but no distal obstructing stone is seen.  A thin 7 mm stone is noted dependently in the bladder, possibly reflecting a recently passed stone.   Nonspecific perinephric stranding is again noted bilaterally.  A large 1.0 cm stone is again noted at the lower pole of the right kidney.  Scattered bilateral renal cysts are grossly stable in appearance, including a mildly complex 2.1 cm cyst in the upper pole of the left kidney, demonstrating minimal calcification.   No free fluid is identified.  The small bowel is unremarkable in appearance.  The stomach is within normal limits.  No acute vascular abnormalities are seen.  Scattered calcification is noted along the abdominal aorta, and along both proximal renal arteries.   The patient is status post appendectomy.  Contrast progresses to the splenic flexure of the colon.  The colon is partially filled with stool and is unremarkable in appearance.  There is no definite evidence for significant constipation.   The bladder is mildly distended.  There is new mild irregular bladder wall thickening along the left side of the bladder. Cystoscopy could be considered for further evaluation, to exclude malignancy.  The prostate is mildly enlarged, measuring 5.4 cm in transverse dimension.  No inguinal lymphadenopathy is seen.   No acute osseous abnormalities are identified.  Mild facet hypertrophy is noted at the lower lumbar spine.   IMPRESSION:   1.  No definite evidence for significant constipation.  The colon is partially filled with stool and is unremarkable in appearance. Contrast has progressed to the splenic flexure of the colon. 2.  New mild irregular bladder wall thickening along the left side of the bladder.  Cystoscopy could be considered for further evaluation, to  exclude malignancy. 3.  Right-sided renal pelvicaliectasis, with slight prominence of the right ureter; this appears slightly more prominent than on the prior study, without evidence of a distal obstructing stone.  A thin 7 mm stone dependently in the bladder may reflect a recently passed stone. 4.  Mildly enlarged prostate. 5.  Scattered calcification along the abdominal aorta, and along both proximal renal arteries. 6.  1.0 cm nonobstructing stone again noted at the lower pole of the right kidney; scattered bilateral renal cysts again seen, grossly stable in appearance. 7.  Diffuse coronary artery calcifications noted.  Status post median sternotomy, with nonunion of the inferior sternum. 8.  Mild bibasilar atelectasis noted. 9.  Cholelithiasis noted; gallbladder otherwise unremarkable in appearance.       Assessment & Plan:  72 yo male with PMH of stroke in 2013, MI in 2013, insulin-dependent diabetes, hypertension, COPD, and colon cancer status post right hemicolectomy in July of 2013 who is seen for followup.  1.  Hx of colon cancer s/p right hemicolectomy for early stage disease felt to be cancer free at oncology followup in November 2013/constipation -- his biggest  complaint is constipation and associated abdominal bloating/discomfort. He is not benefiting from MiraLax 17 g twice daily at this time. I have recommended lubiprostone 8 mcg twice daily. He can continue MiraLax for now though he may be a little discontinue this if he has a good response to lubiprostone. He is due for surveillance colonoscopy at this time, but he would like to work on his constipation before proceeding to colonoscopy. I will see him back in one month for reassessment and to discuss scheduling his surveillance colonoscopy. His Plavix will need to be held before this procedure  2.  Bladder wall thickening -- I have recommended urology referral to evaluate the abnormality seen on the abdominal CT scan from  January 2014. He does have a history of tobacco use which is ongoing which is a risk factor for bladder cancer.  3.  Tobacco use -- we discussed tobacco cessation today and he was counseled on the need for quitting. At this time is not interested in stopping.  Return in one month

## 2013-05-01 NOTE — Patient Instructions (Addendum)
You have been given a separate informational sheet regarding your tobacco use, the importance of quitting and local resources to help you quit.   We have sent the following medications to your pharmacy for you to pick up at your convenience: Amitiza; please take as directed  You have been referred to [facility] located [address] [place], [place], [place] [address].   Your appointment is scheduled on 05/27/2013 @  2:45pm please arrive at 2:30 with Dr. Mena Goes  If you cannot make this appointment and need to reschedule please call [phone]                                                We are excited to introduce MyChart, a new best-in-class service that provides you online access to important information in your electronic medical record. We want to make it easier for you to view your health information - all in one secure location - when and where you need it. We expect MyChart will enhance the quality of care and service we provide.  When you register for MyChart, you can:    View your test results.    Request appointments and receive appointment reminders via email.    Request medication renewals.    View your medical history, allergies, medications and immunizations.    Communicate with your physician's office through a password-protected site.    Conveniently print information such as your medication lists.  To find out if MyChart is right for you, please talk to a member of our clinical staff today. We will gladly answer your questions about this free health and wellness tool.  If you are age 71 or older and want a member of your family to have access to your record, you must provide written consent by completing a proxy form available at our office. Please speak to our clinical staff about guidelines regarding accounts for patients younger than age 71.  As you activate your MyChart account and need any technical assistance, please call the MyChart technical support line at [phone]) or email your question to [email or web address]. If you email your question(s), please include your name, a return phone number and the best time to reach you.  If you have non-urgent health-related questions, you can send a message to our office through MyChart at [email or web address]. If you have a medical emergency, call 911.  Thank you for using MyChart as your new health and wellness resource!   MyChart licensed from [employer],  [id number]. Patents Pending.

## 2013-05-20 ENCOUNTER — Telehealth: Payer: Self-pay | Admitting: Internal Medicine

## 2013-05-21 ENCOUNTER — Telehealth: Payer: Self-pay | Admitting: Gastroenterology

## 2013-05-21 NOTE — Telephone Encounter (Signed)
Prior authorization for Amitiza 8 mc bid approved, [employer] will let the pt know. Medication is approved for 1 year.

## 2013-06-26 NOTE — Telephone Encounter (Signed)
Note done 05/21/13

## 2013-08-25 ENCOUNTER — Emergency Department (HOSPITAL_COMMUNITY): Payer: Medicare Other

## 2013-08-25 ENCOUNTER — Emergency Department (HOSPITAL_COMMUNITY)
Admission: EM | Admit: 2013-08-25 | Discharge: 2013-08-25 | Disposition: A | Discharge status: Home / Self Care | Payer: Medicare Other | Attending: Emergency Medicine | Admitting: Emergency Medicine

## 2013-08-25 ENCOUNTER — Encounter (HOSPITAL_COMMUNITY): Payer: Self-pay | Admitting: Emergency Medicine

## 2013-08-25 DIAGNOSIS — J449 Chronic obstructive pulmonary disease, unspecified: Secondary | ICD-10-CM | POA: Insufficient documentation

## 2013-08-25 DIAGNOSIS — R350 Frequency of micturition: Secondary | ICD-10-CM | POA: Insufficient documentation

## 2013-08-25 DIAGNOSIS — R0789 Other chest pain: Secondary | ICD-10-CM | POA: Insufficient documentation

## 2013-08-25 DIAGNOSIS — Z7982 Long term (current) use of aspirin: Secondary | ICD-10-CM | POA: Insufficient documentation

## 2013-08-25 DIAGNOSIS — N390 Urinary tract infection, site not specified: Secondary | ICD-10-CM

## 2013-08-25 DIAGNOSIS — E119 Type 2 diabetes mellitus without complications: Secondary | ICD-10-CM | POA: Insufficient documentation

## 2013-08-25 DIAGNOSIS — Z8673 Personal history of transient ischemic attack (TIA), and cerebral infarction without residual deficits: Secondary | ICD-10-CM | POA: Insufficient documentation

## 2013-08-25 DIAGNOSIS — I2510 Atherosclerotic heart disease of native coronary artery without angina pectoris: Secondary | ICD-10-CM | POA: Insufficient documentation

## 2013-08-25 DIAGNOSIS — I10 Essential (primary) hypertension: Secondary | ICD-10-CM | POA: Insufficient documentation

## 2013-08-25 DIAGNOSIS — G40919 Epilepsy, unspecified, intractable, without status epilepticus: Secondary | ICD-10-CM | POA: Insufficient documentation

## 2013-08-25 DIAGNOSIS — Z79899 Other long term (current) drug therapy: Secondary | ICD-10-CM | POA: Insufficient documentation

## 2013-08-25 DIAGNOSIS — R079 Chest pain, unspecified: Secondary | ICD-10-CM

## 2013-08-25 DIAGNOSIS — R109 Unspecified abdominal pain: Secondary | ICD-10-CM | POA: Insufficient documentation

## 2013-08-25 DIAGNOSIS — J4489 Other specified chronic obstructive pulmonary disease: Secondary | ICD-10-CM | POA: Insufficient documentation

## 2013-08-25 DIAGNOSIS — F17200 Nicotine dependence, unspecified, uncomplicated: Secondary | ICD-10-CM | POA: Insufficient documentation

## 2013-08-25 DIAGNOSIS — I252 Old myocardial infarction: Secondary | ICD-10-CM | POA: Insufficient documentation

## 2013-08-25 LAB — URINALYSIS, ROUTINE W REFLEX MICROSCOPIC
Bilirubin Urine: NEGATIVE
Glucose, UA: 100 mg/dL — AB
Protein, ur: 100 mg/dL — AB
Urobilinogen, UA: 0.2 mg/dL (ref 0.0–1.0)

## 2013-08-25 LAB — CBC WITH DIFFERENTIAL/PLATELET
Basophils Absolute: 0 10*3/uL (ref 0.0–0.1)
Eosinophils Relative: 2 % (ref 0–5)
Lymphocytes Relative: 16 % (ref 12–46)
Lymphs Abs: 1.2 10*3/uL (ref 0.7–4.0)
MCV: 80.2 fL (ref 78.0–100.0)
Neutro Abs: 5.7 10*3/uL (ref 1.7–7.7)
Platelets: 145 10*3/uL — ABNORMAL LOW (ref 150–400)
RBC: 5.51 MIL/uL (ref 4.22–5.81)
RDW: 13.2 % (ref 11.5–15.5)
WBC: 7.6 10*3/uL (ref 4.0–10.5)

## 2013-08-25 LAB — URINE MICROSCOPIC-ADD ON

## 2013-08-25 LAB — COMPREHENSIVE METABOLIC PANEL
ALT: 9 U/L (ref 0–53)
AST: 15 U/L (ref 0–37)
Alkaline Phosphatase: 69 U/L (ref 39–117)
CO2: 25 mEq/L (ref 19–32)
Calcium: 9.8 mg/dL (ref 8.4–10.5)
Chloride: 97 mEq/L (ref 96–112)
GFR calc Af Amer: 73 mL/min — ABNORMAL LOW (ref >90)
GFR calc non Af Amer: 63 mL/min — ABNORMAL LOW (ref >90)
Glucose, Bld: 196 mg/dL — ABNORMAL HIGH (ref 70–99)
Potassium: 4.2 mEq/L (ref 3.5–5.1)
Sodium: 133 mEq/L — ABNORMAL LOW (ref 135–145)
Total Bilirubin: 0.4 mg/dL (ref 0.3–1.2)

## 2013-08-25 LAB — TROPONIN I: Troponin I: <0.3 ng/mL (ref <0.30)

## 2013-08-25 MED ORDER — SODIUM CHLORIDE 0.9 % IV SOLN
Freq: Once | INTRAVENOUS | Status: AC
  Administered 2013-08-25: 13:00:00 via INTRAVENOUS

## 2013-08-25 MED ORDER — IOHEXOL 300 MG/ML  SOLN
100.0000 mL | Freq: Once | INTRAMUSCULAR | Status: AC | PRN
  Administered 2013-08-25: 100 mL via INTRAVENOUS

## 2013-08-25 MED ORDER — CEPHALEXIN 500 MG PO CAPS: 500.0000 mg | ORAL_CAPSULE | Freq: Four times a day (QID) | ORAL | Status: DC

## 2013-08-25 MED ORDER — DEXTROSE 5 % IV SOLN
1.0000 g | Freq: Once | INTRAVENOUS | Status: AC
  Administered 2013-08-25: 1 g via INTRAVENOUS
  Filled 2013-08-25: qty 10

## 2013-08-25 MED ORDER — MORPHINE SULFATE 4 MG/ML IJ SOLN
6.0000 mg | Freq: Once | INTRAMUSCULAR | Status: AC
  Administered 2013-08-25: 6 mg via INTRAVENOUS
  Filled 2013-08-25: qty 2

## 2013-08-25 MED ORDER — IOHEXOL 300 MG/ML  SOLN
50.0000 mL | Freq: Once | INTRAMUSCULAR | Status: AC | PRN
  Administered 2013-08-25: 50 mL via ORAL

## 2013-08-25 NOTE — ED Notes (Signed)
Per EMS- patient c/o abdominal pain and chest pain. Patient has a history of stomach cancer. Chest hurts worse with deep breath and palpation.

## 2013-08-25 NOTE — ED Notes (Signed)
Patient transported to X-ray 

## 2013-08-25 NOTE — ED Notes (Signed)
Spoke with pt's wife. States ride should be here soon

## 2013-08-25 NOTE — ED Notes (Signed)
Bladder scan  48 ml

## 2013-08-25 NOTE — ED Provider Notes (Signed)
CSN: [id number]     Arrival date & time 08/25/13  3086 History   First MD Initiated Contact with Patient 08/25/13 [phone]     Chief Complaint  Patient presents with  . Abdominal Pain  . Chest Pain    HPI Patient reports developing lower abdominal discomfort over the past 24-48 hours.  Urinary frequency without dysuria.  He reports his pain is worse with palpation of his lower abdomen.  He also reports intermittent chest pain over the past 24 hours.  He has history of cardiac disease.  He states his chest pain is worse with palpation.  No history of DVT or pulmonary embolism.  He denies significant shortness of breath at this time.  Patient had aspirin this morning without improvement in his symptoms.  His pain in his chest his been constant since he awoke this morning at 6:30.  He is a history of constipation and feels as though his been slightly more constipated over the past 24-48 hours.  He denies diarrhea.  No melena or hematochezia.  No nausea or vomiting.  No emesis.  Pain at this time is mild to moderate in severity.   Past Medical History  Diagnosis Date  . Myocardial infarction   . CVA (cerebral infarction)   . Diabetes mellitus   . Hypertension   . Diabetes mellitus   . Aphasia   . Dysphagia   . COPD (chronic obstructive pulmonary disease)   . Shortness of breath   . Anemia   . Stroke   . Colon cancer   . Hemorrhoid   . Intractable seizures   . Pseudobulbar affect   . Dyslipidemia   . CAD (coronary artery disease)   . History of hemorrhoids    Past Surgical History  Procedure Laterality Date  . Coronary artery bypass graft    . Appendectomy    . Dental surgery    . Colonoscopy  03/13/2012    Procedure: COLONOSCOPY;  Surgeon: Hart Carwin, MD;  Location: [facility];  Service: Endoscopy;  Laterality: N/A;  . Partial colectomy  03/22/2012    Procedure: PARTIAL COLECTOMY;  Surgeon: Cherylynn Ridges, MD;  Location: [facility] OR;  Service: General;  Laterality: Right;  . Coronary  artery bypass graft      x3  . Coronary stent placement     Family History  Problem Relation Age of Onset  . Breast cancer Mother   . Heart disease Brother   . Heart attack Brother   . Colon cancer Maternal Uncle   . Heart attack Father   . Heart attack Sister    History  Substance Use Topics  . Smoking status: Current Some Day Smoker -- 1.00 packs/day for 16 years    Types: Cigarettes  . Smokeless tobacco: Never Used  . Alcohol Use: No    Review of Systems  All other systems reviewed and are negative.    Allergies  Review of patient's allergies indicates no known allergies.  Home Medications   Current Outpatient Rx  Name  Route  Sig  Dispense  Refill  . albuterol (PROVENTIL HFA;VENTOLIN HFA) 108 (90 BASE) MCG/ACT inhaler   Inhalation   Inhale 1-2 puffs into the lungs every 6 (six) hours as needed for wheezing.   1 Inhaler   0   . aspirin EC 81 MG tablet   Oral   Take 81 mg by mouth daily. For CVA prophylaxis.         Marland Kitchen clopidogrel (PLAVIX) 75 MG  tablet   Oral   Take 75 mg by mouth daily.         Marland Kitchen escitalopram (LEXAPRO) 10 MG tablet   Oral   Take 10 mg by mouth daily.         . finasteride (PROSCAR) 5 MG tablet   Oral   Take 5 mg by mouth daily.         Marland Kitchen HYDROcodone-acetaminophen (NORCO/VICODIN) 5-325 MG per tablet   Oral   Take 1 tablet by mouth every 6 (six) hours as needed. For pain         . levETIRAcetam (KEPPRA) 500 MG tablet   Oral   Take 1 tablet (500 mg total) by mouth every 12 (twelve) hours.   60 tablet   1   . lubiprostone (AMITIZA) 8 MCG capsule   Oral   Take 1 capsule (8 mcg total) by mouth 2 (two) times daily with a meal.   60 capsule   6   . Multiple Vitamin (MULITIVITAMIN WITH MINERALS) TABS   Oral   Take 1 tablet by mouth daily.         . nitrofurantoin, macrocrystal-monohydrate, (MACROBID) 100 MG capsule   Oral   Take 100 mg by mouth 2 (two) times daily.         . nitroGLYCERIN (NITROSTAT) 0.4 MG SL  tablet   Sublingual   Place 0.4 mg under the tongue every 5 (five) minutes x 3 doses as needed. For chest pain         . oxazepam (SERAX) 30 MG capsule   Oral   Take 30 mg by mouth at bedtime.         . polyethylene glycol (MIRALAX) packet   Oral   Take 17 g by mouth 2 (two) times daily.   30 each   0   . promethazine (PHENERGAN) 25 MG tablet   Oral   Take 25 mg by mouth every 6 (six) hours as needed. For nausea.         . sitaGLIPtan-metformin (JANUMET) 50-1000 MG per tablet   Oral   Take 1 tablet by mouth 2 (two) times daily with a meal.         . topiramate (TOPAMAX) 25 MG tablet   Oral   Take 1 tablet (25 mg total) by mouth 2 (two) times daily.   60 tablet   3   . valsartan (DIOVAN) 160 MG tablet   Oral   Take 160 mg by mouth daily.         . vitamin C (ASCORBIC ACID) 500 MG tablet   Oral   Take 500 mg by mouth 2 (two) times daily.         Marland Kitchen zinc sulfate 220 MG capsule   Oral   Take 220 mg by mouth daily.         . cephALEXin (KEFLEX) 500 MG capsule   Oral   Take 1 capsule (500 mg total) by mouth 4 (four) times daily.   28 capsule   0    BP 152/66  Pulse 75  Temp(Src) 98.3 F (36.8 C) (Oral)  Resp 20  SpO2 98% Physical Exam  Nursing note and vitals reviewed. Constitutional: He is oriented to person, place, and time. He appears well-developed and well-nourished.  HENT:  Head: Normocephalic and atraumatic.  Eyes: EOM are normal.  Neck: Normal range of motion.  Cardiovascular: Normal rate, regular rhythm, normal heart sounds and intact distal pulses.  Pulmonary/Chest: Effort normal and breath sounds normal. No respiratory distress.  Abdominal: Soft. He exhibits no distension. There is no tenderness.  Musculoskeletal: Normal range of motion.  Neurological: He is alert and oriented to person, place, and time.  Skin: Skin is warm and dry.  Psychiatric: He has a normal mood and affect. Judgment normal.    ED Course  Procedures  (including critical care time) Labs Review Labs Reviewed  CBC WITH DIFFERENTIAL - Abnormal; Notable for the following:    Platelets 145 (*)    All other components within normal limits  COMPREHENSIVE METABOLIC PANEL - Abnormal; Notable for the following:    Sodium 133 (*)    Glucose, Bld 196 (*)    Albumin 3.4 (*)    GFR calc non Af Amer 63 (*)    GFR calc Af Amer 73 (*)    All other components within normal limits  URINALYSIS, ROUTINE W REFLEX MICROSCOPIC - Abnormal; Notable for the following:    Glucose, UA 100 (*)    Hgb urine dipstick TRACE (*)    Protein, ur 100 (*)    Leukocytes, UA SMALL (*)    All other components within normal limits  LIPASE, BLOOD  TROPONIN I  URINE MICROSCOPIC-ADD ON  TROPONIN I   Imaging Review Dg Chest 2 View  08/25/2013   CLINICAL DATA:  Chest and abdominal pain for 2 weeks.  EXAM: CHEST  2 VIEW  COMPARISON:  Single view of the chest 10/25/2012.  FINDINGS: The patient is status post CABG. The lungs are clear. Heart size is normal. No pneumothorax or pleural effusion.  IMPRESSION: No acute disease.   Electronically Signed   By: Drusilla Kanner M.D.   On: 08/25/2013 10:11   Ct Abdomen Pelvis W Contrast  08/25/2013   CLINICAL DATA:  Left lower quadrant pain, history of colon cancer with partial colectomy, status post appendectomy  EXAM: CT ABDOMEN AND PELVIS WITH CONTRAST  TECHNIQUE: Multidetector CT imaging of the abdomen and pelvis was performed using the standard protocol following bolus administration of intravenous contrast.  CONTRAST:  OMNIPAQUE IOHEXOL 300 MG/ML  SOLN  COMPARISON:  10/12/2012  FINDINGS: Lung bases are unremarkable. Sagittal images of the spine shows degenerative changes lumbar spine. No destructive bony lesions are noted.  Mild hepatic fatty infiltration. No focal hepatic mass. Small calcified gallstone within gallbladder measures 3 mm. The pancreas, spleen and adrenal glands are unremarkable. Kidneys are symmetrical in size and  enhancement. No hydronephrosis or hydroureter. Multiple bilateral renal cysts. Largest cyst in upper pole of the right kidney measures 2.8 cm. Largest cyst is midpole of the left kidney measures 2 cm. There is nonobstructive calcified calculus in right renal pelvis measures 1.2 cm. Atherosclerotic calcifications of abdominal aorta and iliac arteries are noted.  The patient is status post right hemicolectomy. Moderate stool noted in transverse colon. No evidence of colitis or diverticulitis.  No small bowel obstruction. No ascites or free air. No adenopathy. No aortic aneurysm.  There is enlarged prostate gland with indentation of urinary bladder base. Prostate gland measures 5 x 5.6 cm. There is thickening of urinary bladder wall. Small amount of air noted anterior aspect of urinary bladder. This may be due to resistance mentation or cystitis. Clinical correlation is necessary. Seminal vesicle calcifications are noted.  Delayed renal images shows bilateral renal symmetrical excretion. Bilateral visualized proximal ureter is unremarkable.  IMPRESSION: 1. Fatty infiltration of the liver. Small calcified gallstone within gallbladder measures 3 mm. 2. Bilateral multiple  renal cysts. Nonobstructive calcified calculus right renal pelvis measures 1.2 cm. 3. Status post partial right hemicolectomy. Moderate stool noted in transverse colon. No colitis or diverticulitis. 4. There is thickening of urinary bladder wall. Small amount of air within anterior aspect of the bladder may be post instrumentation or due to cystitis. Clinical correlation is necessary. 5. Enlarged prostate gland with indentation of urinary bladder base. Correlation with urology exam is recommended. 6. Degenerative changes lumbar spine.   Electronically Signed   By: Natasha Mead M.D.   On: 08/25/2013 11:38  I personally reviewed the imaging tests through PACS system I reviewed available ER/hospitalization records through the EMR   EKG Interpretation     Date/Time:  Monday August 25 2013 09:36:45 EST Ventricular Rate:  71 PR Interval:  134 QRS Duration: 94 QT Interval:  402 QTC Calculation: 436 R Axis:   3 Text Interpretation:  Normal sinus rhythm Possible Left atrial enlargement Possible Inferior infarct , age undetermined Abnormal ECG No significant change was found Confirmed by Kaleeya Hancock  MD, Mackynzie Woolford ([id number]) on 08/25/2013 9:42:22 AM            MDM   1. Abdominal pain   2. Chest pain   3. Urinary tract infection    2:42 PM Patient feels much better this time.  He's tolerating oral fluids and food.  My suspicion for ACS is low.  His EKG is without significant changes.  Troponin x2 is negative despite constant chest pain.  His chest pain seems to be improved at this time.  His lower abdominal pain is more likely related to urinary tract infection.  Urine culture sent.  Patient given a dose of Rocephin in the emergency department.  Discharge home on Keflex.  He understands return to the ER for new or worsening symptoms.  PCP followup.    Lyanne Co, MD 08/25/13 [phone]

## 2013-08-25 NOTE — ED Notes (Signed)
Patient reports that he took Aspirin 81 mg x 6 tablets this AM at 0630 this AM.

## 2013-08-27 ENCOUNTER — Other Ambulatory Visit (HOSPITAL_COMMUNITY): Payer: Self-pay | Admitting: Cardiology

## 2013-08-27 DIAGNOSIS — R079 Chest pain, unspecified: Secondary | ICD-10-CM

## 2013-09-03 ENCOUNTER — Encounter (HOSPITAL_COMMUNITY)
Admission: RE | Admit: 2013-09-03 | Discharge: 2013-09-03 | Disposition: A | Discharge status: Home / Self Care | Payer: Medicare Other | Source: Ambulatory Visit | Attending: Cardiology | Admitting: Cardiology

## 2013-09-03 ENCOUNTER — Ambulatory Visit (HOSPITAL_COMMUNITY)
Admission: RE | Admit: 2013-09-03 | Discharge: 2013-09-03 | Disposition: A | Discharge status: Home / Self Care | Payer: Medicare Other | Source: Ambulatory Visit | Attending: Cardiology | Admitting: Cardiology

## 2013-09-03 ENCOUNTER — Other Ambulatory Visit: Payer: Self-pay

## 2013-09-03 DIAGNOSIS — I10 Essential (primary) hypertension: Secondary | ICD-10-CM | POA: Insufficient documentation

## 2013-09-03 DIAGNOSIS — I2510 Atherosclerotic heart disease of native coronary artery without angina pectoris: Secondary | ICD-10-CM | POA: Insufficient documentation

## 2013-09-03 DIAGNOSIS — I252 Old myocardial infarction: Secondary | ICD-10-CM | POA: Insufficient documentation

## 2013-09-03 DIAGNOSIS — R079 Chest pain, unspecified: Secondary | ICD-10-CM

## 2013-09-03 MED ORDER — REGADENOSON 0.4 MG/5ML IV SOLN
0.4000 mg | Freq: Once | INTRAVENOUS | Status: AC
  Administered 2013-09-03: 0.4 mg via INTRAVENOUS

## 2013-09-03 MED ORDER — REGADENOSON 0.4 MG/5ML IV SOLN
INTRAVENOUS | Status: AC
  Administered 2013-09-03: 0.4 mg via INTRAVENOUS
  Filled 2013-09-03: qty 5

## 2013-09-03 MED ORDER — TECHNETIUM TC 99M SESTAMIBI GENERIC - CARDIOLITE
30.0000 | Freq: Once | INTRAVENOUS | Status: AC | PRN
  Administered 2013-09-03: 30 via INTRAVENOUS

## 2013-09-03 MED ORDER — TECHNETIUM TC 99M SESTAMIBI GENERIC - CARDIOLITE
10.0000 | Freq: Once | INTRAVENOUS | Status: AC | PRN
  Administered 2013-09-03: 10 via INTRAVENOUS

## 2013-09-06 ENCOUNTER — Emergency Department (HOSPITAL_COMMUNITY)
Admission: EM | Admit: 2013-09-06 | Discharge: 2013-09-06 | Disposition: A | Discharge status: Home / Self Care | Payer: Medicare Other | Source: Home / Self Care | Attending: Family Medicine | Admitting: Family Medicine

## 2013-09-06 ENCOUNTER — Emergency Department (INDEPENDENT_AMBULATORY_CARE_PROVIDER_SITE_OTHER): Payer: Medicare Other

## 2013-09-06 ENCOUNTER — Encounter (HOSPITAL_COMMUNITY): Payer: Self-pay | Admitting: Emergency Medicine

## 2013-09-06 DIAGNOSIS — K5900 Constipation, unspecified: Secondary | ICD-10-CM

## 2013-09-06 LAB — POCT URINALYSIS DIP (DEVICE)
Bilirubin Urine: NEGATIVE
Glucose, UA: 500 mg/dL — AB
Ketones, ur: NEGATIVE mg/dL
Nitrite: NEGATIVE
Urobilinogen, UA: 0.2 mg/dL (ref 0.0–1.0)
pH: 5.5 (ref 5.0–8.0)

## 2013-09-06 LAB — GLUCOSE, CAPILLARY: Glucose-Capillary: 251 mg/dL — ABNORMAL HIGH (ref 70–99)

## 2013-09-06 MED ORDER — POLYETHYLENE GLYCOL 3350 17 G PO PACK: 17.0000 g | PACK | Freq: Every day | ORAL | Status: DC

## 2013-09-06 NOTE — ED Notes (Signed)
Waiting for daughter to come and p/u

## 2013-09-06 NOTE — ED Notes (Signed)
Pt is accompanied by wife and daughter... C/o abd pain and right flank pain that is getting worse. He has been seen by [facility] ED for similars sxs Also c/o dysuria, BA and bilateral leg pain Denies: f/v/n/d, hematuria  Pt brought back in wheelchair... Alert w/no signs of acute distress.

## 2013-09-06 NOTE — ED Provider Notes (Signed)
CSN: [id number]     Arrival date & time 09/06/13  1039 History   First MD Initiated Contact with Patient 09/06/13 1215     Chief Complaint  Patient presents with  . Abdominal Pain   (Consider location/radiation/quality/duration/timing/severity/associated sxs/prior Treatment) HPI Comments: Patient is nearly non-verbal secondary to multiple CVAs in the past. He is only able to respond to questions with what appears as if he is beginning to cry. This, too, has been the case since most recent CVA. Both wife and daughter are also limited historians. Daughter states patient has had >1 year of right sided abdominal pain and is unable to elaborate on why or what her father was able to indicate that was different to prompt today's visit. Family reports patient was recently seen at [facility] ER for abdominal pain and was diagnosed with UTI and is currently being treated with cephalexin. Patient is chronically incontinent of urine and stool. Family denies fever, cough, vomiting or diarrhea. No reports of blood in urine or stool. They do report that he has not had bowel movement since 09/01/2013. No report of hematuria. ER records from 08/25/2013 reviewed and CT A/P was without acute finding. Family does report that patient has been able to communicate that he sometimes has discomfort at the end of his penis with urination. Unable to determine if anything exacerbates or relieves abdominal pain.   Patient is a 72 y.o. male presenting with abdominal pain. The history is provided by the patient, the spouse and a relative.  Abdominal Pain Pain location:  RUQ   Past Medical History  Diagnosis Date  . Myocardial infarction   . CVA (cerebral infarction)   . Diabetes mellitus   . Hypertension   . Diabetes mellitus   . Aphasia   . Dysphagia   . COPD (chronic obstructive pulmonary disease)   . Shortness of breath   . Anemia   . Stroke   . Colon cancer   . Hemorrhoid   . Intractable seizures   .  Pseudobulbar affect   . Dyslipidemia   . CAD (coronary artery disease)   . History of hemorrhoids    Past Surgical History  Procedure Laterality Date  . Coronary artery bypass graft    . Appendectomy    . Dental surgery    . Colonoscopy  03/13/2012    Procedure: COLONOSCOPY;  Surgeon: Hart Carwin, MD;  Location: [facility];  Service: Endoscopy;  Laterality: N/A;  . Partial colectomy  03/22/2012    Procedure: PARTIAL COLECTOMY;  Surgeon: Cherylynn Ridges, MD;  Location: [facility] OR;  Service: General;  Laterality: Right;  . Coronary artery bypass graft      x3  . Coronary stent placement     Family History  Problem Relation Age of Onset  . Breast cancer Mother   . Heart disease Brother   . Heart attack Brother   . Colon cancer Maternal Uncle   . Heart attack Father   . Heart attack Sister    History  Substance Use Topics  . Smoking status: Current Some Day Smoker -- 1.00 packs/day for 16 years    Types: Cigarettes  . Smokeless tobacco: Never Used  . Alcohol Use: No    Review of Systems  Unable to perform ROS: Patient nonverbal  Gastrointestinal: Positive for abdominal pain.    Allergies  Review of patient's allergies indicates no known allergies.  Home Medications   Current Outpatient Rx  Name  Route  Sig  Dispense  Refill  . albuterol (PROVENTIL HFA;VENTOLIN HFA) 108 (90 BASE) MCG/ACT inhaler   Inhalation   Inhale 1-2 puffs into the lungs every 6 (six) hours as needed for wheezing.   1 Inhaler   0   . aspirin EC 81 MG tablet   Oral   Take 81 mg by mouth daily. For CVA prophylaxis.         . cephALEXin (KEFLEX) 500 MG capsule   Oral   Take 1 capsule (500 mg total) by mouth 4 (four) times daily.   28 capsule   0   . clopidogrel (PLAVIX) 75 MG tablet   Oral   Take 75 mg by mouth daily.         Marland Kitchen escitalopram (LEXAPRO) 10 MG tablet   Oral   Take 10 mg by mouth daily.         . finasteride (PROSCAR) 5 MG tablet   Oral   Take 5 mg by mouth daily.          Marland Kitchen HYDROcodone-acetaminophen (NORCO/VICODIN) 5-325 MG per tablet   Oral   Take 1 tablet by mouth every 6 (six) hours as needed. For pain         . levETIRAcetam (KEPPRA) 500 MG tablet   Oral   Take 1 tablet (500 mg total) by mouth every 12 (twelve) hours.   60 tablet   1   . lubiprostone (AMITIZA) 8 MCG capsule   Oral   Take 1 capsule (8 mcg total) by mouth 2 (two) times daily with a meal.   60 capsule   6   . Multiple Vitamin (MULITIVITAMIN WITH MINERALS) TABS   Oral   Take 1 tablet by mouth daily.         . nitrofurantoin, macrocrystal-monohydrate, (MACROBID) 100 MG capsule   Oral   Take 100 mg by mouth 2 (two) times daily.         . nitroGLYCERIN (NITROSTAT) 0.4 MG SL tablet   Sublingual   Place 0.4 mg under the tongue every 5 (five) minutes x 3 doses as needed. For chest pain         . oxazepam (SERAX) 30 MG capsule   Oral   Take 30 mg by mouth at bedtime.         . polyethylene glycol (MIRALAX) packet   Oral   Take 17 g by mouth 2 (two) times daily.   30 each   0   . polyethylene glycol (MIRALAX) packet   Oral   Take 17 g by mouth daily. Mix into 8 oz of water and drink once a day for 7 days.   14 each   0   . promethazine (PHENERGAN) 25 MG tablet   Oral   Take 25 mg by mouth every 6 (six) hours as needed. For nausea.         . sitaGLIPtan-metformin (JANUMET) 50-1000 MG per tablet   Oral   Take 1 tablet by mouth 2 (two) times daily with a meal.         . topiramate (TOPAMAX) 25 MG tablet   Oral   Take 1 tablet (25 mg total) by mouth 2 (two) times daily.   60 tablet   3   . valsartan (DIOVAN) 160 MG tablet   Oral   Take 160 mg by mouth daily.         . vitamin C (ASCORBIC ACID) 500 MG tablet   Oral   Take 500 mg by  mouth 2 (two) times daily.         Marland Kitchen zinc sulfate 220 MG capsule   Oral   Take 220 mg by mouth daily.          BP 178/87  Pulse 78  Temp(Src) 98.8 F (37.1 C) (Oral)  Resp 18  SpO2 98% Physical Exam   Constitutional: He appears well-developed and well-nourished. No distress.  HENT:  Head: Normocephalic and atraumatic.  Mouth/Throat: Oropharynx is clear and moist. No oropharyngeal exudate.  Eyes: Conjunctivae are normal. No scleral icterus.  Neck: Normal range of motion. Neck supple.  Cardiovascular: Normal rate, regular rhythm and normal heart sounds.   Pulmonary/Chest: Effort normal and breath sounds normal.  Abdominal: Soft. Bowel sounds are normal. He exhibits no distension and no mass. There is no rebound and no guarding.  Patient cannot localize any area of tenderness. Appears to respond with discomfort to palpation of any area on abdomen, groin, flank areas and genitalia. No visible findings of concern. Well healed midline scar. No hernias, testicular swelling, rash, distension, or organomegaly. Abdomen is soft and flat. Normal uncircumcised penis with mobile foreskin.  Genitourinary: Rectum normal and penis normal.  Soft light brown stool, no palpable fecal impaction.  Musculoskeletal: Normal range of motion. He exhibits no edema and no tenderness.  Lymphadenopathy:    He has no cervical adenopathy.  Neurological: He is alert.  Skin: Skin is warm and dry. No rash noted.    ED Course  Procedures (including critical care time) Labs Review Labs Reviewed  GLUCOSE, CAPILLARY - Abnormal; Notable for the following:    Glucose-Capillary 251 (*)    All other components within normal limits  POCT URINALYSIS DIP (DEVICE) - Abnormal; Notable for the following:    Glucose, UA 500 (*)    Hgb urine dipstick MODERATE (*)    Protein, ur >=300 (*)    Leukocytes, UA TRACE (*)    All other components within normal limits  URINE CULTURE   Imaging Review Dg Abd 2 Views  09/06/2013   CLINICAL DATA:  Abdominal pain and constipation.  EXAM: ABDOMEN - 2 VIEW  COMPARISON:  Abdominal and pelvic CT scan of August 25, 2013.  FINDINGS: The bowel gas pattern is within the limits of normal. I cannot  exclude constipation. A dense rounded calcification is consistent with a known 1.2 cm stone in the right renal pelvis. There are phleboliths within the pelvis. There seminal vesicle calcification is well in the pelvis. Vascular arterial calcifications also are demonstrated. There is surgical suture material in the right mid abdomen. The bony structures exhibit no acute abnormalities.  IMPRESSION: 1. The bowel gas pattern may indicate constipation. There is no evidence of ileus or obstruction. 2. There is a 1.2 cm diameter stone on the right which likely corresponds to the known stone in the right renal pelvis. The known gallstones are not clearly evident. Sign rib   Electronically Signed   By: David  [place]   On: 09/06/2013 13:13    EKG Interpretation    Date/Time:    Ventricular Rate:    PR Interval:    QRS Duration:   QT Interval:    QTC Calculation:   R Axis:     Text Interpretation:              MDM  Case discussed with and patient examined by Dr. Denyse Amass. Case is quite challenging as patient is non-verbal and cognitively impaired as a result of multiple cerebral insults and  with multiple chronic medical issues. family has very limited medical understanding. Exam is very reassuring and without suggestion of acute abdominal process. Will instruct family to complete his course of cephalexin and use miralax as instructed to try to eliminate some of his stool burden. The family does understand that they are welcome to bring patient for a re-evaluation tomorrow here at [facility]. They also understand if they feel symptoms are becoming suddenly worse or severe, they are to call 911 and have patient evaluated at nearest ER.     Jess Barters [place], [place] 09/06/13 [phone]

## 2013-09-07 ENCOUNTER — Inpatient Hospital Stay (HOSPITAL_COMMUNITY)
Admission: EM | Admit: 2013-09-07 | Discharge: 2013-09-12 | DRG: 690 | Disposition: A | Discharge status: Home Health | Payer: Medicare Other | Attending: Family Medicine | Admitting: Family Medicine

## 2013-09-07 ENCOUNTER — Emergency Department (HOSPITAL_COMMUNITY): Payer: Medicare Other

## 2013-09-07 ENCOUNTER — Other Ambulatory Visit: Payer: Self-pay

## 2013-09-07 ENCOUNTER — Encounter (HOSPITAL_COMMUNITY): Payer: Self-pay | Admitting: Emergency Medicine

## 2013-09-07 DIAGNOSIS — R599 Enlarged lymph nodes, unspecified: Secondary | ICD-10-CM

## 2013-09-07 DIAGNOSIS — G40909 Epilepsy, unspecified, not intractable, without status epilepticus: Secondary | ICD-10-CM | POA: Diagnosis present

## 2013-09-07 DIAGNOSIS — K5900 Constipation, unspecified: Secondary | ICD-10-CM

## 2013-09-07 DIAGNOSIS — E785 Hyperlipidemia, unspecified: Secondary | ICD-10-CM | POA: Diagnosis present

## 2013-09-07 DIAGNOSIS — Z7902 Long term (current) use of antithrombotics/antiplatelets: Secondary | ICD-10-CM

## 2013-09-07 DIAGNOSIS — Z8673 Personal history of transient ischemic attack (TIA), and cerebral infarction without residual deficits: Secondary | ICD-10-CM

## 2013-09-07 DIAGNOSIS — E861 Hypovolemia: Secondary | ICD-10-CM

## 2013-09-07 DIAGNOSIS — D3500 Benign neoplasm of unspecified adrenal gland: Secondary | ICD-10-CM | POA: Diagnosis present

## 2013-09-07 DIAGNOSIS — IMO0002 Reserved for concepts with insufficient information to code with codable children: Secondary | ICD-10-CM | POA: Diagnosis present

## 2013-09-07 DIAGNOSIS — I639 Cerebral infarction, unspecified: Secondary | ICD-10-CM | POA: Diagnosis present

## 2013-09-07 DIAGNOSIS — R933 Abnormal findings on diagnostic imaging of other parts of digestive tract: Secondary | ICD-10-CM

## 2013-09-07 DIAGNOSIS — E860 Dehydration: Secondary | ICD-10-CM

## 2013-09-07 DIAGNOSIS — Z85038 Personal history of other malignant neoplasm of large intestine: Secondary | ICD-10-CM

## 2013-09-07 DIAGNOSIS — N390 Urinary tract infection, site not specified: Principal | ICD-10-CM | POA: Diagnosis present

## 2013-09-07 DIAGNOSIS — R5381 Other malaise: Secondary | ICD-10-CM | POA: Diagnosis present

## 2013-09-07 DIAGNOSIS — Z79899 Other long term (current) drug therapy: Secondary | ICD-10-CM

## 2013-09-07 DIAGNOSIS — R195 Other fecal abnormalities: Secondary | ICD-10-CM

## 2013-09-07 DIAGNOSIS — R4701 Aphasia: Secondary | ICD-10-CM | POA: Diagnosis present

## 2013-09-07 DIAGNOSIS — J4489 Other specified chronic obstructive pulmonary disease: Secondary | ICD-10-CM | POA: Diagnosis present

## 2013-09-07 DIAGNOSIS — D649 Anemia, unspecified: Secondary | ICD-10-CM

## 2013-09-07 DIAGNOSIS — R4182 Altered mental status, unspecified: Secondary | ICD-10-CM

## 2013-09-07 DIAGNOSIS — E119 Type 2 diabetes mellitus without complications: Secondary | ICD-10-CM

## 2013-09-07 DIAGNOSIS — J449 Chronic obstructive pulmonary disease, unspecified: Secondary | ICD-10-CM | POA: Diagnosis present

## 2013-09-07 DIAGNOSIS — Z951 Presence of aortocoronary bypass graft: Secondary | ICD-10-CM

## 2013-09-07 DIAGNOSIS — I69920 Aphasia following unspecified cerebrovascular disease: Secondary | ICD-10-CM

## 2013-09-07 DIAGNOSIS — D490 Neoplasm of unspecified behavior of digestive system: Secondary | ICD-10-CM

## 2013-09-07 DIAGNOSIS — I2510 Atherosclerotic heart disease of native coronary artery without angina pectoris: Secondary | ICD-10-CM

## 2013-09-07 DIAGNOSIS — R269 Unspecified abnormalities of gait and mobility: Secondary | ICD-10-CM

## 2013-09-07 DIAGNOSIS — I679 Cerebrovascular disease, unspecified: Secondary | ICD-10-CM

## 2013-09-07 DIAGNOSIS — N179 Acute kidney failure, unspecified: Secondary | ICD-10-CM

## 2013-09-07 DIAGNOSIS — F17200 Nicotine dependence, unspecified, uncomplicated: Secondary | ICD-10-CM | POA: Diagnosis present

## 2013-09-07 DIAGNOSIS — D126 Benign neoplasm of colon, unspecified: Secondary | ICD-10-CM

## 2013-09-07 DIAGNOSIS — E1165 Type 2 diabetes mellitus with hyperglycemia: Secondary | ICD-10-CM | POA: Diagnosis present

## 2013-09-07 DIAGNOSIS — IMO0001 Reserved for inherently not codable concepts without codable children: Secondary | ICD-10-CM | POA: Diagnosis present

## 2013-09-07 DIAGNOSIS — R569 Unspecified convulsions: Secondary | ICD-10-CM

## 2013-09-07 DIAGNOSIS — R0789 Other chest pain: Secondary | ICD-10-CM

## 2013-09-07 DIAGNOSIS — G40319 Generalized idiopathic epilepsy and epileptic syndromes, intractable, without status epilepticus: Secondary | ICD-10-CM

## 2013-09-07 DIAGNOSIS — E875 Hyperkalemia: Secondary | ICD-10-CM

## 2013-09-07 DIAGNOSIS — Z7982 Long term (current) use of aspirin: Secondary | ICD-10-CM

## 2013-09-07 DIAGNOSIS — R109 Unspecified abdominal pain: Secondary | ICD-10-CM

## 2013-09-07 DIAGNOSIS — Z9861 Coronary angioplasty status: Secondary | ICD-10-CM

## 2013-09-07 DIAGNOSIS — I252 Old myocardial infarction: Secondary | ICD-10-CM

## 2013-09-07 DIAGNOSIS — I10 Essential (primary) hypertension: Secondary | ICD-10-CM | POA: Diagnosis present

## 2013-09-07 LAB — POCT I-STAT TROPONIN I: Troponin i, poc: 0 ng/mL (ref 0.00–0.08)

## 2013-09-07 LAB — COMPREHENSIVE METABOLIC PANEL
ALT: 10 U/L (ref 0–53)
AST: 11 U/L (ref 0–37)
Albumin: 3.1 g/dL — ABNORMAL LOW (ref 3.5–5.2)
Alkaline Phosphatase: 81 U/L (ref 39–117)
BUN: 19 mg/dL (ref 6–23)
CO2: 29 mEq/L (ref 19–32)
Calcium: 9.1 mg/dL (ref 8.4–10.5)
Chloride: 97 mEq/L (ref 96–112)
Creatinine, Ser: 1.25 mg/dL (ref 0.50–1.35)
GFR calc non Af Amer: 56 mL/min — ABNORMAL LOW (ref >90)
Potassium: 4 mEq/L (ref 3.5–5.1)
Sodium: 137 mEq/L (ref 135–145)
Total Bilirubin: 0.2 mg/dL — ABNORMAL LOW (ref 0.3–1.2)
Total Protein: 6.5 g/dL (ref 6.0–8.3)

## 2013-09-07 LAB — CBC WITH DIFFERENTIAL/PLATELET
Basophils Absolute: 0 10*3/uL (ref 0.0–0.1)
Basophils Relative: 0 % (ref 0–1)
Eosinophils Absolute: 0.2 10*3/uL (ref 0.0–0.7)
HCT: 41.7 % (ref 39.0–52.0)
Hemoglobin: 14.6 g/dL (ref 13.0–17.0)
Lymphocytes Relative: 26 % (ref 12–46)
MCH: 28.3 pg (ref 26.0–34.0)
MCHC: 35 g/dL (ref 30.0–36.0)
Monocytes Absolute: 0.5 10*3/uL (ref 0.1–1.0)
Monocytes Relative: 7 % (ref 3–12)
Neutro Abs: 4.6 10*3/uL (ref 1.7–7.7)
RDW: 13.1 % (ref 11.5–15.5)

## 2013-09-07 LAB — URINALYSIS, ROUTINE W REFLEX MICROSCOPIC
Glucose, UA: 500 mg/dL — AB
Ketones, ur: NEGATIVE mg/dL
Nitrite: NEGATIVE
Protein, ur: >300 mg/dL — AB
Specific Gravity, Urine: 1.019 (ref 1.005–1.030)

## 2013-09-07 LAB — URINE MICROSCOPIC-ADD ON

## 2013-09-07 LAB — URINE CULTURE: Colony Count: 60000

## 2013-09-07 LAB — LIPASE, BLOOD: Lipase: 26 U/L (ref 11–59)

## 2013-09-07 LAB — CG4 I-STAT (LACTIC ACID): Lactic Acid, Venous: 2.31 mmol/L — ABNORMAL HIGH (ref 0.5–2.2)

## 2013-09-07 MED ORDER — MORPHINE SULFATE 2 MG/ML IJ SOLN
2.0000 mg | Freq: Once | INTRAMUSCULAR | Status: AC
  Administered 2013-09-07: 2 mg via INTRAVENOUS
  Filled 2013-09-07: qty 1

## 2013-09-07 MED ORDER — DEXTROSE 5 % IV SOLN
1.0000 g | Freq: Once | INTRAVENOUS | Status: AC
  Administered 2013-09-07: 1 g via INTRAVENOUS
  Filled 2013-09-07: qty 10

## 2013-09-07 MED ORDER — SODIUM CHLORIDE 0.9 % IV BOLUS (SEPSIS)
1000.0000 mL | Freq: Once | INTRAVENOUS | Status: AC
  Administered 2013-09-07: 1000 mL via INTRAVENOUS

## 2013-09-07 MED ORDER — IOHEXOL 300 MG/ML  SOLN
20.0000 mL | INTRAMUSCULAR | Status: AC
  Administered 2013-09-07: 25 mL via ORAL

## 2013-09-07 MED ORDER — IOHEXOL 300 MG/ML  SOLN
100.0000 mL | Freq: Once | INTRAMUSCULAR | Status: AC | PRN
  Administered 2013-09-07: 100 mL via INTRAVENOUS

## 2013-09-07 NOTE — H&P (Signed)
[facility] History and Physical  Patient: [name]  WUJ:[id number]  DOB: June 24, 1941  DOS: the patient was seen and examined on 09/07/2013 PCP: Delorse Lek, MD  Chief Complaint: Hurting all over  HPI: [name] is a 72 y.o. male with Past medical history of CVA, expressive aphasia office CVA, COPD, hypertension, CAD, dyslipidemia. The patient is coming from home. The patient is having expressive aphasia at his baseline due to his previous CVA therefore part of the history was obtained from documentation. Patient's wife was not available at the time of my evaluation. Patient presented with the complaints of hurting all over with primary focus on lower abdomen area and left side of the chest. Patient presented to ED with similar complaints yesterday at which time he was started on Keflex for possible UTI and MiraLax constipation. There was no complaint of nausea vomiting or diarrhea or blood in the urine. Family felt that the patient was warm. The patient initially mention about hurting on his chest on the left side but on repeated asking patient denies any pain in his chest. He does complain of pain in his leg bilaterally involving his ankle although the nature or the intensity is not able to specify.  Review of Systems: as mentioned in the history of present illness.  A Comprehensive review of the other systems is negative.  Past Medical History  Diagnosis Date  . Myocardial infarction   . CVA (cerebral infarction)   . Diabetes mellitus   . Hypertension   . Diabetes mellitus   . Aphasia   . Dysphagia   . COPD (chronic obstructive pulmonary disease)   . Shortness of breath   . Anemia   . Stroke   . Colon cancer   . Hemorrhoid   . Intractable seizures   . Pseudobulbar affect   . Dyslipidemia   . CAD (coronary artery disease)   . History of hemorrhoids    Past Surgical History  Procedure Laterality Date  . Coronary artery bypass graft    .  Appendectomy    . Dental surgery    . Colonoscopy  03/13/2012    Procedure: COLONOSCOPY;  Surgeon: Hart Carwin, MD;  Location: [facility];  Service: Endoscopy;  Laterality: N/A;  . Partial colectomy  03/22/2012    Procedure: PARTIAL COLECTOMY;  Surgeon: Cherylynn Ridges, MD;  Location: [facility] OR;  Service: General;  Laterality: Right;  . Coronary artery bypass graft      x3  . Coronary stent placement     Social History:  reports that he has been smoking Cigarettes.  He has a 16 pack-year smoking history. He has never used smokeless tobacco. He reports that he does not drink alcohol or use illicit drugs. Partially  Independent for most of his  ADL.  No Known Allergies  Family History  Problem Relation Age of Onset  . Breast cancer Mother   . Heart disease Brother   . Heart attack Brother   . Colon cancer Maternal Uncle   . Heart attack Father   . Heart attack Sister     Prior to Admission medications   Medication Sig Start Date End Date Taking? Authorizing Provider  albuterol (PROVENTIL HFA;VENTOLIN HFA) 108 (90 BASE) MCG/ACT inhaler Inhale 1-2 puffs into the lungs every 6 (six) hours as needed for wheezing. 07/29/12  Yes Hayden Rasmussen, NP  aspirin EC 81 MG tablet Take 81 mg by mouth daily. For CVA prophylaxis.   Yes Historical Provider, MD  clopidogrel (PLAVIX) 75 MG tablet Take 75 mg by mouth daily.   Yes Historical Provider, MD  escitalopram (LEXAPRO) 10 MG tablet Take 10 mg by mouth daily.   Yes Historical Provider, MD  finasteride (PROSCAR) 5 MG tablet Take 5 mg by mouth daily. 08/21/13  Yes Historical Provider, MD  HYDROcodone-acetaminophen (NORCO/VICODIN) 5-325 MG per tablet Take 1 tablet by mouth every 6 (six) hours as needed. For pain 10/14/12  Yes Historical Provider, MD  levETIRAcetam (KEPPRA) 500 MG tablet Take 1 tablet (500 mg total) by mouth every 12 (twelve) hours. 10/29/12  Yes Annett Gula, MD  lubiprostone (AMITIZA) 8 MCG capsule Take 1 capsule (8 mcg total) by mouth 2 (two)  times daily with a meal. 05/01/13  Yes Beverley Fiedler, MD  Multiple Vitamin (MULITIVITAMIN WITH MINERALS) TABS Take 1 tablet by mouth daily.   Yes Historical Provider, MD  nitrofurantoin, macrocrystal-monohydrate, (MACROBID) 100 MG capsule Take 100 mg by mouth 2 (two) times daily. 08/22/13  Yes Historical Provider, MD  nitroGLYCERIN (NITROSTAT) 0.4 MG SL tablet Place 0.4 mg under the tongue every 5 (five) minutes x 3 doses as needed. For chest pain   Yes Historical Provider, MD  oxazepam (SERAX) 30 MG capsule Take 30 mg by mouth at bedtime.   Yes Historical Provider, MD  polyethylene glycol (MIRALAX) packet Take 17 g by mouth 2 (two) times daily. 10/27/12  Yes Elease Etienne, MD  promethazine (PHENERGAN) 25 MG tablet Take 25 mg by mouth every 6 (six) hours as needed. For nausea.   Yes Historical Provider, MD  sitaGLIPtan-metformin (JANUMET) 50-1000 MG per tablet Take 1 tablet by mouth 2 (two) times daily with a meal.   Yes Historical Provider, MD  topiramate (TOPAMAX) 25 MG tablet Take 1 tablet (25 mg total) by mouth 2 (two) times daily. 01/16/13  Yes York Spaniel, MD  valsartan (DIOVAN) 160 MG tablet Take 160 mg by mouth daily.   Yes Historical Provider, MD  vitamin C (ASCORBIC ACID) 500 MG tablet Take 500 mg by mouth 2 (two) times daily.   Yes Historical Provider, MD  zinc sulfate 220 MG capsule Take 220 mg by mouth daily.   Yes Historical Provider, MD    Physical Exam: Filed Vitals:   09/07/13 2215 09/07/13 2230 09/07/13 2245 09/07/13 2300  BP: 180/77 190/78 193/84 177/91  Pulse: 72 75 82 75  Temp:      TempSrc:      Resp:      SpO2: 97% 98% 98% 98%    General: Alert, Awake and  follows command appropriately  Appear in mild distress Eyes: PERRL ENT: Oral Mucosa clear moist. Neck: No  JVD Cardiovascular: S1 and S2 Present, aortic systolic  Murmur, Peripheral Pulses Present Respiratory: Bilateral Air entry equal and Decreased, Clear to Auscultation,  No  Crackles,no  wheezes Abdomen:  Bowel Sound Present, Soft and diffusely  tenderWithout any guarding or rigidity  Skin: No Rash Extremities: No  Pedal edema, no  calf tenderness, tenderness on touch to bilateral ankle and lower foot, small bruise noted on right foot Neurologic:  expressive aphasia, otherwise no focal deficit  Labs on Admission:  CBC:  Recent Labs Lab 09/07/13 1712  WBC 7.1  NEUTROABS 4.6  HGB 14.6  HCT 41.7  MCV 81.0  PLT 179    CMP     Component Value Date/Time   NA 137 09/07/2013 1712   K 4.0 09/07/2013 1712   CL 97 09/07/2013 1712   CO2 29  09/07/2013 1712   GLUCOSE 256* 09/07/2013 1712   BUN 19 09/07/2013 1712   CREATININE 1.25 09/07/2013 1712   CALCIUM 9.1 09/07/2013 1712   PROT 6.5 09/07/2013 1712   ALBUMIN 3.1* 09/07/2013 1712   AST 11 09/07/2013 1712   ALT 10 09/07/2013 1712   ALKPHOS 81 09/07/2013 1712   BILITOT 0.2* 09/07/2013 1712   GFRNONAA 56* 09/07/2013 1712   GFRAA 65* 09/07/2013 1712     Recent Labs Lab 09/07/13 1712  LIPASE 26   No results found for this basename: AMMONIA,  in the last 168 hours  No results found for this basename: CKTOTAL, CKMB, CKMBINDEX, TROPONINI,  in the last 168 hours BNP (last 3 results) No results found for this basename: PROBNP,  in the last 8760 hours  Radiological Exams on Admission: Dg Chest 2 View  09/07/2013   CLINICAL DATA:  Chest pain.  Cancer.  Diabetes.  EXAM: CHEST  2 VIEW  COMPARISON:  10/25/2012.  FINDINGS: Cardiopericardial silhouette within normal limits. Mediastinal contours normal. Trachea midline. No airspace disease or effusion. CABG. Stable appearance of broken median sternotomy wires.  IMPRESSION: No interval change or active cardiopulmonary disease.   Electronically Signed   By: Andreas Newport M.D.   On: 09/07/2013 18:08   Ct Abdomen Pelvis W Contrast  09/07/2013   CLINICAL DATA:  Abdominal pain.  EXAM: CT ABDOMEN AND PELVIS WITH CONTRAST  TECHNIQUE: Multidetector CT imaging of the abdomen and pelvis was  performed using the standard protocol following bolus administration of intravenous contrast.  CONTRAST:  OMNIPAQUE IOHEXOL 300 MG/ML  SOLN  COMPARISON:  08/25/2013.  FINDINGS: Lung Bases: Dependent atelectasis.  CABG.  Liver:  Normal.  Spleen:  Normal.  Gallbladder:  Cholelithiasis.  Common bile duct:  No common duct stone identified.  Pancreas:  Normal.  Adrenal glands: Unchanged right adrenal low-attenuation lesion likely representing adenoma.  Kidneys: Multiple bilateral renal cysts. 1 cm calculus in the right renal pelvis. Right ureter appears normal. Left ureter is also normal.  Stomach:  Normal.  Small bowel:  Normal.  Colon: Ileocolonic anastomosis in the right lower quadrant. No complicating features. Moderate stool burden.  Pelvic Genitourinary: Thickening and enhancement of the urinary bladder suggesting cystitis. Mild trabeculation over the bladder dome. Prostatomegaly.  Bones:  No aggressive osseous lesion.  Vasculature: Atherosclerosis.  No acute vascular abnormality.  Body Wall: Normal.  IMPRESSION: 1. Mural enhancement and thickening of the urinary bladder wall compatible with cystitis. 2. Uncomplicated right lower quadrant ileocolonic anastomosis. 3. Cholelithiasis.  No CT findings of acute cholecystitis. 4. Nonobstructing right renal pelvis calculus. Bilateral renal cysts. 5. Low-attenuation lesion in the right adrenal likely represents adenoma.   Electronically Signed   By: Andreas Newport M.D.   On: 09/07/2013 22:10   Dg Abd 2 Views  09/06/2013   CLINICAL DATA:  Abdominal pain and constipation.  EXAM: ABDOMEN - 2 VIEW  COMPARISON:  Abdominal and pelvic CT scan of August 25, 2013.  FINDINGS: The bowel gas pattern is within the limits of normal. I cannot exclude constipation. A dense rounded calcification is consistent with a known 1.2 cm stone in the right renal pelvis. There are phleboliths within the pelvis. There seminal vesicle calcification is well in the pelvis. Vascular arterial  calcifications also are demonstrated. There is surgical suture material in the right mid abdomen. The bony structures exhibit no acute abnormalities.  IMPRESSION: 1. The bowel gas pattern may indicate constipation. There is no evidence of ileus or obstruction. 2.  There is a 1.2 cm diameter stone on the right which likely corresponds to the known stone in the right renal pelvis. The known gallstones are not clearly evident. Sign rib   Electronically Signed   By: David  [place]   On: 09/06/2013 13:13    EKG: Independently reviewed. normal sinus rhythm, nonspecific ST and T waves changes.  Assessment/Plan Principal Problem:   Urinary tract infection Active Problems:   CVA (cerebral infarction)   DM (diabetes mellitus), type 2, uncontrolled   HTN (hypertension), benign   Expressive aphasia   1. Urinary tract infection The patient is presenting with shortness of suprapubic pain, his CT scan shows cystitis other than that no acute finding. His urine cultures suggest UTI and he has mild acute kidney injury. With this he will be started on IV ceftriaxone. IV fluids will be given, Urine culture will be awaited,  2. Chest pain The patient did complain about chest pain to the ED physician but on my better asking he is not mentioning that he has chest pain. His initial troponin is negative and EKG does not show any acute abnormality. He has a negative pharmacological stress test done on 09/03/2013. Therefore possibility of ACS is less likely, he does not appear to have any leg swelling Or calf tenderness, he denies any mobilization and mentions he walks on his own. Therefore PE is also less likely. Negative chest x-ray rules out a possibility of pneumonia. We'll continue to monitor him on telemetry.  3. Diabetes mellitus Pacing the patient on sliding scale  4. Constipation One dose of milk of magnesia and continue him on MiraLax  DVT Prophylaxis: subcutaneous Heparin Nutrition: Diabetic cardiac  diet  Code Status: Full  Disposition: Admitted to observation in telemetry unit.  Author: Lynden Oxford, MD [facility] Pager: [phone] 09/07/2013, 11:37 PM    If 7PM-7AM, please contact night-coverage [email or web address] Password [email or web address]

## 2013-09-07 NOTE — ED Provider Notes (Signed)
CSN: [id number]     Arrival date & time 09/07/13  1553 History   First MD Initiated Contact with Patient 09/07/13 1616     Chief Complaint  Patient presents with  . hurting all over    HPI  [name] is a 72 y/o male with history of stroke in 2013, MI in 2013, insulin-dependent diabetes, hypertension, COPD, and colon cancer status post right hemicolectomy who presents with cc of "pain all over". The patient is non-verbal secondary to his previous CVA. When asked to point where he hurts the most he points to his chest and supra pubically. The patient's wife is in attendance and is unable to provide any additional history other than he is hurting. They were seen yesterday at Urgent care for the same symptoms and was discharged home after having a reassuring exam. They have continued with the keflex and miralax. The patient hasn't had any vomiting per the wife. Both are unable to tell me when his last bowel movement. He is unable to describe his chest in any detail but does point to the middle of his chest when asked where he is hurting. When asked if he has had any fevers the patient's wife states that his forehead felt warm yesterday but otherwise she hasn't checked his temperature. The triage note suggest the patient mentioned pain in his testicles. When I asked the patient if he testicles hurt he shook his head no.   Past Medical History  Diagnosis Date  . Myocardial infarction   . CVA (cerebral infarction)   . Diabetes mellitus   . Hypertension   . Diabetes mellitus   . Aphasia   . Dysphagia   . COPD (chronic obstructive pulmonary disease)   . Shortness of breath   . Anemia   . Stroke   . Colon cancer   . Hemorrhoid   . Intractable seizures   . Pseudobulbar affect   . Dyslipidemia   . CAD (coronary artery disease)   . History of hemorrhoids    Past Surgical History  Procedure Laterality Date  . Coronary artery bypass graft    . Appendectomy    . Dental surgery    .  Colonoscopy  03/13/2012    Procedure: COLONOSCOPY;  Surgeon: Hart Carwin, MD;  Location: [facility];  Service: Endoscopy;  Laterality: N/A;  . Partial colectomy  03/22/2012    Procedure: PARTIAL COLECTOMY;  Surgeon: Cherylynn Ridges, MD;  Location: [facility] OR;  Service: General;  Laterality: Right;  . Coronary artery bypass graft      x3  . Coronary stent placement     Family History  Problem Relation Age of Onset  . Breast cancer Mother   . Heart disease Brother   . Heart attack Brother   . Colon cancer Maternal Uncle   . Heart attack Father   . Heart attack Sister    History  Substance Use Topics  . Smoking status: Current Some Day Smoker -- 1.00 packs/day for 16 years    Types: Cigarettes  . Smokeless tobacco: Never Used  . Alcohol Use: No    Review of Systems  Unable to perform ROS: Patient nonverbal    Allergies  Review of patient's allergies indicates no known allergies.  Home Medications   Current Outpatient Rx  Name  Route  Sig  Dispense  Refill  . albuterol (PROVENTIL HFA;VENTOLIN HFA) 108 (90 BASE) MCG/ACT inhaler   Inhalation   Inhale 1-2 puffs into the lungs every 6 (  six) hours as needed for wheezing.   1 Inhaler   0   . aspirin EC 81 MG tablet   Oral   Take 81 mg by mouth daily. For CVA prophylaxis.         Marland Kitchen clopidogrel (PLAVIX) 75 MG tablet   Oral   Take 75 mg by mouth daily.         Marland Kitchen escitalopram (LEXAPRO) 10 MG tablet   Oral   Take 10 mg by mouth daily.         . finasteride (PROSCAR) 5 MG tablet   Oral   Take 5 mg by mouth daily.         Marland Kitchen HYDROcodone-acetaminophen (NORCO/VICODIN) 5-325 MG per tablet   Oral   Take 1 tablet by mouth every 6 (six) hours as needed. For pain         . levETIRAcetam (KEPPRA) 500 MG tablet   Oral   Take 1 tablet (500 mg total) by mouth every 12 (twelve) hours.   60 tablet   1   . lubiprostone (AMITIZA) 8 MCG capsule   Oral   Take 1 capsule (8 mcg total) by mouth 2 (two) times daily with a meal.    60 capsule   6   . Multiple Vitamin (MULITIVITAMIN WITH MINERALS) TABS   Oral   Take 1 tablet by mouth daily.         . nitrofurantoin, macrocrystal-monohydrate, (MACROBID) 100 MG capsule   Oral   Take 100 mg by mouth 2 (two) times daily.         . nitroGLYCERIN (NITROSTAT) 0.4 MG SL tablet   Sublingual   Place 0.4 mg under the tongue every 5 (five) minutes x 3 doses as needed. For chest pain         . oxazepam (SERAX) 30 MG capsule   Oral   Take 30 mg by mouth at bedtime.         . polyethylene glycol (MIRALAX) packet   Oral   Take 17 g by mouth 2 (two) times daily.   30 each   0   . promethazine (PHENERGAN) 25 MG tablet   Oral   Take 25 mg by mouth every 6 (six) hours as needed. For nausea.         . sitaGLIPtan-metformin (JANUMET) 50-1000 MG per tablet   Oral   Take 1 tablet by mouth 2 (two) times daily with a meal.         . topiramate (TOPAMAX) 25 MG tablet   Oral   Take 1 tablet (25 mg total) by mouth 2 (two) times daily.   60 tablet   3   . valsartan (DIOVAN) 160 MG tablet   Oral   Take 160 mg by mouth daily.         . vitamin C (ASCORBIC ACID) 500 MG tablet   Oral   Take 500 mg by mouth 2 (two) times daily.         Marland Kitchen zinc sulfate 220 MG capsule   Oral   Take 220 mg by mouth daily.          BP 167/92  Pulse 76  Temp(Src) 99.4 F (37.4 C) (Oral)  Resp 77  SpO2 98% Physical Exam  Constitutional: He is oriented to person, place, and time. He appears well-developed and well-nourished. No distress.  HENT:  Head: Normocephalic and atraumatic.  Mouth/Throat: No oropharyngeal exudate.  Eyes: Conjunctivae are normal. Pupils are equal,  round, and reactive to light.  Neck: Normal range of motion. Neck supple.  Cardiovascular: Normal rate and normal heart sounds.  Exam reveals no gallop and no friction rub.   No murmur heard. Pulmonary/Chest: Effort normal and breath sounds normal.  Abdominal: Soft. He exhibits no distension. There is  tenderness (moderate diffuse with most tenderness in suprapubically). Hernia confirmed negative in the right inguinal area and confirmed negative in the left inguinal area.  Genitourinary: Testes normal. Prostate is not tender. Cremasteric reflex is present. Right testis shows no tenderness. Left testis shows no tenderness. Uncircumcised.  Musculoskeletal: Normal range of motion. He exhibits no edema and no tenderness.  Neurological: He is alert and oriented to person, place, and time. He has normal strength and normal reflexes. Tremors: Non-Verbal. No cranial nerve deficit or sensory deficit. Coordination normal. GCS eye subscore is 4. GCS verbal subscore is 5. GCS motor subscore is 6.  Skin: Skin is warm and dry.  Several scattered insect bites in lower extremities. None appear to be infected.     ED Course  Procedures (including critical care time) Labs Review Labs Reviewed  COMPREHENSIVE METABOLIC PANEL - Abnormal; Notable for the following:    Glucose, Bld 256 (*)    Albumin 3.1 (*)    Total Bilirubin 0.2 (*)    GFR calc non Af Amer 56 (*)    GFR calc Af Amer 65 (*)    All other components within normal limits  URINALYSIS, ROUTINE W REFLEX MICROSCOPIC - Abnormal; Notable for the following:    APPearance CLOUDY (*)    Glucose, UA 500 (*)    Hgb urine dipstick SMALL (*)    Protein, ur >300 (*)    Leukocytes, UA SMALL (*)    All other components within normal limits  CG4 I-STAT (LACTIC ACID) - Abnormal; Notable for the following:    Lactic Acid, Venous 2.31 (*)    All other components within normal limits  URINE CULTURE  CBC WITH DIFFERENTIAL  LIPASE, BLOOD  URINE MICROSCOPIC-ADD ON  CK  POCT I-STAT TROPONIN I   Imaging Review Dg Chest 2 View  09/07/2013   CLINICAL DATA:  Chest pain.  Cancer.  Diabetes.  EXAM: CHEST  2 VIEW  COMPARISON:  10/25/2012.  FINDINGS: Cardiopericardial silhouette within normal limits. Mediastinal contours normal. Trachea midline. No airspace disease  or effusion. CABG. Stable appearance of broken median sternotomy wires.  IMPRESSION: No interval change or active cardiopulmonary disease.   Electronically Signed   By: Andreas Newport M.D.   On: 09/07/2013 18:08   Ct Abdomen Pelvis W Contrast  09/07/2013   CLINICAL DATA:  Abdominal pain.  EXAM: CT ABDOMEN AND PELVIS WITH CONTRAST  TECHNIQUE: Multidetector CT imaging of the abdomen and pelvis was performed using the standard protocol following bolus administration of intravenous contrast.  CONTRAST:  OMNIPAQUE IOHEXOL 300 MG/ML  SOLN  COMPARISON:  08/25/2013.  FINDINGS: Lung Bases: Dependent atelectasis.  CABG.  Liver:  Normal.  Spleen:  Normal.  Gallbladder:  Cholelithiasis.  Common bile duct:  No common duct stone identified.  Pancreas:  Normal.  Adrenal glands: Unchanged right adrenal low-attenuation lesion likely representing adenoma.  Kidneys: Multiple bilateral renal cysts. 1 cm calculus in the right renal pelvis. Right ureter appears normal. Left ureter is also normal.  Stomach:  Normal.  Small bowel:  Normal.  Colon: Ileocolonic anastomosis in the right lower quadrant. No complicating features. Moderate stool burden.  Pelvic Genitourinary: Thickening and enhancement of the urinary bladder  suggesting cystitis. Mild trabeculation over the bladder dome. Prostatomegaly.  Bones:  No aggressive osseous lesion.  Vasculature: Atherosclerosis.  No acute vascular abnormality.  Body Wall: Normal.  IMPRESSION: 1. Mural enhancement and thickening of the urinary bladder wall compatible with cystitis. 2. Uncomplicated right lower quadrant ileocolonic anastomosis. 3. Cholelithiasis.  No CT findings of acute cholecystitis. 4. Nonobstructing right renal pelvis calculus. Bilateral renal cysts. 5. Low-attenuation lesion in the right adrenal likely represents adenoma.   Electronically Signed   By: Andreas Newport M.D.   On: 09/07/2013 22:10   Dg Abd 2 Views  09/06/2013   CLINICAL DATA:  Abdominal pain and  constipation.  EXAM: ABDOMEN - 2 VIEW  COMPARISON:  Abdominal and pelvic CT scan of August 25, 2013.  FINDINGS: The bowel gas pattern is within the limits of normal. I cannot exclude constipation. A dense rounded calcification is consistent with a known 1.2 cm stone in the right renal pelvis. There are phleboliths within the pelvis. There seminal vesicle calcification is well in the pelvis. Vascular arterial calcifications also are demonstrated. There is surgical suture material in the right mid abdomen. The bony structures exhibit no acute abnormalities.  IMPRESSION: 1. The bowel gas pattern may indicate constipation. There is no evidence of ileus or obstruction. 2. There is a 1.2 cm diameter stone on the right which likely corresponds to the known stone in the right renal pelvis. The known gallstones are not clearly evident. Sign rib   Electronically Signed   By: David  [place]   On: 09/06/2013 13:13    EKG Interpretation   None       MDM   Mr. Hull is a 72 y/o male with history of stroke in 2013, MI in 2013, insulin-dependent diabetes, hypertension, COPD, and colon cancer status post right hemicolectomy who presents with cc of "pain all over".  Primarily complains of abdominal pain. History is limited. Exam with moderate tenderness diffusely with most prominent tenderness suprapubically. GU exam normal. CXR without evidence of pneumonia, dissection or pneumothorax. EKG without acute ischemic changes. Initial troponin negative. CT without acute findings with thickening of the bladder. UA with TNTC WBC's. Likely partially treated UTI. Concern for treatment failure given continued symptoms. Given mildly elevated lactate the patient was given IVF and rocephin. Will admit to internal medicine for continued workup and management.    1. Urinary tract infection      Shanon Ace, MD 09/08/13 [phone]

## 2013-09-07 NOTE — ED Notes (Signed)
This RN attempted IV access x3. x2 with blood return but unable to advance. Thayer Ohm RN looked at veins.

## 2013-09-07 NOTE — ED Notes (Signed)
Patient transported to CT 

## 2013-09-07 NOTE — ED Notes (Signed)
Pt having an episode of fully body shaking- RN sternal rubbed pt and patient responding to RNs questioning.

## 2013-09-07 NOTE — ED Provider Notes (Signed)
I saw and evaluated the patient, reviewed the resident's note and I agree with the findings and plan.  EKG Interpretation   None      patient seen and examined. Urinalysis shows severe urinary tract infection. Awaiting results of abdominal CT scan. Due to the patient's increased weakness, he will require inpatient admission.    Toy Baker, MD 09/07/13 [phone]

## 2013-09-07 NOTE — ED Provider Notes (Signed)
This patient has pseudobulbar affect. Therefore the history and physical is quite challenging. Regardless his abdomen appears to be soft and nontender. His recent CT scan does not show any significant acute process and his KUB today is reassuring. Essentially I do not feel the patient needs criteria for transfer to the emergency room at this time. Limited workup in this clinic today was reassuring. Plan for general laxatives and followup with primary care provider. If worsening or not improving recommended presenting to the emergency room or return to urgent care. The patient's wife expresses understanding and agreement.  Medical screening examination/treatment/procedure(s) were performed by a resident physician or non-physician practitioner and as the supervising physician I was immediately available for consultation/collaboration.  Clementeen Graham, MD   Rodolph Bong, MD 09/07/13 [phone]

## 2013-09-07 NOTE — ED Notes (Signed)
Attempted to inform CT pt has finished contrast- unable to reach them.

## 2013-09-07 NOTE — ED Notes (Signed)
Pt. Shaking leg and arm. Then turning head from side to side. Still able to answer questions about pain during shaking episode.

## 2013-09-07 NOTE — ED Notes (Signed)
Bringolf MD made aware of shaking, pain and BP

## 2013-09-07 NOTE — ED Notes (Signed)
The pt has been hurting all over his body for one month.  He is complaining of pain in his testicles

## 2013-09-08 ENCOUNTER — Observation Stay (HOSPITAL_COMMUNITY): Payer: Medicare Other

## 2013-09-08 LAB — PROTIME-INR
INR: 0.95 (ref 0.00–1.49)
Prothrombin Time: 12.5 seconds (ref 11.6–15.2)

## 2013-09-08 LAB — GLUCOSE, CAPILLARY
Glucose-Capillary: 166 mg/dL — ABNORMAL HIGH (ref 70–99)
Glucose-Capillary: 184 mg/dL — ABNORMAL HIGH (ref 70–99)

## 2013-09-08 LAB — COMPREHENSIVE METABOLIC PANEL
ALT: 9 U/L (ref 0–53)
AST: 20 U/L (ref 0–37)
Albumin: 3 g/dL — ABNORMAL LOW (ref 3.5–5.2)
BUN: 14 mg/dL (ref 6–23)
Calcium: 8.6 mg/dL (ref 8.4–10.5)
Chloride: 99 mEq/L (ref 96–112)
Creatinine, Ser: 0.96 mg/dL (ref 0.50–1.35)
GFR calc Af Amer: >90 mL/min (ref >90)
Sodium: 135 mEq/L (ref 135–145)
Total Bilirubin: 0.3 mg/dL (ref 0.3–1.2)
Total Protein: 6.3 g/dL (ref 6.0–8.3)

## 2013-09-08 LAB — CBC
HCT: 43.1 % (ref 39.0–52.0)
Hemoglobin: 15.3 g/dL (ref 13.0–17.0)
MCH: 28.8 pg (ref 26.0–34.0)
MCV: 81.2 fL (ref 78.0–100.0)
RBC: 5.31 MIL/uL (ref 4.22–5.81)
WBC: 9 10*3/uL (ref 4.0–10.5)

## 2013-09-08 LAB — TROPONIN I
Troponin I: <0.3 ng/mL (ref <0.30)
Troponin I: <0.3 ng/mL (ref <0.30)
Troponin I: <0.3 ng/mL (ref <0.30)

## 2013-09-08 LAB — TSH: TSH: 4.502 u[IU]/mL — ABNORMAL HIGH (ref 0.350–4.500)

## 2013-09-08 MED ORDER — INSULIN ASPART 100 UNIT/ML ~~LOC~~ SOLN
0.0000 [IU] | Freq: Three times a day (TID) | SUBCUTANEOUS | Status: DC
  Administered 2013-09-08: 2 [IU] via SUBCUTANEOUS
  Administered 2013-09-08 – 2013-09-09 (×3): 3 [IU] via SUBCUTANEOUS
  Administered 2013-09-09 (×2): 2 [IU] via SUBCUTANEOUS
  Administered 2013-09-10: 5 [IU] via SUBCUTANEOUS
  Administered 2013-09-10: 2 [IU] via SUBCUTANEOUS
  Administered 2013-09-11: 3 [IU] via SUBCUTANEOUS
  Administered 2013-09-11: 2 [IU] via SUBCUTANEOUS
  Administered 2013-09-11 – 2013-09-12 (×2): 3 [IU] via SUBCUTANEOUS

## 2013-09-08 MED ORDER — HYDRALAZINE HCL 20 MG/ML IJ SOLN: 5.0000 mg | Freq: Four times a day (QID) | INTRAMUSCULAR | Status: DC | PRN

## 2013-09-08 MED ORDER — ALBUTEROL SULFATE HFA 108 (90 BASE) MCG/ACT IN AERS: 1.0000 | INHALATION_SPRAY | Freq: Four times a day (QID) | RESPIRATORY_TRACT | Status: DC | PRN

## 2013-09-08 MED ORDER — NITROGLYCERIN 0.4 MG SL SUBL: 0.4000 mg | SUBLINGUAL_TABLET | SUBLINGUAL | Status: DC | PRN

## 2013-09-08 MED ORDER — FINASTERIDE 5 MG PO TABS
5.0000 mg | ORAL_TABLET | Freq: Every day | ORAL | Status: DC
  Administered 2013-09-08 – 2013-09-12 (×5): 5 mg via ORAL
  Filled 2013-09-08 (×5): qty 1

## 2013-09-08 MED ORDER — SODIUM CHLORIDE 0.9 % IJ SOLN
3.0000 mL | Freq: Two times a day (BID) | INTRAMUSCULAR | Status: DC
  Administered 2013-09-08 – 2013-09-12 (×9): 3 mL via INTRAVENOUS

## 2013-09-08 MED ORDER — POLYETHYLENE GLYCOL 3350 17 G PO PACK
17.0000 g | PACK | Freq: Two times a day (BID) | ORAL | Status: DC
  Administered 2013-09-08: 17 g via ORAL
  Filled 2013-09-08 (×4): qty 1

## 2013-09-08 MED ORDER — HYDROCODONE-ACETAMINOPHEN 5-325 MG PO TABS: 1.0000 | ORAL_TABLET | Freq: Four times a day (QID) | ORAL | Status: DC | PRN

## 2013-09-08 MED ORDER — ACETAMINOPHEN 650 MG RE SUPP: 650.0000 mg | Freq: Four times a day (QID) | RECTAL | Status: DC | PRN

## 2013-09-08 MED ORDER — ACETAMINOPHEN 325 MG PO TABS: 650.0000 mg | ORAL_TABLET | Freq: Four times a day (QID) | ORAL | Status: DC | PRN

## 2013-09-08 MED ORDER — SODIUM CHLORIDE 0.9 % IV SOLN
INTRAVENOUS | Status: AC
  Administered 2013-09-08: 01:00:00 via INTRAVENOUS

## 2013-09-08 MED ORDER — DEXTROSE 5 % IV SOLN
1.0000 g | INTRAVENOUS | Status: DC
  Administered 2013-09-08 – 2013-09-10 (×3): 1 g via INTRAVENOUS
  Filled 2013-09-08 (×4): qty 10

## 2013-09-08 MED ORDER — LEVETIRACETAM 500 MG PO TABS
500.0000 mg | ORAL_TABLET | Freq: Two times a day (BID) | ORAL | Status: DC
  Administered 2013-09-08 – 2013-09-12 (×10): 500 mg via ORAL
  Filled 2013-09-08 (×12): qty 1

## 2013-09-08 MED ORDER — MAGNESIUM HYDROXIDE 400 MG/5ML PO SUSP
15.0000 mL | Freq: Once | ORAL | Status: AC
  Administered 2013-09-08: 15 mL via ORAL
  Filled 2013-09-08: qty 30

## 2013-09-08 MED ORDER — ONDANSETRON HCL 4 MG PO TABS: 4.0000 mg | ORAL_TABLET | Freq: Four times a day (QID) | ORAL | Status: DC | PRN

## 2013-09-08 MED ORDER — HEPARIN SODIUM (PORCINE) 5000 UNIT/ML IJ SOLN
5000.0000 [IU] | Freq: Three times a day (TID) | INTRAMUSCULAR | Status: DC
  Administered 2013-09-08 – 2013-09-11 (×12): 5000 [IU] via SUBCUTANEOUS
  Filled 2013-09-08 (×17): qty 1

## 2013-09-08 MED ORDER — TOPIRAMATE 25 MG PO TABS
25.0000 mg | ORAL_TABLET | Freq: Two times a day (BID) | ORAL | Status: DC
  Administered 2013-09-08 – 2013-09-12 (×10): 25 mg via ORAL
  Filled 2013-09-08 (×12): qty 1

## 2013-09-08 MED ORDER — ONDANSETRON HCL 4 MG/2ML IJ SOLN: 4.0000 mg | Freq: Four times a day (QID) | INTRAMUSCULAR | Status: DC | PRN

## 2013-09-08 MED ORDER — STARCH (THICKENING) PO POWD
ORAL | Status: DC | PRN
  Filled 2013-09-08: qty 227

## 2013-09-08 MED ORDER — INSULIN ASPART 100 UNIT/ML ~~LOC~~ SOLN: 0.0000 [IU] | Freq: Every day | SUBCUTANEOUS | Status: DC

## 2013-09-08 MED ORDER — LUBIPROSTONE 8 MCG PO CAPS
8.0000 ug | ORAL_CAPSULE | Freq: Two times a day (BID) | ORAL | Status: DC
  Administered 2013-09-08 – 2013-09-12 (×9): 8 ug via ORAL
  Filled 2013-09-08 (×11): qty 1

## 2013-09-08 MED ORDER — ESCITALOPRAM OXALATE 10 MG PO TABS
10.0000 mg | ORAL_TABLET | Freq: Every day | ORAL | Status: DC
  Administered 2013-09-08 – 2013-09-12 (×5): 10 mg via ORAL
  Filled 2013-09-08 (×5): qty 1

## 2013-09-08 MED ORDER — CLOPIDOGREL BISULFATE 75 MG PO TABS
75.0000 mg | ORAL_TABLET | Freq: Every day | ORAL | Status: DC
  Administered 2013-09-08 – 2013-09-12 (×5): 75 mg via ORAL
  Filled 2013-09-08 (×7): qty 1

## 2013-09-08 MED ORDER — IRBESARTAN 150 MG PO TABS
150.0000 mg | ORAL_TABLET | Freq: Every day | ORAL | Status: DC
  Administered 2013-09-08 – 2013-09-12 (×5): 150 mg via ORAL
  Filled 2013-09-08 (×5): qty 1

## 2013-09-08 MED ORDER — ASPIRIN EC 81 MG PO TBEC
81.0000 mg | DELAYED_RELEASE_TABLET | Freq: Every day | ORAL | Status: DC
  Administered 2013-09-08 – 2013-09-12 (×5): 81 mg via ORAL
  Filled 2013-09-08 (×5): qty 1

## 2013-09-08 NOTE — Progress Notes (Signed)
Rehab Admissions Coordinator Note:  Patient was screened by Trish Mage for appropriateness for an Inpatient Acute Rehab Consult.  Noted PT/OT recommending CIR.  At this time, we are recommending Inpatient Rehab consult.  Trish Mage 09/08/2013, 2:37 PM  I can be reached at [phone].

## 2013-09-08 NOTE — Progress Notes (Signed)
UR completed.  Patient has been changed to inpatient r/t requiring IVF @ 75/hr and IV antibiotics.

## 2013-09-08 NOTE — Consult Note (Signed)
Physical Medicine and Rehabilitation Consult  Reason for Consult: Weakness Referring Physician:  Dr. Rito Ehrlich.   HPI: [name] is a 72 y.o. 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 He was started on IV rocephin for UTI and bowel program for constipation. CT abdomen/pelvis with evidence of cystitis, and non-obstructive right renal pelvis calculi as well as multiple cysts and right adrenal lesion.  CXR clear. Right foot x rays negative for fracture.  PT evaluation done 12/22 revealing poor balance with shuffling gait and narrow BOS. MD, OT and PT recommending CIR.    Review of Systems  Respiratory: Negative for shortness of breath.   Cardiovascular: Negative for chest pain.  Gastrointestinal: Positive for abdominal pain.  Genitourinary: Positive for frequency.  Musculoskeletal: Positive for falls (he reports that he does not use assistive device. ). Negative for back pain, joint pain and myalgias.  Neurological: Positive for weakness. Negative for headaches.    Past Medical History  Diagnosis Date  . Myocardial infarction   . CVA (cerebral infarction)   . Diabetes mellitus   . Hypertension   . Diabetes mellitus   . Aphasia   . Dysphagia   . COPD (chronic obstructive pulmonary disease)   . Shortness of breath   . Anemia   . Stroke   . Colon cancer   . Hemorrhoid   . Intractable seizures   . Pseudobulbar affect   . Dyslipidemia   . CAD (coronary artery disease)   . History of hemorrhoids    Past Surgical History  Procedure Laterality Date  . Coronary artery bypass graft    . Appendectomy    . Dental surgery    . Colonoscopy  03/13/2012    Procedure: COLONOSCOPY;  Surgeon: Hart Carwin, MD;  Location: [facility];  Service: Endoscopy;  Laterality: N/A;  . Partial colectomy  03/22/2012    Procedure: PARTIAL COLECTOMY;  Surgeon:  Cherylynn Ridges, MD;  Location: [facility] OR;  Service: General;  Laterality: Right;  . Coronary artery bypass graft      x3  . Coronary stent placement     Family History  Problem Relation Age of Onset  . Breast cancer Mother   . Heart disease Brother   . Heart attack Brother   . Colon cancer Maternal Uncle   . Heart attack Father   . Heart attack Sister    Social History:  Married. Lives with family and required assistance with ADLs and SBA for mobility. Per  reports that he has been smoking Cigarettes.  He has a 16 pack-year smoking history. He has never used smokeless tobacco. He reports that he does not drink alcohol or use illicit drugs.  Allergies: No Known Allergies  Medications Prior to Admission  Medication Sig Dispense Refill  . albuterol (PROVENTIL HFA;VENTOLIN HFA) 108 (90 BASE) MCG/ACT inhaler Inhale 1-2 puffs into the lungs every 6 (six) hours as needed for wheezing.  1 Inhaler  0  . aspirin EC 81 MG tablet Take 81 mg by mouth daily. For CVA prophylaxis.      Marland Kitchen clopidogrel (PLAVIX) 75 MG tablet Take 75 mg by mouth daily.      Marland Kitchen escitalopram (LEXAPRO) 10 MG tablet Take 10 mg by mouth daily.      . finasteride (PROSCAR) 5 MG tablet Take 5 mg by mouth daily.      Marland Kitchen  HYDROcodone-acetaminophen (NORCO/VICODIN) 5-325 MG per tablet Take 1 tablet by mouth every 6 (six) hours as needed. For pain      . levETIRAcetam (KEPPRA) 500 MG tablet Take 1 tablet (500 mg total) by mouth every 12 (twelve) hours.  60 tablet  1  . lubiprostone (AMITIZA) 8 MCG capsule Take 1 capsule (8 mcg total) by mouth 2 (two) times daily with a meal.  60 capsule  6  . Multiple Vitamin (MULITIVITAMIN WITH MINERALS) TABS Take 1 tablet by mouth daily.      . nitrofurantoin, macrocrystal-monohydrate, (MACROBID) 100 MG capsule Take 100 mg by mouth 2 (two) times daily.      . nitroGLYCERIN (NITROSTAT) 0.4 MG SL tablet Place 0.4 mg under the tongue every 5 (five) minutes x 3 doses as needed. For chest pain      . oxazepam  (SERAX) 30 MG capsule Take 30 mg by mouth at bedtime.      . polyethylene glycol (MIRALAX) packet Take 17 g by mouth 2 (two) times daily.  30 each  0  . promethazine (PHENERGAN) 25 MG tablet Take 25 mg by mouth every 6 (six) hours as needed. For nausea.      . sitaGLIPtan-metformin (JANUMET) 50-1000 MG per tablet Take 1 tablet by mouth 2 (two) times daily with a meal.      . topiramate (TOPAMAX) 25 MG tablet Take 1 tablet (25 mg total) by mouth 2 (two) times daily.  60 tablet  3  . valsartan (DIOVAN) 160 MG tablet Take 160 mg by mouth daily.      . vitamin C (ASCORBIC ACID) 500 MG tablet Take 500 mg by mouth 2 (two) times daily.      Marland Kitchen zinc sulfate 220 MG capsule Take 220 mg by mouth daily.        Home: Home Living Family/patient expects to be discharged to:: Private residence Living Arrangements: Spouse/significant other;Children Available Help at Discharge: Family;Available 24 hours/day Type of Home: House Home Access: Ramped entrance Home Layout: One level Home Equipment: Bedside commode;Wheelchair - manual Additional Comments: Daughter arrived at end of session and reported that pt does have a w/c but he will not use it.  States he has a rollator with a seat that he will often use.  Daughter reports that pt has h/o falls at home, states his "legs will just give out at times."    Functional History:   Functional Status:  Mobility: Bed Mobility Bed Mobility: Supine to Sit;Sitting - Scoot to Edge of Bed Left Sidelying to Sit: 3: Mod assist Supine to Sit: 3: Mod assist;HOB elevated;With rails Sitting - Scoot to Edge of Bed: 4: Min assist;With rail Sit to Supine: 3: Mod assist Transfers Transfers: Sit to Stand;Stand to Sit Sit to Stand: 4: Min assist;From bed;With upper extremity assist Stand to Sit: 4: Min assist;To chair/3-in-1;With armrests;With upper extremity assist Ambulation/Gait Ambulation/Gait Assistance: 4: Min assist Ambulation Distance (Feet): 4 Feet Assistive device:  1 person hand held assist Ambulation/Gait Assistance Details: pt unsteady with ambulating; ambulates with narrow BOS and short shuffled gt; tends to lean to Rt today with ambulating; attempted to sit prior to reaching chair; decr safety awareness  Gait Pattern: Decreased stride length;Narrow base of support;Shuffle Gait velocity: decreased  Stairs: No Wheelchair Mobility Wheelchair Mobility: No  ADL: ADL Grooming: Performed;Wash/dry face;Minimal assistance Where Assessed - Grooming: Unsupported sitting Lower Body Dressing: Performed;+1 Total assistance (donn socks) Where Assessed - Lower Body Dressing: Supported sitting ADL Comments: Pt restless in bed upon  OT arrival, attempting to kick legs OOB. Pt sat EOB ~2 minutes for grooming task. ADL assessment limited due to arrival of transport to take pt to ultrasound.  Pt became labile about halfway through session.    Cognition: Cognition Overall Cognitive Status: Difficult to assess Orientation Level: Oriented to person;Oriented to place;Oriented to time Cognition Arousal/Alertness: Awake/alert Behavior During Therapy: Restless;Impulsive Overall Cognitive Status: Difficult to assess Difficult to assess due to: Impaired communication  Blood pressure 138/76, pulse 72, temperature 98.7 F (37.1 C), temperature source Oral, resp. rate 20, height 5\' 4"  (1.626 m), weight 71.413 kg (157 lb 7 oz), SpO2 97.00%. Physical Exam  Nursing note and vitals reviewed. Constitutional: He appears well-developed and well-nourished.  Strong odor of urine in room  HENT:  Head: Normocephalic and atraumatic.  Eyes: Conjunctivae are normal. Pupils are equal, round, and reactive to light.  Cardiovascular: Normal rate and regular rhythm.   Respiratory: Effort normal and breath sounds normal. No respiratory distress.  GI: Soft. Bowel sounds are normal. He exhibits no distension. There is tenderness.  Neurological: He is alert.  Oriented to self and place.  Able to follow simple commands. Moderate to severe dysarthria with poor insight, poor awareness and impaired memory. Right upper grossly 3+ to 4-/5. RLE is 3- to 4/5 proximal to distally. He has decreased awareness and insight. Attention is limited as well. Fine motor coordination is impaired right greater than left.   Skin: Skin is warm and dry.  Multiple healing ulcers on RUE and RLE. Upper chest wall with fine rash.     Results for orders placed during the hospital encounter of 09/07/13 (from the past 24 hour(s))  CBC WITH DIFFERENTIAL     Status: None   Collection Time    09/07/13  5:12 PM      Result Value Range   WBC 7.1  4.0 - 10.5 K/uL   RBC 5.15  4.22 - 5.81 MIL/uL   Hemoglobin 14.6  13.0 - 17.0 g/dL   HCT 09.8  11.9 - 14.7 %   MCV 81.0  78.0 - 100.0 fL   MCH 28.3  26.0 - 34.0 pg   MCHC 35.0  30.0 - 36.0 g/dL   RDW 82.9  56.2 - 13.0 %   Platelets 179  150 - 400 K/uL   Neutrophils Relative % 65  43 - 77 %   Neutro Abs 4.6  1.7 - 7.7 K/uL   Lymphocytes Relative 26  12 - 46 %   Lymphs Abs 1.8  0.7 - 4.0 K/uL   Monocytes Relative 7  3 - 12 %   Monocytes Absolute 0.5  0.1 - 1.0 K/uL   Eosinophils Relative 3  0 - 5 %   Eosinophils Absolute 0.2  0.0 - 0.7 K/uL   Basophils Relative 0  0 - 1 %   Basophils Absolute 0.0  0.0 - 0.1 K/uL  COMPREHENSIVE METABOLIC PANEL     Status: Abnormal   Collection Time    09/07/13  5:12 PM      Result Value Range   Sodium 137  135 - 145 mEq/L   Potassium 4.0  3.5 - 5.1 mEq/L   Chloride 97  96 - 112 mEq/L   CO2 29  19 - 32 mEq/L   Glucose, Bld 256 (*) 70 - 99 mg/dL   BUN 19  6 - 23 mg/dL   Creatinine, Ser 8.65  0.50 - 1.35 mg/dL   Calcium 9.1  8.4 - 78.4 mg/dL  Total Protein 6.5  6.0 - 8.3 g/dL   Albumin 3.1 (*) 3.5 - 5.2 g/dL   AST 11  0 - 37 U/L   ALT 10  0 - 53 U/L   Alkaline Phosphatase 81  39 - 117 U/L   Total Bilirubin 0.2 (*) 0.3 - 1.2 mg/dL   GFR calc non Af Amer 56 (*) >90 mL/min   GFR calc Af Amer 65 (*) >90 mL/min  LIPASE,  BLOOD     Status: None   Collection Time    09/07/13  5:12 PM      Result Value Range   Lipase 26  11 - 59 U/L  POCT I-STAT TROPONIN I     Status: None   Collection Time    09/07/13  5:21 PM      Result Value Range   Troponin i, poc 0.00  0.00 - 0.08 ng/mL   Comment 3           CG4 I-STAT (LACTIC ACID)     Status: Abnormal   Collection Time    09/07/13  5:24 PM      Result Value Range   Lactic Acid, Venous 2.31 (*) 0.5 - 2.2 mmol/L  URINALYSIS, ROUTINE W REFLEX MICROSCOPIC     Status: Abnormal   Collection Time    09/07/13  6:21 PM      Result Value Range   Color, Urine YELLOW  YELLOW   APPearance CLOUDY (*) CLEAR   Specific Gravity, Urine 1.019  1.005 - 1.030   pH 7.5  5.0 - 8.0   Glucose, UA 500 (*) NEGATIVE mg/dL   Hgb urine dipstick SMALL (*) NEGATIVE   Bilirubin Urine NEGATIVE  NEGATIVE   Ketones, ur NEGATIVE  NEGATIVE mg/dL   Protein, ur >161 (*) NEGATIVE mg/dL   Urobilinogen, UA 0.2  0.0 - 1.0 mg/dL   Nitrite NEGATIVE  NEGATIVE   Leukocytes, UA SMALL (*) NEGATIVE  URINE MICROSCOPIC-ADD ON     Status: None   Collection Time    09/07/13  6:21 PM      Result Value Range   Squamous Epithelial / LPF RARE  RARE   WBC, UA TOO NUMEROUS TO COUNT  <3 WBC/hpf   RBC / HPF 0-2  <3 RBC/hpf   Urine-Other NO BACTERIA SEEN    GLUCOSE, CAPILLARY     Status: Abnormal   Collection Time    09/08/13 12:34 AM      Result Value Range   Glucose-Capillary 166 (*) 70 - 99 mg/dL  TROPONIN I     Status: None   Collection Time    09/08/13  1:46 AM      Result Value Range   Troponin I <0.30  <0.30 ng/mL  COMPREHENSIVE METABOLIC PANEL     Status: Abnormal   Collection Time    09/08/13  1:46 AM      Result Value Range   Sodium 135  135 - 145 mEq/L   Potassium 3.9  3.5 - 5.1 mEq/L   Chloride 99  96 - 112 mEq/L   CO2 25  19 - 32 mEq/L   Glucose, Bld 175 (*) 70 - 99 mg/dL   BUN 14  6 - 23 mg/dL   Creatinine, Ser 0.96  0.50 - 1.35 mg/dL   Calcium 8.6  8.4 - 04.5 mg/dL   Total Protein  6.3  6.0 - 8.3 g/dL   Albumin 3.0 (*) 3.5 - 5.2 g/dL   AST 20  0 -  37 U/L   ALT 9  0 - 53 U/L   Alkaline Phosphatase 78  39 - 117 U/L   Total Bilirubin 0.3  0.3 - 1.2 mg/dL   GFR calc non Af Amer 81 (*) >90 mL/min   GFR calc Af Amer >90  >90 mL/min  CBC     Status: None   Collection Time    09/08/13  1:46 AM      Result Value Range   WBC 9.0  4.0 - 10.5 K/uL   RBC 5.31  4.22 - 5.81 MIL/uL   Hemoglobin 15.3  13.0 - 17.0 g/dL   HCT 14.7  82.9 - 56.2 %   MCV 81.2  78.0 - 100.0 fL   MCH 28.8  26.0 - 34.0 pg   MCHC 35.5  30.0 - 36.0 g/dL   RDW 13.0  86.5 - 78.4 %   Platelets 175  150 - 400 K/uL  PROTIME-INR     Status: None   Collection Time    09/08/13  1:46 AM      Result Value Range   Prothrombin Time 12.5  11.6 - 15.2 seconds   INR 0.95  0.00 - 1.49  GLUCOSE, CAPILLARY     Status: Abnormal   Collection Time    09/08/13  7:59 AM      Result Value Range   Glucose-Capillary 184 (*) 70 - 99 mg/dL  TROPONIN I     Status: None   Collection Time    09/08/13  8:00 AM      Result Value Range   Troponin I <0.30  <0.30 ng/mL  TSH     Status: Abnormal   Collection Time    09/08/13  8:36 AM      Result Value Range   TSH 4.502 (*) 0.350 - 4.500 uIU/mL  GLUCOSE, CAPILLARY     Status: Abnormal   Collection Time    09/08/13 11:46 AM      Result Value Range   Glucose-Capillary 146 (*) 70 - 99 mg/dL  TROPONIN I     Status: None   Collection Time    09/08/13 12:14 PM      Result Value Range   Troponin I <0.30  <0.30 ng/mL   Dg Chest 2 View  09/07/2013   CLINICAL DATA:  Chest pain.  Cancer.  Diabetes.  EXAM: CHEST  2 VIEW  COMPARISON:  10/25/2012.  FINDINGS: Cardiopericardial silhouette within normal limits. Mediastinal contours normal. Trachea midline. No airspace disease or effusion. CABG. Stable appearance of broken median sternotomy wires.  IMPRESSION: No interval change or active cardiopulmonary disease.   Electronically Signed   By: Andreas Newport M.D.   On: 09/07/2013 18:08    US Abdomen Complete  09/08/2013   CLINICAL DATA:  Gallstones and abdominal pain.  EXAM: ULTRASOUND ABDOMEN COMPLETE  COMPARISON:  None.  FINDINGS: Gallbladder:  Multiple stones are noted within the gallbladder.  Common bile duct:  Diameter: 6.8 mm  Liver:  No focal lesion identified. Within normal limits in parenchymal echogenicity.  IVC:  No abnormality visualized.  Pancreas:  Visualized portion unremarkable.  Spleen:  Size and appearance within normal limits.  Right Kidney:  Length: 10.6 cm. There is a cyst identified within the upper pole the right kidney measuring 3 x 2.2 x 2.5 cm. No mass or hydronephrosis identified. The recently described right renal pelvis calculus is not well seen on today's study.  Left Kidney:  Length: 11.7 cm. The left renal cyst measures  2.2 x 2.3 x 1.9 cm. Echogenicity within normal limits. No mass or hydronephrosis visualized.  Abdominal aorta:  Not visualized.  Other findings:  None.  IMPRESSION: 1. No acute findings. 2. Gallstones. 3. Bilateral renal cysts.   Electronically Signed   By: Signa Kell M.D.   On: 09/08/2013 11:17   Ct Abdomen Pelvis W Contrast  09/07/2013   CLINICAL DATA:  Abdominal pain.  EXAM: CT ABDOMEN AND PELVIS WITH CONTRAST  TECHNIQUE: Multidetector CT imaging of the abdomen and pelvis was performed using the standard protocol following bolus administration of intravenous contrast.  CONTRAST:  OMNIPAQUE IOHEXOL 300 MG/ML  SOLN  COMPARISON:  08/25/2013.  FINDINGS: Lung Bases: Dependent atelectasis.  CABG.  Liver:  Normal.  Spleen:  Normal.  Gallbladder:  Cholelithiasis.  Common bile duct:  No common duct stone identified.  Pancreas:  Normal.  Adrenal glands: Unchanged right adrenal low-attenuation lesion likely representing adenoma.  Kidneys: Multiple bilateral renal cysts. 1 cm calculus in the right renal pelvis. Right ureter appears normal. Left ureter is also normal.  Stomach:  Normal.  Small bowel:  Normal.  Colon: Ileocolonic anastomosis in  the right lower quadrant. No complicating features. Moderate stool burden.  Pelvic Genitourinary: Thickening and enhancement of the urinary bladder suggesting cystitis. Mild trabeculation over the bladder dome. Prostatomegaly.  Bones:  No aggressive osseous lesion.  Vasculature: Atherosclerosis.  No acute vascular abnormality.  Body Wall: Normal.  IMPRESSION: 1. Mural enhancement and thickening of the urinary bladder wall compatible with cystitis. 2. Uncomplicated right lower quadrant ileocolonic anastomosis. 3. Cholelithiasis.  No CT findings of acute cholecystitis. 4. Nonobstructing right renal pelvis calculus. Bilateral renal cysts. 5. Low-attenuation lesion in the right adrenal likely represents adenoma.   Electronically Signed   By: Andreas Newport M.D.   On: 09/07/2013 22:10   Dg Foot Complete Right  09/08/2013   CLINICAL DATA:  Right foot and ankle pain.  EXAM: RIGHT FOOT COMPLETE - 3+ VIEW  COMPARISON:  None.  FINDINGS: There is no evidence of fracture or dislocation. The joint spaces are preserved. There is no evidence of talar subluxation; the subtalar joint is unremarkable in appearance.  Relatively diffuse vascular calcifications are noted.  IMPRESSION: 1. No evidence of fracture or dislocation. 2. Relatively diffuse vascular calcifications seen.   Electronically Signed   By: Roanna Raider M.D.   On: 09/08/2013 00:15    Assessment/Plan: Diagnosis: deconditioning, gait disorder. Admitted for sepsis. Has had balance problems since his CVA but has declined further surrounding the time of this admission 1. Does the need for close, 24 hr/day medical supervision in concert with the patient's rehab needs make it unreasonable for this patient to be served in a less intensive setting? Yes 2. Co-Morbidities requiring supervision/potential complications: dm, htn, urine retention 3. Due to bladder management, bowel management, safety, skin/wound care, disease management, medication administration, pain  management and patient education, does the patient require 24 hr/day rehab nursing? Yes 4. Does the patient require coordinated care of a physician, rehab nurse, PT (1-2 hrs/day, 5 days/week) and OT (1-2 hrs/day, 5 days/week) to address physical and functional deficits in the context of the above medical diagnosis(es)? Yes Addressing deficits in the following areas: balance, endurance, locomotion, strength, transferring, bowel/bladder control, bathing, dressing, feeding, grooming, toileting and psychosocial support 5. Can the patient actively participate in an intensive therapy program of at least 3 hrs of therapy per day at least 5 days per week? Yes 6. The potential for patient to make measurable  gains while on inpatient rehab is good 7. Anticipated functional outcomes upon discharge from inpatient rehab are supervision with PT, supervision with OT, n'a with SLP. 8. Estimated rehab length of stay to reach the above functional goals is: 8-12 days 9. Does the patient have adequate social supports to accommodate these discharge functional goals? Yes 10. Anticipated D/C setting: Home 11. Anticipated post D/C treatments: HH therapy 12. Overall Rehab/Functional Prognosis: excellent  RECOMMENDATIONS: This patient's condition is appropriate for continued rehabilitative care in the following setting: CIR Patient has agreed to participate in recommended program. Yes Note that insurance prior authorization may be required for reimbursement for recommended care.  Comment: Rehab RN to follow up.   Ranelle Oyster, MD, [place] [facility]     09/08/2013

## 2013-09-08 NOTE — Evaluation (Addendum)
Occupational Therapy Evaluation Patient Details Name: [name] MRN: [id number] DOB: [birth year] Today's Date: 09/08/2013 Time: 1191-4782 OT Time Calculation (min): 15 min  OT Assessment / Plan / Recommendation History of present illness [name] is a 72 y.o. male with past medical history of CVA, expressive aphasia office CVA, COPD, hypertension, CAD, dyslipidemia. He presented with complaints of pain all over and possible chest pain. He was diagnosed with a UTI and was admitted.  He was also diagnosed with UTI recently and was on Keflex.    Clinical Impression   Pt admitted with above.  Session limited due to arrival of transport to take pt to ultrasound.  Per daughter report, pt has h/o falls at home and requires assist with ADLs.  Daughter also states that she is concerned he is getting weaker and will require more assist upon return home. Recommend CIR to further maximize independence and safety with ADLs in prep for eventual return home with family. Will continue to follow acutely in order to address below problem list.     OT Assessment  Patient needs continued OT Services    Follow Up Recommendations  CIR;Supervision/Assistance - 24 hour    Barriers to Discharge      Equipment Recommendations   (TBD next venue)    Recommendations for Other Services Rehab consult  Frequency  Min 2X/week    Precautions / Restrictions Precautions Precautions: Fall   Pertinent Vitals/Pain See vitals    ADL  Grooming: Performed;Wash/dry face;Minimal assistance Where Assessed - Grooming: Unsupported sitting Lower Body Dressing: Performed;+1 Total assistance (donn socks) Where Assessed - Lower Body Dressing: Supported sitting ADL Comments: Pt restless in bed upon OT arrival, attempting to kick legs OOB. Pt sat EOB ~2 minutes for grooming task. ADL assessment limited due to arrival of transport to take pt to ultrasound.  Pt became labile about halfway through session.      OT  Diagnosis: Generalized weakness;Paresis  OT Problem List: Decreased activity tolerance;Impaired balance (sitting and/or standing);Decreased knowledge of use of DME or AE;Decreased knowledge of precautions;Decreased strength OT Treatment Interventions: Self-care/ADL training;DME and/or AE instruction;Therapeutic activities;Patient/family education;Balance training   OT Goals(Current goals can be found in the care plan section) Acute Rehab OT Goals Patient Stated Goal: unable to state OT Goal Formulation: With family Time For Goal Achievement: 09/22/13 Potential to Achieve Goals: Good  Visit Information  Last OT Received On: 09/08/13 Assistance Needed: +1 (bed level) History of Present Illness: [name] is a 72 y.o. male with past medical history of CVA, expressive aphasia office CVA, COPD, hypertension, CAD, dyslipidemia. He presented with complaints of pain all over and possible chest pain. He was diagnosed with a UTI and was admitted.  He was also diagnosed with UTI recently and was on Keflex.        Prior Functioning     Home Living Family/patient expects to be discharged to:: Private residence Living Arrangements: Spouse/significant other;Children Available Help at Discharge: Family;Available 24 hours/day Type of Home: House Home Access: Ramped entrance Home Layout: One level Home Equipment: Bedside commode;Wheelchair - manual (rollator) Additional Comments: Daughter arrived at end of session and reported that pt does have a w/c but he will not use it.  States he has a rollator with a seat that he will often use.  Daughter reports that pt has h/o falls at home, states his "legs will just give out at times."   Prior Function Level of Independence: Needs assistance Gait / Transfers Assistance Needed: Pt  ambulates wiht rollator.  Daughter states that someone is usually with him when he is walking and that she has had to catch him from falling multiple times.  ADL's /  Homemaking Assistance Needed: Assist for bathing/dressing. Communication Communication: Expressive difficulties Dominant Hand: Right         Vision/Perception Vision - Assessment Additional Comments: Unable to assess due to arrival of transport to take pt.  Will assess further next session.   Cognition  Cognition Arousal/Alertness: Awake/alert Behavior During Therapy: Restless Overall Cognitive Status: Difficult to assess Difficult to assess due to: Impaired communication    Extremity/Trunk Assessment Upper Extremity Assessment Upper Extremity Assessment: RUE deficits/detail RUE Deficits / Details: H/o right side weakness. 2+/5 - 3/5. Difficult to assess due to pt becoming labile with daughter's arrival.    Mobility Bed Mobility Bed Mobility: Left Sidelying to Sit;Sitting - Scoot to Edge of Bed;Sit to Supine Left Sidelying to Sit: 3: Mod assist Sitting - Scoot to Edge of Bed: 4: Min assist Sit to Supine: 3: Mod assist Details for Bed Mobility Assistance: Assist to support trunk and LEs OOB.  Use of draw pad to assist pt with scooting EOB.     Exercise     Balance Balance Balance Assessed: Yes Static Sitting Balance Static Sitting - Balance Support: Feet supported Static Sitting - Level of Assistance: 4: Min assist Static Sitting - Comment/# of Minutes: 2 minutes EOB   End of Session OT - End of Session Activity Tolerance:  (Session limited by arrival of transport to take pt to test) Patient left: in bed;with family/visitor present  GO Functional Assessment Tool Used: clinical judgement Functional Limitation: Self care Self Care Current Status (N8295): At least 20 percent but less than 40 percent impaired, limited or restricted Self Care Goal Status (A2130): At least 1 percent but less than 20 percent impaired, limited or restricted  09/08/2013 Cipriano Mile OTR/L Pager [phone] Office [phone]  Cipriano Mile 09/08/2013, 10:54 AM

## 2013-09-08 NOTE — Progress Notes (Addendum)
[facility] PROGRESS NOTE  Ronald Londo Haskell AVW:[id number] DOB: September 22, 1940 DOA: 09/07/2013  PCP: Delorse Lek, MD  Brief HPI: GURINDER TORAL is a 72 y.o. male with past medical history of CVA, expressive aphasia office CVA, COPD, hypertension, CAD, dyslipidemia. He presented with complaints of pain all over and possible chest pain. He was diagnosed with a UTI and was admitted.  He was also diagnosed with UTI recently and was on Keflex.   Past medical history:  Past Medical History  Diagnosis Date  . Myocardial infarction   . CVA (cerebral infarction)   . Diabetes mellitus   . Hypertension   . Diabetes mellitus   . Aphasia   . Dysphagia   . COPD (chronic obstructive pulmonary disease)   . Shortness of breath   . Anemia   . Stroke   . Colon cancer   . Hemorrhoid   . Intractable seizures   . Pseudobulbar affect   . Dyslipidemia   . CAD (coronary artery disease)   . History of hemorrhoids     Consultants: None  Procedures: None  Antibiotics: Ceftriaxone 12/21-->  Subjective: Patient complaints of abdominal pain and also complaints of chest pain. It is difficult to elicit history from him due to his expressive aphasia. Denies nausea or vomiting. Asking for something to eat.  Objective: Vital Signs  Filed Vitals:   09/07/13 2330 09/08/13 0050 09/08/13 0457 09/08/13 0807  BP: 167/92 188/76 160/80 141/71  Pulse: 76 67 68 68  Temp:  98.3 F (36.8 C) 98.5 F (36.9 C) 98.9 F (37.2 C)  TempSrc:  Oral Oral Oral  Resp:  18 18 18   Height:  5\' 4"  (1.626 m)    Weight:  71.413 kg (157 lb 7 oz)    SpO2: 98% 95% 96% 93%    Intake/Output Summary (Last 24 hours) at 09/08/13 0819 Last data filed at 09/08/13 0459  Gross per 24 hour  Intake      0 ml  Output    250 ml  Net   -250 ml   Filed Weights   09/08/13 0050  Weight: 71.413 kg (157 lb 7 oz)    General appearance: alert, cooperative and no distress Resp: clear to auscultation bilaterally Cardio:  regular rate and rhythm, S1, S2 normal, no murmur, click, rub or gallop GI: Soft, tender mostly in suprapubic area. Minimal tenderness in RUQ.Marland Kitchen No rebound rigidity or guarding. No masses or organomegaly. BS present. Extremities: extremities normal, atraumatic, no cyanosis or edema Neurologic: Expressive aphasia. No other focal deficits.  Lab Results:  Basic Metabolic Panel:  Recent Labs Lab 09/07/13 1712 09/08/13 0146  NA 137 135  K 4.0 3.9  CL 97 99  CO2 29 25  GLUCOSE 256* 175*  BUN 19 14  CREATININE 1.25 0.96  CALCIUM 9.1 8.6   Liver Function Tests:  Recent Labs Lab 09/07/13 1712 09/08/13 0146  AST 11 20  ALT 10 9  ALKPHOS 81 78  BILITOT 0.2* 0.3  PROT 6.5 6.3  ALBUMIN 3.1* 3.0*    Recent Labs Lab 09/07/13 1712  LIPASE 26   CBC:  Recent Labs Lab 09/07/13 1712 09/08/13 0146  WBC 7.1 9.0  NEUTROABS 4.6  --   HGB 14.6 15.3  HCT 41.7 43.1  MCV 81.0 81.2  PLT 179 175   Cardiac Enzymes:  Recent Labs Lab 09/07/13 0146 09/08/13 0146  CKTOTAL 45  --   TROPONINI  --  <0.30   CBG:  Recent Labs Lab 09/03/13 1056  09/06/13 1229 09/08/13 0034 09/08/13 0759  GLUCAP 211* 251* 166* 184*    Recent Results (from the past 240 hour(s))  URINE CULTURE     Status: None   Collection Time    09/06/13 12:33 PM      Result Value Range Status   Specimen Description URINE, CLEAN CATCH   Final   Special Requests none Normal   Final   Culture  Setup Time     Final   Value: 09/06/2013 21:11     Performed at [facility] Count     Final   Value: 60,000 COLONIES/ML     Performed at [facility]   Culture     Final   Value: Multiple bacterial morphotypes present, none predominant. Suggest appropriate recollection if clinically indicated.     Performed at [facility]   Report Status 09/07/2013 FINAL   Final      Studies/Results: Dg Chest 2 View  09/07/2013   CLINICAL DATA:  Chest pain.  Cancer.  Diabetes.  EXAM: CHEST  2  VIEW  COMPARISON:  10/25/2012.  FINDINGS: Cardiopericardial silhouette within normal limits. Mediastinal contours normal. Trachea midline. No airspace disease or effusion. CABG. Stable appearance of broken median sternotomy wires.  IMPRESSION: No interval change or active cardiopulmonary disease.   Electronically Signed   By: Andreas Newport M.D.   On: 09/07/2013 18:08   Ct Abdomen Pelvis W Contrast  09/07/2013   CLINICAL DATA:  Abdominal pain.  EXAM: CT ABDOMEN AND PELVIS WITH CONTRAST  TECHNIQUE: Multidetector CT imaging of the abdomen and pelvis was performed using the standard protocol following bolus administration of intravenous contrast.  CONTRAST:  OMNIPAQUE IOHEXOL 300 MG/ML  SOLN  COMPARISON:  08/25/2013.  FINDINGS: Lung Bases: Dependent atelectasis.  CABG.  Liver:  Normal.  Spleen:  Normal.  Gallbladder:  Cholelithiasis.  Common bile duct:  No common duct stone identified.  Pancreas:  Normal.  Adrenal glands: Unchanged right adrenal low-attenuation lesion likely representing adenoma.  Kidneys: Multiple bilateral renal cysts. 1 cm calculus in the right renal pelvis. Right ureter appears normal. Left ureter is also normal.  Stomach:  Normal.  Small bowel:  Normal.  Colon: Ileocolonic anastomosis in the right lower quadrant. No complicating features. Moderate stool burden.  Pelvic Genitourinary: Thickening and enhancement of the urinary bladder suggesting cystitis. Mild trabeculation over the bladder dome. Prostatomegaly.  Bones:  No aggressive osseous lesion.  Vasculature: Atherosclerosis.  No acute vascular abnormality.  Body Wall: Normal.  IMPRESSION: 1. Mural enhancement and thickening of the urinary bladder wall compatible with cystitis. 2. Uncomplicated right lower quadrant ileocolonic anastomosis. 3. Cholelithiasis.  No CT findings of acute cholecystitis. 4. Nonobstructing right renal pelvis calculus. Bilateral renal cysts. 5. Low-attenuation lesion in the right adrenal likely represents  adenoma.   Electronically Signed   By: Andreas Newport M.D.   On: 09/07/2013 22:10   Dg Abd 2 Views  09/06/2013   CLINICAL DATA:  Abdominal pain and constipation.  EXAM: ABDOMEN - 2 VIEW  COMPARISON:  Abdominal and pelvic CT scan of August 25, 2013.  FINDINGS: The bowel gas pattern is within the limits of normal. I cannot exclude constipation. A dense rounded calcification is consistent with a known 1.2 cm stone in the right renal pelvis. There are phleboliths within the pelvis. There seminal vesicle calcification is well in the pelvis. Vascular arterial calcifications also are demonstrated. There is surgical suture material in the right mid abdomen. The bony  structures exhibit no acute abnormalities.  IMPRESSION: 1. The bowel gas pattern may indicate constipation. There is no evidence of ileus or obstruction. 2. There is a 1.2 cm diameter stone on the right which likely corresponds to the known stone in the right renal pelvis. The known gallstones are not clearly evident. Sign rib   Electronically Signed   By: David  [place]   On: 09/06/2013 13:13   Dg Foot Complete Right  09/08/2013   CLINICAL DATA:  Right foot and ankle pain.  EXAM: RIGHT FOOT COMPLETE - 3+ VIEW  COMPARISON:  None.  FINDINGS: There is no evidence of fracture or dislocation. The joint spaces are preserved. There is no evidence of talar subluxation; the subtalar joint is unremarkable in appearance.  Relatively diffuse vascular calcifications are noted.  IMPRESSION: 1. No evidence of fracture or dislocation. 2. Relatively diffuse vascular calcifications seen.   Electronically Signed   By: Roanna Raider M.D.   On: 09/08/2013 00:15    Medications:  Scheduled: . aspirin EC  81 mg Oral Daily  . cefTRIAXone (ROCEPHIN) IVPB 1 gram/50 mL D5W  1 g Intravenous Q24H  . clopidogrel  75 mg Oral Q breakfast  . escitalopram  10 mg Oral Daily  . finasteride  5 mg Oral Daily  . heparin  5,000 Units Subcutaneous Q8H  . insulin aspart  0-15 Units  Subcutaneous TID WC  . insulin aspart  0-5 Units Subcutaneous QHS  . levETIRAcetam  500 mg Oral Q12H  . magnesium hydroxide  15 mL Oral Once  . polyethylene glycol  17 g Oral BID  . sodium chloride  3 mL Intravenous Q12H  . topiramate  25 mg Oral BID   Continuous: . sodium chloride 75 mL/hr at 09/08/13 0101   NWG:[id number], acetaminophen, albuterol, hydrALAZINE, HYDROcodone-acetaminophen, nitroGLYCERIN, ondansetron (ZOFRAN) IV, ondansetron  Assessment/Plan:  Principal Problem:   Urinary tract infection Active Problems:   CVA (cerebral infarction)   DM (diabetes mellitus), type 2, uncontrolled   HTN (hypertension), benign   Expressive aphasia   Adrenal adenoma   UTI (lower urinary tract infection)    Urinary tract infection  Continue ceftriaxone. Urine cultures are pending. Recent cultures did not grow any specific organism.   Abdominal Pain Most likely due to above but he was also noted to have gallstones and constipation. Since history has been difficult to obtain we will proceed with US abdomen to better characterize the gallbladder. LFT's however are normal. Will resume diet if [place] is unremarkable. Other findings on CT including kidney stones, adrenal adenoma, real cysts can be pursued as OP.  Chest pain  He has known CAD with h/o CABG and stent to LCX in 2013. Trop is normal. Had a stress test recently which did not show reversible ischemia. Rule out GB as possible source. Unlikely to be PE.   History if CAD As above. Continue Aspirin and Plavix.  History of Diabetes mellitus  On SSI. Monitor.   Constipation  Continue MiraLax and Amitiza. Check TSH.  History of Seizure Disorder Continue Keppra  History of HTN BP elevated. Hydralazine as needed. Resume ARB as creatinine is better.  Code Status: Full Code  DVT Prophylaxis: Heparin    Family Communication: Discussed with patient. Will attempt to reach his wife.  Disposition Plan: PT/OT evaluation. Anticipate  he will go back home when improved.    LOS: 1 day   [facility]  [facility] Pager [phone] 09/08/2013, 8:19 AM  If 8PM-8AM, please contact night-coverage at [email or web address], password  [email or web address]

## 2013-09-08 NOTE — Evaluation (Signed)
Clinical/Bedside Swallow Evaluation Patient Details  Name: [name] MRN: [id number] Date of Birth: [birth year]  Today's Date: 09/08/2013 Time: 1550-1610 SLP Time Calculation (min): 20 min  Past Medical History:  Past Medical History  Diagnosis Date  . Myocardial infarction   . CVA (cerebral infarction)   . Diabetes mellitus   . Hypertension   . Diabetes mellitus   . Aphasia   . Dysphagia   . COPD (chronic obstructive pulmonary disease)   . Shortness of breath   . Anemia   . Stroke   . Colon cancer   . Hemorrhoid   . Intractable seizures   . Pseudobulbar affect   . Dyslipidemia   . CAD (coronary artery disease)   . History of hemorrhoids    Past Surgical History:  Past Surgical History  Procedure Laterality Date  . Coronary artery bypass graft    . Appendectomy    . Dental surgery    . Colonoscopy  03/13/2012    Procedure: COLONOSCOPY;  Surgeon: Hart Carwin, MD;  Location: [facility];  Service: Endoscopy;  Laterality: N/A;  . Partial colectomy  03/22/2012    Procedure: PARTIAL COLECTOMY;  Surgeon: Cherylynn Ridges, MD;  Location: [facility] OR;  Service: General;  Laterality: Right;  . Coronary artery bypass graft      x3  . Coronary stent placement     HPI:  [name] is a 72 y.o. male with past medical history of CVA, expressive aphasia s/p CVA, COPD, hypertension, CAD, dyslipidemia. He presented with complaints of pain all over and possible chest pain. He was diagnosed with a UTI and was admitted.  He was also diagnosed with UTI recently and was on Keflex.     Assessment / Plan / Recommendation Clinical Impression  Pt with a h/o dysphagia s/p previous CVA presents with immediate and delayed coughing with thin liquids. No overt s/s of aspiration were observed with nectar thick liquids or solid PO, although he did appear to have difficulty masticating a graham cracker due to his edentulous state. Recommend to downgrade diet to Dys 2 textures to facilitate  mastication/comfort and nectar thick liquids to decrease aspiration risk.     Aspiration Risk  Moderate    Diet Recommendation Dysphagia 2 (Fine chop);Nectar-thick liquid   Liquid Administration via: Cup;Straw;No straw Medication Administration: Whole meds with puree Supervision: Patient able to self feed;Full supervision/cueing for compensatory strategies Compensations: Slow rate;Small sips/bites Postural Changes and/or Swallow Maneuvers: Upright 30-60 min after meal    Other  Recommendations Oral Care Recommendations: Oral care BID   Follow Up Recommendations  Other (comment) (TBD - suspect pt is at his baseline given history, however will need to clarify with family)    Frequency and Duration min 2x/week  1 week   Pertinent Vitals/Pain N/A    SLP Swallow Goals  Please refer to Care Plan.   Swallow Study Prior Functional Status  Type of Home: House Available Help at Discharge: Family;Available 24 hours/day    General Date of Onset:  (chronic) HPI: [name] is a 71 y.o. male with past medical history of CVA, expressive aphasia s/p CVA, COPD, hypertension, CAD, dyslipidemia. He presented with complaints of pain all over and possible chest pain. He was diagnosed with a UTI and was admitted.  He was also diagnosed with UTI recently and was on Keflex.   Type of Study: Bedside swallow evaluation Previous Swallow Assessment: most recent MBS in chart 01/31/12 with recommendations for Dys 2 and  nectar thick liquids Diet Prior to this Study: Regular;Thin liquids Temperature Spikes Noted: No Respiratory Status: Room air History of Recent Intubation: No Behavior/Cognition: Alert;Requires cueing;Doesn't follow directions;Pleasant mood Oral Cavity - Dentition: Edentulous Self-Feeding Abilities: Able to feed self Patient Positioning: Upright in bed Baseline Vocal Quality: Clear Volitional Cough: Cognitively unable to elicit Volitional Swallow: Unable to elicit     Oral/Motor/Sensory Function Overall Oral Motor/Sensory Function: Impaired at baseline (limited assessment due to aphasia) Labial ROM: Reduced right;Reduced left Labial Strength: Within Functional Limits Lingual ROM: Reduced right;Reduced left (reduced protrusion) Mandible: Within Functional Limits   Ice Chips Ice chips: Not tested   Thin Liquid Thin Liquid: Impaired Presentation: Cup;Self Fed;Straw Pharyngeal  Phase Impairments: Suspected delayed Swallow;Cough - Immediate;Cough - Delayed    Nectar Thick Nectar Thick Liquid: Impaired Presentation: Cup;Self Fed Pharyngeal Phase Impairments: Suspected delayed Swallow   Honey Thick Honey Thick Liquid: Not tested   Puree Puree: Impaired Presentation: Self Fed;Spoon Pharyngeal Phase Impairments: Suspected delayed Swallow   Solid   GO Functional Assessment Tool Used: skilled clinical judgment Functional Limitations: Swallowing Swallow Current Status (G2952): At least 60 percent but less than 80 percent impaired, limited or restricted Swallow Goal Status [phone]): At least 80 percent but less than 100 percent impaired, limited or restricted  Solid: Impaired Presentation: Self Fed Oral Phase Functional Implications: Other (comment) (prolonged mastication and facial grimacing, suspect discomfort due to edentulous state) Pharyngeal Phase Impairments: Suspected delayed Swallow         Maxcine Ham, M.A. CCC-SLP [phone]  Maxcine Ham 09/08/2013,4:27 PM

## 2013-09-08 NOTE — Evaluation (Signed)
Physical Therapy Evaluation Patient Details Name: [name] MRN: [id number] DOB: November 06, 1940 Today's Date: 09/08/2013 Time: 0102-7253 PT Time Calculation (min): 21 min  PT Assessment / Plan / Recommendation History of Present Illness  [name] is a 72 y.o. male with past medical history of CVA, expressive aphasia office CVA, COPD, hypertension, CAD, dyslipidemia. He presented with complaints of pain all over and possible chest pain. He was diagnosed with a UTI and was admitted.  He was also diagnosed with UTI recently and was on Keflex.   Clinical Impression  Pt adm with above. Presents with limitations in independence with functional mobility and has had recent decline and multiple falls per daughter. Pt to benefit from skilled Pt to maximize functional mobility and address deficits listed below in order to reduce falls and caregiver burden at home. Recommend CIR to further maximize functional independence prior to returning home with family.    PT Assessment  Patient needs continued PT services    Follow Up Recommendations  CIR    Does the patient have the potential to tolerate intense rehabilitation      Barriers to Discharge Decreased caregiver support pt with multiple falls at home; daughter reporting that they are having a difficult time caring for pt     Equipment Recommendations  Other (comment) (TBD)    Recommendations for Other Services Rehab consult   Frequency Min 3X/week    Precautions / Restrictions Precautions Precautions: Fall Precaution Comments: daughter reports multiple falls at home Restrictions Weight Bearing Restrictions: No   Pertinent Vitals/Pain See vitals.       Mobility  Bed Mobility Bed Mobility: Supine to Sit;Sitting - Scoot to Edge of Bed Supine to Sit: 3: Mod assist;HOB elevated;With rails Sitting - Scoot to Edge of Bed: 4: Min assist;With rail Details for Bed Mobility Assistance: use of draw pad to bring hips to EOB and to  bring trunk to upright sitting position  Transfers Transfers: Sit to Stand;Stand to Sit Sit to Stand: 4: Min assist;From bed;With upper extremity assist Stand to Sit: 4: Min assist;To chair/3-in-1;With armrests;With upper extremity assist Details for Transfer Assistance: cues for hand placement and safety; min (A) to maintain balance; pt bracing LEs against bed with transfers  Ambulation/Gait Ambulation/Gait Assistance: 4: Min assist Ambulation Distance (Feet): 4 Feet Assistive device: 1 person hand held assist Ambulation/Gait Assistance Details: pt unsteady with ambulating; ambulates with narrow BOS and short shuffled gt; tends to lean to Rt today with ambulating; attempted to sit prior to reaching chair; decr safety awareness  Gait Pattern: Decreased stride length;Narrow base of support;Shuffle Gait velocity: decreased  Stairs: No Wheelchair Mobility Wheelchair Mobility: No         PT Diagnosis: Abnormality of gait;Generalized weakness  PT Problem List: Decreased strength;Decreased activity tolerance;Decreased balance;Decreased mobility;Decreased cognition;Decreased knowledge of use of DME;Decreased safety awareness PT Treatment Interventions: DME instruction;Gait training;Functional mobility training;Therapeutic activities;Therapeutic exercise;Balance training;Neuromuscular re-education;Patient/family education     PT Goals(Current goals can be found in the care plan section) Acute Rehab PT Goals Patient Stated Goal: unable to state PT Goal Formulation: With patient/family Time For Goal Achievement: 09/22/13 Potential to Achieve Goals: Fair  Visit Information  Last PT Received On: 09/08/13 Assistance Needed: +1 History of Present Illness: [name] is a 72 y.o. male with past medical history of CVA, expressive aphasia office CVA, COPD, hypertension, CAD, dyslipidemia. He presented with complaints of pain all over and possible chest pain. He was diagnosed with a UTI and  was admitted.  He was also diagnosed with UTI recently and was on Keflex.        Prior Functioning  Home Living Family/patient expects to be discharged to:: Private residence Living Arrangements: Spouse/significant other;Children Available Help at Discharge: Family;Available 24 hours/day Type of Home: House Home Access: Ramped entrance Home Layout: One level Home Equipment: Bedside commode;Wheelchair - manual Additional Comments: Daughter arrived at end of session and reported that pt does have a w/c but he will not use it.  States he has a rollator with a seat that he will often use.  Daughter reports that pt has h/o falls at home, states his "legs will just give out at times."   Prior Function Level of Independence: Needs assistance Gait / Transfers Assistance Needed: Pt ambulates wiht rollator.  Daughter states that someone is usually with him when he is walking and that she has had to catch him from faling multiple times.  ADL's / Homemaking Assistance Needed: Assist for bathing/dressing. Communication Communication: Expressive difficulties Dominant Hand: Right    Cognition  Cognition Arousal/Alertness: Awake/alert Behavior During Therapy: Restless;Impulsive Overall Cognitive Status: Difficult to assess Difficult to assess due to: Impaired communication    Extremity/Trunk Assessment Upper Extremity Assessment Upper Extremity Assessment: Defer to OT evaluation Lower Extremity Assessment Lower Extremity Assessment: Generalized weakness;RLE deficits/detail RLE Deficits / Details: H/O Rt sided weakness from previous CVA  RLE Sensation:  (daughter reports pt has pain in LEs constantly) Cervical / Trunk Assessment Cervical / Trunk Assessment: Kyphotic   Balance Balance Balance Assessed: Yes Static Sitting Balance Static Sitting - Balance Support: Feet supported Static Sitting - Level of Assistance: 4: Min assist Static Sitting - Comment/# of Minutes: requires (A) to get feet  supported on floor; tolerated sitting ~5 min prior to transfer  Static Standing Balance Static Standing - Balance Support: Right upper extremity supported;During functional activity Static Standing - Level of Assistance: 4: Min assist Static Standing - Comment/# of Minutes: tolerated standing ~1 min with (A) while nursing tech performs perineal hygiene   End of Session PT - End of Session Equipment Utilized During Treatment: Gait belt Activity Tolerance: Patient tolerated treatment well Patient left: in chair;with call bell/phone within reach Nurse Communication: Mobility status;Precautions  GP Functional Assessment Tool Used: clinical judgement  Functional Limitation: Mobility: Walking and moving around Mobility: Walking and Moving Around Current Status (W0981): At least 20 percent but less than 40 percent impaired, limited or restricted Mobility: Walking and Moving Around Goal Status [phone]): At least 1 percent but less than 20 percent impaired, limited or restricted   Donell Sievert, [place] [phone] 09/08/2013, 2:18 PM

## 2013-09-08 NOTE — Progress Notes (Signed)
New Admission Note: late entry   Arrival Method: Via stretcher from the ED with EMT  Mental Orientation: Patient seems to be alert and oriented x4. Difficulty understanding his speech and gestures.  Telemetry: Box 04 running NSR Assessment: Completed Skin: Warm, dry and intact. Scabs on left leg and lower back that are healed over.  IV: Left AC, NS@ 61mL/hr Pain: No complaints of pain  Tubes: None Safety Measures: Safety Fall Prevention Plan has been given, discussed. Not signed due to patient being right handed and he has right sided weakness from an old CVA Admission: Completed 6 [place] Orientation: Patient has been orientated to the room, unit and staff.  Family: No family at bedside   Orders have been reviewed and implemented. Will continue to monitor the patient. Call light has been placed within reach and bed alarm has been activated.   [employer] BSN, RN  Phone number: [phone]

## 2013-09-09 DIAGNOSIS — R569 Unspecified convulsions: Secondary | ICD-10-CM

## 2013-09-09 DIAGNOSIS — R5381 Other malaise: Secondary | ICD-10-CM

## 2013-09-09 DIAGNOSIS — R269 Unspecified abnormalities of gait and mobility: Secondary | ICD-10-CM

## 2013-09-09 DIAGNOSIS — I10 Essential (primary) hypertension: Secondary | ICD-10-CM

## 2013-09-09 LAB — COMPREHENSIVE METABOLIC PANEL
AST: 12 U/L (ref 0–37)
Albumin: 2.9 g/dL — ABNORMAL LOW (ref 3.5–5.2)
CO2: 22 mEq/L (ref 19–32)
Calcium: 8.7 mg/dL (ref 8.4–10.5)
Creatinine, Ser: 1.21 mg/dL (ref 0.50–1.35)
GFR calc non Af Amer: 58 mL/min — ABNORMAL LOW (ref >90)
Sodium: 138 mEq/L (ref 135–145)
Total Bilirubin: 0.3 mg/dL (ref 0.3–1.2)
Total Protein: 6.1 g/dL (ref 6.0–8.3)

## 2013-09-09 LAB — CBC
MCH: 27.8 pg (ref 26.0–34.0)
MCHC: 34.3 g/dL (ref 30.0–36.0)
MCV: 81 fL (ref 78.0–100.0)
Platelets: 153 10*3/uL (ref 150–400)
RDW: 13.3 % (ref 11.5–15.5)

## 2013-09-09 LAB — GLUCOSE, CAPILLARY
Glucose-Capillary: 84 mg/dL (ref 70–99)
Glucose-Capillary: 165 mg/dL — ABNORMAL HIGH (ref 70–99)
Glucose-Capillary: 149 mg/dL — ABNORMAL HIGH (ref 70–99)
Glucose-Capillary: 134 mg/dL — ABNORMAL HIGH (ref 70–99)
Glucose-Capillary: 120 mg/dL — ABNORMAL HIGH (ref 70–99)

## 2013-09-09 LAB — URINE CULTURE

## 2013-09-09 MED ORDER — POLYETHYLENE GLYCOL 3350 17 G PO PACK
17.0000 g | PACK | Freq: Every day | ORAL | Status: DC | PRN
  Filled 2013-09-09: qty 1

## 2013-09-09 NOTE — Progress Notes (Signed)
Clinical Social Work Department CLINICAL SOCIAL WORK PLACEMENT NOTE 09/09/2013  Patient:  [name], [name]  Account Number:  [id number] Admit date:  09/07/2013  Clinical Social Worker:  Maryclare Labrador, Theresia Majors  Date/time:  09/09/2013 04:11 PM  Clinical Social Work is seeking post-discharge placement for this patient at the following level of care:   SKILLED NURSING   (*CSW will update this form in Epic as items are completed)   N/A-clinicals faxed to all 22 facilities in [employer] provided with [employer] [facility] Department of Clinical Social Work's list of facilities offering this level of care within the geographic area requested by the patient (or if unable, by the patient's family).  09/09/13  Patient/family informed of their freedom to choose among providers that offer the needed level of care, that participate in Medicare, Medicaid or managed care program needed by the patient, have an available bed and are willing to accept the patient.  N/A-clinicals not sent to this facility  Patient/family informed of [facility]' ownership interest in [facility], as well as of the fact that they are under no obligation to receive care at this facility.  PASARR submitted to EDS on EXISTING PASARR number received from EDS on EXISTING  FL2 transmitted to all facilities in geographic area requested by pt/family on  09/09/2013 FL2 transmitted to all facilities within larger geographic area on   Patient informed that his/her managed care company has contracts with or will negotiate with  certain facilities, including the following:     Patient/family informed of bed offers received:  09/09/2013 Patient chooses bed at [facility] Physician recommends and patient chooses bed at    Patient to be transferred to [facility] on   Patient to be transferred to facility by   The following physician request were entered in Epic:   Additional Comments:   Maryclare Labrador,  MSW, [facility] Clinical Social Worker [phone]

## 2013-09-09 NOTE — Progress Notes (Signed)
[facility] PROGRESS NOTE  [name] AOZ:[id number] DOB: January 13, 1941 DOA: 09/07/2013  PCP: Delorse Lek, MD  Brief HPI: [name] is a 72 y.o. male with past medical history of CVA, expressive aphasia office CVA, COPD, hypertension, CAD, dyslipidemia. He presented with complaints of pain all over and possible chest pain. He was diagnosed with a UTI and was admitted.  He was also diagnosed with UTI recently and was on Keflex.   Past medical history:  Past Medical History  Diagnosis Date  . Myocardial infarction   . CVA (cerebral infarction)   . Diabetes mellitus   . Hypertension   . Diabetes mellitus   . Aphasia   . Dysphagia   . COPD (chronic obstructive pulmonary disease)   . Shortness of breath   . Anemia   . Stroke   . Colon cancer   . Hemorrhoid   . Intractable seizures   . Pseudobulbar affect   . Dyslipidemia   . CAD (coronary artery disease)   . History of hemorrhoids     Consultants: None  Procedures: None  Antibiotics: Ceftriaxone 12/21-->  Subjective: Patient was agitated last night and had to be moved closer to nursing station.  Apparently had loose stools yesterday. Continues to have some abdominal pain without nausea or vomiting. Limited history due to expressive aphasia.  Objective: Vital Signs  Filed Vitals:   09/08/13 1355 09/08/13 1719 09/08/13 2046 09/09/13 0618  BP: 138/76 147/81 145/61 154/54  Pulse: 72 72 67 56  Temp: 98.7 F (37.1 C) 97.4 F (36.3 C) 98.5 F (36.9 C) 98.8 F (37.1 C)  TempSrc: Oral Oral Oral Oral  Resp: 20 20 19 19   Height:   5\' 4"  (1.626 m)   Weight:   70.761 kg (156 lb)   SpO2: 97% 98% 98% 98%    Intake/Output Summary (Last 24 hours) at 09/09/13 0749 Last data filed at 09/08/13 1900  Gross per 24 hour  Intake      0 ml  Output      0 ml  Net      0 ml   Filed Weights   09/08/13 0050 09/08/13 2046  Weight: 71.413 kg (157 lb 7 oz) 70.761 kg (156 lb)    General appearance: alert,  cooperative and no distress Resp: clear to auscultation bilaterally Cardio: regular rate and rhythm, S1, S2 normal, no murmur, click, rub or gallop GI: Soft, Diffusely tender but mild and more so in suprapubic area. No rebound rigidity or guarding. No masses or organomegaly. BS present. Extremities: extremities normal, atraumatic, no cyanosis or edema Neurologic: Expressive aphasia. No other focal deficits.  Lab Results:  Basic Metabolic Panel:  Recent Labs Lab 09/07/13 1712 09/08/13 0146  NA 137 135  K 4.0 3.9  CL 97 99  CO2 29 25  GLUCOSE 256* 175*  BUN 19 14  CREATININE 1.25 0.96  CALCIUM 9.1 8.6   Liver Function Tests:  Recent Labs Lab 09/07/13 1712 09/08/13 0146  AST 11 20  ALT 10 9  ALKPHOS 81 78  BILITOT 0.2* 0.3  PROT 6.5 6.3  ALBUMIN 3.1* 3.0*    Recent Labs Lab 09/07/13 1712  LIPASE 26   CBC:  Recent Labs Lab 09/07/13 1712 09/08/13 0146 09/09/13 0646  WBC 7.1 9.0 5.0  NEUTROABS 4.6  --   --   HGB 14.6 15.3 14.0  HCT 41.7 43.1 40.8  MCV 81.0 81.2 81.0  PLT 179 175 153   Cardiac Enzymes:  Recent Labs  Lab 09/07/13 0146 09/08/13 0146 09/08/13 0800 09/08/13 1214  CKTOTAL 45  --   --   --   TROPONINI  --  <0.30 <0.30 <0.30   CBG:  Recent Labs Lab 09/08/13 0759 09/08/13 1146 09/08/13 1810 09/08/13 2043 09/09/13 0734  GLUCAP 184* 146* 170* 84 165*    Recent Results (from the past 240 hour(s))  URINE CULTURE     Status: None   Collection Time    09/06/13 12:33 PM      Result Value Range Status   Specimen Description URINE, CLEAN CATCH   Final   Special Requests none Normal   Final   Culture  Setup Time     Final   Value: 09/06/2013 21:11     Performed at [facility] Count     Final   Value: 60,000 COLONIES/ML     Performed at [facility]   Culture     Final   Value: Multiple bacterial morphotypes present, none predominant. Suggest appropriate recollection if clinically indicated.     Performed  at [facility]   Report Status 09/07/2013 FINAL   Final  URINE CULTURE     Status: None   Collection Time    09/07/13  6:21 PM      Result Value Range Status   Specimen Description URINE, CLEAN CATCH   Final   Special Requests NONE   Final   Culture  Setup Time     Final   Value: 09/07/2013 19:03     Performed at [facility] Count PENDING   Incomplete   Culture     Final   Value: Culture reincubated for better growth     Performed at [facility]   Report Status PENDING   Incomplete      Studies/Results: Dg Chest 2 View  09/07/2013   CLINICAL DATA:  Chest pain.  Cancer.  Diabetes.  EXAM: CHEST  2 VIEW  COMPARISON:  10/25/2012.  FINDINGS: Cardiopericardial silhouette within normal limits. Mediastinal contours normal. Trachea midline. No airspace disease or effusion. CABG. Stable appearance of broken median sternotomy wires.  IMPRESSION: No interval change or active cardiopulmonary disease.   Electronically Signed   By: Andreas Newport M.D.   On: 09/07/2013 18:08   US Abdomen Complete  09/08/2013   CLINICAL DATA:  Gallstones and abdominal pain.  EXAM: ULTRASOUND ABDOMEN COMPLETE  COMPARISON:  None.  FINDINGS: Gallbladder:  Multiple stones are noted within the gallbladder.  Common bile duct:  Diameter: 6.8 mm  Liver:  No focal lesion identified. Within normal limits in parenchymal echogenicity.  IVC:  No abnormality visualized.  Pancreas:  Visualized portion unremarkable.  Spleen:  Size and appearance within normal limits.  Right Kidney:  Length: 10.6 cm. There is a cyst identified within the upper pole the right kidney measuring 3 x 2.2 x 2.5 cm. No mass or hydronephrosis identified. The recently described right renal pelvis calculus is not well seen on today's study.  Left Kidney:  Length: 11.7 cm. The left renal cyst measures 2.2 x 2.3 x 1.9 cm. Echogenicity within normal limits. No mass or hydronephrosis visualized.  Abdominal aorta:  Not visualized.   Other findings:  None.  IMPRESSION: 1. No acute findings. 2. Gallstones. 3. Bilateral renal cysts.   Electronically Signed   By: Signa Kell M.D.   On: 09/08/2013 11:17   Ct Abdomen Pelvis W Contrast  09/07/2013   CLINICAL DATA:  Abdominal pain.  EXAM: CT ABDOMEN AND PELVIS WITH CONTRAST  TECHNIQUE: Multidetector CT imaging of the abdomen and pelvis was performed using the standard protocol following bolus administration of intravenous contrast.  CONTRAST:  OMNIPAQUE IOHEXOL 300 MG/ML  SOLN  COMPARISON:  08/25/2013.  FINDINGS: Lung Bases: Dependent atelectasis.  CABG.  Liver:  Normal.  Spleen:  Normal.  Gallbladder:  Cholelithiasis.  Common bile duct:  No common duct stone identified.  Pancreas:  Normal.  Adrenal glands: Unchanged right adrenal low-attenuation lesion likely representing adenoma.  Kidneys: Multiple bilateral renal cysts. 1 cm calculus in the right renal pelvis. Right ureter appears normal. Left ureter is also normal.  Stomach:  Normal.  Small bowel:  Normal.  Colon: Ileocolonic anastomosis in the right lower quadrant. No complicating features. Moderate stool burden.  Pelvic Genitourinary: Thickening and enhancement of the urinary bladder suggesting cystitis. Mild trabeculation over the bladder dome. Prostatomegaly.  Bones:  No aggressive osseous lesion.  Vasculature: Atherosclerosis.  No acute vascular abnormality.  Body Wall: Normal.  IMPRESSION: 1. Mural enhancement and thickening of the urinary bladder wall compatible with cystitis. 2. Uncomplicated right lower quadrant ileocolonic anastomosis. 3. Cholelithiasis.  No CT findings of acute cholecystitis. 4. Nonobstructing right renal pelvis calculus. Bilateral renal cysts. 5. Low-attenuation lesion in the right adrenal likely represents adenoma.   Electronically Signed   By: Andreas Newport M.D.   On: 09/07/2013 22:10   Dg Foot Complete Right  09/08/2013   CLINICAL DATA:  Right foot and ankle pain.  EXAM: RIGHT FOOT COMPLETE - 3+  VIEW  COMPARISON:  None.  FINDINGS: There is no evidence of fracture or dislocation. The joint spaces are preserved. There is no evidence of talar subluxation; the subtalar joint is unremarkable in appearance.  Relatively diffuse vascular calcifications are noted.  IMPRESSION: 1. No evidence of fracture or dislocation. 2. Relatively diffuse vascular calcifications seen.   Electronically Signed   By: Roanna Raider M.D.   On: 09/08/2013 00:15    Medications:  Scheduled: . aspirin EC  81 mg Oral Daily  . cefTRIAXone (ROCEPHIN) IVPB 1 gram/50 mL D5W  1 g Intravenous Q24H  . clopidogrel  75 mg Oral Q breakfast  . escitalopram  10 mg Oral Daily  . finasteride  5 mg Oral Daily  . heparin  5,000 Units Subcutaneous Q8H  . insulin aspart  0-15 Units Subcutaneous TID WC  . insulin aspart  0-5 Units Subcutaneous QHS  . irbesartan  150 mg Oral Daily  . levETIRAcetam  500 mg Oral Q12H  . lubiprostone  8 mcg Oral BID WC  . polyethylene glycol  17 g Oral BID  . sodium chloride  3 mL Intravenous Q12H  . topiramate  25 mg Oral BID   Continuous:   ZOX:[id number], acetaminophen, albuterol, food thickener, hydrALAZINE, HYDROcodone-acetaminophen, nitroGLYCERIN, ondansetron (ZOFRAN) IV, ondansetron  Assessment/Plan:  Principal Problem:   Urinary tract infection Active Problems:   CVA (cerebral infarction)   DM (diabetes mellitus), type 2, uncontrolled   HTN (hypertension), benign   Expressive aphasia   Adrenal adenoma   UTI (lower urinary tract infection)    Urinary tract infection  Continue ceftriaxone. Urine cultures are pending. Recent cultures did not grow any specific organism.   Loose Stools Most likely due to laxatives and stool softeners initiated for constipation noted on CT. Will decrease dose. Do not suspect infectious etiology.  Mild Agitation Most likely due to sundowning. Monitor for now. Reorient frequently.  Abdominal Pain Most likely due to  UTI. [place] did not suggest  cholecystitis. Gallstones were noted. LFT's however are normal. Other findings on CT including kidney stones, adrenal adenoma, real cysts can be pursued as OP.  Chest pain  Doesn't appear to be in any discomfort. He has known CAD with h/o CABG and stent to LCX in 2013. Trop is normal. Had a stress test recently which did not show reversible ischemia. Unlikely to be PE.   History if CAD As above. Continue Aspirin and Plavix.  History of Diabetes mellitus  On SSI. Monitor.   Constipation  Continue MiraLax and Amitiza. TSH 4.5.  History of Seizure Disorder Continue Keppra  History of HTN BP better. Hydralazine as needed. Continue ARB.  Code Status: Full Code  DVT Prophylaxis: Heparin    Family Communication: Discussed with patient. Tried calling wife Kathie Rhodes with no response. Will attempt later.  Disposition Plan: PT/OT evaluation completed. CIR consult pending.     LOS: 2 days   [facility]  [facility] Pager [phone] 09/09/2013, 7:49 AM  If 8PM-8AM, please contact night-coverage at [email or web address], password [facility]

## 2013-09-09 NOTE — Progress Notes (Signed)
Clinical Social Work Department BRIEF PSYCHOSOCIAL ASSESSMENT 09/09/2013  Patient:  [name], [name]     Account Number:  [id number]     Admit date:  09/07/2013  Clinical Social Worker:  Varney Biles  Date/Time:  09/09/2013 04:07 PM  Referred by:  Physician  Date Referred:  09/09/2013 Referred for  SNF Placement   Other Referral:   Interview type:  Family Other interview type:   CSW also called by RN with CIR admissions.    PSYCHOSOCIAL DATA Living Status:  FAMILY Admitted from facility:   Level of care:   Primary support name:  [name] [phone]) Primary support relationship to patient:  SPOUSE Degree of support available:   Good--pt spouse and daughter involved in care.    CURRENT CONCERNS Current Concerns  Post-Acute Placement   Other Concerns:    SOCIAL WORK ASSESSMENT / PLAN CSW received call from RN with CIR, as pt's daughter refusing to send pt to CIR. Instead, daughter wants SNF. CSW called daughter and explained the need to choose a facility today because pt has [facility] and this process can take a long time for SNF auth. Daughter understanding of this. CSW provided SNF options and daughter chose [facility]. Facility alerted and they are working on [employer] now.   Assessment/plan status:  Psychosocial Support/Ongoing Assessment of Needs Other assessment/ plan:   Information/referral to community resources:   SNF [facility]).    PATIENT'S/FAMILY'S RESPONSE TO PLAN OF CARE: Pt's daughter receptive to CSW phone call and assistance and chose [facility] as it is closest to her home and she can visit pt every day. CSW will facilitate discharge once SNF gets insurance authorization.       [name], MSW, [facility] Clinical Social Worker [phone]

## 2013-09-09 NOTE — Progress Notes (Signed)
Speech Language Pathology Treatment: Dysphagia  Patient Details Name: [name] MRN: [id number] DOB: May 04, 1941 Today's Date: 09/09/2013 Time: 1110-1140 SLP Time Calculation (min): 30 min  Assessment / Plan / Recommendation Clinical Impression  Pt. Appears to be tolerating current diet of Dys 2 (chopped foods) and Nectar thick liquids.  Positive choking noted with thin liquids.  Baseline aphasia, with relatively good receptive language, but moderate expressive aphasia.  Pt with perseveration, and intermittent emotional lability (crying).   HPI HPI: [name] is a 72 y.o. male with past medical history of CVA, expressive aphasia s/p CVA, COPD, hypertension, CAD, dyslipidemia. He presented with complaints of pain all over and possible chest pain. He was diagnosed with a UTI and was admitted.  He was also diagnosed with UTI recently and was on Keflex.     Pertinent Vitals Lungs sounds clear; CXR negative; pt afebrile.  SLP Plan  Continue with current plan of care    Recommendations Diet recommendations: Dysphagia 2 (fine chop);Nectar-thick liquid Liquids provided via: Cup Medication Administration: Whole meds with puree Supervision: Patient able to self feed;Full supervision/cueing for compensatory strategies Compensations: Slow rate;Small sips/bites Postural Changes and/or Swallow Maneuvers: Seated upright 90 degrees              General recommendations: Rehab consult Oral Care Recommendations: Oral care BID Follow up Recommendations: 24 hour supervision/assistance Plan: Continue with current plan of care    GO     Maryjo Rochester T 09/09/2013, 11:41 AM

## 2013-09-09 NOTE — Progress Notes (Signed)
Physical Therapy Treatment Patient Details Name: [name] MRN: [id number] DOB: [birth year] Today's Date: 09/09/2013 Time: 4098-1191 PT Time Calculation (min): 16 min  PT Assessment / Plan / Recommendation  History of Present Illness [name] is a 72 y.o. male with past medical history of CVA, expressive aphasia office CVA, COPD, hypertension, CAD, dyslipidemia. He presented with complaints of pain all over and possible chest pain. He was diagnosed with a UTI and was admitted.  He was also diagnosed with UTI recently and was on Keflex.    PT Comments   Pt continues to demo decreased safety awareness with gt and mobility. Requires handheld (A) to brace and stabilize with ambulation. Pt seems unaware that he becomes off balance at times; especially with high level balance activities. Pt continues to be a great candidate for CIR. Pt needs to increase independence with mobility to reduce caregiver burden and reduce risk of falls.   Follow Up Recommendations  CIR     Does the patient have the potential to tolerate intense rehabilitation     Barriers to Discharge        Equipment Recommendations  Other (comment)    Recommendations for Other Services    Frequency Min 3X/week   Progress towards PT Goals Progress towards PT goals: Progressing toward goals  Plan Current plan remains appropriate    Precautions / Restrictions Precautions Precautions: Fall Precaution Comments: daughter reports multiple falls at home Restrictions Weight Bearing Restrictions: No   Pertinent Vitals/Pain No complaints. See vitals.     Mobility  Bed Mobility Bed Mobility: Not assessed Details for Bed Mobility Assistance: pt sitting in chair and returned to chair  Transfers Transfers: Sit to Stand;Stand to Sit Sit to Stand: 4: Min guard;From chair/3-in-1 Stand to Sit: 4: Min guard;To chair/3-in-1;With armrests Details for Transfer Assistance: cues for hand placement and technique   Ambulation/Gait Ambulation/Gait Assistance: 4: Min guard Ambulation Distance (Feet): 150 Feet Assistive device: 1 person hand held assist Ambulation/Gait Assistance Details: pt requires handheld (A) to stablize; when challenged with high level balance activities pt is unsteady and at times staggers with his gt; pt seems unaware that he is off balance and requires (A); demo decr safety awareness with gt  Gait Pattern: Decreased stride length;Narrow base of support;Shuffle Gait velocity: decreased  Stairs: No Wheelchair Mobility Wheelchair Mobility: No    Exercises General Exercises - Lower Extremity Long Arc Quad: AROM;Both;10 reps;Seated Hip Flexion/Marching: AROM;Both;10 reps;Seated   PT Diagnosis:    PT Problem List:   PT Treatment Interventions:     PT Goals (current goals can now be found in the care plan section) Acute Rehab PT Goals Patient Stated Goal: unable to state PT Goal Formulation: With patient/family Time For Goal Achievement: 09/22/13 Potential to Achieve Goals: Fair  Visit Information  Last PT Received On: 09/09/13 Assistance Needed: +1 History of Present Illness: [name] is a 72 y.o. male with past medical history of CVA, expressive aphasia office CVA, COPD, hypertension, CAD, dyslipidemia. He presented with complaints of pain all over and possible chest pain. He was diagnosed with a UTI and was admitted.  He was also diagnosed with UTI recently and was on Keflex.     Subjective Data  Subjective: pt sitting in chair eating breakfast; pt eager to get up and ambulate  Patient Stated Goal: unable to state   Cognition  Cognition Arousal/Alertness: Awake/alert Behavior During Therapy: Restless;Impulsive Overall Cognitive Status: Difficult to assess Difficult to assess due to: Impaired communication  Balance  Balance Balance Assessed: Yes High Level Balance High Level Balance Activites: Head turns;Direction changes High Level Balance Comments:  pt with decreased ability to turn head with ambulating; has to come to complete stop; staggers at times and seems to not notice with gt; requires hand hedl (A) to manage   End of Session PT - End of Session Equipment Utilized During Treatment: Gait belt Activity Tolerance: Patient tolerated treatment well Patient left: in chair;with call bell/phone within reach Nurse Communication: Mobility status;Precautions   GP Functional Assessment Tool Used: clinical judgement  Functional Limitation: Mobility: Walking and moving around Mobility: Walking and Moving Around Current Status (Z6109): At least 1 percent but less than 20 percent impaired, limited or restricted Mobility: Walking and Moving Around Goal Status [phone]): At least 1 percent but less than 20 percent impaired, limited or restricted   Donell Sievert, [place] [phone] 09/09/2013, 9:15 AM

## 2013-09-09 NOTE — Progress Notes (Signed)
Rehab admissions - Evaluated for possible admission.  I spoke with patient's wife and with patient's daughter.  Daughter prefers SNF placement for patient.  I have notified the social worker of the need for SNF.  Call me for questions.  #[phone]

## 2013-09-09 NOTE — ED Provider Notes (Signed)
I saw and evaluated the patient, reviewed the resident's note and I agree with the findings and plan.  EKG Interpretation    Date/Time:  Sunday September 07 2013 20:44:12 EST Ventricular Rate:  78 PR Interval:  132 QRS Duration: 88 QT Interval:  385 QTC Calculation: 438 R Axis:   -49 Text Interpretation:  Sinus rhythm Left anterior fascicular block Abnormal R-wave progression, late transition Confirmed by Neala Miggins  MD, Brodee Mauritz ([id number]) on 09/07/2013 10:52:06 PM             Mario Gladson T Latissa Frick, MD 09/09/13 2005 

## 2013-09-10 DIAGNOSIS — E119 Type 2 diabetes mellitus without complications: Secondary | ICD-10-CM

## 2013-09-10 DIAGNOSIS — I10 Essential (primary) hypertension: Secondary | ICD-10-CM

## 2013-09-10 DIAGNOSIS — D649 Anemia, unspecified: Secondary | ICD-10-CM

## 2013-09-10 DIAGNOSIS — E875 Hyperkalemia: Secondary | ICD-10-CM

## 2013-09-10 DIAGNOSIS — R4182 Altered mental status, unspecified: Secondary | ICD-10-CM

## 2013-09-10 LAB — COMPREHENSIVE METABOLIC PANEL
Albumin: 2.8 g/dL — ABNORMAL LOW (ref 3.5–5.2)
Alkaline Phosphatase: 70 U/L (ref 39–117)
BUN: 26 mg/dL — ABNORMAL HIGH (ref 6–23)
Chloride: 103 mEq/L (ref 96–112)
GFR calc non Af Amer: 51 mL/min — ABNORMAL LOW (ref >90)
Potassium: 4.1 mEq/L (ref 3.5–5.1)
Sodium: 137 mEq/L (ref 135–145)
Total Protein: 6 g/dL (ref 6.0–8.3)

## 2013-09-10 LAB — CBC
HCT: 40.3 % (ref 39.0–52.0)
MCHC: 35 g/dL (ref 30.0–36.0)
MCV: 82.6 fL (ref 78.0–100.0)
RDW: 13.5 % (ref 11.5–15.5)

## 2013-09-10 LAB — GLUCOSE, CAPILLARY: Glucose-Capillary: 76 mg/dL (ref 70–99)

## 2013-09-10 MED ORDER — LORAZEPAM 2 MG/ML IJ SOLN
1.0000 mg | Freq: Four times a day (QID) | INTRAMUSCULAR | Status: DC
  Filled 2013-09-10: qty 1

## 2013-09-10 MED ORDER — BIOTENE DRY MOUTH MT LIQD
15.0000 mL | Freq: Two times a day (BID) | OROMUCOSAL | Status: DC
  Administered 2013-09-10 – 2013-09-12 (×4): 15 mL via OROMUCOSAL

## 2013-09-10 MED ORDER — LORAZEPAM 2 MG/ML IJ SOLN
1.0000 mg | Freq: Four times a day (QID) | INTRAMUSCULAR | Status: DC | PRN
  Administered 2013-09-10: 1 mg via INTRAVENOUS

## 2013-09-10 NOTE — Progress Notes (Signed)
[facility] PROGRESS NOTE  [name] ZOX:[id number] DOB: [birth year] DOA: 09/07/2013 PCP: Delorse Lek, MD  Brief HPI: [name] is a 72 y.o. male with past medical history of CVA, expressive aphasia office CVA, COPD, hypertension, CAD, dyslipidemia. He presented with complaints of pain all over and possible chest pain. He was diagnosed with a UTI and was admitted. He was also diagnosed with UTI recently and was on Keflex.   Assessment/Plan:  Urinary tract infection  Continue ceftriaxone. Urine cultures are did not grow any specific organism. Patient has had 4 days of antibiotics, will consider discontinuing tomorrow  Loose Stools  Most likely due to laxatives and stool softeners initiated for constipation. - As the  Mild Agitation  Most likely due to sundowning. Monitor for now. Reorient frequently.   Abdominal Pain  - Most likely due to UTI. [place] did not suggest cholecystitis. Gallstones were noted. LFT's however are normal. - Resolved   Other findings on CT including kidney stones, adrenal adenoma, real cysts can be pursued as OP.   Chest pain  Doesn't appear to be in any discomfort. He has known CAD with h/o CABG and stent to LCX in 2013. Trop is normal.  - Had a stress test recently which did not show reversible ischemia. Unlikely to be PE.   History if CAD  As above.  - Continue Aspirin and Plavix.   History of Diabetes mellitus  On SSI. Monitor.   Constipation  Continue MiraLax and Amitiza. TSH 4.5.   History of Seizure Disorder  Continue Keppra   History of HTN  BP better. Hydralazine as needed. Continue ARB.   Code Status: Full Code  DVT Prophylaxis: Heparin  Family Communication: No family bedside Disposition Plan: PT/OT evaluation completed. CIR consult pending.    Consultants:  none  Procedures:  None  Antibiotics:  Rocephin  HPI/Subjective: Patient has no new complaints, no acute issues overnight.  Objective: Filed  Vitals:   09/10/13 1648  BP: 106/69  Pulse: 62  Temp: 98 F (36.7 C)  Resp: 18    Intake/Output Summary (Last 24 hours) at 09/10/13 1758 Last data filed at 09/10/13 1506  Gross per 24 hour  Intake    360 ml  Output    200 ml  Net    160 ml   Filed Weights   09/08/13 0050 09/08/13 2046 09/09/13 1953  Weight: 71.413 kg (157 lb 7 oz) 70.761 kg (156 lb) 71.215 kg (157 lb)    Exam:   General:  Pt in NAD, Alert and Awake  Cardiovascular: RRR, no MRG  Respiratory: CTA BL, no wheezes  Abdomen: soft, NT, ND  Musculoskeletal: no cyanosis or clubbing   Data Reviewed: Basic Metabolic Panel:  Recent Labs Lab 09/07/13 1712 09/08/13 0146 09/09/13 0646 09/10/13 0435  NA 137 135 138 137  K 4.0 3.9 3.9 4.1  CL 97 99 103 103  CO2 29 25 22 22   GLUCOSE 256* 175* 158* 139*  BUN 19 14 17  26*  CREATININE 1.25 0.96 1.21 1.36*  CALCIUM 9.1 8.6 8.7 8.8   Liver Function Tests:  Recent Labs Lab 09/07/13 1712 09/08/13 0146 09/09/13 0646 09/10/13 0435  AST 11 20 12 12   ALT 10 9 7 8   ALKPHOS 81 78 76 70  BILITOT 0.2* 0.3 0.3 0.3  PROT 6.5 6.3 6.1 6.0  ALBUMIN 3.1* 3.0* 2.9* 2.8*    Recent Labs Lab 09/07/13 1712  LIPASE 26   No results found for this basename: AMMONIA,  in the last 168 hours CBC:  Recent Labs Lab 09/07/13 1712 09/08/13 0146 09/09/13 0646 09/10/13 0435  WBC 7.1 9.0 5.0 6.6  NEUTROABS 4.6  --   --   --   HGB 14.6 15.3 14.0 14.1  HCT 41.7 43.1 40.8 40.3  MCV 81.0 81.2 81.0 82.6  PLT 179 175 153 140*   Cardiac Enzymes:  Recent Labs Lab 09/07/13 0146 09/08/13 0146 09/08/13 0800 09/08/13 1214  CKTOTAL 45  --   --   --   TROPONINI  --  <0.30 <0.30 <0.30   BNP (last 3 results) No results found for this basename: PROBNP,  in the last 8760 hours CBG:  Recent Labs Lab 09/09/13 1709 09/09/13 2057 09/10/13 0758 09/10/13 1156 09/10/13 1644  GLUCAP 134* 120* 143* 210* 76    Recent Results (from the past 240 hour(s))  URINE CULTURE      Status: None   Collection Time    09/06/13 12:33 PM      Result Value Range Status   Specimen Description URINE, CLEAN CATCH   Final   Special Requests none Normal   Final   Culture  Setup Time     Final   Value: 09/06/2013 21:11     Performed at [facility] Count     Final   Value: 60,000 COLONIES/ML     Performed at [facility]   Culture     Final   Value: Multiple bacterial morphotypes present, none predominant. Suggest appropriate recollection if clinically indicated.     Performed at [facility]   Report Status 09/07/2013 FINAL   Final  URINE CULTURE     Status: None   Collection Time    09/07/13  6:21 PM      Result Value Range Status   Specimen Description URINE, CLEAN CATCH   Final   Special Requests NONE   Final   Culture  Setup Time     Final   Value: 09/07/2013 19:03     Performed at [facility] Count     Final   Value: >=100,000 COLONIES/ML     Performed at [facility]   Culture     Final   Value: Multiple bacterial morphotypes present, none predominant. Suggest appropriate recollection if clinically indicated.     Performed at [facility]   Report Status 09/09/2013 FINAL   Final     Studies: No results found.  Scheduled Meds: . antiseptic oral rinse  15 mL Mouth Rinse BID AC & HS  . aspirin EC  81 mg Oral Daily  . cefTRIAXone (ROCEPHIN) IVPB 1 gram/50 mL D5W  1 g Intravenous Q24H  . clopidogrel  75 mg Oral Q breakfast  . escitalopram  10 mg Oral Daily  . finasteride  5 mg Oral Daily  . heparin  5,000 Units Subcutaneous Q8H  . insulin aspart  0-15 Units Subcutaneous TID WC  . insulin aspart  0-5 Units Subcutaneous QHS  . irbesartan  150 mg Oral Daily  . levETIRAcetam  500 mg Oral Q12H  . lubiprostone  8 mcg Oral BID WC  . sodium chloride  3 mL Intravenous Q12H  . topiramate  25 mg Oral BID   Continuous Infusions:   Principal Problem:   Urinary tract infection Active  Problems:   CVA (cerebral infarction)   DM (diabetes mellitus), type 2, uncontrolled   HTN (hypertension), benign   Expressive aphasia  Adrenal adenoma   UTI (lower urinary tract infection)    Time spent: > 35 minutes    Penny Pia  [facility] Pager [phone]. If 7PM-7AM, please contact night-coverage at [email or web address], password [facility] 09/10/2013, 5:58 PM  LOS: 3 days

## 2013-09-10 NOTE — Progress Notes (Signed)
0230 Patient combative to nursing staff (kicking and punching). Security paged for assistance. Dr. Izola Price paged. Orders given for Ativan 1mg   Prn x1. Medication effective. Patient brought to desk with staff for safety until 6 A.M.Marland Kitchenadm

## 2013-09-11 DIAGNOSIS — I6350 Cerebral infarction due to unspecified occlusion or stenosis of unspecified cerebral artery: Secondary | ICD-10-CM

## 2013-09-11 DIAGNOSIS — J449 Chronic obstructive pulmonary disease, unspecified: Secondary | ICD-10-CM

## 2013-09-11 DIAGNOSIS — R269 Unspecified abnormalities of gait and mobility: Secondary | ICD-10-CM

## 2013-09-11 LAB — GLUCOSE, CAPILLARY
Glucose-Capillary: 167 mg/dL — ABNORMAL HIGH (ref 70–99)
Glucose-Capillary: 140 mg/dL — ABNORMAL HIGH (ref 70–99)

## 2013-09-11 NOTE — Progress Notes (Signed)
[facility] PROGRESS NOTE  [name] ZOX:[id number] DOB: 01/20/1941 DOA: 09/07/2013 PCP: Delorse Lek, MD  Brief HPI: [name] is a 72 y.o. male with past medical history of CVA, expressive aphasia office CVA, COPD, hypertension, CAD, dyslipidemia. He presented with complaints of pain all over and possible chest pain. He was diagnosed with a UTI and was admitted. He was also diagnosed with UTI recently and was on Keflex.  Awaiting placement into SNF most likely 09/12/13  Assessment/Plan:  Urinary tract infection  Continue ceftriaxone. Urine cultures are did not grow any specific organism. Patient has had 4 days of antibiotics. He has no new complaints and as a result patient has had adequate therapy and IV rocephin will be discontinued  Loose Stools  Most likely due to laxatives and stool softeners initiated for constipation. - Resolved.  Mild Agitation  Most likely due to sundowning.  - Resolved  Abdominal Pain  - Most likely due to UTI. No new complaints - Resolved with IV rocephin - AST/ALT WNL  Other findings on CT including kidney stones, adrenal adenoma, real cysts can be pursued as OP.   Chest pain  Doesn't appear to be in any discomfort. He has known CAD with h/o CABG and stent to LCX in 2013. Trop is normal.  - Had a stress test recently which did not show reversible ischemia. Unlikely to be PE.   History if CAD  As above.  - Continue Aspirin and Plavix.   History of Diabetes mellitus  On SSI. Monitor.   Constipation  Continue MiraLax and Amitiza. TSH 4.5.   History of Seizure Disorder  Continue Keppra   History of HTN  BP better. Hydralazine as needed. Continue ARB.   Code Status: Full Code  DVT Prophylaxis: Heparin  Family Communication: No family bedside Disposition Plan: PT/OT evaluation completed. CIR consult pending.    Consultants:  none  Procedures:  None  Antibiotics:  Rocephin  HPI/Subjective: Patient has  no new complaints, no acute issues overnight.  Objective: Filed Vitals:   09/11/13 0910  BP: 139/69  Pulse: 59  Temp: 98.3 F (36.8 C)  Resp: 20    Intake/Output Summary (Last 24 hours) at 09/11/13 1200 Last data filed at 09/11/13 0900  Gross per 24 hour  Intake    663 ml  Output    250 ml  Net    413 ml   Filed Weights   09/08/13 2046 09/09/13 1953 09/10/13 2104  Weight: 70.761 kg (156 lb) 71.215 kg (157 lb) 71.305 kg (157 lb 3.2 oz)    Exam:   General:  Pt in NAD, Alert and Awake  Cardiovascular: RRR, no MRG  Respiratory: CTA BL, no wheezes  Abdomen: soft, NT, ND  Musculoskeletal: no cyanosis or clubbing   Data Reviewed: Basic Metabolic Panel:  Recent Labs Lab 09/07/13 1712 09/08/13 0146 09/09/13 0646 09/10/13 0435  NA 137 135 138 137  K 4.0 3.9 3.9 4.1  CL 97 99 103 103  CO2 29 25 22 22   GLUCOSE 256* 175* 158* 139*  BUN 19 14 17  26*  CREATININE 1.25 8.11 1.21 1.36*  CALCIUM 9.1 8.6 8.7 8.8   Liver Function Tests:  Recent Labs Lab 09/07/13 1712 09/08/13 0146 09/09/13 0646 09/10/13 0435  AST 11 20 12 12   ALT 10 9 7 8   ALKPHOS 81 78 76 70  BILITOT 0.2* 0.3 0.3 0.3  PROT 6.5 6.3 6.1 6.0  ALBUMIN 3.1* 3.0* 2.9* 2.8*    Recent Labs  Lab 09/07/13 1712  LIPASE 26   No results found for this basename: AMMONIA,  in the last 168 hours CBC:  Recent Labs Lab 09/07/13 1712 09/08/13 0146 09/09/13 0646 09/10/13 0435  WBC 7.1 9.0 5.0 6.6  NEUTROABS 4.6  --   --   --   HGB 14.6 15.3 14.0 14.1  HCT 41.7 43.1 40.8 40.3  MCV 81.0 81.2 81.0 82.6  PLT 179 175 153 140*   Cardiac Enzymes:  Recent Labs Lab 09/07/13 0146 09/08/13 0146 09/08/13 0800 09/08/13 1214  CKTOTAL 45  --   --   --   TROPONINI  --  <0.30 <0.30 <0.30   BNP (last 3 results) No results found for this basename: PROBNP,  in the last 8760 hours CBG:  Recent Labs Lab 09/10/13 0758 09/10/13 1156 09/10/13 1644 09/10/13 2108 09/11/13 0734  GLUCAP 143* 210* 76 164*  153*    Recent Results (from the past 240 hour(s))  URINE CULTURE     Status: None   Collection Time    09/06/13 12:33 PM      Result Value Range Status   Specimen Description URINE, CLEAN CATCH   Final   Special Requests none Normal   Final   Culture  Setup Time     Final   Value: 09/06/2013 21:11     Performed at [facility] Count     Final   Value: 60,000 COLONIES/ML     Performed at [facility]   Culture     Final   Value: Multiple bacterial morphotypes present, none predominant. Suggest appropriate recollection if clinically indicated.     Performed at [facility]   Report Status 09/07/2013 FINAL   Final  URINE CULTURE     Status: None   Collection Time    09/07/13  6:21 PM      Result Value Range Status   Specimen Description URINE, CLEAN CATCH   Final   Special Requests NONE   Final   Culture  Setup Time     Final   Value: 09/07/2013 19:03     Performed at [facility] Count     Final   Value: >=100,000 COLONIES/ML     Performed at [facility]   Culture     Final   Value: Multiple bacterial morphotypes present, none predominant. Suggest appropriate recollection if clinically indicated.     Performed at [facility]   Report Status 09/09/2013 FINAL   Final     Studies: No results found.  Scheduled Meds: . antiseptic oral rinse  15 mL Mouth Rinse BID AC & HS  . aspirin EC  81 mg Oral Daily  . cefTRIAXone (ROCEPHIN) IVPB 1 gram/50 mL D5W  1 g Intravenous Q24H  . clopidogrel  75 mg Oral Q breakfast  . escitalopram  10 mg Oral Daily  . finasteride  5 mg Oral Daily  . heparin  5,000 Units Subcutaneous Q8H  . insulin aspart  0-15 Units Subcutaneous TID WC  . insulin aspart  0-5 Units Subcutaneous QHS  . irbesartan  150 mg Oral Daily  . levETIRAcetam  500 mg Oral Q12H  . lubiprostone  8 mcg Oral BID WC  . sodium chloride  3 mL Intravenous Q12H  . topiramate  25 mg Oral BID   Continuous  Infusions:   Principal Problem:   Urinary tract infection Active Problems:   CVA (cerebral infarction)  DM (diabetes mellitus), type 2, uncontrolled   HTN (hypertension), benign   Expressive aphasia   Adrenal adenoma   UTI (lower urinary tract infection)    Time spent: > 35 minutes    Penny Pia  [facility] Pager [phone]. If 7PM-7AM, please contact night-coverage at [email or web address], password [facility] 09/11/2013, 12:00 PM  LOS: 4 days

## 2013-09-12 LAB — GLUCOSE, CAPILLARY: Glucose-Capillary: 200 mg/dL — ABNORMAL HIGH (ref 70–99)

## 2013-09-12 NOTE — Progress Notes (Addendum)
12:01pm: CSW and RN spoke to patient and patient's family about other options of going home with home health vs. Private paying for SNF. Patient and family were agreeable to Home with Home Health. CM aware.  Patient still does not insurance authorization to go to SNF. CSW attempted to call [facility] for insurance auth; their office is closed today.  CSW consulted MD.   Maree Krabbe, MSW, [facility]ors [phone]

## 2013-09-12 NOTE — Care Management Note (Signed)
   CARE MANAGEMENT NOTE 09/12/2013  Patient:  [name], [name]   Account Number:  [id number]  Date Initiated:  09/12/2013  Documentation initiated by:  Tregan Read  Subjective/Objective Assessment:   Pt for d/c to home today.     Action/Plan:   Pt will need HH services, wife has asked for [facility]. [facility] notified   Anticipated DC Date:  09/12/2013   Anticipated DC Plan:  HOME W HOME HEALTH SERVICES         Choice offered to / List presented to:  C-3 Spouse        HH arranged  HH-1 RN  HH-2 PT  HH-3 OT  HH-6 SOCIAL WORKER      HH agency  [facility]   Status of service:  Completed, signed off Medicare Important Message given?   (If response is "NO", the following Medicare IM given date fields will be blank) Date Medicare IM given:   Date Additional Medicare IM given:    Discharge Disposition:  HOME W HOME HEALTH SERVICES  Per UR Regulation:    If discussed at Long Length of Stay Meetings, dates discussed:    Comments:

## 2013-09-12 NOTE — Discharge Summary (Addendum)
Physician Discharge Summary  [name] AVW:[id number] DOB: [birth year] DOA: 09/07/2013  PCP: Delorse Lek, MD  Admit date: 09/07/2013 Discharge date: 09/12/2013  Time spent: > 35 minutes  Recommendations for Outpatient Follow-up:  1. Please be sure to follow up with your primary care physician at SNF 2. Continue to monitor S creatinine. 3. Addendum if serum creatinine > 1.5 stop metformin and consider another hypoglycemic agent. 4. Reassess serum creatinine in 1-3 days  Discharge Diagnoses:  Principal Problem:   Urinary tract infection Active Problems:   CVA (cerebral infarction)   DM (diabetes mellitus), type 2, uncontrolled   HTN (hypertension), benign   Expressive aphasia   Adrenal adenoma   UTI (lower urinary tract infection)   Discharge Condition: stable  Diet recommendation: Dysphagia 2 diet, low sodium heart healthy  Filed Weights   09/08/13 2046 09/09/13 1953 09/10/13 2104  Weight: 70.761 kg (156 lb) 71.215 kg (157 lb) 71.305 kg (157 lb 3.2 oz)    History of present illness:  Brief HPI: [name] is a 72 y.o. male with past medical history of CVA, expressive aphasia office CVA, COPD, hypertension, CAD, dyslipidemia. He presented with complaints of pain all over and possible chest pain. He was diagnosed with a UTI and was admitted. He was also diagnosed with UTI recently and was on Keflex.  Awaiting placement into SNF most likely 09/12/13  Hospital Course:   Urinary tract infection  Continue ceftriaxone. Urine cultures are did not grow any specific organism.  Patient has had 4 days of antibiotics. He has no new complaints and as a result patient has had adequate therapy and IV rocephin will be discontinued   Loose Stools  Most likely due to laxatives and stool softeners initiated for constipation.  - Resolved.   Mild Agitation  Most likely due to sundowning.  - Resolved   Abdominal Pain  - Most likely due to UTI.  - Resolved with IV  rocephin  - AST/ALT WNL   Other findings on CT including kidney stones, adrenal adenoma, real cysts can be pursued as OP.  Chest pain  Doesn't appear to be in any discomfort. He has known CAD with h/o CABG and stent to LCX in 2013. Trop is normal.  - Had a stress test recently which did not show reversible ischemia. Unlikely to be PE.   History if CAD  As above.  - Continue Aspirin and Plavix.   History of Diabetes mellitus  On SSI. Monitor.   Constipation  Continue MiraLax and Amitiza. TSH 4.5.   History of Seizure Disorder  Continue Keppra , no seizure like activity while in reported to me  History of HTN  Continue home regimen on discharge  Procedures:  None  Consultations:  None  Discharge Exam: Filed Vitals:   09/12/13 0655  BP: 144/76  Pulse: 59  Temp: 98 F (36.7 C)  Resp: 18    General: Pt in NAD, Alert and awake Cardiovascular: RRR, no MRG Respiratory: CTA BL, no wheezes  Discharge Instructions  Discharge Orders   Future Orders Complete By Expires   Diet - low sodium heart healthy  As directed    Increase activity slowly  As directed        Medication List    STOP taking these medications       HYDROcodone-acetaminophen 5-325 MG per tablet  Commonly known as:  NORCO/VICODIN      TAKE these medications       albuterol 108 (90 BASE) MCG/ACT  inhaler  Commonly known as:  PROVENTIL HFA;VENTOLIN HFA  Inhale 1-2 puffs into the lungs every 6 (six) hours as needed for wheezing.     aspirin EC 81 MG tablet  Take 81 mg by mouth daily. For CVA prophylaxis.     clopidogrel 75 MG tablet  Commonly known as:  PLAVIX  Take 75 mg by mouth daily.     escitalopram 10 MG tablet  Commonly known as:  LEXAPRO  Take 10 mg by mouth daily.     finasteride 5 MG tablet  Commonly known as:  PROSCAR  Take 5 mg by mouth daily.     levETIRAcetam 500 MG tablet  Commonly known as:  KEPPRA  Take 1 tablet (500 mg total) by mouth every 12 (twelve) hours.      lubiprostone 8 MCG capsule  Commonly known as:  AMITIZA  Take 1 capsule (8 mcg total) by mouth 2 (two) times daily with a meal.     multivitamin with minerals Tabs tablet  Take 1 tablet by mouth daily.     nitrofurantoin (macrocrystal-monohydrate) 100 MG capsule  Commonly known as:  MACROBID  Take 100 mg by mouth 2 (two) times daily.     nitroGLYCERIN 0.4 MG SL tablet  Commonly known as:  NITROSTAT  Place 0.4 mg under the tongue every 5 (five) minutes x 3 doses as needed. For chest pain     oxazepam 30 MG capsule  Commonly known as:  SERAX  Take 30 mg by mouth at bedtime.     polyethylene glycol packet  Commonly known as:  MIRALAX  Take 17 g by mouth 2 (two) times daily.     promethazine 25 MG tablet  Commonly known as:  PHENERGAN  Take 25 mg by mouth every 6 (six) hours as needed. For nausea.     sitaGLIPtin-metformin 50-1000 MG per tablet  Commonly known as:  JANUMET  Take 1 tablet by mouth 2 (two) times daily with a meal.     topiramate 25 MG tablet  Commonly known as:  TOPAMAX  Take 1 tablet (25 mg total) by mouth 2 (two) times daily.     valsartan 160 MG tablet  Commonly known as:  DIOVAN  Take 160 mg by mouth daily.     vitamin C 500 MG tablet  Commonly known as:  ASCORBIC ACID  Take 500 mg by mouth 2 (two) times daily.     zinc sulfate 220 MG capsule  Take 220 mg by mouth daily.       No Known Allergies    The results of significant diagnostics from this hospitalization (including imaging, microbiology, ancillary and laboratory) are listed below for reference.    Significant Diagnostic Studies: Dg Chest 2 View  09/07/2013   CLINICAL DATA:  Chest pain.  Cancer.  Diabetes.  EXAM: CHEST  2 VIEW  COMPARISON:  10/25/2012.  FINDINGS: Cardiopericardial silhouette within normal limits. Mediastinal contours normal. Trachea midline. No airspace disease or effusion. CABG. Stable appearance of broken median sternotomy wires.  IMPRESSION: No interval change or  active cardiopulmonary disease.   Electronically Signed   By: Andreas Newport M.D.   On: 09/07/2013 18:08   Dg Chest 2 View  08/25/2013   CLINICAL DATA:  Chest and abdominal pain for 2 weeks.  EXAM: CHEST  2 VIEW  COMPARISON:  Single view of the chest 10/25/2012.  FINDINGS: The patient is status post CABG. The lungs are clear. Heart size is normal. No pneumothorax or pleural  effusion.  IMPRESSION: No acute disease.   Electronically Signed   By: Drusilla Kanner M.D.   On: 08/25/2013 10:11   US Abdomen Complete  09/08/2013   CLINICAL DATA:  Gallstones and abdominal pain.  EXAM: ULTRASOUND ABDOMEN COMPLETE  COMPARISON:  None.  FINDINGS: Gallbladder:  Multiple stones are noted within the gallbladder.  Common bile duct:  Diameter: 6.8 mm  Liver:  No focal lesion identified. Within normal limits in parenchymal echogenicity.  IVC:  No abnormality visualized.  Pancreas:  Visualized portion unremarkable.  Spleen:  Size and appearance within normal limits.  Right Kidney:  Length: 10.6 cm. There is a cyst identified within the upper pole the right kidney measuring 3 x 2.2 x 2.5 cm. No mass or hydronephrosis identified. The recently described right renal pelvis calculus is not well seen on today's study.  Left Kidney:  Length: 11.7 cm. The left renal cyst measures 2.2 x 2.3 x 1.9 cm. Echogenicity within normal limits. No mass or hydronephrosis visualized.  Abdominal aorta:  Not visualized.  Other findings:  None.  IMPRESSION: 1. No acute findings. 2. Gallstones. 3. Bilateral renal cysts.   Electronically Signed   By: Signa Kell M.D.   On: 09/08/2013 11:17   Ct Abdomen Pelvis W Contrast  09/07/2013   CLINICAL DATA:  Abdominal pain.  EXAM: CT ABDOMEN AND PELVIS WITH CONTRAST  TECHNIQUE: Multidetector CT imaging of the abdomen and pelvis was performed using the standard protocol following bolus administration of intravenous contrast.  CONTRAST:  OMNIPAQUE IOHEXOL 300 MG/ML  SOLN  COMPARISON:  08/25/2013.   FINDINGS: Lung Bases: Dependent atelectasis.  CABG.  Liver:  Normal.  Spleen:  Normal.  Gallbladder:  Cholelithiasis.  Common bile duct:  No common duct stone identified.  Pancreas:  Normal.  Adrenal glands: Unchanged right adrenal low-attenuation lesion likely representing adenoma.  Kidneys: Multiple bilateral renal cysts. 1 cm calculus in the right renal pelvis. Right ureter appears normal. Left ureter is also normal.  Stomach:  Normal.  Small bowel:  Normal.  Colon: Ileocolonic anastomosis in the right lower quadrant. No complicating features. Moderate stool burden.  Pelvic Genitourinary: Thickening and enhancement of the urinary bladder suggesting cystitis. Mild trabeculation over the bladder dome. Prostatomegaly.  Bones:  No aggressive osseous lesion.  Vasculature: Atherosclerosis.  No acute vascular abnormality.  Body Wall: Normal.  IMPRESSION: 1. Mural enhancement and thickening of the urinary bladder wall compatible with cystitis. 2. Uncomplicated right lower quadrant ileocolonic anastomosis. 3. Cholelithiasis.  No CT findings of acute cholecystitis. 4. Nonobstructing right renal pelvis calculus. Bilateral renal cysts. 5. Low-attenuation lesion in the right adrenal likely represents adenoma.   Electronically Signed   By: Andreas Newport M.D.   On: 09/07/2013 22:10   Ct Abdomen Pelvis W Contrast  08/25/2013   CLINICAL DATA:  Left lower quadrant pain, history of colon cancer with partial colectomy, status post appendectomy  EXAM: CT ABDOMEN AND PELVIS WITH CONTRAST  TECHNIQUE: Multidetector CT imaging of the abdomen and pelvis was performed using the standard protocol following bolus administration of intravenous contrast.  CONTRAST:  OMNIPAQUE IOHEXOL 300 MG/ML  SOLN  COMPARISON:  10/12/2012  FINDINGS: Lung bases are unremarkable. Sagittal images of the spine shows degenerative changes lumbar spine. No destructive bony lesions are noted.  Mild hepatic fatty infiltration. No focal hepatic mass. Small  calcified gallstone within gallbladder measures 3 mm. The pancreas, spleen and adrenal glands are unremarkable. Kidneys are symmetrical in size and enhancement. No hydronephrosis or hydroureter. Multiple  bilateral renal cysts. Largest cyst in upper pole of the right kidney measures 2.8 cm. Largest cyst is midpole of the left kidney measures 2 cm. There is nonobstructive calcified calculus in right renal pelvis measures 1.2 cm. Atherosclerotic calcifications of abdominal aorta and iliac arteries are noted.  The patient is status post right hemicolectomy. Moderate stool noted in transverse colon. No evidence of colitis or diverticulitis.  No small bowel obstruction. No ascites or free air. No adenopathy. No aortic aneurysm.  There is enlarged prostate gland with indentation of urinary bladder base. Prostate gland measures 5 x 5.6 cm. There is thickening of urinary bladder wall. Small amount of air noted anterior aspect of urinary bladder. This may be due to resistance mentation or cystitis. Clinical correlation is necessary. Seminal vesicle calcifications are noted.  Delayed renal images shows bilateral renal symmetrical excretion. Bilateral visualized proximal ureter is unremarkable.  IMPRESSION: 1. Fatty infiltration of the liver. Small calcified gallstone within gallbladder measures 3 mm. 2. Bilateral multiple renal cysts. Nonobstructive calcified calculus right renal pelvis measures 1.2 cm. 3. Status post partial right hemicolectomy. Moderate stool noted in transverse colon. No colitis or diverticulitis. 4. There is thickening of urinary bladder wall. Small amount of air within anterior aspect of the bladder may be post instrumentation or due to cystitis. Clinical correlation is necessary. 5. Enlarged prostate gland with indentation of urinary bladder base. Correlation with urology exam is recommended. 6. Degenerative changes lumbar spine.   Electronically Signed   By: Natasha Mead M.D.   On: 08/25/2013 11:38   Nm  Myocar Multi W/spect W/wall Motion / Ef  09/03/2013   CLINICAL DATA:  Chest pain.  Coronary artery disease.  Hypertension.  EXAM: MYOCARDIAL IMAGING WITH SPECT (REST AND PHARMACOLOGIC-STRESS)  GATED LEFT VENTRICULAR WALL MOTION STUDY  LEFT VENTRICULAR EJECTION FRACTION  TECHNIQUE: Standard myocardial SPECT imaging was performed after resting intravenous injection of 10 mCi Tc-66m sestamibi. Subsequently, intravenous infusion of Lexiscan was performed under the supervision of the Cardiology staff. At peak effect of the drug, 30 mCi Tc-5m sestamibi was injected intravenously and standard myocardial SPECT imaging was performed. Quantitative gated imaging was also performed to evaluate left ventricular wall motion, and estimate left ventricular ejection fraction.  COMPARISON:  None.  FINDINGS: There is a fixed area of inferior anterior wall infarction. No peri-infarct ischemia or reversible defects are identified. Calculated ejection fraction is 52%. Wall motion is within normal limits. End-diastolic volume is 50 mL and end systolic volume is 24 mL.  IMPRESSION: 1. Small infarct along the inferior anterior wall. 2. No reversible ischemia. 3. Calculated ejection fraction of 52%.   Electronically Signed   By: Andreas Newport M.D.   On: 09/03/2013 14:31   Dg Abd 2 Views  09/06/2013   CLINICAL DATA:  Abdominal pain and constipation.  EXAM: ABDOMEN - 2 VIEW  COMPARISON:  Abdominal and pelvic CT scan of August 25, 2013.  FINDINGS: The bowel gas pattern is within the limits of normal. I cannot exclude constipation. A dense rounded calcification is consistent with a known 1.2 cm stone in the right renal pelvis. There are phleboliths within the pelvis. There seminal vesicle calcification is well in the pelvis. Vascular arterial calcifications also are demonstrated. There is surgical suture material in the right mid abdomen. The bony structures exhibit no acute abnormalities.  IMPRESSION: 1. The bowel gas pattern may  indicate constipation. There is no evidence of ileus or obstruction. 2. There is a 1.2 cm diameter stone on the right  which likely corresponds to the known stone in the right renal pelvis. The known gallstones are not clearly evident. Sign rib   Electronically Signed   By: David  [place]   On: 09/06/2013 13:13   Dg Foot Complete Right  09/08/2013   CLINICAL DATA:  Right foot and ankle pain.  EXAM: RIGHT FOOT COMPLETE - 3+ VIEW  COMPARISON:  None.  FINDINGS: There is no evidence of fracture or dislocation. The joint spaces are preserved. There is no evidence of talar subluxation; the subtalar joint is unremarkable in appearance.  Relatively diffuse vascular calcifications are noted.  IMPRESSION: 1. No evidence of fracture or dislocation. 2. Relatively diffuse vascular calcifications seen.   Electronically Signed   By: Roanna Raider M.D.   On: 09/08/2013 00:15    Microbiology: Recent Results (from the past 240 hour(s))  URINE CULTURE     Status: None   Collection Time    09/06/13 12:33 PM      Result Value Range Status   Specimen Description URINE, CLEAN CATCH   Final   Special Requests none Normal   Final   Culture  Setup Time     Final   Value: 09/06/2013 21:11     Performed at [facility] Count     Final   Value: 60,000 COLONIES/ML     Performed at [facility]   Culture     Final   Value: Multiple bacterial morphotypes present, none predominant. Suggest appropriate recollection if clinically indicated.     Performed at [facility]   Report Status 09/07/2013 FINAL   Final  URINE CULTURE     Status: None   Collection Time    09/07/13  6:21 PM      Result Value Range Status   Specimen Description URINE, CLEAN CATCH   Final   Special Requests NONE   Final   Culture  Setup Time     Final   Value: 09/07/2013 19:03     Performed at [facility] Count     Final   Value: >=100,000 COLONIES/ML     Performed at [facility]    Culture     Final   Value: Multiple bacterial morphotypes present, none predominant. Suggest appropriate recollection if clinically indicated.     Performed at [facility]   Report Status 09/09/2013 FINAL   Final     Labs: Basic Metabolic Panel:  Recent Labs Lab 09/07/13 1712 09/08/13 0146 09/09/13 0646 09/10/13 0435  NA 137 135 138 137  K 4.0 3.9 3.9 4.1  CL 97 99 103 103  CO2 29 25 22 22   GLUCOSE 256* 175* 158* 139*  BUN 19 14 17  26*  CREATININE 1.25 0.96 1.21 1.36*  CALCIUM 9.1 8.6 8.7 8.8   Liver Function Tests:  Recent Labs Lab 09/07/13 1712 09/08/13 0146 09/09/13 0646 09/10/13 0435  AST 11 20 12 12   ALT 10 9 7 8   ALKPHOS 81 78 76 70  BILITOT 0.2* 0.3 0.3 0.3  PROT 6.5 6.3 6.1 6.0  ALBUMIN 3.1* 3.0* 2.9* 2.8*    Recent Labs Lab 09/07/13 1712  LIPASE 26   No results found for this basename: AMMONIA,  in the last 168 hours CBC:  Recent Labs Lab 09/07/13 1712 09/08/13 0146 09/09/13 0646 09/10/13 0435  WBC 7.1 9.0 5.0 6.6  NEUTROABS 4.6  --   --   --   HGB 14.6 15.3 14.0  14.1  HCT 41.7 43.1 40.8 40.3  MCV 81.0 81.2 81.0 82.6  PLT 179 175 153 140*   Cardiac Enzymes:  Recent Labs Lab 09/07/13 0146 09/08/13 0146 09/08/13 0800 09/08/13 1214  CKTOTAL 45  --   --   --   TROPONINI  --  <0.30 <0.30 <0.30   BNP: BNP (last 3 results) No results found for this basename: PROBNP,  in the last 8760 hours CBG:  Recent Labs Lab 09/11/13 1129 09/11/13 1632 09/11/13 1954 09/11/13 2243 09/12/13 0829  GLUCAP 167* 140* 148* 160* 182*       Signed:  Penny Pia  [facility] 09/12/2013, 9:30 AM

## 2013-09-12 NOTE — Progress Notes (Signed)
Physical Therapy Treatment Patient Details Name: [name] MRN: [id number] DOB: 02-02-1941 Today's Date: 09/12/2013 Time: 2440-1027 PT Time Calculation (min): 12 min  PT Assessment / Plan / Recommendation  History of Present Illness   [name] is a 72 y.o. male with past medical history of CVA, expressive aphasia office CVA, COPD, hypertension, CAD, dyslipidemia. He presented with complaints of pain all over and possible chest pain. He was diagnosed with a UTI and was admitted. He was also diagnosed with UTI recently and was on Keflex.     PT Comments   Pt progressing with mobility. D/C disposition updated due to plan to D/C home with family today. Pt encouraged to ambulate with AD to decrease risk of falls; family aware of recommendation and agreeable.   Follow Up Recommendations  Home health PT;Supervision/Assistance - 24 hour     Does the patient have the potential to tolerate intense rehabilitation     Barriers to Discharge        Equipment Recommendations  None recommended by PT    Recommendations for Other Services    Frequency Min 3X/week   Progress towards PT Goals Progress towards PT goals: Progressing toward goals  Plan Discharge plan needs to be updated    Precautions / Restrictions Precautions Precautions: Fall Precaution Comments: daughter reports multiple falls at home Restrictions Weight Bearing Restrictions: No   Pertinent Vitals/Pain No complaints.     Mobility  Bed Mobility Bed Mobility: Not assessed Transfers Transfers: Sit to Stand;Stand to Sit Sit to Stand: 5: Supervision;From chair/3-in-1;With armrests Stand to Sit: 5: Supervision;To chair/3-in-1;With armrests Details for Transfer Assistance: supervision for safety; cues for hand placement and safe technique Ambulation/Gait Ambulation/Gait Assistance: 4: Min guard Ambulation Distance (Feet): 100 Feet Assistive device: 1 person hand held assist Ambulation/Gait Assistance Details:  pt requires UE support to brace himself; is agreeable to ambulate with SPC to increase stability; with fatigue begins to drag Rt LE; cues for safety  Gait Pattern: Decreased stride length;Narrow base of support;Shuffle Gait velocity: decreased  Stairs: No Wheelchair Mobility Wheelchair Mobility: No         PT Diagnosis:    PT Problem List:   PT Treatment Interventions:     PT Goals (current goals can now be found in the care plan section) Acute Rehab PT Goals Patient Stated Goal: unable to state PT Goal Formulation: With patient/family Time For Goal Achievement: 09/22/13 Potential to Achieve Goals: Fair  Visit Information  Last PT Received On: 09/12/13 Assistance Needed: +1    Subjective Data  Subjective: pt sitting in recliner at nurses desk due to agitation; agreeable to ambulate Patient Stated Goal: unable to state   Cognition  Cognition Arousal/Alertness: Awake/alert Behavior During Therapy: Restless;Impulsive Overall Cognitive Status: Difficult to assess Difficult to assess due to: Impaired communication    Balance  Balance Balance Assessed: Yes Static Standing Balance Static Standing - Balance Support: Right upper extremity supported;During functional activity Static Standing - Level of Assistance:  (min guard) High Level Balance High Level Balance Activites: Head turns;Direction changes High Level Balance Comments: requires UE support for stability  End of Session PT - End of Session Equipment Utilized During Treatment: Gait belt Activity Tolerance: Patient tolerated treatment well Patient left: in chair;with call bell/phone within reach;with family/visitor present Nurse Communication: Mobility status;Precautions   GP Functional Assessment Tool Used: clinical judgement  Functional Limitation: Mobility: Walking and moving around Mobility: Walking and Moving Around Current Status ([id number]): At least 1 percent but less than  20 percent impaired, limited or  restricted Mobility: Walking and Moving Around Goal Status [phone]): At least 1 percent but less than 20 percent impaired, limited or restricted Mobility: Walking and Moving Around Discharge Status [phone]): At least 1 percent but less than 20 percent impaired, limited or restricted   Donell Sievert, [place] [phone] 09/12/2013, 3:01 PM

## 2013-09-26 ENCOUNTER — Other Ambulatory Visit: Payer: Self-pay | Admitting: Urology

## 2013-10-03 ENCOUNTER — Emergency Department (HOSPITAL_COMMUNITY)
Admission: EM | Admit: 2013-10-03 | Discharge: 2013-10-03 | Disposition: A | Discharge status: Home / Self Care | Payer: Medicare Other | Attending: Emergency Medicine | Admitting: Emergency Medicine

## 2013-10-03 ENCOUNTER — Encounter (HOSPITAL_COMMUNITY): Payer: Self-pay | Admitting: Emergency Medicine

## 2013-10-03 ENCOUNTER — Emergency Department (HOSPITAL_COMMUNITY): Payer: Medicare Other

## 2013-10-03 ENCOUNTER — Encounter (HOSPITAL_COMMUNITY): Payer: Self-pay | Admitting: *Deleted

## 2013-10-03 DIAGNOSIS — Z862 Personal history of diseases of the blood and blood-forming organs and certain disorders involving the immune mechanism: Secondary | ICD-10-CM | POA: Insufficient documentation

## 2013-10-03 DIAGNOSIS — Z7902 Long term (current) use of antithrombotics/antiplatelets: Secondary | ICD-10-CM | POA: Insufficient documentation

## 2013-10-03 DIAGNOSIS — J449 Chronic obstructive pulmonary disease, unspecified: Secondary | ICD-10-CM | POA: Insufficient documentation

## 2013-10-03 DIAGNOSIS — Z85038 Personal history of other malignant neoplasm of large intestine: Secondary | ICD-10-CM | POA: Insufficient documentation

## 2013-10-03 DIAGNOSIS — R109 Unspecified abdominal pain: Secondary | ICD-10-CM

## 2013-10-03 DIAGNOSIS — K5900 Constipation, unspecified: Secondary | ICD-10-CM | POA: Insufficient documentation

## 2013-10-03 DIAGNOSIS — Z79899 Other long term (current) drug therapy: Secondary | ICD-10-CM | POA: Insufficient documentation

## 2013-10-03 DIAGNOSIS — R1032 Left lower quadrant pain: Secondary | ICD-10-CM | POA: Insufficient documentation

## 2013-10-03 DIAGNOSIS — J4489 Other specified chronic obstructive pulmonary disease: Secondary | ICD-10-CM | POA: Insufficient documentation

## 2013-10-03 DIAGNOSIS — I252 Old myocardial infarction: Secondary | ICD-10-CM | POA: Insufficient documentation

## 2013-10-03 DIAGNOSIS — I2510 Atherosclerotic heart disease of native coronary artery without angina pectoris: Secondary | ICD-10-CM | POA: Insufficient documentation

## 2013-10-03 DIAGNOSIS — G40919 Epilepsy, unspecified, intractable, without status epilepticus: Secondary | ICD-10-CM | POA: Insufficient documentation

## 2013-10-03 DIAGNOSIS — F17200 Nicotine dependence, unspecified, uncomplicated: Secondary | ICD-10-CM | POA: Insufficient documentation

## 2013-10-03 DIAGNOSIS — Z7982 Long term (current) use of aspirin: Secondary | ICD-10-CM | POA: Insufficient documentation

## 2013-10-03 DIAGNOSIS — Z951 Presence of aortocoronary bypass graft: Secondary | ICD-10-CM | POA: Insufficient documentation

## 2013-10-03 DIAGNOSIS — I69920 Aphasia following unspecified cerebrovascular disease: Secondary | ICD-10-CM | POA: Insufficient documentation

## 2013-10-03 DIAGNOSIS — E119 Type 2 diabetes mellitus without complications: Secondary | ICD-10-CM | POA: Insufficient documentation

## 2013-10-03 DIAGNOSIS — I10 Essential (primary) hypertension: Secondary | ICD-10-CM | POA: Insufficient documentation

## 2013-10-03 DIAGNOSIS — Z9089 Acquired absence of other organs: Secondary | ICD-10-CM | POA: Insufficient documentation

## 2013-10-03 LAB — COMPREHENSIVE METABOLIC PANEL
ALT: 8 U/L (ref 0–53)
AST: 10 U/L (ref 0–37)
Albumin: 2.8 g/dL — ABNORMAL LOW (ref 3.5–5.2)
Alkaline Phosphatase: 84 U/L (ref 39–117)
BUN: 15 mg/dL (ref 6–23)
CALCIUM: 9.1 mg/dL (ref 8.4–10.5)
CO2: 26 meq/L (ref 19–32)
Chloride: 99 mEq/L (ref 96–112)
Creatinine, Ser: 1.08 mg/dL (ref 0.50–1.35)
GFR, EST AFRICAN AMERICAN: 78 mL/min — AB (ref >90)
GFR, EST NON AFRICAN AMERICAN: 67 mL/min — AB (ref >90)
Glucose, Bld: 198 mg/dL — ABNORMAL HIGH (ref 70–99)
Potassium: 3.9 mEq/L (ref 3.7–5.3)
SODIUM: 138 meq/L (ref 137–147)
Total Bilirubin: 0.4 mg/dL (ref 0.3–1.2)
Total Protein: 6 g/dL (ref 6.0–8.3)

## 2013-10-03 LAB — CBC WITH DIFFERENTIAL/PLATELET
Basophils Absolute: 0 10*3/uL (ref 0.0–0.1)
Basophils Relative: 0 % (ref 0–1)
EOS PCT: 8 % — AB (ref 0–5)
Eosinophils Absolute: 0.6 10*3/uL (ref 0.0–0.7)
HEMATOCRIT: 37.8 % — AB (ref 39.0–52.0)
Hemoglobin: 13 g/dL (ref 13.0–17.0)
LYMPHS PCT: 10 % — AB (ref 12–46)
Lymphs Abs: 0.8 10*3/uL (ref 0.7–4.0)
MCH: 28.1 pg (ref 26.0–34.0)
MCHC: 34.4 g/dL (ref 30.0–36.0)
MCV: 81.8 fL (ref 78.0–100.0)
MONO ABS: 0.5 10*3/uL (ref 0.1–1.0)
Monocytes Relative: 7 % (ref 3–12)
Neutro Abs: 5.8 10*3/uL (ref 1.7–7.7)
Neutrophils Relative %: 76 % (ref 43–77)
Platelets: 128 10*3/uL — ABNORMAL LOW (ref 150–400)
RBC: 4.62 MIL/uL (ref 4.22–5.81)
RDW: 13 % (ref 11.5–15.5)
WBC: 7.6 10*3/uL (ref 4.0–10.5)

## 2013-10-03 LAB — URINALYSIS, ROUTINE W REFLEX MICROSCOPIC
BILIRUBIN URINE: NEGATIVE
Glucose, UA: >1000 mg/dL — AB
KETONES UR: NEGATIVE mg/dL
Nitrite: NEGATIVE
Specific Gravity, Urine: 1.021 (ref 1.005–1.030)
UROBILINOGEN UA: 0.2 mg/dL (ref 0.0–1.0)
pH: 6 (ref 5.0–8.0)

## 2013-10-03 LAB — URINE MICROSCOPIC-ADD ON

## 2013-10-03 LAB — LIPASE, BLOOD: Lipase: 17 U/L (ref 11–59)

## 2013-10-03 MED ORDER — ONDANSETRON HCL 4 MG/2ML IJ SOLN
4.0000 mg | Freq: Once | INTRAMUSCULAR | Status: AC
  Administered 2013-10-03: 4 mg via INTRAVENOUS
  Filled 2013-10-03: qty 2

## 2013-10-03 MED ORDER — SODIUM CHLORIDE 0.9 % IV BOLUS (SEPSIS)
500.0000 mL | Freq: Once | INTRAVENOUS | Status: AC
  Administered 2013-10-03: 500 mL via INTRAVENOUS

## 2013-10-03 MED ORDER — IOHEXOL 300 MG/ML  SOLN
100.0000 mL | Freq: Once | INTRAMUSCULAR | Status: AC | PRN
  Administered 2013-10-03: 100 mL via INTRAVENOUS

## 2013-10-03 MED ORDER — MORPHINE SULFATE 4 MG/ML IJ SOLN
4.0000 mg | Freq: Once | INTRAMUSCULAR | Status: AC
  Administered 2013-10-03: 4 mg via INTRAVENOUS
  Filled 2013-10-03: qty 1

## 2013-10-03 MED ORDER — IOHEXOL 300 MG/ML  SOLN
50.0000 mL | Freq: Once | INTRAMUSCULAR | Status: AC | PRN
  Administered 2013-10-03: 50 mL via ORAL

## 2013-10-03 NOTE — ED Provider Notes (Signed)
CSN: [id number]     Arrival date & time 10/03/13  0428 History   First MD Initiated Contact with Patient 10/03/13 0515     Chief Complaint  Patient presents with  . Abdominal Pain  . Constipation   (Consider location/radiation/quality/duration/timing/severity/associated sxs/prior Treatment) HPI Patient is a difficult historian due to previous stroke and ongoing aphasia. He presents with one day of abdominal pain. This is diffuse but worse on the left than the right. Patient states she's not had a bowel movement for 4 days. He's had no nausea /vomiting. Denies chest pain or shortness of breath. No definite fever or chills.  Past Medical History  Diagnosis Date  . Myocardial infarction   . CVA (cerebral infarction)   . Diabetes mellitus   . Hypertension   . Diabetes mellitus   . Aphasia   . Dysphagia   . COPD (chronic obstructive pulmonary disease)   . Shortness of breath   . Anemia   . Stroke   . Colon cancer   . Hemorrhoid   . Intractable seizures   . Pseudobulbar affect   . Dyslipidemia   . CAD (coronary artery disease)   . History of hemorrhoids    Past Surgical History  Procedure Laterality Date  . Coronary artery bypass graft    . Appendectomy    . Dental surgery    . Colonoscopy  03/13/2012    Procedure: COLONOSCOPY;  Surgeon: Lafayette Dragon, MD;  Location: [facility];  Service: Endoscopy;  Laterality: N/A;  . Partial colectomy  03/22/2012    Procedure: PARTIAL COLECTOMY;  Surgeon: Gwenyth Ober, MD;  Location: [place] [facility];  Service: General;  Laterality: Right;  . Coronary artery bypass graft      x3  . Coronary stent placement     Family History  Problem Relation Age of Onset  . Breast cancer Mother   . Heart disease Brother   . Heart attack Brother   . Colon cancer Maternal Uncle   . Heart attack Father   . Heart attack Sister    History  Substance Use Topics  . Smoking status: Current Some Day Smoker -- 1.00 packs/day for 16 years    Types: Cigarettes  .  Smokeless tobacco: Never Used  . Alcohol Use: No    Review of Systems  Constitutional: Negative for fever and chills.  Respiratory: Negative for cough and shortness of breath.   Cardiovascular: Negative for chest pain.  Gastrointestinal: Positive for abdominal pain and constipation. Negative for nausea, vomiting and diarrhea.  Musculoskeletal: Negative for back pain, myalgias, neck pain and neck stiffness.  Skin: Negative for rash and wound.  Neurological: Negative for dizziness, weakness and numbness.  All other systems reviewed and are negative.    Allergies  Review of patient's allergies indicates no known allergies.  Home Medications   Current Outpatient Rx  Name  Route  Sig  Dispense  Refill  . albuterol (PROVENTIL HFA;VENTOLIN HFA) 108 (90 BASE) MCG/ACT inhaler   Inhalation   Inhale 1-2 puffs into the lungs every 6 (six) hours as needed for wheezing.   1 Inhaler   0   . aspirin EC 81 MG tablet   Oral   Take 81 mg by mouth daily. For CVA prophylaxis.         Marland Kitchen clopidogrel (PLAVIX) 75 MG tablet   Oral   Take 75 mg by mouth daily.         Marland Kitchen escitalopram (LEXAPRO) 10 MG tablet  Oral   Take 10 mg by mouth daily.         . finasteride (PROSCAR) 5 MG tablet   Oral   Take 5 mg by mouth daily.         Marland Kitchen levETIRAcetam (KEPPRA) 500 MG tablet   Oral   Take 1 tablet (500 mg total) by mouth every 12 (twelve) hours.   60 tablet   1   . lubiprostone (AMITIZA) 8 MCG capsule   Oral   Take 1 capsule (8 mcg total) by mouth 2 (two) times daily with a meal.   60 capsule   6   . Multiple Vitamin (MULITIVITAMIN WITH MINERALS) TABS   Oral   Take 1 tablet by mouth daily.         . nitrofurantoin, macrocrystal-monohydrate, (MACROBID) 100 MG capsule   Oral   Take 100 mg by mouth 2 (two) times daily.         . nitroGLYCERIN (NITROSTAT) 0.4 MG SL tablet   Sublingual   Place 0.4 mg under the tongue every 5 (five) minutes x 3 doses as needed. For chest pain          . oxazepam (SERAX) 30 MG capsule   Oral   Take 30 mg by mouth at bedtime.         . polyethylene glycol (MIRALAX) packet   Oral   Take 17 g by mouth 2 (two) times daily.   30 each   0   . promethazine (PHENERGAN) 25 MG tablet   Oral   Take 25 mg by mouth every 6 (six) hours as needed. For nausea.         . sitaGLIPtan-metformin (JANUMET) 50-1000 MG per tablet   Oral   Take 1 tablet by mouth 2 (two) times daily with a meal.         . topiramate (TOPAMAX) 25 MG tablet   Oral   Take 1 tablet (25 mg total) by mouth 2 (two) times daily.   60 tablet   3   . valsartan (DIOVAN) 160 MG tablet   Oral   Take 160 mg by mouth daily.         . vitamin C (ASCORBIC ACID) 500 MG tablet   Oral   Take 500 mg by mouth 2 (two) times daily.         Marland Kitchen zinc sulfate 220 MG capsule   Oral   Take 220 mg by mouth daily.          BP 172/74  Pulse 73  Temp(Src) 98.8 F (37.1 C) (Oral)  Resp 21  SpO2 95% Physical Exam  Nursing note and vitals reviewed. Constitutional: He is oriented to person, place, and time. He appears well-developed and well-nourished. No distress.  HENT:  Head: Normocephalic and atraumatic.  Mouth/Throat: Oropharynx is clear and moist. No oropharyngeal exudate.  Eyes: EOM are normal. Pupils are equal, round, and reactive to light.  Neck: Normal range of motion. Neck supple.  Cardiovascular: Normal rate and regular rhythm.   Pulmonary/Chest: Effort normal and breath sounds normal. No respiratory distress. He has no wheezes. He has no rales.  Abdominal: Soft. Bowel sounds are normal. He exhibits no distension and no mass. There is tenderness (left lower quadrant tenderness to palpation more so than his generalized abdominal tenderness). There is no rebound and no guarding.  Musculoskeletal: Normal range of motion. He exhibits no edema and no tenderness.  Neurological: He is alert and oriented to person,  place, and time.  Expressive aphasia.   Skin: Skin  is warm and dry. No rash noted. No erythema.  Psychiatric: He has a normal mood and affect. His behavior is normal.    ED Course  Procedures (including critical care time) Labs Review Labs Reviewed  CBC WITH DIFFERENTIAL  COMPREHENSIVE METABOLIC PANEL  LIPASE, BLOOD  URINALYSIS, ROUTINE W REFLEX MICROSCOPIC   Imaging Review No results found.  EKG Interpretation   None       MDM    Patient signed out to oncoming emergency physician pending CT of his abdomen.  Julianne Rice, MD 10/23/13 [phone]

## 2013-10-03 NOTE — Discharge Instructions (Signed)
Abdominal Pain, Adult °Many things can cause abdominal pain. Usually, abdominal pain is not caused by a disease and will improve without treatment. It can often be observed and treated at home. Your health care provider will do a physical exam and possibly order blood tests and X-rays to help determine the seriousness of your pain. However, in many cases, more time must pass before a clear cause of the pain can be found. Before that point, your health care provider may not know if you need more testing or further treatment. °HOME CARE INSTRUCTIONS  °Monitor your abdominal pain for any changes. The following actions may help to alleviate any discomfort you are experiencing: °· Only take over-the-counter or prescription medicines as directed by your health care provider. °· Do not take laxatives unless directed to do so by your health care provider. °· Try a clear liquid diet (broth, tea, or water) as directed by your health care provider. Slowly move to a bland diet as tolerated. °SEEK MEDICAL CARE IF: °· You have unexplained abdominal pain. °· You have abdominal pain associated with nausea or diarrhea. °· You have pain when you urinate or have a bowel movement. °· You experience abdominal pain that wakes you in the night. °· You have abdominal pain that is worsened or improved by eating food. °· You have abdominal pain that is worsened with eating fatty foods. °SEEK IMMEDIATE MEDICAL CARE IF:  °· Your pain does not go away within 2 hours. °· You have a fever. °· You keep throwing up (vomiting). °· Your pain is felt only in portions of the abdomen, such as the right side or the left lower portion of the abdomen. °· You pass bloody or black tarry stools. °MAKE SURE YOU: °· Understand these instructions.   °· Will watch your condition.   °· Will get help right away if you are not doing well or get worse.   °Document Released: 06/14/2005 Document Revised: 06/25/2013 Document Reviewed: 05/14/2013 °Exit[employer]® Patient  Information ©2014 [employer]. ° °

## 2013-10-03 NOTE — ED Provider Notes (Signed)
CT scan reviewed. Overall rather stable but do have a liver finding that needs to be followed. Yeast in the urine of unknown significance at this time. Appears to have urology follow up. Will discharge home.  Jasper Riling. Alvino Chapel, [place] 10/03/13 [phone]

## 2013-10-03 NOTE — ED Notes (Signed)
Per EMS pt c/o of diffused ab pain and no bm last 4 days. 10/10 pain. Pain increase during palpation.

## 2013-10-04 LAB — URINE CULTURE: Colony Count: 75000

## 2013-10-06 ENCOUNTER — Encounter (HOSPITAL_COMMUNITY): Payer: Self-pay | Admitting: *Deleted

## 2013-10-06 ENCOUNTER — Ambulatory Visit (HOSPITAL_COMMUNITY)
Admission: RE | Admit: 2013-10-06 | Discharge: 2013-10-06 | Disposition: A | Discharge status: Home / Self Care | Payer: Medicare Other | Source: Ambulatory Visit | Attending: Urology | Admitting: Urology

## 2013-10-06 ENCOUNTER — Ambulatory Visit (HOSPITAL_COMMUNITY): Payer: Medicare Other

## 2013-10-06 ENCOUNTER — Encounter (HOSPITAL_COMMUNITY)
Admission: RE | Disposition: A | Discharge status: Home / Self Care | Payer: Self-pay | Source: Ambulatory Visit | Attending: Urology

## 2013-10-06 DIAGNOSIS — N200 Calculus of kidney: Secondary | ICD-10-CM | POA: Insufficient documentation

## 2013-10-06 DIAGNOSIS — Z8042 Family history of malignant neoplasm of prostate: Secondary | ICD-10-CM | POA: Insufficient documentation

## 2013-10-06 DIAGNOSIS — Z85038 Personal history of other malignant neoplasm of large intestine: Secondary | ICD-10-CM | POA: Insufficient documentation

## 2013-10-06 DIAGNOSIS — E7800 Pure hypercholesterolemia, unspecified: Secondary | ICD-10-CM | POA: Insufficient documentation

## 2013-10-06 DIAGNOSIS — N189 Chronic kidney disease, unspecified: Secondary | ICD-10-CM | POA: Insufficient documentation

## 2013-10-06 DIAGNOSIS — Z8673 Personal history of transient ischemic attack (TIA), and cerebral infarction without residual deficits: Secondary | ICD-10-CM | POA: Insufficient documentation

## 2013-10-06 DIAGNOSIS — F17200 Nicotine dependence, unspecified, uncomplicated: Secondary | ICD-10-CM | POA: Insufficient documentation

## 2013-10-06 DIAGNOSIS — R32 Unspecified urinary incontinence: Secondary | ICD-10-CM | POA: Insufficient documentation

## 2013-10-06 DIAGNOSIS — N401 Enlarged prostate with lower urinary tract symptoms: Secondary | ICD-10-CM | POA: Insufficient documentation

## 2013-10-06 DIAGNOSIS — I252 Old myocardial infarction: Secondary | ICD-10-CM | POA: Insufficient documentation

## 2013-10-06 DIAGNOSIS — E119 Type 2 diabetes mellitus without complications: Secondary | ICD-10-CM | POA: Insufficient documentation

## 2013-10-06 DIAGNOSIS — N138 Other obstructive and reflux uropathy: Secondary | ICD-10-CM | POA: Insufficient documentation

## 2013-10-06 DIAGNOSIS — Z79899 Other long term (current) drug therapy: Secondary | ICD-10-CM | POA: Insufficient documentation

## 2013-10-06 DIAGNOSIS — I129 Hypertensive chronic kidney disease with stage 1 through stage 4 chronic kidney disease, or unspecified chronic kidney disease: Secondary | ICD-10-CM | POA: Insufficient documentation

## 2013-10-06 LAB — GLUCOSE, CAPILLARY: Glucose-Capillary: 172 mg/dL — ABNORMAL HIGH (ref 70–99)

## 2013-10-06 SURGERY — LITHOTRIPSY, ESWL
Anesthesia: LOCAL | Laterality: Right

## 2013-10-06 MED ORDER — DIAZEPAM 5 MG PO TABS
10.0000 mg | ORAL_TABLET | ORAL | Status: AC
  Administered 2013-10-06: 10 mg via ORAL
  Filled 2013-10-06: qty 2

## 2013-10-06 MED ORDER — CIPROFLOXACIN IN D5W 400 MG/200ML IV SOLN
400.0000 mg | INTRAVENOUS | Status: AC
Start: 2013-10-06 — End: 2013-10-06
  Administered 2013-10-06: 400 mg via INTRAVENOUS
  Filled 2013-10-06: qty 200

## 2013-10-06 MED ORDER — ASPIRIN EC 81 MG PO TBEC: 81.0000 mg | DELAYED_RELEASE_TABLET | Freq: Every day | ORAL | Status: DC

## 2013-10-06 MED ORDER — DIPHENHYDRAMINE HCL 25 MG PO CAPS
25.0000 mg | ORAL_CAPSULE | ORAL | Status: AC
  Administered 2013-10-06: 25 mg via ORAL
  Filled 2013-10-06: qty 1

## 2013-10-06 MED ORDER — SODIUM CHLORIDE 0.9 % IV SOLN
INTRAVENOUS | Status: DC
Start: 2013-10-06 — End: 2013-10-06
  Administered 2013-10-06: 10:00:00 via INTRAVENOUS

## 2013-10-06 MED ORDER — CLOPIDOGREL BISULFATE 75 MG PO TABS: 75.0000 mg | ORAL_TABLET | Freq: Every day | ORAL | Status: DC

## 2013-10-06 MED ORDER — VANCOMYCIN HCL IN DEXTROSE 1-5 GM/200ML-% IV SOLN
1000.0000 mg | INTRAVENOUS | Status: AC
Start: 2013-10-06 — End: 2013-10-06
  Administered 2013-10-06: 1000 mg via INTRAVENOUS
  Filled 2013-10-06: qty 200

## 2013-10-06 MED ORDER — OXYCODONE-ACETAMINOPHEN 5-325 MG PO TABS: 1.0000 | ORAL_TABLET | ORAL | Status: DC | PRN

## 2013-10-06 NOTE — H&P (Signed)
History of Present Illness              F/u -     1- BPH / inc emptying - good flow , some frequency and urgency and UUI with wet diaper day and night. NG risk - stroke syndrome  -Feb 2014 - ER for abd pain and distention - CT A/P showed a 1 cm RLP stone, mild right pelviectasis, complex left renal cyst 2 cm, mild bladder distention, a 6 mm stone in bladder and BPH (70 g).   -Sept 2014 start finasteride  -Oct 2014 PVR 200.48 ml, hyerpchoic area at bladder base (on review of CT this is calcified SV)  Cystoscopy - urethra normal, prostatic urethra mild BPH, bladder muscosa normal, NO stone, moderate trabeculation.   -Dec 2014 UDS - c/w small capacity hypersensitive bladder, voiding off instability with straining and flare of EMG. Rec pelvic floor PT, add OAB med.   -Dec 2014 Urine Cx enterococcus, repeat Urine cx mixed growth    2- renal stone / renal cyst -   -Feb 2014 - ER for abd pain and distention - CT A/P showed a 1 cm RLP stone, mild right pelviectasis, complex left renal cyst 2 cm, mild bladder distention, a 6 mm stone in bladder and BPH (70 g).   -Oct 2014 Renal US - no hydro, right stone 9 mm, bilateral cysts - stable compared to CT. No bladder stone on cysto. Cystic findings stable compared to CT in 2014 and 2013   -Dec 2014 - CT A/P - right renal stone now in right renal pelvis - bladder normal. Left renal cysts stable.     3-Elevated PSA - uncle had PCa.   -Feb 2014 70 g prostate on CT  -Sept 2014 normal DRE, PSA 5.94 (PSAD = 0.09), started finasteride  -Jan 2015 PSA 2.9    4-CRI  -Aug 2013 Cr 0.8  -Feb 2014 Cr 1.2  -Sep 2014 Cr 1.45  -Dec 2014 Cr 1.36 (gfr 51)    He is a long-time smoker of 3 packs per day. He is on Plavix and medicines for diabetes.       Jan 2015 interval hx  Today, pt seen in continued management of above. He continues finasteride. He has a slow stream. His incontinence is stable.   He has occasional mild right flank  pain. He hasn't had any stone passage.     He was in hospital Dec 2014. I reviewed labs, Epic notes, imaging. Updated above.     KUB today - comparison CT dec 2014 and jan 2014 - stone now in right renal pelvis. bones and bowel has normal.          Past Medical History Problems  1. History of Acute Myocardial Infarction (V12.59) 2. History of Arthritis (V13.4) 3. History of Asthma (493.90) 4. History of Colon Cancer (V10.05) 5. History of cardiac disorder (V12.50) 6. History of diabetes mellitus (V12.29) 7. History of hypercholesterolemia (V12.29) 8. History of hypertension (V12.59) 9. History of Seizure 10. History of Stroke syndrome (436)  Surgical History Problems  1. History of Colon Surgery 2. History of Heart Surgery  Current Meds 1. Amitiza CAPS;  Therapy: (Recorded:09Sep2014) to Recorded 2. Aspirin TABS;  Therapy: (Recorded:09Sep2014) to Recorded 3. Atorvastatin Calcium TABS;  Therapy: (Recorded:09Sep2014) to Recorded 4. Clopidogrel Bisulfate TABS;  Therapy: (Recorded:09Sep2014) to Recorded 5. Fenofibrate TABS;  Therapy: (Recorded:09Sep2014) to Recorded 6. Finasteride 5 MG Oral Tablet; Take 1 tablet every day;  Therapy: [id number] to (Evaluate:04Sep2015); Last  Rx:09Sep2014 Ordered 7. Hydrocodone-Acetaminophen TABS;  Therapy: (Recorded:09Sep2014) to Recorded 8. Janumet TABS;  Therapy: (Recorded:09Sep2014) to Recorded 9. LevETIRAcetam TABS;  Therapy: (Recorded:09Sep2014) to Recorded 10. Lexapro TABS;   Therapy: (Recorded:09Sep2014) to Recorded 11. Topiramate TABS;   Therapy: (Recorded:09Sep2014) to Recorded  Allergies Medication  1. No Known Drug Allergies  Family History Problems  1. Family history of Death In The Family Father 2. Family history of Death In The Family Mother 3. Family history of Family Health Status Number Of Children   1 daughter 4. Family history of Nephrolithiasis 5. No pertinent family history : Daughter 65. Family  history of Prostate Cancer (V16.42)  Social History Problems  1. Denied: History of Alcohol Use 2. Caffeine Use   2 per day 3. Current every day smoker (305.1)   smokes 3ppd for 51yrs 4. Marital History - Currently Married  Review of Systems  Cardiovascular: no leg swelling.  Respiratory: no cough.    Physical Exam Constitutional: Well nourished and well developed . No acute distress.  Pulmonary: No respiratory distress and normal respiratory rhythm and effort.  Cardiovascular: Heart rate and rhythm are normal . No peripheral edema.  Neuro/Psych:. Mood and affect are appropriate.    Results/Data Urine [Data Includes: Last 1 Day]   [id number]  COLOR YELLOW   APPEARANCE CLOUDY   SPECIFIC GRAVITY 1.025   pH 5.5   GLUCOSE > 1000 mg/dL  BILIRUBIN NEG   KETONE NEG mg/dL  BLOOD MOD   PROTEIN 100 mg/dL  UROBILINOGEN 0.2 mg/dL  NITRITE NEG   LEUKOCYTE ESTERASE SMALL   SQUAMOUS EPITHELIAL/HPF RARE   WBC TNTC WBC/hpf  RBC 0-2 RBC/hpf  BACTERIA FEW   CRYSTALS NONE SEEN   CASTS NONE SEEN   Other FEW YEAST    Old records or history reviewed:Marland Kitchen  The following images/tracing/specimen were independently visualized:  dec 2014 ct, abd u/s.    Assessment Assessed  1. Nephrolithiasis (592.0) 2. Benign prostatic hyperplasia with urinary obstruction (600.01,599.69) 3. 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 Follow-up Month x 3 Office  Follow-up  Status: Hold For - Appointment,Date of Service   Requested for: [id number] Health Maintenance  2. UA With REFLEX; [Do Not Release]; Status:Complete;   Done: [id number] 10:16AM Nephrolithiasis  3. Follow-up Schedule Surgery Office  Follow-up  Status: Hold For - Appointment   Requested for: [id number]  Discussion/Summary BPH / PSA - stable on finasteride  UUI - discussed nature, R.b of OAB meds and trial of Vesicare/samples  given.   Right renal stone - discussed stone now in renal pelvis, nature r/b of continued surveillance, ESWL, URS - all questions answered. They elect to proceed with ESWL. Will need to be off plavix - discussed bleeding. Discussed need for staged procedure. Cover with vanc and cipro.     cc; Dr. Tollie Pizza

## 2013-10-06 NOTE — Op Note (Signed)
See [facility] scanned op note - s/p right ESWL

## 2013-10-06 NOTE — Discharge Instructions (Signed)
Lithotripsy, Care After °Refer to this sheet in the next few weeks. These instructions provide you with information on caring for yourself after your procedure. Your health care provider may also give you more specific instructions. Your treatment has been planned according to current medical practices, but problems sometimes occur. Call your health care provider if you have any problems or questions after your procedure. °WHAT TO EXPECT AFTER THE PROCEDURE  °· Your urine may have a red tinge for a few days after treatment. Blood loss is usually minimal. °· You may have soreness in the back or flank area. This usually goes away after a few days. The procedure can cause blotches or bruises on the back where the pressure wave enters the skin. These marks usually cause only minimal discomfort and should disappear in a short time. °· Stone fragments should begin to pass within 24 hours of treatment. However, a delayed passage is not unusual. °· You may have pain, discomfort, and feel sick to your stomach (nauseated) when the crushed fragments of stone are passed down the tube from the kidney to the bladder. Stone fragments can pass soon after the procedure and may last for up to 4 8 weeks. °· A small number of patients may have severe pain when stone fragments are not able to pass, which leads to an obstruction. °· If your stone is greater than 1 inch (2.5 cm) in diameter or if you have multiple stones that have a combined diameter greater than 1 inch (2.5 cm), you may require more than one treatment. °· If you had a stent placed prior to your procedure, you may experience some discomfort, especially during urination. You may experience the pain or discomfort in your flank or back, or you may experience a sharp pain or discomfort at the base of your penis or in your lower abdomen. The discomfort usually lasts only a few minutes after urinating. °HOME CARE INSTRUCTIONS  °· Rest at home until you feel your energy  improving. °· Only take over-the-counter or prescription medicines for pain, discomfort, or fever as directed by your health care provider. Depending on the type of lithotripsy, you may need to take antibiotics and anti-inflammatory medicines for a few days. °· Drink enough water and fluids to keep your urine clear or pale yellow. This helps "flush" your kidneys. It helps pass any remaining pieces of stone and prevents stones from coming back. °· Most people can resume daily activities within 1 2 days after standard lithotripsy. It can take longer to recover from laser and percutaneous lithotripsy. °· If the stones are in your urinary system, you may be asked to strain your urine at home to look for stones. Any stones that are found can be sent to a medical lab for examination. °· Visit your health care provider for a follow-up appointment in a few weeks. Your doctor may remove your stent if you have one. Your health care provider will also check to see whether stone particles still remain. °SEEK MEDICAL CARE IF:  °· Your pain is not relieved by medicine. °· You have a lasting nauseous feeling. °· You feel there is too much blood in the urine. °· You develop persistent problems with frequent or painful urination that does not at least partially improve after 2 days following the procedure. °· You have a congested cough. °· You feel lightheaded. °· You develop a rash or any other signs that might suggest an allergic problem. °· You develop any reaction or side   effects to your medicine(s). °SEEK IMMEDIATE MEDICAL CARE IF:  °· You experience severe back or flank pain or both. °· You see nothing but blood when you urinate. °· You cannot pass any urine at all. °· You have a fever or shaking chills. °· You develop shortness of breath, difficulty breathing, or chest pain. °· You develop vomiting that will not stop after 6 8 hours. °· You have a fainting episode. °Document Released: 09/24/2007 Document Revised: 06/25/2013  Document Reviewed: 03/20/2013 °Exit[employer]® Patient Information ©2014 [employer]. ° °

## 2013-10-06 NOTE — Interval H&P Note (Signed)
History and Physical Interval Note:  10/06/2013 1:21 PM  [name]  has presented today for surgery, with the diagnosis of RIGHT RENAL STONE   The various methods of treatment have been discussed with the patient and family. After consideration of risks, benefits and other options for treatment, the patient has consented to  Procedure(s): RIGHT EXTRACORPOREAL SHOCK WAVE LITHOTRIPSY RIGHT (ESWL) (Right) as a surgical intervention .  The patient's history has been reviewed, patient examined, no change in status, stable for surgery.  I have reviewed the patient's chart and labs.  Questions were answered to the patient's satisfaction.  He appears well today. Denies fever or dysuria.    Fredricka Bonine

## 2013-10-08 ENCOUNTER — Encounter (HOSPITAL_COMMUNITY): Payer: Self-pay | Admitting: Emergency Medicine

## 2013-10-08 ENCOUNTER — Emergency Department (HOSPITAL_COMMUNITY): Payer: Medicare Other

## 2013-10-08 ENCOUNTER — Inpatient Hospital Stay (HOSPITAL_COMMUNITY)
Admission: EM | Admit: 2013-10-08 | Discharge: 2013-10-14 | DRG: 683 | Disposition: A | Discharge status: Hospice | Payer: Medicare Other | Attending: Internal Medicine | Admitting: Internal Medicine

## 2013-10-08 ENCOUNTER — Inpatient Hospital Stay (HOSPITAL_COMMUNITY): Payer: Medicare Other

## 2013-10-08 DIAGNOSIS — N390 Urinary tract infection, site not specified: Secondary | ICD-10-CM

## 2013-10-08 DIAGNOSIS — G40319 Generalized idiopathic epilepsy and epileptic syndromes, intractable, without status epilepticus: Secondary | ICD-10-CM

## 2013-10-08 DIAGNOSIS — R109 Unspecified abdominal pain: Secondary | ICD-10-CM

## 2013-10-08 DIAGNOSIS — K5900 Constipation, unspecified: Secondary | ICD-10-CM

## 2013-10-08 DIAGNOSIS — I129 Hypertensive chronic kidney disease with stage 1 through stage 4 chronic kidney disease, or unspecified chronic kidney disease: Secondary | ICD-10-CM | POA: Diagnosis present

## 2013-10-08 DIAGNOSIS — S37019A Minor contusion of unspecified kidney, initial encounter: Secondary | ICD-10-CM

## 2013-10-08 DIAGNOSIS — R531 Weakness: Secondary | ICD-10-CM

## 2013-10-08 DIAGNOSIS — Z8249 Family history of ischemic heart disease and other diseases of the circulatory system: Secondary | ICD-10-CM

## 2013-10-08 DIAGNOSIS — G40909 Epilepsy, unspecified, not intractable, without status epilepticus: Secondary | ICD-10-CM | POA: Diagnosis present

## 2013-10-08 DIAGNOSIS — F17200 Nicotine dependence, unspecified, uncomplicated: Secondary | ICD-10-CM | POA: Diagnosis present

## 2013-10-08 DIAGNOSIS — N183 Chronic kidney disease, stage 3 unspecified: Secondary | ICD-10-CM | POA: Diagnosis present

## 2013-10-08 DIAGNOSIS — D3500 Benign neoplasm of unspecified adrenal gland: Secondary | ICD-10-CM

## 2013-10-08 DIAGNOSIS — E119 Type 2 diabetes mellitus without complications: Secondary | ICD-10-CM

## 2013-10-08 DIAGNOSIS — I639 Cerebral infarction, unspecified: Secondary | ICD-10-CM

## 2013-10-08 DIAGNOSIS — Z8673 Personal history of transient ischemic attack (TIA), and cerebral infarction without residual deficits: Secondary | ICD-10-CM

## 2013-10-08 DIAGNOSIS — N201 Calculus of ureter: Secondary | ICD-10-CM | POA: Diagnosis present

## 2013-10-08 DIAGNOSIS — N2889 Other specified disorders of kidney and ureter: Secondary | ICD-10-CM | POA: Diagnosis present

## 2013-10-08 DIAGNOSIS — E861 Hypovolemia: Secondary | ICD-10-CM

## 2013-10-08 DIAGNOSIS — I252 Old myocardial infarction: Secondary | ICD-10-CM

## 2013-10-08 DIAGNOSIS — E860 Dehydration: Secondary | ICD-10-CM

## 2013-10-08 DIAGNOSIS — R4701 Aphasia: Secondary | ICD-10-CM

## 2013-10-08 DIAGNOSIS — I6529 Occlusion and stenosis of unspecified carotid artery: Secondary | ICD-10-CM

## 2013-10-08 DIAGNOSIS — E785 Hyperlipidemia, unspecified: Secondary | ICD-10-CM | POA: Diagnosis present

## 2013-10-08 DIAGNOSIS — N200 Calculus of kidney: Secondary | ICD-10-CM

## 2013-10-08 DIAGNOSIS — R609 Edema, unspecified: Secondary | ICD-10-CM | POA: Diagnosis present

## 2013-10-08 DIAGNOSIS — R142 Eructation: Secondary | ICD-10-CM | POA: Diagnosis not present

## 2013-10-08 DIAGNOSIS — E1165 Type 2 diabetes mellitus with hyperglycemia: Secondary | ICD-10-CM

## 2013-10-08 DIAGNOSIS — R4182 Altered mental status, unspecified: Secondary | ICD-10-CM

## 2013-10-08 DIAGNOSIS — I10 Essential (primary) hypertension: Secondary | ICD-10-CM

## 2013-10-08 DIAGNOSIS — Z515 Encounter for palliative care: Secondary | ICD-10-CM

## 2013-10-08 DIAGNOSIS — M79609 Pain in unspecified limb: Secondary | ICD-10-CM | POA: Diagnosis present

## 2013-10-08 DIAGNOSIS — Z5987 Material hardship due to limited financial resources, not elsewhere classified: Secondary | ICD-10-CM

## 2013-10-08 DIAGNOSIS — R933 Abnormal findings on diagnostic imaging of other parts of digestive tract: Secondary | ICD-10-CM

## 2013-10-08 DIAGNOSIS — J449 Chronic obstructive pulmonary disease, unspecified: Secondary | ICD-10-CM | POA: Diagnosis present

## 2013-10-08 DIAGNOSIS — Z7982 Long term (current) use of aspirin: Secondary | ICD-10-CM

## 2013-10-08 DIAGNOSIS — R195 Other fecal abnormalities: Secondary | ICD-10-CM

## 2013-10-08 DIAGNOSIS — R269 Unspecified abnormalities of gait and mobility: Secondary | ICD-10-CM

## 2013-10-08 DIAGNOSIS — R0789 Other chest pain: Secondary | ICD-10-CM

## 2013-10-08 DIAGNOSIS — I679 Cerebrovascular disease, unspecified: Secondary | ICD-10-CM

## 2013-10-08 DIAGNOSIS — Z951 Presence of aortocoronary bypass graft: Secondary | ICD-10-CM

## 2013-10-08 DIAGNOSIS — D490 Neoplasm of unspecified behavior of digestive system: Secondary | ICD-10-CM

## 2013-10-08 DIAGNOSIS — R509 Fever, unspecified: Secondary | ICD-10-CM | POA: Diagnosis present

## 2013-10-08 DIAGNOSIS — R599 Enlarged lymph nodes, unspecified: Secondary | ICD-10-CM

## 2013-10-08 DIAGNOSIS — R5383 Other fatigue: Secondary | ICD-10-CM

## 2013-10-08 DIAGNOSIS — Z9861 Coronary angioplasty status: Secondary | ICD-10-CM

## 2013-10-08 DIAGNOSIS — R5381 Other malaise: Secondary | ICD-10-CM | POA: Diagnosis present

## 2013-10-08 DIAGNOSIS — I2510 Atherosclerotic heart disease of native coronary artery without angina pectoris: Secondary | ICD-10-CM

## 2013-10-08 DIAGNOSIS — R569 Unspecified convulsions: Secondary | ICD-10-CM

## 2013-10-08 DIAGNOSIS — R141 Gas pain: Secondary | ICD-10-CM | POA: Diagnosis not present

## 2013-10-08 DIAGNOSIS — J4489 Other specified chronic obstructive pulmonary disease: Secondary | ICD-10-CM | POA: Diagnosis present

## 2013-10-08 DIAGNOSIS — I739 Peripheral vascular disease, unspecified: Secondary | ICD-10-CM | POA: Diagnosis present

## 2013-10-08 DIAGNOSIS — N189 Chronic kidney disease, unspecified: Secondary | ICD-10-CM

## 2013-10-08 DIAGNOSIS — R143 Flatulence: Secondary | ICD-10-CM

## 2013-10-08 DIAGNOSIS — I701 Atherosclerosis of renal artery: Secondary | ICD-10-CM | POA: Diagnosis present

## 2013-10-08 DIAGNOSIS — D649 Anemia, unspecified: Secondary | ICD-10-CM

## 2013-10-08 DIAGNOSIS — E869 Volume depletion, unspecified: Secondary | ICD-10-CM | POA: Diagnosis present

## 2013-10-08 DIAGNOSIS — D126 Benign neoplasm of colon, unspecified: Secondary | ICD-10-CM

## 2013-10-08 DIAGNOSIS — Z79899 Other long term (current) drug therapy: Secondary | ICD-10-CM

## 2013-10-08 DIAGNOSIS — Z598 Other problems related to housing and economic circumstances: Secondary | ICD-10-CM

## 2013-10-08 DIAGNOSIS — E875 Hyperkalemia: Secondary | ICD-10-CM

## 2013-10-08 DIAGNOSIS — N179 Acute kidney failure, unspecified: Principal | ICD-10-CM

## 2013-10-08 DIAGNOSIS — G8929 Other chronic pain: Secondary | ICD-10-CM | POA: Diagnosis present

## 2013-10-08 DIAGNOSIS — IMO0002 Reserved for concepts with insufficient information to code with codable children: Secondary | ICD-10-CM

## 2013-10-08 DIAGNOSIS — IMO0001 Reserved for inherently not codable concepts without codable children: Secondary | ICD-10-CM | POA: Diagnosis present

## 2013-10-08 DIAGNOSIS — W19XXXA Unspecified fall, initial encounter: Secondary | ICD-10-CM

## 2013-10-08 DIAGNOSIS — Z85038 Personal history of other malignant neoplasm of large intestine: Secondary | ICD-10-CM

## 2013-10-08 DIAGNOSIS — Z7902 Long term (current) use of antithrombotics/antiplatelets: Secondary | ICD-10-CM

## 2013-10-08 LAB — COMPREHENSIVE METABOLIC PANEL
ALBUMIN: 2.7 g/dL — AB (ref 3.5–5.2)
ALK PHOS: 74 U/L (ref 39–117)
ALT: 8 U/L (ref 0–53)
AST: 17 U/L (ref 0–37)
BUN: 39 mg/dL — ABNORMAL HIGH (ref 6–23)
CO2: 25 mEq/L (ref 19–32)
CREATININE: 2.95 mg/dL — AB (ref 0.50–1.35)
Calcium: 8.9 mg/dL (ref 8.4–10.5)
Chloride: 100 mEq/L (ref 96–112)
GFR calc Af Amer: 23 mL/min — ABNORMAL LOW (ref >90)
GFR calc non Af Amer: 20 mL/min — ABNORMAL LOW (ref >90)
Glucose, Bld: 182 mg/dL — ABNORMAL HIGH (ref 70–99)
POTASSIUM: 4.2 meq/L (ref 3.7–5.3)
Sodium: 138 mEq/L (ref 137–147)
TOTAL PROTEIN: 6.5 g/dL (ref 6.0–8.3)
Total Bilirubin: 0.4 mg/dL (ref 0.3–1.2)

## 2013-10-08 LAB — CBC WITH DIFFERENTIAL/PLATELET
BASOS ABS: 0 10*3/uL (ref 0.0–0.1)
BASOS PCT: 0 % (ref 0–1)
EOS ABS: 0.1 10*3/uL (ref 0.0–0.7)
Eosinophils Relative: 1 % (ref 0–5)
HCT: 33.4 % — ABNORMAL LOW (ref 39.0–52.0)
HEMOGLOBIN: 11.5 g/dL — AB (ref 13.0–17.0)
Lymphocytes Relative: 11 % — ABNORMAL LOW (ref 12–46)
Lymphs Abs: 1.3 10*3/uL (ref 0.7–4.0)
MCH: 28.4 pg (ref 26.0–34.0)
MCHC: 34.4 g/dL (ref 30.0–36.0)
MCV: 82.5 fL (ref 78.0–100.0)
MONOS PCT: 9 % (ref 3–12)
Monocytes Absolute: 1 10*3/uL (ref 0.1–1.0)
NEUTROS PCT: 79 % — AB (ref 43–77)
Neutro Abs: 9.3 10*3/uL — ABNORMAL HIGH (ref 1.7–7.7)
Platelets: 137 10*3/uL — ABNORMAL LOW (ref 150–400)
RBC: 4.05 MIL/uL — ABNORMAL LOW (ref 4.22–5.81)
RDW: 12.7 % (ref 11.5–15.5)
WBC: 11.7 10*3/uL — ABNORMAL HIGH (ref 4.0–10.5)

## 2013-10-08 LAB — URINALYSIS, ROUTINE W REFLEX MICROSCOPIC
Bilirubin Urine: NEGATIVE
Glucose, UA: 500 mg/dL — AB
Ketones, ur: NEGATIVE mg/dL
NITRITE: NEGATIVE
Protein, ur: >300 mg/dL — AB
SPECIFIC GRAVITY, URINE: 1.02 (ref 1.005–1.030)
Urobilinogen, UA: 0.2 mg/dL (ref 0.0–1.0)
pH: 5.5 (ref 5.0–8.0)

## 2013-10-08 LAB — URINE MICROSCOPIC-ADD ON

## 2013-10-08 LAB — GLUCOSE, CAPILLARY
GLUCOSE-CAPILLARY: 211 mg/dL — AB (ref 70–99)
Glucose-Capillary: 165 mg/dL — ABNORMAL HIGH (ref 70–99)

## 2013-10-08 LAB — HEMOGLOBIN A1C
Hgb A1c MFr Bld: 7.9 % — ABNORMAL HIGH (ref <5.7)
Mean Plasma Glucose: 180 mg/dL — ABNORMAL HIGH (ref <117)

## 2013-10-08 LAB — LIPASE, BLOOD: LIPASE: 14 U/L (ref 11–59)

## 2013-10-08 LAB — TROPONIN I: Troponin I: <0.3 ng/mL (ref <0.30)

## 2013-10-08 MED ORDER — LORAZEPAM 1 MG PO TABS
1.0000 mg | ORAL_TABLET | Freq: Every day | ORAL | Status: DC
  Administered 2013-10-08 – 2013-10-13 (×6): 1 mg via ORAL
  Filled 2013-10-08 (×6): qty 1

## 2013-10-08 MED ORDER — ACETAMINOPHEN 325 MG PO TABS
650.0000 mg | ORAL_TABLET | Freq: Once | ORAL | Status: AC
  Administered 2013-10-08: 650 mg via ORAL
  Filled 2013-10-08: qty 2

## 2013-10-08 MED ORDER — ZINC SULFATE 220 (50 ZN) MG PO CAPS
220.0000 mg | ORAL_CAPSULE | Freq: Every day | ORAL | Status: DC
  Administered 2013-10-08 – 2013-10-12 (×4): 220 mg via ORAL
  Filled 2013-10-08 (×5): qty 1

## 2013-10-08 MED ORDER — TOPIRAMATE 25 MG PO TABS
25.0000 mg | ORAL_TABLET | Freq: Two times a day (BID) | ORAL | Status: DC
  Administered 2013-10-09 – 2013-10-14 (×10): 25 mg via ORAL
  Filled 2013-10-08 (×13): qty 1

## 2013-10-08 MED ORDER — INSULIN ASPART 100 UNIT/ML ~~LOC~~ SOLN
0.0000 [IU] | Freq: Three times a day (TID) | SUBCUTANEOUS | Status: DC
  Administered 2013-10-08: 3 [IU] via SUBCUTANEOUS
  Administered 2013-10-09 (×2): 2 [IU] via SUBCUTANEOUS

## 2013-10-08 MED ORDER — ADULT MULTIVITAMIN W/MINERALS CH
1.0000 | ORAL_TABLET | Freq: Every day | ORAL | Status: DC
  Administered 2013-10-08 – 2013-10-12 (×4): 1 via ORAL
  Filled 2013-10-08 (×5): qty 1

## 2013-10-08 MED ORDER — LUBIPROSTONE 8 MCG PO CAPS
8.0000 ug | ORAL_CAPSULE | Freq: Two times a day (BID) | ORAL | Status: DC
  Administered 2013-10-08 – 2013-10-12 (×6): 8 ug via ORAL
  Filled 2013-10-08 (×11): qty 1

## 2013-10-08 MED ORDER — ENOXAPARIN SODIUM 30 MG/0.3ML ~~LOC~~ SOLN
30.0000 mg | SUBCUTANEOUS | Status: DC
  Administered 2013-10-08 – 2013-10-09 (×2): 30 mg via SUBCUTANEOUS
  Filled 2013-10-08 (×2): qty 0.3

## 2013-10-08 MED ORDER — SODIUM CHLORIDE 0.9 % IJ SOLN
3.0000 mL | Freq: Two times a day (BID) | INTRAMUSCULAR | Status: DC
  Administered 2013-10-12 – 2013-10-14 (×3): 3 mL via INTRAVENOUS

## 2013-10-08 MED ORDER — ASPIRIN EC 81 MG PO TBEC
81.0000 mg | DELAYED_RELEASE_TABLET | Freq: Every day | ORAL | Status: DC
  Administered 2013-10-08 – 2013-10-09 (×2): 81 mg via ORAL
  Filled 2013-10-08 (×2): qty 1

## 2013-10-08 MED ORDER — ALBUTEROL SULFATE (2.5 MG/3ML) 0.083% IN NEBU: 2.5000 mg | INHALATION_SOLUTION | Freq: Four times a day (QID) | RESPIRATORY_TRACT | Status: DC | PRN

## 2013-10-08 MED ORDER — SODIUM CHLORIDE 0.9 % IV SOLN
INTRAVENOUS | Status: DC
  Administered 2013-10-08: 13:00:00 via INTRAVENOUS

## 2013-10-08 MED ORDER — OXYCODONE-ACETAMINOPHEN 5-325 MG PO TABS
1.0000 | ORAL_TABLET | ORAL | Status: DC | PRN
  Administered 2013-10-10 – 2013-10-14 (×9): 1 via ORAL
  Filled 2013-10-08 (×9): qty 1

## 2013-10-08 MED ORDER — PIPERACILLIN-TAZOBACTAM IN DEX 2-0.25 GM/50ML IV SOLN
2.2500 g | Freq: Four times a day (QID) | INTRAVENOUS | Status: DC
  Administered 2013-10-08 – 2013-10-09 (×3): 2.25 g via INTRAVENOUS
  Filled 2013-10-08 (×4): qty 50

## 2013-10-08 MED ORDER — POLYETHYLENE GLYCOL 3350 17 G PO PACK
17.0000 g | PACK | Freq: Two times a day (BID) | ORAL | Status: DC
  Administered 2013-10-08 – 2013-10-10 (×2): 17 g via ORAL
  Filled 2013-10-08 (×5): qty 1

## 2013-10-08 MED ORDER — LEVETIRACETAM 500 MG PO TABS
500.0000 mg | ORAL_TABLET | Freq: Every day | ORAL | Status: DC
  Administered 2013-10-08 – 2013-10-14 (×6): 500 mg via ORAL
  Filled 2013-10-08 (×7): qty 1

## 2013-10-08 MED ORDER — ONDANSETRON HCL 4 MG PO TABS: 4.0000 mg | ORAL_TABLET | Freq: Four times a day (QID) | ORAL | Status: DC | PRN

## 2013-10-08 MED ORDER — SODIUM CHLORIDE 0.9 % IV BOLUS (SEPSIS)
500.0000 mL | Freq: Once | INTRAVENOUS | Status: AC
  Administered 2013-10-08: 500 mL via INTRAVENOUS

## 2013-10-08 MED ORDER — VITAMIN C 500 MG PO TABS
500.0000 mg | ORAL_TABLET | Freq: Two times a day (BID) | ORAL | Status: DC
  Administered 2013-10-08 – 2013-10-12 (×7): 500 mg via ORAL
  Filled 2013-10-08 (×9): qty 1

## 2013-10-08 MED ORDER — ESCITALOPRAM OXALATE 10 MG PO TABS
10.0000 mg | ORAL_TABLET | Freq: Every day | ORAL | Status: DC
  Administered 2013-10-08 – 2013-10-12 (×4): 10 mg via ORAL
  Filled 2013-10-08 (×5): qty 1

## 2013-10-08 MED ORDER — SODIUM CHLORIDE 0.9 % IV SOLN
INTRAVENOUS | Status: DC
  Administered 2013-10-08 – 2013-10-12 (×7): via INTRAVENOUS

## 2013-10-08 MED ORDER — FINASTERIDE 5 MG PO TABS
5.0000 mg | ORAL_TABLET | Freq: Every day | ORAL | Status: DC
  Administered 2013-10-08 – 2013-10-12 (×5): 5 mg via ORAL
  Filled 2013-10-08 (×5): qty 1

## 2013-10-08 MED ORDER — ONDANSETRON HCL 4 MG/2ML IJ SOLN
4.0000 mg | Freq: Four times a day (QID) | INTRAMUSCULAR | Status: DC | PRN
  Administered 2013-10-10 – 2013-10-11 (×3): 4 mg via INTRAVENOUS
  Filled 2013-10-08 (×3): qty 2

## 2013-10-08 MED ORDER — ACETAMINOPHEN 325 MG PO TABS
650.0000 mg | ORAL_TABLET | Freq: Four times a day (QID) | ORAL | Status: DC | PRN
  Administered 2013-10-08 – 2013-10-09 (×2): 650 mg via ORAL
  Filled 2013-10-08 (×2): qty 2

## 2013-10-08 MED ORDER — CLOPIDOGREL BISULFATE 75 MG PO TABS
75.0000 mg | ORAL_TABLET | Freq: Every day | ORAL | Status: DC
  Administered 2013-10-08 – 2013-10-09 (×2): 75 mg via ORAL
  Filled 2013-10-08 (×2): qty 1

## 2013-10-08 MED ORDER — ACETAMINOPHEN 650 MG RE SUPP: 650.0000 mg | Freq: Four times a day (QID) | RECTAL | Status: DC | PRN

## 2013-10-08 NOTE — ED Provider Notes (Signed)
CSN: [id number]     Arrival date & time 10/08/13  1051 History   First MD Initiated Contact with Patient 10/08/13 1105     Chief Complaint  Patient presents with  . Fall  . Weakness   (Consider location/radiation/quality/duration/timing/severity/associated sxs/prior Treatment) Patient is a 73 y.o. male presenting with fall and weakness. The history is provided by the patient and a relative.  Fall  Weakness   He is here for evaluation of a fall that occurred early this morning. His family members were able to get him up and help him walk, afterwards. He has been very weak since that time. He was at the hospital 2 days ago for lithotripsy for a right renal stone. He has had abdominal pain, for at least a month and had several ED evaluations. He has known gallstones. He is apparently taking an antibiotic, but the family left the home. There is no documentation in EPIC, regarding the medication. The patient cannot contribute a history.   Level 5 caveat- confusion      Past Medical History  Diagnosis Date  . Myocardial infarction   . CVA (cerebral infarction)   . Diabetes mellitus   . Hypertension   . Diabetes mellitus   . Aphasia   . Dysphagia   . COPD (chronic obstructive pulmonary disease)   . Shortness of breath   . Anemia   . Stroke   . Colon cancer   . Hemorrhoid   . Intractable seizures   . Pseudobulbar affect   . Dyslipidemia   . CAD (coronary artery disease)   . History of hemorrhoids    Past Surgical History  Procedure Laterality Date  . Coronary artery bypass graft    . Appendectomy    . Dental surgery    . Colonoscopy  03/13/2012    Procedure: COLONOSCOPY;  Surgeon: Lafayette Dragon, MD;  Location: [facility];  Service: Endoscopy;  Laterality: N/A;  . Partial colectomy  03/22/2012    Procedure: PARTIAL COLECTOMY;  Surgeon: Gwenyth Ober, MD;  Location: [place];  Service: General;  Laterality: Right;  . Coronary artery bypass graft      x3  . Coronary stent  placement     Family History  Problem Relation Age of Onset  . Breast cancer Mother   . Heart disease Brother   . Heart attack Brother   . Colon cancer Maternal Uncle   . Heart attack Father   . Heart attack Sister    History  Substance Use Topics  . Smoking status: Current Some Day Smoker -- 1.00 packs/day for 16 years    Types: Cigarettes  . Smokeless tobacco: Never Used  . Alcohol Use: No    Review of Systems  Unable to perform ROS Neurological: Positive for weakness.    Allergies  Review of patient's allergies indicates no known allergies.  Home Medications   Current Outpatient Rx  Name  Route  Sig  Dispense  Refill  . aspirin EC 81 MG tablet   Oral   Take 1 tablet (81 mg total) by mouth daily. For CVA prophylaxis.         Marland Kitchen clopidogrel (PLAVIX) 75 MG tablet   Oral   Take 1 tablet (75 mg total) by mouth daily.         Marland Kitchen escitalopram (LEXAPRO) 10 MG tablet   Oral   Take 10 mg by mouth daily.         . finasteride (PROSCAR) 5 MG tablet  Oral   Take 5 mg by mouth daily.         Marland Kitchen levETIRAcetam (KEPPRA) 500 MG tablet   Oral   Take 500 mg by mouth every 12 (twelve) hours.         Marland Kitchen lubiprostone (AMITIZA) 8 MCG capsule   Oral   Take 1 capsule (8 mcg total) by mouth 2 (two) times daily with a meal.   60 capsule   6   . Multiple Vitamin (MULITIVITAMIN WITH MINERALS) TABS   Oral   Take 1 tablet by mouth daily.         . nitrofurantoin, macrocrystal-monohydrate, (MACROBID) 100 MG capsule   Oral   Take 100 mg by mouth 2 (two) times daily.         . nitroGLYCERIN (NITROSTAT) 0.4 MG SL tablet   Sublingual   Place 0.4 mg under the tongue every 5 (five) minutes x 3 doses as needed. For chest pain         . oxazepam (SERAX) 30 MG capsule   Oral   Take 30 mg by mouth at bedtime.         Marland Kitchen oxyCODONE-acetaminophen (ROXICET) 5-325 MG per tablet   Oral   Take 1 tablet by mouth every 4 (four) hours as needed for severe pain.   30 tablet    0   . polyethylene glycol (MIRALAX) packet   Oral   Take 17 g by mouth 2 (two) times daily.   30 each   0   . promethazine (PHENERGAN) 25 MG tablet   Oral   Take 25 mg by mouth every 6 (six) hours as needed. For nausea.         . sitaGLIPtan-metformin (JANUMET) 50-1000 MG per tablet   Oral   Take 1 tablet by mouth 2 (two) times daily with a meal.         . topiramate (TOPAMAX) 25 MG tablet   Oral   Take 1 tablet (25 mg total) by mouth 2 (two) times daily.   60 tablet   3   . valsartan (DIOVAN) 160 MG tablet   Oral   Take 160 mg by mouth daily.         . vitamin C (ASCORBIC ACID) 500 MG tablet   Oral   Take 500 mg by mouth 2 (two) times daily.         Marland Kitchen zinc sulfate 220 MG capsule   Oral   Take 220 mg by mouth daily.         Marland Kitchen albuterol (PROVENTIL HFA;VENTOLIN HFA) 108 (90 BASE) MCG/ACT inhaler   Inhalation   Inhale 1-2 puffs into the lungs every 6 (six) hours as needed for wheezing.   1 Inhaler   0    BP 108/82  Pulse 99  Temp(Src) 101.2 F (38.4 C) (Oral)  Resp 16  Ht 5\' 4"  (1.626 m)  Wt 150 lb (68.04 kg)  BMI 25.73 kg/m2  SpO2 97% Physical Exam  Nursing note and vitals reviewed. Constitutional: He appears well-developed. No distress.  Elderly, frail  HENT:  Head: Normocephalic and atraumatic.  Right Ear: External ear normal.  Left Ear: External ear normal.  Eyes: Conjunctivae and EOM are normal. Pupils are equal, round, and reactive to light.  Neck: Normal range of motion and phonation normal. Neck supple.  Cardiovascular: Normal rate, regular rhythm, normal heart sounds and intact distal pulses.   Pulmonary/Chest: Effort normal and breath sounds normal. He exhibits no bony  tenderness.  Abdominal: Soft. Normal appearance. There is no tenderness.  Musculoskeletal: Normal range of motion.  Neurological: He is alert. No cranial nerve deficit or sensory deficit. He exhibits normal muscle tone. Coordination normal.  Global weakness. He cannot  sit up at all, without support. When lying supine, he is able to elevate both arms and legs easily off the stretcher. There is no evidence for focal asymmetry of strength. He answers questions by speaking in a soft voice, and nodding. He seems fairly lucid, and not sedated or lethargic.  Skin: Skin is warm, dry and intact.  Psychiatric: He has a normal mood and affect. His behavior is normal.    ED Course  Procedures (including critical care time)  Medications  0.9 %  sodium chloride infusion ( Intravenous Stopped 10/08/13 1349)  sodium chloride 0.9 % bolus 500 mL (500 mLs Intravenous New Bag/Given 10/08/13 1235)    Patient Vitals for the past 24 hrs:  BP Temp Temp src Pulse Resp SpO2 Height Weight  10/08/13 1355 108/82 mmHg 101.2 F (38.4 C) Oral 99 16 97 % - -  10/08/13 1233 134/59 mmHg 99.4 F (37.4 C) Oral 71 18 95 % - -  10/08/13 1101 120/57 mmHg 99.9 F (37.7 C) Oral 78 17 96 % 5\' 4"  (1.626 m) 150 lb (68.04 kg)  10/08/13 1052 129/70 mmHg 99.7 F (37.6 C) Oral 83 14 97 % - -    2:03 PM Reevaluation with update and discussion. After initial assessment and treatment, an updated evaluation reveals no change in his status. Findings discussed with family, questions answered. Porsche Noguchi L      Labs Review Labs Reviewed  CBC WITH DIFFERENTIAL - Abnormal; Notable for the following:    WBC 11.7 (*)    RBC 4.05 (*)    Hemoglobin 11.5 (*)    HCT 33.4 (*)    Platelets 137 (*)    Neutrophils Relative % 79 (*)    Neutro Abs 9.3 (*)    Lymphocytes Relative 11 (*)    All other components within normal limits  COMPREHENSIVE METABOLIC PANEL - Abnormal; Notable for the following:    Glucose, Bld 182 (*)    BUN 39 (*)    Creatinine, Ser 2.95 (*)    Albumin 2.7 (*)    GFR calc non Af Amer 20 (*)    GFR calc Af Amer 23 (*)    All other components within normal limits  URINE CULTURE  LIPASE, BLOOD  URINALYSIS, ROUTINE W REFLEX MICROSCOPIC   Imaging Review Dg Chest 2  View  10/08/2013   CLINICAL DATA:  Fall, weakness  EXAM: CHEST  2 VIEW  COMPARISON:  09/07/2013  FINDINGS: Cardiomediastinal silhouette is stable. No acute infiltrate or pleural effusion. No pulmonary edema. Status post CABG. Bony thorax is stable.  IMPRESSION: No active disease.  Status post CABG again noted.   Electronically Signed   By: Lahoma Crocker M.D.   On: 10/08/2013 13:14   Ct Head Wo Contrast  10/08/2013   CLINICAL DATA:  Weakness  EXAM: CT HEAD WITHOUT CONTRAST  TECHNIQUE: Contiguous axial images were obtained from the base of the skull through the vertex without intravenous contrast.  COMPARISON:  MRI 10/25/2012  FINDINGS: Chronic left MCA infarct involving the left insula and basal ganglia, unchanged from the prior study.  Negative for acute infarct. Negative for hemorrhage or mass. Mild atrophy and chronic microvascular ischemic changes in the white matter.  IMPRESSION: Chronic left MCA infarct.  No acute  abnormality.   Electronically Signed   By: Franchot Gallo M.D.   On: 10/08/2013 12:22    EKG Interpretation   None       MDM   1. Acute kidney injury   2. Fall   3. Weakness    Acute kidney injury with secondary weakness. Doubt urinary tract infection, culture ordered, metabolic instability, systemic bacterial infection or suspected impending vascular collapse. He will require admission for stabilization and treatment.  Nursing Notes Reviewed/ Care Coordinated, and agree without changes. Applicable Imaging Reviewed.  Interpretation of Laboratory Data incorporated into ED treatment  Plan: Admit  Richarda Blade, MD 10/08/13 [phone]

## 2013-10-08 NOTE — Progress Notes (Signed)
Utilization Review completed.  Edgard Debord RN CM  

## 2013-10-08 NOTE — H&P (Addendum)
[facility] History and Physical  [name] ENI:[id number] DOB: 1941/02/18 DOA: 10/08/2013   PCP: Stephens Shire, MD   Chief Complaint: Mechanical fall, generalized weakness  HPI:  73 year old male with a history of hypertension, diabetes mellitus, stroke, seizure disorder, COPD presents after a mechanical fall early in the morning on the day of admission. The patient's family tried to get him up but was unable to get the patient up. As a result, EMS was activated. There was no syncope. Even prior to the fall, the patient has had a history of generalized weakness. However he required a max assist today without success. The patient had lithotripsy on 10/06/2013 and was at home with Lynn. The patient is a poor historian. This history is obtained from review of the chart and speaking with EDP. The patient's wife at the bedside is also a poor historian. Apparently, the patient has been having chest discomfort and generalized weakness for the better part of the week. However, things have significantly worsened since his lithotripsy on 10/06/2013. There've been no reports of fevers, chills, shortness of breath, nausea, vomiting, diarrhea, dysuria, hematochezia, melena. The patient has chronic abdominal pain for which CT of the abdomen and pelvis on 10/03/2013 was obtained. 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 After lithotripsy, the patient was sent home with Macrobid. He endorses compliance.  In the ED, the patient was noted to have a serum creatinine 2.95. WBC was 11.7. Urinalysis shows 21-50 WBCs.  The patient had a fever on 101.14F with a soft blood pressures. The patient was given 1 L of fluid. CT brain negative for acute findings. Chest x-ray negative for infiltrate Assessment/Plan: Acute on chronic renal failure (CKD stage III) -Likely volume depletion with a history of decreased po intake -Renal ultrasound -IV  fluids -Baseline creatinine 1.0-1.3 Generalized weakness -Multifactorial including possible UTI as well as acute on chronic renal failure -PT evaluation Fever -Blood cultures x2 sets -Urine culture has been sent -Empiric Zosyn given the patient's recent hospitalization and lithotripsy -Influenza pcr Hypertension -Discontinue Diovan as blood pressure is soft and with renal failure -In light of the patient's renal artery stenosis, will not likely restart Diovan Diabetes mellitus type 2 -NovoLog sliding scale -Hemoglobin A1c -Discontinue metformin CAD/history of stroke -Continue Plavix and aspirin Seizure disorder -Continue Topamax and Keppra -Toprol has been decreased to once daily due to the patient's renal failure Abdominal pain/Atypical chest pain -unclear if pt truly has cp -cycle troponins -2-view abd xray RUE pain and edema -venous duplex r/o DVT     Past Medical History  Diagnosis Date  . Myocardial infarction   . CVA (cerebral infarction)   . Diabetes mellitus   . Hypertension   . Diabetes mellitus   . Aphasia   . Dysphagia   . COPD (chronic obstructive pulmonary disease)   . Shortness of breath   . Anemia   . Stroke   . Colon cancer   . Hemorrhoid   . Intractable seizures   . Pseudobulbar affect   . Dyslipidemia   . CAD (coronary artery disease)   . History of hemorrhoids    Past Surgical History  Procedure Laterality Date  . Coronary artery bypass graft    . Appendectomy    . Dental surgery    . Colonoscopy  03/13/2012    Procedure: COLONOSCOPY;  Surgeon: Lafayette Dragon, MD;  Location: [facility];  Service: Endoscopy;  Laterality: N/A;  . Partial colectomy  03/22/2012    Procedure: PARTIAL COLECTOMY;  Surgeon: Gwenyth Ober, MD;  Location: [place];  Service: General;  Laterality: Right;  . Coronary artery bypass graft      x3  . Coronary stent placement     Social History:  reports that he has been smoking Cigarettes.  He has a 16 pack-year  smoking history. He has never used smokeless tobacco. He reports that he does not drink alcohol or use illicit drugs.   Family History  Problem Relation Age of Onset  . Breast cancer Mother   . Heart disease Brother   . Heart attack Brother   . Colon cancer Maternal Uncle   . Heart attack Father   . Heart attack Sister      No Known Allergies    Prior to Admission medications   Medication Sig Start Date End Date Taking? Authorizing Provider  aspirin EC 81 MG tablet Take 1 tablet (81 mg total) by mouth daily. For CVA prophylaxis. 10/08/13  Yes Fredricka Bonine, MD  clopidogrel (PLAVIX) 75 MG tablet Take 1 tablet (75 mg total) by mouth daily. 10/10/13  Yes Fredricka Bonine, MD  escitalopram (LEXAPRO) 10 MG tablet Take 10 mg by mouth daily.   Yes Historical Provider, MD  finasteride (PROSCAR) 5 MG tablet Take 5 mg by mouth daily. 08/21/13  Yes Historical Provider, MD  levETIRAcetam (KEPPRA) 500 MG tablet Take 500 mg by mouth every 12 (twelve) hours.   Yes Historical Provider, MD  lubiprostone (AMITIZA) 8 MCG capsule Take 1 capsule (8 mcg total) by mouth 2 (two) times daily with a meal. 05/01/13  Yes Jerene Bears, MD  Multiple Vitamin (MULITIVITAMIN WITH MINERALS) TABS Take 1 tablet by mouth daily.   Yes Historical Provider, MD  nitrofurantoin, macrocrystal-monohydrate, (MACROBID) 100 MG capsule Take 100 mg by mouth 2 (two) times daily. 08/22/13  Yes Historical Provider, MD  nitroGLYCERIN (NITROSTAT) 0.4 MG SL tablet Place 0.4 mg under the tongue every 5 (five) minutes x 3 doses as needed. For chest pain   Yes Historical Provider, MD  oxazepam (SERAX) 30 MG capsule Take 30 mg by mouth at bedtime.   Yes Historical Provider, MD  oxyCODONE-acetaminophen (ROXICET) 5-325 MG per tablet Take 1 tablet by mouth every 4 (four) hours as needed for severe pain. 10/06/13  Yes Fredricka Bonine, MD  polyethylene glycol [facility]) packet Take 17 g by mouth 2 (two) times daily. 10/27/12  Yes  Modena Jansky, MD  promethazine (PHENERGAN) 25 MG tablet Take 25 mg by mouth every 6 (six) hours as needed. For nausea.   Yes Historical Provider, MD  sitaGLIPtan-metformin (JANUMET) 50-1000 MG per tablet Take 1 tablet by mouth 2 (two) times daily with a meal.   Yes Historical Provider, MD  topiramate (TOPAMAX) 25 MG tablet Take 1 tablet (25 mg total) by mouth 2 (two) times daily. 01/16/13  Yes Kathrynn Ducking, MD  valsartan (DIOVAN) 160 MG tablet Take 160 mg by mouth daily.   Yes Historical Provider, MD  vitamin C (ASCORBIC ACID) 500 MG tablet Take 500 mg by mouth 2 (two) times daily.   Yes Historical Provider, MD  zinc sulfate 220 MG capsule Take 220 mg by mouth daily.   Yes Historical Provider, MD  albuterol (PROVENTIL HFA;VENTOLIN HFA) 108 (90 BASE) MCG/ACT inhaler Inhale 1-2 puffs into the lungs every 6 (six) hours as needed for wheezing. 07/29/12   Janne Napoleon, NP    Review of Systems:  Constitutional:  No  weight loss, night sweats,  Head&Eyes: No headache.  No vision loss.  No eye pain or scotoma ENT:  No Difficulty swallowing,Tooth/dental problems,Sore throat,   Cardio-vascular:  No  swelling in lower extremities,  dizziness, palpitations  GI:  No nausea, vomiting, diarrhea, loss of appetite, hematochezia, melena, heartburn, indigestion, Resp:  No shortness of breath with exertion or at rest. No cough. No coughing up of blood .No wheezing. Skin:  no rash or lesions.  GU:  no dysuria, change in color of urine, no urgency or frequency. No flank pain.  Musculoskeletal:  No joint pain or swelling. No decreased range of motion. No back pain.  Psych:  No change in mood or affect.  Neurologic: No headache, no dysesthesia, no focal weakness, no vision loss. No syncope  Physical Exam: Filed Vitals:   10/08/13 1101 10/08/13 1233 10/08/13 1355 10/08/13 1457  BP: 120/57 134/59 108/82 117/48  Pulse: 78 71 99 82  Temp: 99.9 F (37.7 C) 99.4 F (37.4 C) 101.2 F (38.4 C) 101.8 F  (38.8 C)  TempSrc: Oral Oral Oral Oral  Resp: 17 18 16    Height: 5\' 4"  (1.626 m)     Weight: 68.04 kg (150 lb)     SpO2: 96% 95% 97% 96%   General:  Awake and alert, NAD, nontoxic, pleasant/cooperative Head/Eye: No conjunctival hemorrhage, no icterus, [place]/AT, No nystagmus ENT:  No icterus,  No thrush,no pharyngeal exudate Neck:  No masses, no lymphadenpathy,  CV:  RRR, no rub, no gallop, no S3 Lung:  Bibasilar crackles, right greater than left. No wheezing. Good air movement. Abdomen: soft/RLQ,, LLQ tender without rebound or guarding, +BS, nondistended, no peritoneal signs Ext: No cyanosis, No rashes, No petechiae, No lymphangitis, No edema Neuro: CNII-XII intact, strength 4/5 in bilateral upper and lower extremities, no dysmetria  Labs on Admission:  Basic Metabolic Panel:  Recent Labs Lab 10/03/13 0620 10/08/13 1230  NA 138 138  K 3.9 4.2  CL 99 100  CO2 26 25  GLUCOSE 198* 182*  BUN 15 39*  CREATININE 1.08 2.95*  CALCIUM 9.1 8.9   Liver Function Tests:  Recent Labs Lab 10/03/13 0620 10/08/13 1230  AST 10 17  ALT 8 8  ALKPHOS 84 74  BILITOT 0.4 0.4  PROT 6.0 6.5  ALBUMIN 2.8* 2.7*    Recent Labs Lab 10/03/13 0620 10/08/13 1230  LIPASE 17 14   No results found for this basename: AMMONIA,  in the last 168 hours CBC:  Recent Labs Lab 10/03/13 0620 10/08/13 1230  WBC 7.6 11.7*  NEUTROABS 5.8 9.3*  HGB 13.0 11.5*  HCT 37.8* 33.4*  MCV 81.8 82.5  PLT 128* 137*   Cardiac Enzymes: No results found for this basename: CKTOTAL, CKMB, CKMBINDEX, TROPONINI,  in the last 168 hours BNP: No components found with this basename: POCBNP,  CBG:  Recent Labs Lab 10/06/13 1032  GLUCAP 172*    Radiological Exams on Admission: Dg Chest 2 View  10/08/2013   CLINICAL DATA:  Fall, weakness  EXAM: CHEST  2 VIEW  COMPARISON:  09/07/2013  FINDINGS: Cardiomediastinal silhouette is stable. No acute infiltrate or pleural effusion. No pulmonary edema. Status post  CABG. Bony thorax is stable.  IMPRESSION: No active disease.  Status post CABG again noted.   Electronically Signed   By: Lahoma Crocker M.D.   On: 10/08/2013 13:14   Ct Head Wo Contrast  10/08/2013   CLINICAL DATA:  Weakness  EXAM: CT HEAD WITHOUT CONTRAST  TECHNIQUE: Contiguous axial  images were obtained from the base of the skull through the vertex without intravenous contrast.  COMPARISON:  MRI 10/25/2012  FINDINGS: Chronic left MCA infarct involving the left insula and basal ganglia, unchanged from the prior study.  Negative for acute infarct. Negative for hemorrhage or mass. Mild atrophy and chronic microvascular ischemic changes in the white matter.  IMPRESSION: Chronic left MCA infarct.  No acute abnormality.   Electronically Signed   By: Franchot Gallo M.D.   On: 10/08/2013 12:22    EKG: Independently reviewed. pending    Time spent:70 minutes Code Status:   Presumed full Family Communication:   wife at bedside   Adyson Vanburen, DO  [facility] Pager [phone]  If 7PM-7AM, please contact night-coverage [email or web address] Password [email or web address] 10/08/2013, 3:13 PM

## 2013-10-08 NOTE — ED Notes (Signed)
Pt was in hospital for kidney stone until Monday and has been weak ever since.  Pt is more weak today, this is a bilateral weakness.  Pt fells at 3am, he "landed on his bottom", did not hit his head or have any LOC.  Pt with generalized weaknessNo other signs of illness.  Pt is at his own baseline per family

## 2013-10-08 NOTE — ED Notes (Signed)
Bed: WA08 Expected date:  Expected time:  Means of arrival:  Comments: 

## 2013-10-08 NOTE — Progress Notes (Signed)
   CARE MANAGEMENT ED NOTE 10/08/2013  Patient:  [name], [name]   Account Number:  [id number]  Date Initiated:  10/08/2013  Documentation initiated by:  Livia Snellen  Subjective/Objective Assessment:   Patient presents to Ed post fall at home.     Subjective/Objective Assessment Detail:   73 year old male with a history of hypertension, diabetes mellitus, stroke, seizure disorder, COPD.  BUN 29, creat 2.95     Action/Plan:   Action/Plan Detail:   Patient to be admitted.   Anticipated DC Date:       Status Recommendation to Physician:   Result of Recommendation:    Other ED Services  Consult [place]  Other    Choice offered to / List presented to:            Status of service:  Completed, signed off  ED Comments:   ED Comments Detail:  EDCM spoke to patient and his wife at bedside.  Patient's wife states, "You can call me [name]."  [name]'s phone number [phone].  Patient lives at home with his wife.  He has a walker and a shower chair at home.  Patient reports he is currently receiving home health services from [place].  As per patient, he currently has a Chartered certified accountant and PT.  Patient does not have any othe home health services at this time.  Patient's wife helps patient with his ADL's. EDCM notified Colletta Maryland from [facility] of patient's admission. No further EDCM needs at this time.

## 2013-10-08 NOTE — ED Notes (Signed)
I&O cath using sterile technique.  Pt has concentrated urine.  Drained 450cc from bladder and changed wet depend, placed pt in fresh one

## 2013-10-09 ENCOUNTER — Inpatient Hospital Stay (HOSPITAL_COMMUNITY): Payer: Medicare Other

## 2013-10-09 DIAGNOSIS — M79609 Pain in unspecified limb: Secondary | ICD-10-CM

## 2013-10-09 DIAGNOSIS — J449 Chronic obstructive pulmonary disease, unspecified: Secondary | ICD-10-CM

## 2013-10-09 DIAGNOSIS — M7989 Other specified soft tissue disorders: Secondary | ICD-10-CM

## 2013-10-09 LAB — COMPREHENSIVE METABOLIC PANEL
ALT: 7 U/L (ref 0–53)
AST: 12 U/L (ref 0–37)
Albumin: 2.1 g/dL — ABNORMAL LOW (ref 3.5–5.2)
Alkaline Phosphatase: 60 U/L (ref 39–117)
BILIRUBIN TOTAL: 0.6 mg/dL (ref 0.3–1.2)
BUN: 38 mg/dL — AB (ref 6–23)
CO2: 24 mEq/L (ref 19–32)
Calcium: 7.7 mg/dL — ABNORMAL LOW (ref 8.4–10.5)
Chloride: 103 mEq/L (ref 96–112)
Creatinine, Ser: 2.89 mg/dL — ABNORMAL HIGH (ref 0.50–1.35)
GFR calc Af Amer: 24 mL/min — ABNORMAL LOW (ref >90)
GFR calc non Af Amer: 20 mL/min — ABNORMAL LOW (ref >90)
GLUCOSE: 167 mg/dL — AB (ref 70–99)
POTASSIUM: 3.8 meq/L (ref 3.7–5.3)
SODIUM: 138 meq/L (ref 137–147)
Total Protein: 5.2 g/dL — ABNORMAL LOW (ref 6.0–8.3)

## 2013-10-09 LAB — URINE CULTURE
Colony Count: NO GROWTH
Culture: NO GROWTH

## 2013-10-09 LAB — INFLUENZA PANEL BY PCR (TYPE A & B)
H1N1 flu by pcr: NOT DETECTED
Influenza A By PCR: NEGATIVE
Influenza B By PCR: NEGATIVE

## 2013-10-09 LAB — GLUCOSE, CAPILLARY
Glucose-Capillary: 169 mg/dL — ABNORMAL HIGH (ref 70–99)
Glucose-Capillary: 199 mg/dL — ABNORMAL HIGH (ref 70–99)
Glucose-Capillary: 174 mg/dL — ABNORMAL HIGH (ref 70–99)
Glucose-Capillary: 121 mg/dL — ABNORMAL HIGH (ref 70–99)

## 2013-10-09 LAB — TROPONIN I: Troponin I: <0.3 ng/mL (ref <0.30)

## 2013-10-09 MED ORDER — PIPERACILLIN-TAZOBACTAM 3.375 G IVPB
3.3750 g | Freq: Three times a day (TID) | INTRAVENOUS | Status: DC
  Administered 2013-10-09 – 2013-10-12 (×11): 3.375 g via INTRAVENOUS
  Filled 2013-10-09 (×11): qty 50

## 2013-10-09 MED ORDER — IOHEXOL 300 MG/ML  SOLN
25.0000 mL | INTRAMUSCULAR | Status: AC
  Administered 2013-10-09 (×2): 25 mL via ORAL

## 2013-10-09 MED ORDER — INSULIN ASPART 100 UNIT/ML ~~LOC~~ SOLN
0.0000 [IU] | Freq: Three times a day (TID) | SUBCUTANEOUS | Status: DC
  Administered 2013-10-09 – 2013-10-10 (×3): 3 [IU] via SUBCUTANEOUS
  Administered 2013-10-10 – 2013-10-11 (×2): 2 [IU] via SUBCUTANEOUS

## 2013-10-09 NOTE — Progress Notes (Signed)
Inpatient Diabetes Program Recommendations  AACE/ADA: New Consensus Statement on Inpatient Glycemic Control (2013)  Target Ranges:  Prepandial:   less than 140 mg/dL      Peak postprandial:   less than 180 mg/dL (1-2 hours)      Critically ill patients:  140 - 180 mg/dL   Reason for Visit: Results for CAROLL, CUNNINGTON (MRN [id number]) as of 10/09/2013 12:24  Ref. Range 10/08/2013 17:25 10/08/2013 21:56 10/09/2013 07:58 10/09/2013 11:52  Glucose-Capillary Latest Range: 70-99 mg/dL 211 (H) 165 (H) 169 (H) 199 (H)   Note History of Type 2 Diabetes.  Home medications include: Janumet 50/1000 mg bid.  Oral medications currently held. Note elevated BUN and Creatanine.  May consider Levemir 8 units q HS, if CBG's greater than 180 mg/dL.   Thanks, Adah Perl, RN, BC-ADM Inpatient Diabetes Coordinator Pager [phone]

## 2013-10-09 NOTE — Progress Notes (Signed)
*  Preliminary Results* Right upper extremity venous duplex completed. Right upper extremity is negative for deep and superficial vein thrombosis.  10/09/2013 1:51 PM  Maudry Mayhew, RVT, RDCS, RDMS

## 2013-10-09 NOTE — Progress Notes (Signed)
Pt CT scan in the abdomen done, Result showed Sub capsular hematoma on R Kidney. MD informed. No new orders. Continue to monitor.

## 2013-10-09 NOTE — Progress Notes (Signed)
[facility] PROGRESS NOTE  [name] OZD:[id number] DOB: 1940/10/22 DOA: 10/08/2013 PCP: Stephens Shire, MD  Assessment/Plan: Acute on chronic renal failure (CKD stage III)  -Likely volume depletion with a history of decreased po intake  -Renal ultrasound--negative for hydronephrosis, right renal hyperechogenicity region - continue IV fluids  -Baseline creatinine 1.0-1.3  Generalized weakness  -Multifactorial including possible UTI as well as acute on chronic renal failure  -PT evaluation-->HHPT  Fever  -Blood cultures x2 sets  -Urine culture--no growth -Empiric Zosyn given the patient's recent hospitalization and lithotripsy  -Influenza pcr--negative -CT abdomen and pelvis  Hypertension  -Discontinue Diovan as blood pressure is soft and with renal failure  -In light of the patient's renal artery stenosis, will not likely restart Diovan  Diabetes mellitus type 2  -NovoLog sliding scale--change to moderate scale -Hemoglobin A1c--7.9  -Discontinue metformin  CAD/history of stroke  -Continue Plavix and aspirin  Seizure disorder  -Continue Topamax and Keppra  -Keppra has been decreased to once daily due to the patient's renal failure  Abdominal pain/Atypical chest pain  -unclear if pt truly has cp  -cycle troponins--neg x 3 -2-view abd xray--no bowel obstruction  RUE pain and edema  -venous duplex--negative  Family Communication:   Wife at beside Disposition Plan:   Home when medically stable  Antibiotics:  Zosyn 10/09/13>>>    Procedures/Studies: Dg Chest 2 View  10/08/2013   CLINICAL DATA:  Fall, weakness  EXAM: CHEST  2 VIEW  COMPARISON:  09/07/2013  FINDINGS: Cardiomediastinal silhouette is stable. No acute infiltrate or pleural effusion. No pulmonary edema. Status post CABG. Bony thorax is stable.  IMPRESSION: No active disease.  Status post CABG again noted.   Electronically Signed   By: Lahoma Crocker M.D.   On: 10/08/2013 13:14   Dg Abd 1  View  10/06/2013   CLINICAL DATA:  Preoperative lithotripsy ; right-sided calculus  EXAM: ABDOMEN - 1 VIEW  COMPARISON:  CT abdomen and pelvis October 03, 2013  FINDINGS: There is a calcification in the region of the right renal pelvis measuring 1.4 x 1.1 cm. There are vascular calcifications pelvis. There is postoperative change in the right abdomen. Bowel gas pattern is within normal limits.  IMPRESSION: 1.4 x 1.1 cm calculus in the region of the right renal pelvis. Bowel gas pattern unremarkable.   Electronically Signed   By: Lowella Grip M.D.   On: 10/06/2013 10:07   Ct Head Wo Contrast  10/08/2013   CLINICAL DATA:  Weakness  EXAM: CT HEAD WITHOUT CONTRAST  TECHNIQUE: Contiguous axial images were obtained from the base of the skull through the vertex without intravenous contrast.  COMPARISON:  MRI 10/25/2012  FINDINGS: Chronic left MCA infarct involving the left insula and basal ganglia, unchanged from the prior study.  Negative for acute infarct. Negative for hemorrhage or mass. Mild atrophy and chronic microvascular ischemic changes in the white matter.  IMPRESSION: Chronic left MCA infarct.  No acute abnormality.   Electronically Signed   By: Franchot Gallo M.D.   On: 10/08/2013 12:22   Ct Abdomen Pelvis W Contrast  10/03/2013   CLINICAL DATA:  Abdominal pain, no bowel movement for 4 days  EXAM: CT ABDOMEN AND PELVIS WITH CONTRAST  TECHNIQUE: Multidetector CT imaging of the abdomen and pelvis was performed using the standard protocol following bolus administration of intravenous contrast.  CONTRAST:  13mL OMNIPAQUE IOHEXOL 300 MG/ML  SOLN  COMPARISON:  Abdominal radiograph 09/25/2013; prior CT abdomen/ pelvis 09/07/2013  FINDINGS: Lower Chest: Dependent  atelectasis in the lower lobes. Visualized heart is within normal limits for size. No pericardial effusion. Atherosclerotic calcifications noted in the coronary arteries. Mild calcification of the aortic valve annulus. Unremarkable distal thoracic  esophagus.  Abdomen: Unremarkable CT appearance of the stomach, duodenum, spleen, pancreas and left adrenal gland. Stable 1.5 cm low-attenuation nodule arising from the lateral limb of the right adrenal gland dating back to June of 2013. Stability is suggestive of a benign process such as adenoma. Normal hepatic contour and morphology. There is a single 8 mm enhancing nodule in the dome of the right liver (image 9 series 2) which is not identified on prior imaging although this may be secondary to differences in contrast timing.  Cholelithiasis without secondary signs to suggest acute cholecystitis. No intra or extrahepatic biliary ductal dilatation.  Stable 11 mm stone in the right renal pelvis. This is nonobstructing. A smaller 3 mm stone is noted in the right lower pole. No calculi in the left kidney. Numerous bilateral renal cortical cysts are unchanged in size, number and configuration.  There are a few scattered colonic diverticula. No evidence of active inflammation to suggest acute diverticulitis. Surgical changes of prior appendectomy and partial see connecting the. The ileal colonic anastomosis is widely patent. The descending and sigmoid colon are decompressed. There is only a moderate stool burden in the ascending and transverse colon. No free fluid or suspicious adenopathy.  Pelvis: Prostatomegaly. Prostate gland measures 5.3 x 5.0 cm and indents the bladder base. The bladder is distended with urine, however there is diffuse thickening of the bladder wall as well as a suggestion of trabeculation and increased enhancement. Findings are similar to the prior CT scan from 09/07/2013.  Bones/Soft Tissues: No acute fracture or aggressive appearing lytic or blastic osseous lesion. Multilevel degenerative disc disease and right worse than left lower lumbar facet arthropathy.  Vascular: Extensive atherosclerotic vascular disease. No aneurysmal dilatation. There is a moderate to high-grade stenosis of at the  origin of the a right main renal artery. Likely mild-to-moderate stenosis at the origin of the left renal artery. Irregular atherosclerotic plaque noted in the infrarenal abdominal aorta. There is evidence of mild plaque ulceration as well. Irregular noncalcified fibro fatty plaque noted in the SMA approximately 3 cm from the origin results in at least moderate narrowing.  IMPRESSION: 1. No evidence of obstruction, or significant constipation/impaction. The overall stool burden is relatively low and predominantly in the ascending and transverse colon. The descending and sigmoid colons are decompressed. 2. Extensive atherosclerotic vascular disease including probable bilateral (right greater than left) renal artery stenoses, SMA stenosis, ulcerated plaque in the infrarenal abdominal aorta and coronary artery disease. 3. Irregular bladder wall thickening and enhancement similar to slightly improved compared to 09/07/2013. This may reflect recurrent or chronic cystitis. Additionally, an element of bladder outlet obstruction is suggested by prostatomegaly and likely trabeculation of the bladder wall. 4. An 8 mm arterially enhancing lesion is identified in the hepatic dome. This was not clearly seen on prior studies. This could simply represent a flash fill capillary hemangioma, or transient hepatic attenuation difference which was not seen on prior imaging dated differences in contrast timing. However, given the patient's history of colon cancer and early metastatic lesion is not excluded. Recommend further evaluation with MRI of the abdomen with and without contrast in 3 months. 5. Nonobstructing right renal calculi. 6. Additional ancillary findings as above.   Electronically Signed   By: Jacqulynn Cadet M.D.   On: 10/03/2013 08:12  US Renal  10/08/2013   CLINICAL DATA:  Right-sided lithotripsy 10/06/13. Acute on chronic renal failure.  EXAM: RENAL/URINARY TRACT ULTRASOUND COMPLETE  COMPARISON:  DG ABD 2 VIEWS  dated 10/08/2013; CT ABD/PELVIS W CM dated 10/03/2013; US ABDOMEN COMPLETE dated 09/08/2013  FINDINGS: Right Kidney: 12.4 cm. No hydronephrosis. Normal renal cortical thickness and echogenicity. An area of hyper echogenicity which measures 8.8 cm along the lateral aspect of the interpolar right kidney is without correlate on the recent CT.  A minimally complex cyst measures 2.9 cm in the upper pole right kidney.  Left Kidney: 12.7 cm. No hydronephrosis. Normal renal cortical thickness and echogenicity. Upper pole complex cyst measures 2.8 cm. An interpolar lateral cyst or complex cyst measures 1.4 cm.  Bladder: Wall thickening. This is likely secondary to prostatomegaly. Debris within the dependent bladder.  IMPRESSION: 1. No hydronephrosis or other explanation for renal insufficiency. 2. Peripheral right interpolar renal area of hyperechogenicity. No correlate on the recent CT. This could be artifact or represent an area of infection or perirenal hematoma. Depending on clinical symptomatology, followup with CT should be considered. 3. Bilateral renal lesions, most consistent with cysts of varying complexity. 4. Prostatomegaly with likely secondary bladder outlet obstruction. Debris within the dependent bladder for which superimposed infection cannot be excluded.   Electronically Signed   By: Abigail Miyamoto M.D.   On: 10/08/2013 19:46   Dg Abd 2 Views  10/08/2013   CLINICAL DATA:  Abdominal pain and fever.  EXAM: ABDOMEN - 2 VIEW  COMPARISON:  10/06/2013  FINDINGS: There is no free air or free fluid in the abdomen. The bowel gas pattern is normal. The right renal stone seen on the prior study and is now fragmented. There appear to be multiple stone fragments in the renal pelvis as well as a small fragment in the lower pole the right kidney. No discrete fragments along the course of the right ureter.  Vascular calcifications are present in the pelvis.  IMPRESSION: Multiple fragments of the right renal stone are seen  in the region of the right renal pelvis as described. Otherwise benign appearing abdomen.   Electronically Signed   By: Rozetta Nunnery M.D.   On: 10/08/2013 18:12         Subjective: Patient intermittently complains of abdominal pain. Denies any headache, chest pain, shortness breath, vomiting, diarrhea, dysuria. He is able to produce urine and urinate.  Objective: Filed Vitals:   10/08/13 2140 10/09/13 0118 10/09/13 0510 10/09/13 1522  BP: 160/70  120/50 138/62  Pulse: 71  62 77  Temp: 101 F (38.3 C) 99.5 F (37.5 C) 99.1 F (37.3 C) 100.5 F (38.1 C)  TempSrc: Oral Oral Oral Oral  Resp: 18  16 20   Height:      Weight:      SpO2: 96%  97% 99%    Intake/Output Summary (Last 24 hours) at 10/09/13 1639 Last data filed at 10/09/13 1523  Gross per 24 hour  Intake   2375 ml  Output    100 ml  Net   2275 ml   Weight change:  Exam:   General:  Pt is alert, follows commands appropriately, not in acute distress  HEENT: No icterus, No thrush,  [place]/AT  Cardiovascular: RRR, S1/S2, no rubs, no gallops  Respiratory: Bibasilar crackles. No wheezing good air movement.   Abdomen: Soft/+BS, RUQ and suprapubic pain without rebound tenderness, non distended, no guarding  Extremities: 1+LE edema, No lymphangitis, No petechiae, No rashes, no synovitis  Data Reviewed: Basic Metabolic Panel:  Recent Labs Lab 10/03/13 0620 10/08/13 1230 10/09/13 0533  NA 138 138 138  K 3.9 4.2 3.8  CL 99 100 103  CO2 26 25 24   GLUCOSE 198* 182* 167*  BUN 15 39* 38*  CREATININE 1.08 2.95* 2.89*  CALCIUM 9.1 8.9 7.7*   Liver Function Tests:  Recent Labs Lab 10/03/13 0620 10/08/13 1230 10/09/13 0533  AST 10 17 12   ALT 8 8 7   ALKPHOS 84 74 60  BILITOT 0.4 0.4 0.6  PROT 6.0 6.5 5.2*  ALBUMIN 2.8* 2.7* 2.1*    Recent Labs Lab 10/03/13 0620 10/08/13 1230  LIPASE 17 14   No results found for this basename: AMMONIA,  in the last 168 hours CBC:  Recent Labs Lab 10/03/13 0620  10/08/13 1230  WBC 7.6 11.7*  NEUTROABS 5.8 9.3*  HGB 13.0 11.5*  HCT 37.8* 33.4*  MCV 81.8 82.5  PLT 128* 137*   Cardiac Enzymes:  Recent Labs Lab 10/08/13 1830 10/08/13 2330 10/09/13 0533  TROPONINI <0.30 <0.30 <0.30   BNP: No components found with this basename: POCBNP,  CBG:  Recent Labs Lab 10/06/13 1032 10/08/13 1725 10/08/13 2156 10/09/13 0758 10/09/13 1152  GLUCAP 172* 211* 165* 169* 199*    Recent Results (from the past 240 hour(s))  URINE CULTURE     Status: None   Collection Time    10/03/13  5:46 AM      Result Value Range Status   Specimen Description URINE, CLEAN CATCH   Final   Special Requests NONE   Final   Culture  Setup Time     Final   Value: 10/03/2013 08:58     Performed at [facility]     Final   Value: 75,000 COLONIES/ML     Performed at [facility]   Culture     Final   Value: Multiple bacterial morphotypes present, none predominant. Suggest appropriate recollection if clinically indicated.     Performed at [facility]   Report Status 10/04/2013 FINAL   Final  URINE CULTURE     Status: None   Collection Time    10/08/13 12:48 PM      Result Value Range Status   Specimen Description URINE, CATHETERIZED   Final   Special Requests NONE   Final   Culture  Setup Time     Final   Value: 10/08/2013 17:45     Performed at [facility]     Final   Value: NO GROWTH     Performed at [facility]   Culture     Final   Value: NO GROWTH     Performed at [facility]   Report Status 10/09/2013 FINAL   Final     Scheduled Meds: . aspirin EC  81 mg Oral Daily  . clopidogrel  75 mg Oral Daily  . enoxaparin (LOVENOX) injection  30 mg Subcutaneous Q24H  . escitalopram  10 mg Oral Daily  . finasteride  5 mg Oral Daily  . insulin aspart  0-9 Units Subcutaneous TID WC  . levETIRAcetam  500 mg Oral Daily  . LORazepam  1 mg Oral QHS  . lubiprostone  8 mcg Oral  BID WC  . multivitamin with minerals  1 tablet Oral Daily  . piperacillin-tazobactam (ZOSYN)  IV  3.375 g Intravenous Q8H  . polyethylene glycol  17 g Oral BID  .  sodium chloride  3 mL Intravenous Q12H  . topiramate  25 mg Oral BID  . vitamin C  500 mg Oral BID  . zinc sulfate  220 mg Oral Daily   Continuous Infusions: . sodium chloride Stopped (10/08/13 1349)  . sodium chloride 75 mL/hr at 10/09/13 0708     Starlee Corralejo, DO  [facility] Pager [phone]  If 7PM-7AM, please contact night-coverage [email or web address] Password [facility] 10/09/2013, 4:39 PM   LOS: 1 day

## 2013-10-09 NOTE — Evaluation (Signed)
Physical Therapy Evaluation Patient Details Name: [name] MRN: [id number] DOB: 14-Apr-1941 Today's Date: 10/09/2013 Time: 5427-0623 PT Time Calculation (min): 19 min  PT Assessment / Plan / Recommendation History of Present Illness  73 yo male admitted with acute on chronic renal failure, fall, weakness. Hx of CVA, COPD, HTN, DM, aphasia, dysphagia.   Clinical Impression  On eval, pt required Min assist for mobility-able to ambulate ~30 feet (15'x2) in room with walker. Recommend HHPT, 24/7 care at discharge. If family unable to provide this, may need to consider SNF. No family present during eval to discuss d/c plan.     PT Assessment  Patient needs continued PT services    Follow Up Recommendations  Home health PT;Supervision/Assistance - 24 hour    Does the patient have the potential to tolerate intense rehabilitation      Barriers to Discharge        Equipment Recommendations  None recommended by PT    Recommendations for Other Services OT consult   Frequency Min 3X/week    Precautions / Restrictions Precautions Precautions: Fall Restrictions Weight Bearing Restrictions: No   Pertinent Vitals/Pain R LE "a little"      Mobility  Bed Mobility Overal bed mobility: Needs Assistance Bed Mobility: Supine to Sit;Sit to Supine Supine to sit: Modified independent (Device/Increase time);HOB elevated Sit to supine: Modified independent (Device/Increase time);HOB elevated General bed mobility comments: Increased time and use of rail  Transfers Overall transfer level: Needs assistance Transfers: Sit to/from Stand Sit to Stand: Min assist General transfer comment: x 2 from bed, toilet. VCs safety, technique, hand placement. Assist to rise, stabillize, control descent Ambulation/Gait Ambulation/Gait assistance: Min assist Ambulation Distance (Feet): 15 Feet (x2) Assistive device: Rolling walker (2 wheeled) Gait Pattern/deviations: Step-through pattern;Decreased  stride length General Gait Details: Ambulated to and from bathroom in room. VCs safety. Min assist to steady intermittently.     Exercises     PT Diagnosis: Difficulty walking;Generalized weakness  PT Problem List: Decreased strength;Decreased activity tolerance;Decreased balance;Decreased mobility;Decreased safety awareness;Decreased knowledge of use of DME PT Treatment Interventions: DME instruction;Gait training;Functional mobility training;Therapeutic activities;Therapeutic exercise;Balance training;Patient/family education     PT Goals(Current goals can be found in the care plan section) Acute Rehab PT Goals Patient Stated Goal: none stated PT Goal Formulation: Patient unable to participate in goal setting Time For Goal Achievement: 10/23/13 Potential to Achieve Goals: Good  Visit Information  Last PT Received On: 10/09/13 Assistance Needed: +1 History of Present Illness: 73 yo male admitted with acute on chronic renal failure, fall, weakness. Hx of CVA, COPD, HTN, DM, aphasia, dysphagia.        Prior [place] expects to be discharged to:: Unsure Living Arrangements: Spouse/significant other Available Help at Discharge: Family;Available 24 hours/day Type of Home: House Home Access: Ramped entrance Home Layout: One level Home Equipment: Bedside commode;Wheelchair - manual Additional Comments: Daughter arrived at end of session and reported that pt does have a w/c but he will not use it.  States he has a rollator with a seat that he will often use.  Daughter reports that pt has h/o falls at home, states his "legs will just give out at times."   Prior Function Level of Independence: Needs assistance Gait / Transfers Assistance Needed: Pt ambulates wiht rollator.  Daughter states that someone is usually with him when he is walking and that she has had to catch him from faling multiple times.  ADL's / Homemaking Assistance Needed: Assist for  bathing/dressing. Communication Communication: Expressive difficulties Dominant Hand: Right    Cognition  Cognition Arousal/Alertness: Awake/alert Behavior During Therapy: WFL for tasks assessed/performed Overall Cognitive Status: Within Functional Limits for tasks assessed    Extremity/Trunk Assessment Upper Extremity Assessment Upper Extremity Assessment: Generalized weakness Lower Extremity Assessment Lower Extremity Assessment: Generalized weakness Cervical / Trunk Assessment Cervical / Trunk Assessment: Normal   Balance Balance Overall balance assessment: History of Falls;Needs assistance Sitting-balance support: Bilateral upper extremity supported;Feet supported Sitting balance-Leahy Scale: Good Standing balance support: Bilateral upper extremity supported;During functional activity Standing balance-Leahy Scale: Fair  End of Session PT - End of Session Equipment Utilized During Treatment: Gait belt Activity Tolerance: Patient tolerated treatment well Patient left: in bed;with call bell/phone within reach;with bed alarm set (no recliner available in room) Nurse Communication: Mobility status  GP     Weston Anna, MPT Pager: [phone]

## 2013-10-10 ENCOUNTER — Other Ambulatory Visit: Payer: Self-pay | Admitting: Urology

## 2013-10-10 DIAGNOSIS — N2889 Other specified disorders of kidney and ureter: Secondary | ICD-10-CM

## 2013-10-10 DIAGNOSIS — I2510 Atherosclerotic heart disease of native coronary artery without angina pectoris: Secondary | ICD-10-CM

## 2013-10-10 DIAGNOSIS — S37019A Minor contusion of unspecified kidney, initial encounter: Secondary | ICD-10-CM

## 2013-10-10 LAB — CBC
HCT: 27.5 % — ABNORMAL LOW (ref 39.0–52.0)
HEMOGLOBIN: 9.5 g/dL — AB (ref 13.0–17.0)
MCH: 28.4 pg (ref 26.0–34.0)
MCHC: 34.5 g/dL (ref 30.0–36.0)
MCV: 82.3 fL (ref 78.0–100.0)
Platelets: 133 10*3/uL — ABNORMAL LOW (ref 150–400)
RBC: 3.34 MIL/uL — ABNORMAL LOW (ref 4.22–5.81)
RDW: 12.7 % (ref 11.5–15.5)
WBC: 8.7 10*3/uL (ref 4.0–10.5)

## 2013-10-10 LAB — BASIC METABOLIC PANEL
BUN: 35 mg/dL — ABNORMAL HIGH (ref 6–23)
CALCIUM: 7.8 mg/dL — AB (ref 8.4–10.5)
CO2: 21 mEq/L (ref 19–32)
Chloride: 103 mEq/L (ref 96–112)
Creatinine, Ser: 2.8 mg/dL — ABNORMAL HIGH (ref 0.50–1.35)
GFR calc Af Amer: 25 mL/min — ABNORMAL LOW (ref >90)
GFR, EST NON AFRICAN AMERICAN: 21 mL/min — AB (ref >90)
Glucose, Bld: 163 mg/dL — ABNORMAL HIGH (ref 70–99)
Potassium: 3.8 mEq/L (ref 3.7–5.3)
Sodium: 137 mEq/L (ref 137–147)

## 2013-10-10 LAB — GLUCOSE, CAPILLARY
Glucose-Capillary: 164 mg/dL — ABNORMAL HIGH (ref 70–99)
Glucose-Capillary: 128 mg/dL — ABNORMAL HIGH (ref 70–99)
Glucose-Capillary: 170 mg/dL — ABNORMAL HIGH (ref 70–99)
Glucose-Capillary: 123 mg/dL — ABNORMAL HIGH (ref 70–99)

## 2013-10-10 LAB — CLOSTRIDIUM DIFFICILE BY PCR: Toxigenic C. Difficile by PCR: NEGATIVE

## 2013-10-10 NOTE — Progress Notes (Signed)
MD called and ordered SCD's and to D/C aspirin, lovenox, plavix. Continue to monitor.

## 2013-10-10 NOTE — Progress Notes (Signed)
[facility]  Patient Status: Active (receiving services up to time of hospitalization)  [facility] is providing the following services: RN, PT and OT  If patient discharges after hours, please call [phone].   [name] 10/10/2013, 4:05 PM

## 2013-10-10 NOTE — Progress Notes (Signed)
Patient droplet precautions discontinued.  Patient has not had a fever and has had no aspirin, tylenol, or ibuprofen in the last 24 hours.  Discussed with Margarita Grizzle, Infection Prevention RN.

## 2013-10-10 NOTE — Progress Notes (Signed)
[facility] PROGRESS NOTE  KLINE BOWDER [id number] DOB: 03-08-1941 DOA: 10/08/2013 PCP: Stephens Shire, MD  Assessment/Plan: Acute on chronic renal failure (CKD stage III)  -multifactorial from renal parenchymal compression from hematoma and volume depletion - history of decreased po intake  -Renal ultrasound--negative for hydronephrosis, right renal hyperechogenicity region-->CT abdomen and pelvis   -Baseline creatinine 1.0-1.3  Right perinephric hematoma -10/09/2013 CT abdomen and pelvis showed right renal subcapsular hematoma with right renal parenchymal compression -Appreciate Dr. Junious Silk consult -d/c ASA/Plavix, enoxaparin -may have been due to mechanical fall -monitor H/H -will de-escalate zosyn if blood culture remains neg in next 24hrs. -Left subclavian and left circumflex stent placed 05/14/2012 Generalized weakness  -Multifactorial including possible UTI as well as acute on chronic renal failure and anemia -PT evaluation-->HHPT  Fever  -in part due to R-perinephric hematoma -Blood cultures x2 sets--neg to date -Urine culture--no growth  -Empiric Zosyn given the patient's recent hospitalization and lithotripsy  -Influenza pcr--negative  Hypertension  -Discontinue Diovan as blood pressure is soft and with renal failure  -In light of the patient's renal artery stenosis, will not likely restart Diovan  -BP remains stable Diabetes mellitus type 2  -NovoLog sliding scale--change to moderate scale  -Hemoglobin A1c--7.9  -Discontinue metformin  CAD/history of stroke  -Continue Plavix and aspirin  Seizure disorder  -Continue Topamax and Keppra  -Keppra has been decreased to once daily due to the patient's renal failure  Abdominal pain/Atypical chest pain  -unclear if pt truly has cp  -cycle troponins--neg x 3  -2-view abd xray--no bowel obstruction  RUE pain and edema  -venous duplex--negative  Family Communication: Wife at beside  Disposition Plan:  Home when medically stable  Antibiotics:  Zosyn 10/09/13>>>         Procedures/Studies: Ct Abdomen Pelvis Wo Contrast  10/09/2013   CLINICAL DATA:  Abdominal pain and fever. Right perirenal hematoma. Recent lithotripsy.  EXAM: CT ABDOMEN AND PELVIS WITHOUT CONTRAST  TECHNIQUE: Multidetector CT imaging of the abdomen and pelvis was performed following the standard protocol without intravenous contrast.  COMPARISON:  CT scan dated 10/03/2013  FINDINGS: There are new tiny bilateral effusions with new slight right base atelectasis.  There is a prominent right renal subcapsular hematoma which completely surrounds the right kidney and compresses it.  There is a 23 x 11 x 6 mm stone fragment in the distal right renal pelvis. There are multiple small stone fragments in the right renal collecting system proximal to the stone. There is also blood in the exophytic cyst in the right upper pole of the kidney.  There is a small amount of blood extending along Gerota's fascia and along the right psoas muscle into the right side of the pelvis.  The liver and spleen and pancreas are normal. There are multiple small stones in the gallbladder. No biliary ductal dilatation. There is a 2.2 cm nodule in the in the right adrenal gland consistent with a benign adenoma.  No acute abnormality of the bowel. There are surgical staples at in the ascending colon.  There is slight irregular thickening of the bladder wall as previously noted. No acute osseous abnormality.  IMPRESSION: 1. Prominent right subcapsular hematoma of the right kidney compressing the renal parenchyma. 2. Large stone fragment in the inferior aspect of the right renal pelvis without obstruction. Multiple smaller stone fragments in the proximal right renal collecting system. Critical Value/emergent results were called by telephone at the time of interpretation on 10/09/2013 at 10:34 PM to Junior, RN, who  verbally acknowledged these results.   Electronically Signed    By: Rozetta Nunnery M.D.   On: 10/09/2013 22:35   Dg Chest 2 View  10/08/2013   CLINICAL DATA:  Fall, weakness  EXAM: CHEST  2 VIEW  COMPARISON:  09/07/2013  FINDINGS: Cardiomediastinal silhouette is stable. No acute infiltrate or pleural effusion. No pulmonary edema. Status post CABG. Bony thorax is stable.  IMPRESSION: No active disease.  Status post CABG again noted.   Electronically Signed   By: Lahoma Crocker M.D.   On: 10/08/2013 13:14   Dg Abd 1 View  10/06/2013   CLINICAL DATA:  Preoperative lithotripsy ; right-sided calculus  EXAM: ABDOMEN - 1 VIEW  COMPARISON:  CT abdomen and pelvis October 03, 2013  FINDINGS: There is a calcification in the region of the right renal pelvis measuring 1.4 x 1.1 cm. There are vascular calcifications pelvis. There is postoperative change in the right abdomen. Bowel gas pattern is within normal limits.  IMPRESSION: 1.4 x 1.1 cm calculus in the region of the right renal pelvis. Bowel gas pattern unremarkable.   Electronically Signed   By: Lowella Grip M.D.   On: 10/06/2013 10:07   Ct Head Wo Contrast  10/08/2013   CLINICAL DATA:  Weakness  EXAM: CT HEAD WITHOUT CONTRAST  TECHNIQUE: Contiguous axial images were obtained from the base of the skull through the vertex without intravenous contrast.  COMPARISON:  MRI 10/25/2012  FINDINGS: Chronic left MCA infarct involving the left insula and basal ganglia, unchanged from the prior study.  Negative for acute infarct. Negative for hemorrhage or mass. Mild atrophy and chronic microvascular ischemic changes in the white matter.  IMPRESSION: Chronic left MCA infarct.  No acute abnormality.   Electronically Signed   By: Franchot Gallo M.D.   On: 10/08/2013 12:22   Ct Abdomen Pelvis W Contrast  10/03/2013   CLINICAL DATA:  Abdominal pain, no bowel movement for 4 days  EXAM: CT ABDOMEN AND PELVIS WITH CONTRAST  TECHNIQUE: Multidetector CT imaging of the abdomen and pelvis was performed using the standard protocol following bolus  administration of intravenous contrast.  CONTRAST:  165mL OMNIPAQUE IOHEXOL 300 MG/ML  SOLN  COMPARISON:  Abdominal radiograph 09/25/2013; prior CT abdomen/ pelvis 09/07/2013  FINDINGS: Lower Chest: Dependent atelectasis in the lower lobes. Visualized heart is within normal limits for size. No pericardial effusion. Atherosclerotic calcifications noted in the coronary arteries. Mild calcification of the aortic valve annulus. Unremarkable distal thoracic esophagus.  Abdomen: Unremarkable CT appearance of the stomach, duodenum, spleen, pancreas and left adrenal gland. Stable 1.5 cm low-attenuation nodule arising from the lateral limb of the right adrenal gland dating back to June of 2013. Stability is suggestive of a benign process such as adenoma. Normal hepatic contour and morphology. There is a single 8 mm enhancing nodule in the dome of the right liver (image 9 series 2) which is not identified on prior imaging although this may be secondary to differences in contrast timing.  Cholelithiasis without secondary signs to suggest acute cholecystitis. No intra or extrahepatic biliary ductal dilatation.  Stable 11 mm stone in the right renal pelvis. This is nonobstructing. A smaller 3 mm stone is noted in the right lower pole. No calculi in the left kidney. Numerous bilateral renal cortical cysts are unchanged in size, number and configuration.  There are a few scattered colonic diverticula. No evidence of active inflammation to suggest acute diverticulitis. Surgical changes of prior appendectomy and partial see connecting the. The ileal  colonic anastomosis is widely patent. The descending and sigmoid colon are decompressed. There is only a moderate stool burden in the ascending and transverse colon. No free fluid or suspicious adenopathy.  Pelvis: Prostatomegaly. Prostate gland measures 5.3 x 5.0 cm and indents the bladder base. The bladder is distended with urine, however there is diffuse thickening of the bladder wall  as well as a suggestion of trabeculation and increased enhancement. Findings are similar to the prior CT scan from 09/07/2013.  Bones/Soft Tissues: No acute fracture or aggressive appearing lytic or blastic osseous lesion. Multilevel degenerative disc disease and right worse than left lower lumbar facet arthropathy.  Vascular: Extensive atherosclerotic vascular disease. No aneurysmal dilatation. There is a moderate to high-grade stenosis of at the origin of the a right main renal artery. Likely mild-to-moderate stenosis at the origin of the left renal artery. Irregular atherosclerotic plaque noted in the infrarenal abdominal aorta. There is evidence of mild plaque ulceration as well. Irregular noncalcified fibro fatty plaque noted in the SMA approximately 3 cm from the origin results in at least moderate narrowing.  IMPRESSION: 1. No evidence of obstruction, or significant constipation/impaction. The overall stool burden is relatively low and predominantly in the ascending and transverse colon. The descending and sigmoid colons are decompressed. 2. Extensive atherosclerotic vascular disease including probable bilateral (right greater than left) renal artery stenoses, SMA stenosis, ulcerated plaque in the infrarenal abdominal aorta and coronary artery disease. 3. Irregular bladder wall thickening and enhancement similar to slightly improved compared to 09/07/2013. This may reflect recurrent or chronic cystitis. Additionally, an element of bladder outlet obstruction is suggested by prostatomegaly and likely trabeculation of the bladder wall. 4. An 8 mm arterially enhancing lesion is identified in the hepatic dome. This was not clearly seen on prior studies. This could simply represent a flash fill capillary hemangioma, or transient hepatic attenuation difference which was not seen on prior imaging dated differences in contrast timing. However, given the patient's history of colon cancer and early metastatic lesion is  not excluded. Recommend further evaluation with MRI of the abdomen with and without contrast in 3 months. 5. Nonobstructing right renal calculi. 6. Additional ancillary findings as above.   Electronically Signed   By: Jacqulynn Cadet M.D.   On: 10/03/2013 08:12   US Renal  10/08/2013   CLINICAL DATA:  Right-sided lithotripsy 10/06/13. Acute on chronic renal failure.  EXAM: RENAL/URINARY TRACT ULTRASOUND COMPLETE  COMPARISON:  DG ABD 2 VIEWS dated 10/08/2013; CT ABD/PELVIS W CM dated 10/03/2013; US ABDOMEN COMPLETE dated 09/08/2013  FINDINGS: Right Kidney: 12.4 cm. No hydronephrosis. Normal renal cortical thickness and echogenicity. An area of hyper echogenicity which measures 8.8 cm along the lateral aspect of the interpolar right kidney is without correlate on the recent CT.  A minimally complex cyst measures 2.9 cm in the upper pole right kidney.  Left Kidney: 12.7 cm. No hydronephrosis. Normal renal cortical thickness and echogenicity. Upper pole complex cyst measures 2.8 cm. An interpolar lateral cyst or complex cyst measures 1.4 cm.  Bladder: Wall thickening. This is likely secondary to prostatomegaly. Debris within the dependent bladder.  IMPRESSION: 1. No hydronephrosis or other explanation for renal insufficiency. 2. Peripheral right interpolar renal area of hyperechogenicity. No correlate on the recent CT. This could be artifact or represent an area of infection or perirenal hematoma. Depending on clinical symptomatology, followup with CT should be considered. 3. Bilateral renal lesions, most consistent with cysts of varying complexity. 4. Prostatomegaly with likely secondary bladder outlet obstruction.  Debris within the dependent bladder for which superimposed infection cannot be excluded.   Electronically Signed   By: Abigail Miyamoto M.D.   On: 10/08/2013 19:46   Dg Abd 2 Views  10/08/2013   CLINICAL DATA:  Abdominal pain and fever.  EXAM: ABDOMEN - 2 VIEW  COMPARISON:  10/06/2013  FINDINGS: There is  no free air or free fluid in the abdomen. The bowel gas pattern is normal. The right renal stone seen on the prior study and is now fragmented. There appear to be multiple stone fragments in the renal pelvis as well as a small fragment in the lower pole the right kidney. No discrete fragments along the course of the right ureter.  Vascular calcifications are present in the pelvis.  IMPRESSION: Multiple fragments of the right renal stone are seen in the region of the right renal pelvis as described. Otherwise benign appearing abdomen.   Electronically Signed   By: Rozetta Nunnery M.D.   On: 10/08/2013 18:12         Subjective: Patient denies fevers, chills, chest discomfort, shortness breath, nausea, vomiting, diarrhea, dysuria. He has some right-sided abdominal pain.  Objective: Filed Vitals:   10/09/13 1522 10/09/13 1800 10/09/13 2204 10/10/13 0431  BP: 138/62  145/63 137/54  Pulse: 77  65 68  Temp: 100.5 F (38.1 C) 98.4 F (36.9 C) 99 F (37.2 C) 98.8 F (37.1 C)  TempSrc: Oral Oral Oral Oral  Resp: 20  17 18   Height:      Weight:      SpO2: 99%  97% 98%    Intake/Output Summary (Last 24 hours) at 10/10/13 0800 Last data filed at 10/10/13 0536  Gross per 24 hour  Intake 2467.5 ml  Output    100 ml  Net 2367.5 ml   Weight change:  Exam:   General:  Pt is alert, follows commands appropriately, not in acute distress  HEENT: No icterus, No thrush, No neck mass, [place]/AT  Cardiovascular: RRR, S1/S2, no rubs, no gallops  Respiratory: Bibasilar crackles. No wheezing. Good air movement.  Abdomen: Soft/+BS, right flank tenderness., non distended, no guarding  Extremities: trace LE edema, No lymphangitis, No petechiae, No rashes, no synovitis  Data Reviewed: Basic Metabolic Panel:  Recent Labs Lab 10/08/13 1230 10/09/13 0533 10/10/13 0447  NA 138 138 137  K 4.2 3.8 3.8  CL 100 103 103  CO2 25 24 21   GLUCOSE 182* 167* 163*  BUN 39* 38* 35*  CREATININE 2.95* 2.89*  2.80*  CALCIUM 8.9 7.7* 7.8*   Liver Function Tests:  Recent Labs Lab 10/08/13 1230 10/09/13 0533  AST 17 12  ALT 8 7  ALKPHOS 74 60  BILITOT 0.4 0.6  PROT 6.5 5.2*  ALBUMIN 2.7* 2.1*    Recent Labs Lab 10/08/13 1230  LIPASE 14   No results found for this basename: AMMONIA,  in the last 168 hours CBC:  Recent Labs Lab 10/08/13 1230 10/10/13 0447  WBC 11.7* 8.7  NEUTROABS 9.3*  --   HGB 11.5* 9.5*  HCT 33.4* 27.5*  MCV 82.5 82.3  PLT 137* 133*   Cardiac Enzymes:  Recent Labs Lab 10/08/13 1830 10/08/13 2330 10/09/13 0533  TROPONINI <0.30 <0.30 <0.30   BNP: No components found with this basename: POCBNP,  CBG:  Recent Labs Lab 10/09/13 0758 10/09/13 1152 10/09/13 1650 10/09/13 2202 10/10/13 0723  GLUCAP 169* 199* 174* 121* 164*    Recent Results (from the past 240 hour(s))  URINE CULTURE  Status: None   Collection Time    10/03/13  5:46 AM      Result Value Range Status   Specimen Description URINE, CLEAN CATCH   Final   Special Requests NONE   Final   Culture  Setup Time     Final   Value: 10/03/2013 08:58     Performed at [place]     Final   Value: 75,000 COLONIES/ML     Performed at [facility]   Culture     Final   Value: Multiple bacterial morphotypes present, none predominant. Suggest appropriate recollection if clinically indicated.     Performed at [facility]   Report Status 10/04/2013 FINAL   Final  URINE CULTURE     Status: None   Collection Time    10/08/13 12:48 PM      Result Value Range Status   Specimen Description URINE, CATHETERIZED   Final   Special Requests NONE   Final   Culture  Setup Time     Final   Value: 10/08/2013 17:45     Performed at [place]     Final   Value: NO GROWTH     Performed at [facility]   Culture     Final   Value: NO GROWTH     Performed at [facility]   Report Status 10/09/2013 FINAL   Final      Scheduled Meds: . escitalopram  10 mg Oral Daily  . finasteride  5 mg Oral Daily  . insulin aspart  0-15 Units Subcutaneous TID WC  . levETIRAcetam  500 mg Oral Daily  . LORazepam  1 mg Oral QHS  . lubiprostone  8 mcg Oral BID WC  . multivitamin with minerals  1 tablet Oral Daily  . piperacillin-tazobactam (ZOSYN)  IV  3.375 g Intravenous Q8H  . polyethylene glycol  17 g Oral BID  . sodium chloride  3 mL Intravenous Q12H  . topiramate  25 mg Oral BID  . vitamin C  500 mg Oral BID  . zinc sulfate  220 mg Oral Daily   Continuous Infusions: . sodium chloride 75 mL/hr at 10/10/13 0648     Eliannah Hinde, DO  [facility] Pager [phone]  If 7PM-7AM, please contact night-coverage [email or web address] Password [email or web address] 10/10/2013, 8:00 AM   LOS: 2 days

## 2013-10-10 NOTE — Consult Note (Signed)
Consult: per-nephric hematoma, renal stones Requested by Dr. Carles Collet  History of Present Illness:  Pt s/p right ESWL (10/06/2013) admit for genralzied weakness and a fall. He's has some LG temps and Cr increased to 2.8. Urine Cx negative. Renal US no hydro, perinephric hyperechogenicity. CT with moderated right perinephric hematoma and large fragment at UPJ. Again no hydro.   He has a h/o of hypertension, diabetes mellitus, stroke, seizure disorder, COPD.  He is without complaint today and looks well.    Past Medical History  Diagnosis Date  . Myocardial infarction   . CVA (cerebral infarction)   . Diabetes mellitus   . Hypertension   . Diabetes mellitus   . Aphasia   . Dysphagia   . COPD (chronic obstructive pulmonary disease)   . Shortness of breath   . Anemia   . Stroke   . Colon cancer   . Hemorrhoid   . Intractable seizures   . Pseudobulbar affect   . Dyslipidemia   . CAD (coronary artery disease)   . History of hemorrhoids    Past Surgical History  Procedure Laterality Date  . Coronary artery bypass graft    . Appendectomy    . Dental surgery    . Colonoscopy  03/13/2012    Procedure: COLONOSCOPY;  Surgeon: Lafayette Dragon, MD;  Location: [facility];  Service: Endoscopy;  Laterality: N/A;  . Partial colectomy  03/22/2012    Procedure: PARTIAL COLECTOMY;  Surgeon: Gwenyth Ober, MD;  Location: [facility];  Service: General;  Laterality: Right;  . Coronary artery bypass graft      x3  . Coronary stent placement      Home Medications:  Prescriptions prior to admission  Medication Sig Dispense Refill  . aspirin EC 81 MG tablet Take 1 tablet (81 mg total) by mouth daily. For CVA prophylaxis.      Marland Kitchen clopidogrel (PLAVIX) 75 MG tablet Take 1 tablet (75 mg total) by mouth daily.      Marland Kitchen escitalopram (LEXAPRO) 10 MG tablet Take 10 mg by mouth daily.      . finasteride (PROSCAR) 5 MG tablet Take 5 mg by mouth daily.      Marland Kitchen levETIRAcetam (KEPPRA) 500 MG tablet Take 500 mg by mouth  every 12 (twelve) hours.      Marland Kitchen lubiprostone (AMITIZA) 8 MCG capsule Take 1 capsule (8 mcg total) by mouth 2 (two) times daily with a meal.  60 capsule  6  . Multiple Vitamin (MULITIVITAMIN WITH MINERALS) TABS Take 1 tablet by mouth daily.      . nitrofurantoin, macrocrystal-monohydrate, (MACROBID) 100 MG capsule Take 100 mg by mouth 2 (two) times daily.      . nitroGLYCERIN (NITROSTAT) 0.4 MG SL tablet Place 0.4 mg under the tongue every 5 (five) minutes x 3 doses as needed. For chest pain      . oxazepam (SERAX) 30 MG capsule Take 30 mg by mouth at bedtime.      Marland Kitchen oxyCODONE-acetaminophen (ROXICET) 5-325 MG per tablet Take 1 tablet by mouth every 4 (four) hours as needed for severe pain.  30 tablet  0  . polyethylene glycol (MIRALAX) packet Take 17 g by mouth 2 (two) times daily.  30 each  0  . promethazine (PHENERGAN) 25 MG tablet Take 25 mg by mouth every 6 (six) hours as needed. For nausea.      . sitaGLIPtan-metformin (JANUMET) 50-1000 MG per tablet Take 1 tablet by mouth 2 (two) times daily with  a meal.      . topiramate (TOPAMAX) 25 MG tablet Take 1 tablet (25 mg total) by mouth 2 (two) times daily.  60 tablet  3  . valsartan (DIOVAN) 160 MG tablet Take 160 mg by mouth daily.      . vitamin C (ASCORBIC ACID) 500 MG tablet Take 500 mg by mouth 2 (two) times daily.      Marland Kitchen zinc sulfate 220 MG capsule Take 220 mg by mouth daily.      Marland Kitchen albuterol (PROVENTIL HFA;VENTOLIN HFA) 108 (90 BASE) MCG/ACT inhaler Inhale 1-2 puffs into the lungs every 6 (six) hours as needed for wheezing.  1 Inhaler  0   Allergies: No Known Allergies  Family History  Problem Relation Age of Onset  . Breast cancer Mother   . Heart disease Brother   . Heart attack Brother   . Colon cancer Maternal Uncle   . Heart attack Father   . Heart attack Sister    Social History:  reports that he has been smoking Cigarettes.  He has a 16 pack-year smoking history. He has never used smokeless tobacco. He reports that he does  not drink alcohol or use illicit drugs.  ROS: A complete review of systems was performed.  All systems are negative except for pertinent findings as noted. Review of Systems  All other systems reviewed and are negative.     Physical Exam:  Vital signs in last 24 hours: Temp:  [98.4 F (36.9 C)-100.5 F (38.1 C)] 98.8 F (37.1 C) (01/23 0431) Pulse Rate:  [65-77] 68 (01/23 0431) Resp:  [17-20] 18 (01/23 0431) BP: (137-145)/(54-63) 137/54 mmHg (01/23 0431) SpO2:  [97 %-99 %] 98 % (01/23 0431) General:  Alert and oriented, No acute distress HEENT: Normocephalic, atraumatic Neck: No JVD or lymphadenopathy Cardiovascular: Regular rate and rhythm Lungs: Regular rate and effort Abdomen: Soft, nontender, nondistended, no abdominal masses Back: No CVA tenderness Extremities: No edema Neurologic: Grossly intact GU: condom catheter - urine clear  Laboratory Data:  Results for orders placed during the hospital encounter of 10/08/13 (from the past 24 hour(s))  GLUCOSE, CAPILLARY     Status: Abnormal   Collection Time    10/09/13  7:58 AM      Result Value Range   Glucose-Capillary 169 (*) 70 - 99 mg/dL  GLUCOSE, CAPILLARY     Status: Abnormal   Collection Time    10/09/13 11:52 AM      Result Value Range   Glucose-Capillary 199 (*) 70 - 99 mg/dL  GLUCOSE, CAPILLARY     Status: Abnormal   Collection Time    10/09/13  4:50 PM      Result Value Range   Glucose-Capillary 174 (*) 70 - 99 mg/dL  GLUCOSE, CAPILLARY     Status: Abnormal   Collection Time    10/09/13 10:02 PM      Result Value Range   Glucose-Capillary 121 (*) 70 - 99 mg/dL  CBC     Status: Abnormal   Collection Time    10/10/13  4:47 AM      Result Value Range   WBC 8.7  4.0 - 10.5 K/uL   RBC 3.34 (*) 4.22 - 5.81 MIL/uL   Hemoglobin 9.5 (*) 13.0 - 17.0 g/dL   HCT 27.5 (*) 39.0 - 52.0 %   MCV 82.3  78.0 - 100.0 fL   MCH 28.4  26.0 - 34.0 pg   MCHC 34.5  30.0 - 36.0 g/dL  RDW 12.7  11.5 - 15.5 %   Platelets  133 (*) 150 - 400 K/uL  BASIC METABOLIC PANEL     Status: Abnormal   Collection Time    10/10/13  4:47 AM      Result Value Range   Sodium 137  137 - 147 mEq/L   Potassium 3.8  3.7 - 5.3 mEq/L   Chloride 103  96 - 112 mEq/L   CO2 21  19 - 32 mEq/L   Glucose, Bld 163 (*) 70 - 99 mg/dL   BUN 35 (*) 6 - 23 mg/dL   Creatinine, Ser 2.80 (*) 0.50 - 1.35 mg/dL   Calcium 7.8 (*) 8.4 - 10.5 mg/dL   GFR calc non Af Amer 21 (*) >90 mL/min   GFR calc Af Amer 25 (*) >90 mL/min   Recent Results (from the past 240 hour(s))  URINE CULTURE     Status: None   Collection Time    10/03/13  5:46 AM      Result Value Range Status   Specimen Description URINE, CLEAN CATCH   Final   Special Requests NONE   Final   Culture  Setup Time     Final   Value: 10/03/2013 08:58     Performed at [place]     Final   Value: 75,000 COLONIES/ML     Performed at [facility]   Culture     Final   Value: Multiple bacterial morphotypes present, none predominant. Suggest appropriate recollection if clinically indicated.     Performed at [facility]   Report Status 10/04/2013 FINAL   Final  URINE CULTURE     Status: None   Collection Time    10/08/13 12:48 PM      Result Value Range Status   Specimen Description URINE, CATHETERIZED   Final   Special Requests NONE   Final   Culture  Setup Time     Final   Value: 10/08/2013 17:45     Performed at [place]     Final   Value: NO GROWTH     Performed at [facility]   Culture     Final   Value: NO GROWTH     Performed at [facility]   Report Status 10/09/2013 FINAL   Final   Creatinine:  Recent Labs  10/08/13 1230 10/09/13 0533 10/10/13 0447  CREATININE 2.95* 2.89* 2.80*    Impression/Assessment/Plan: -perinephric hematoma - serial h/h. Hold anticoagulation for 2 weeks if possible, if he needs it could restart in a few days once h/h stable. H/h slightly lower  compared to 2 days ago. Agree with empiric coverage for UTI which can be converted to po. Urine clear. He can ambulate.  -right renal pelvic stone - no hydro/obstruction, but pt did have intermittent right flank pain prior to ESWL. The stone is in a position to obstruct and although his ARF is from compression, a stent might help drain the kidney. Also he is going to need URS to clear the stone and a stent would prepare the way. I discussed with patient and daughter nature, r/b/a of right ureteral stent and they elect to proceed. I'll set it up for the morning.   -Acute on CRF - CR slowly improving , likely in part due to renal parenchymal compression from hematoma.    Fredricka Bonine 10/10/2013

## 2013-10-10 NOTE — Progress Notes (Signed)
Patient vomiting and having loose stools.  Administered zofran per PRN order and notified Dr. Carles Collet.  Orders for C. Diff PCR and isolation precautions placed.  Will continue to monitor.

## 2013-10-11 ENCOUNTER — Encounter (HOSPITAL_COMMUNITY)
Admission: EM | Disposition: A | Discharge status: Hospice | Payer: Self-pay | Source: Home / Self Care | Attending: Internal Medicine

## 2013-10-11 ENCOUNTER — Encounter (HOSPITAL_COMMUNITY): Payer: Medicare Other | Admitting: Anesthesiology

## 2013-10-11 ENCOUNTER — Inpatient Hospital Stay (HOSPITAL_COMMUNITY): Payer: Medicare Other | Admitting: Anesthesiology

## 2013-10-11 ENCOUNTER — Inpatient Hospital Stay (HOSPITAL_COMMUNITY): Payer: Medicare Other

## 2013-10-11 DIAGNOSIS — N200 Calculus of kidney: Secondary | ICD-10-CM

## 2013-10-11 HISTORY — PX: CYSTOSCOPY WITH STENT PLACEMENT: SHX5790

## 2013-10-11 LAB — BASIC METABOLIC PANEL
BUN: 34 mg/dL — AB (ref 6–23)
CHLORIDE: 104 meq/L (ref 96–112)
CO2: 19 mEq/L (ref 19–32)
Calcium: 7.8 mg/dL — ABNORMAL LOW (ref 8.4–10.5)
Creatinine, Ser: 2.64 mg/dL — ABNORMAL HIGH (ref 0.50–1.35)
GFR calc Af Amer: 26 mL/min — ABNORMAL LOW (ref >90)
GFR calc non Af Amer: 23 mL/min — ABNORMAL LOW (ref >90)
GLUCOSE: 143 mg/dL — AB (ref 70–99)
Potassium: 3.9 mEq/L (ref 3.7–5.3)
Sodium: 139 mEq/L (ref 137–147)

## 2013-10-11 LAB — SURGICAL PCR SCREEN
MRSA, PCR: NEGATIVE
STAPHYLOCOCCUS AUREUS: NEGATIVE

## 2013-10-11 LAB — CBC
HEMATOCRIT: 30.9 % — AB (ref 39.0–52.0)
HEMOGLOBIN: 10.5 g/dL — AB (ref 13.0–17.0)
MCH: 28 pg (ref 26.0–34.0)
MCHC: 34 g/dL (ref 30.0–36.0)
MCV: 82.4 fL (ref 78.0–100.0)
Platelets: 181 10*3/uL (ref 150–400)
RBC: 3.75 MIL/uL — AB (ref 4.22–5.81)
RDW: 12.9 % (ref 11.5–15.5)
WBC: 10.1 10*3/uL (ref 4.0–10.5)

## 2013-10-11 LAB — GLUCOSE, CAPILLARY
GLUCOSE-CAPILLARY: 152 mg/dL — AB (ref 70–99)
Glucose-Capillary: 138 mg/dL — ABNORMAL HIGH (ref 70–99)
Glucose-Capillary: 158 mg/dL — ABNORMAL HIGH (ref 70–99)
Glucose-Capillary: 142 mg/dL — ABNORMAL HIGH (ref 70–99)

## 2013-10-11 SURGERY — CYSTOSCOPY, WITH STENT INSERTION
Anesthesia: Monitor Anesthesia Care | Site: Ureter | Laterality: Right

## 2013-10-11 MED ORDER — PROPOFOL 10 MG/ML IV BOLUS
INTRAVENOUS | Status: AC
  Filled 2013-10-11: qty 20

## 2013-10-11 MED ORDER — 0.9 % SODIUM CHLORIDE (POUR BTL) OPTIME
TOPICAL | Status: DC | PRN
  Administered 2013-10-11 (×2): 1000 mL

## 2013-10-11 MED ORDER — LIDOCAINE HCL (CARDIAC) 20 MG/ML IV SOLN
INTRAVENOUS | Status: AC
  Filled 2013-10-11: qty 5

## 2013-10-11 MED ORDER — INDIGOTINDISULFONATE SODIUM 8 MG/ML IJ SOLN
5.0000 mL | Freq: Once | INTRAMUSCULAR | Status: DC
  Filled 2013-10-11: qty 5

## 2013-10-11 MED ORDER — INDIGOTINDISULFONATE SODIUM 8 MG/ML IJ SOLN
INTRAMUSCULAR | Status: DC | PRN
  Administered 2013-10-11: 5 mL via INTRAVENOUS

## 2013-10-11 MED ORDER — LACTATED RINGERS IV SOLN
INTRAVENOUS | Status: DC | PRN
  Administered 2013-10-11 (×2): via INTRAVENOUS

## 2013-10-11 MED ORDER — MIDAZOLAM HCL 2 MG/2ML IJ SOLN
INTRAMUSCULAR | Status: AC
  Filled 2013-10-11: qty 2

## 2013-10-11 MED ORDER — CIPROFLOXACIN IN D5W 400 MG/200ML IV SOLN: 400.0000 mg | INTRAVENOUS | Status: DC

## 2013-10-11 MED ORDER — PROMETHAZINE HCL 25 MG/ML IJ SOLN
6.2500 mg | INTRAMUSCULAR | Status: DC | PRN
  Administered 2013-10-11: 6.25 mg via INTRAVENOUS
  Filled 2013-10-11: qty 1

## 2013-10-11 MED ORDER — PROPOFOL INFUSION 10 MG/ML OPTIME
INTRAVENOUS | Status: DC | PRN
  Administered 2013-10-11: 80 ug/kg/min via INTRAVENOUS

## 2013-10-11 MED ORDER — SODIUM CHLORIDE 0.9 % IR SOLN
Status: DC | PRN
  Administered 2013-10-11: 3000 mL via INTRAVESICAL
  Administered 2013-10-11 (×4): 1000 mL via INTRAVESICAL

## 2013-10-11 MED ORDER — FENTANYL CITRATE 0.05 MG/ML IJ SOLN
INTRAMUSCULAR | Status: AC
  Filled 2013-10-11: qty 2

## 2013-10-11 MED ORDER — FENTANYL CITRATE 0.05 MG/ML IJ SOLN: 25.0000 ug | INTRAMUSCULAR | Status: DC | PRN

## 2013-10-11 SURGICAL SUPPLY — 14 items
BAG URO CATCHER STRL LF (DRAPE) ×3 IMPLANT
CATH INTERMIT  6FR 70CM (CATHETERS) ×2 IMPLANT
CATH URET 5FR 28IN CONE TIP (BALLOONS) ×2
CATH URET 5FR 70CM CONE TIP (BALLOONS) IMPLANT
CLOTH BEACON ORANGE TIMEOUT ST (SAFETY) ×3 IMPLANT
DRAPE CAMERA CLOSED 9X96 (DRAPES) ×3 IMPLANT
GLOVE BIOGEL M 7.0 STRL (GLOVE) ×3 IMPLANT
GUIDEWIRE ANG ZIPWIRE 038X150 (WIRE) ×2 IMPLANT
GUIDEWIRE STR DUAL SENSOR (WIRE) ×3 IMPLANT
MANIFOLD NEPTUNE II (INSTRUMENTS) ×3 IMPLANT
NS IRRIG 1000ML POUR BTL (IV SOLUTION) ×3 IMPLANT
PACK CYSTO (CUSTOM PROCEDURE TRAY) ×3 IMPLANT
TUBING CONNECTING 10 (TUBING) ×2 IMPLANT
TUBING CONNECTING 10' (TUBING) ×1

## 2013-10-11 NOTE — Anesthesia Postprocedure Evaluation (Signed)
  Anesthesia Post Note  Patient: [name]  Procedure(s) Performed: Procedure(s) (LRB): CYSTOSCOPY (Right)  Anesthesia type: MAC  Patient location: PACU  Post pain: Pain level controlled  Post assessment: Post-op Vital signs reviewed  Last Vitals:  Filed Vitals:   10/11/13 1235  BP:   Pulse: 59  Temp:   Resp: 19    Post vital signs: Reviewed  Level of consciousness: sedated  Complications: No apparent anesthesia complications

## 2013-10-11 NOTE — Progress Notes (Signed)
[facility] PROGRESS NOTE  [name] QIO:[id number] DOB: [birth year] DOA: 10/08/2013 PCP: Stephens Shire, MD  Assessment/Plan: Acute on chronic renal failure (CKD stage III)  -multifactorial from renal parenchymal compression from hematoma and volume depletion  - history of decreased po intake  -Renal ultrasound--negative for hydronephrosis, right renal hyperechogenicity region-->CT abdomen and pelvis  -Baseline creatinine 1.0-1.3  Right perinephric hematoma  -10/09/2013 CT abdomen and pelvis showed right renal subcapsular hematoma with right renal parenchymal compression  -Appreciate Dr. Junious Silk consult  -d/c ASA/Plavix, enoxaparin  -may have been due to mechanical fall  -monitor H/H  -will de-escalate zosyn if blood culture remains neg in next 24hrs.  -Left subclavian and left circumflex stent placed 05/14/2012  Right renal pelvic stone -no hydronephrosis -appreciate Dr. Atilano Ina attempt JJ stent. -?contributing to renal failure--no dye noted from right ureteral orifice Abdominal distension -2-view abdomen xray Generalized weakness  -Multifactorial including possible UTI as well as acute on chronic renal failure and anemia  -PT evaluation-->HHPT  Fever  -in part due to R-perinephric hematoma  -Blood cultures x2 sets--neg to date  -Urine culture--no growth  -Empiric Zosyn given the patient's recent hospitalization and lithotripsy  -Influenza pcr--negative  Hypertension  -Discontinue Diovan as blood pressure is soft and with renal failure  -In light of the patient's renal artery stenosis, will not likely restart Diovan  -BP creeping up -start po anti-HTN if able to take po Diabetes mellitus type 2  -NovoLog sliding scale--change to moderate scale  -Hemoglobin A1c--7.9  -Discontinue metformin  CAD/history of stroke  -held Plavix and aspirin due to the patient's perinephric hematoma Seizure disorder  -Continue Topamax and Keppra  -Keppra has been  decreased to once daily due to the patient's renal failure  Atypical chest pain  -unclear if pt truly has cp  -cycle troponins--neg x 3  RUE pain and edema  -venous duplex--negative  Family Communication: Wife at beside  Disposition Plan: Home when medically stable  Antibiotics:  Zosyn 10/09/13>>>10/11/13 cipro 10/11/13          Procedures/Studies: Ct Abdomen Pelvis Wo Contrast  10/09/2013   CLINICAL DATA:  Abdominal pain and fever. Right perirenal hematoma. Recent lithotripsy.  EXAM: CT ABDOMEN AND PELVIS WITHOUT CONTRAST  TECHNIQUE: Multidetector CT imaging of the abdomen and pelvis was performed following the standard protocol without intravenous contrast.  COMPARISON:  CT scan dated 10/03/2013  FINDINGS: There are new tiny bilateral effusions with new slight right base atelectasis.  There is a prominent right renal subcapsular hematoma which completely surrounds the right kidney and compresses it.  There is a 23 x 11 x 6 mm stone fragment in the distal right renal pelvis. There are multiple small stone fragments in the right renal collecting system proximal to the stone. There is also blood in the exophytic cyst in the right upper pole of the kidney.  There is a small amount of blood extending along Gerota's fascia and along the right psoas muscle into the right side of the pelvis.  The liver and spleen and pancreas are normal. There are multiple small stones in the gallbladder. No biliary ductal dilatation. There is a 2.2 cm nodule in the in the right adrenal gland consistent with a benign adenoma.  No acute abnormality of the bowel. There are surgical staples at in the ascending colon.  There is slight irregular thickening of the bladder wall as previously noted. No acute osseous abnormality.  IMPRESSION: 1. Prominent right subcapsular hematoma of the right kidney compressing the renal parenchyma.  2. Large stone fragment in the inferior aspect of the right renal pelvis without obstruction.  Multiple smaller stone fragments in the proximal right renal collecting system. Critical Value/emergent results were called by telephone at the time of interpretation on 10/09/2013 at 10:34 PM to Junior, RN, who verbally acknowledged these results.   Electronically Signed   By: Rozetta Nunnery M.D.   On: 10/09/2013 22:35   Dg Chest 2 View  10/08/2013   CLINICAL DATA:  Fall, weakness  EXAM: CHEST  2 VIEW  COMPARISON:  09/07/2013  FINDINGS: Cardiomediastinal silhouette is stable. No acute infiltrate or pleural effusion. No pulmonary edema. Status post CABG. Bony thorax is stable.  IMPRESSION: No active disease.  Status post CABG again noted.   Electronically Signed   By: Lahoma Crocker M.D.   On: 10/08/2013 13:14   Dg Abd 1 View  10/06/2013   CLINICAL DATA:  Preoperative lithotripsy ; right-sided calculus  EXAM: ABDOMEN - 1 VIEW  COMPARISON:  CT abdomen and pelvis October 03, 2013  FINDINGS: There is a calcification in the region of the right renal pelvis measuring 1.4 x 1.1 cm. There are vascular calcifications pelvis. There is postoperative change in the right abdomen. Bowel gas pattern is within normal limits.  IMPRESSION: 1.4 x 1.1 cm calculus in the region of the right renal pelvis. Bowel gas pattern unremarkable.   Electronically Signed   By: Lowella Grip M.D.   On: 10/06/2013 10:07   Ct Head Wo Contrast  10/08/2013   CLINICAL DATA:  Weakness  EXAM: CT HEAD WITHOUT CONTRAST  TECHNIQUE: Contiguous axial images were obtained from the base of the skull through the vertex without intravenous contrast.  COMPARISON:  MRI 10/25/2012  FINDINGS: Chronic left MCA infarct involving the left insula and basal ganglia, unchanged from the prior study.  Negative for acute infarct. Negative for hemorrhage or mass. Mild atrophy and chronic microvascular ischemic changes in the white matter.  IMPRESSION: Chronic left MCA infarct.  No acute abnormality.   Electronically Signed   By: Franchot Gallo M.D.   On: 10/08/2013 12:22    Ct Abdomen Pelvis W Contrast  10/03/2013   CLINICAL DATA:  Abdominal pain, no bowel movement for 4 days  EXAM: CT ABDOMEN AND PELVIS WITH CONTRAST  TECHNIQUE: Multidetector CT imaging of the abdomen and pelvis was performed using the standard protocol following bolus administration of intravenous contrast.  CONTRAST:  170mL OMNIPAQUE IOHEXOL 300 MG/ML  SOLN  COMPARISON:  Abdominal radiograph 09/25/2013; prior CT abdomen/ pelvis 09/07/2013  FINDINGS: Lower Chest: Dependent atelectasis in the lower lobes. Visualized heart is within normal limits for size. No pericardial effusion. Atherosclerotic calcifications noted in the coronary arteries. Mild calcification of the aortic valve annulus. Unremarkable distal thoracic esophagus.  Abdomen: Unremarkable CT appearance of the stomach, duodenum, spleen, pancreas and left adrenal gland. Stable 1.5 cm low-attenuation nodule arising from the lateral limb of the right adrenal gland dating back to June of 2013. Stability is suggestive of a benign process such as adenoma. Normal hepatic contour and morphology. There is a single 8 mm enhancing nodule in the dome of the right liver (image 9 series 2) which is not identified on prior imaging although this may be secondary to differences in contrast timing.  Cholelithiasis without secondary signs to suggest acute cholecystitis. No intra or extrahepatic biliary ductal dilatation.  Stable 11 mm stone in the right renal pelvis. This is nonobstructing. A smaller 3 mm stone is noted in the right lower pole.  No calculi in the left kidney. Numerous bilateral renal cortical cysts are unchanged in size, number and configuration.  There are a few scattered colonic diverticula. No evidence of active inflammation to suggest acute diverticulitis. Surgical changes of prior appendectomy and partial see connecting the. The ileal colonic anastomosis is widely patent. The descending and sigmoid colon are decompressed. There is only a moderate  stool burden in the ascending and transverse colon. No free fluid or suspicious adenopathy.  Pelvis: Prostatomegaly. Prostate gland measures 5.3 x 5.0 cm and indents the bladder base. The bladder is distended with urine, however there is diffuse thickening of the bladder wall as well as a suggestion of trabeculation and increased enhancement. Findings are similar to the prior CT scan from 09/07/2013.  Bones/Soft Tissues: No acute fracture or aggressive appearing lytic or blastic osseous lesion. Multilevel degenerative disc disease and right worse than left lower lumbar facet arthropathy.  Vascular: Extensive atherosclerotic vascular disease. No aneurysmal dilatation. There is a moderate to high-grade stenosis of at the origin of the a right main renal artery. Likely mild-to-moderate stenosis at the origin of the left renal artery. Irregular atherosclerotic plaque noted in the infrarenal abdominal aorta. There is evidence of mild plaque ulceration as well. Irregular noncalcified fibro fatty plaque noted in the SMA approximately 3 cm from the origin results in at least moderate narrowing.  IMPRESSION: 1. No evidence of obstruction, or significant constipation/impaction. The overall stool burden is relatively low and predominantly in the ascending and transverse colon. The descending and sigmoid colons are decompressed. 2. Extensive atherosclerotic vascular disease including probable bilateral (right greater than left) renal artery stenoses, SMA stenosis, ulcerated plaque in the infrarenal abdominal aorta and coronary artery disease. 3. Irregular bladder wall thickening and enhancement similar to slightly improved compared to 09/07/2013. This may reflect recurrent or chronic cystitis. Additionally, an element of bladder outlet obstruction is suggested by prostatomegaly and likely trabeculation of the bladder wall. 4. An 8 mm arterially enhancing lesion is identified in the hepatic dome. This was not clearly seen on  prior studies. This could simply represent a flash fill capillary hemangioma, or transient hepatic attenuation difference which was not seen on prior imaging dated differences in contrast timing. However, given the patient's history of colon cancer and early metastatic lesion is not excluded. Recommend further evaluation with MRI of the abdomen with and without contrast in 3 months. 5. Nonobstructing right renal calculi. 6. Additional ancillary findings as above.   Electronically Signed   By: Jacqulynn Cadet M.D.   On: 10/03/2013 08:12   US Renal  10/08/2013   CLINICAL DATA:  Right-sided lithotripsy 10/06/13. Acute on chronic renal failure.  EXAM: RENAL/URINARY TRACT ULTRASOUND COMPLETE  COMPARISON:  DG ABD 2 VIEWS dated 10/08/2013; CT ABD/PELVIS W CM dated 10/03/2013; US ABDOMEN COMPLETE dated 09/08/2013  FINDINGS: Right Kidney: 12.4 cm. No hydronephrosis. Normal renal cortical thickness and echogenicity. An area of hyper echogenicity which measures 8.8 cm along the lateral aspect of the interpolar right kidney is without correlate on the recent CT.  A minimally complex cyst measures 2.9 cm in the upper pole right kidney.  Left Kidney: 12.7 cm. No hydronephrosis. Normal renal cortical thickness and echogenicity. Upper pole complex cyst measures 2.8 cm. An interpolar lateral cyst or complex cyst measures 1.4 cm.  Bladder: Wall thickening. This is likely secondary to prostatomegaly. Debris within the dependent bladder.  IMPRESSION: 1. No hydronephrosis or other explanation for renal insufficiency. 2. Peripheral right interpolar renal area of hyperechogenicity. No  correlate on the recent CT. This could be artifact or represent an area of infection or perirenal hematoma. Depending on clinical symptomatology, followup with CT should be considered. 3. Bilateral renal lesions, most consistent with cysts of varying complexity. 4. Prostatomegaly with likely secondary bladder outlet obstruction. Debris within the  dependent bladder for which superimposed infection cannot be excluded.   Electronically Signed   By: Abigail Miyamoto M.D.   On: 10/08/2013 19:46   Dg Abd 2 Views  10/08/2013   CLINICAL DATA:  Abdominal pain and fever.  EXAM: ABDOMEN - 2 VIEW  COMPARISON:  10/06/2013  FINDINGS: There is no free air or free fluid in the abdomen. The bowel gas pattern is normal. The right renal stone seen on the prior study and is now fragmented. There appear to be multiple stone fragments in the renal pelvis as well as a small fragment in the lower pole the right kidney. No discrete fragments along the course of the right ureter.  Vascular calcifications are present in the pelvis.  IMPRESSION: Multiple fragments of the right renal stone are seen in the region of the right renal pelvis as described. Otherwise benign appearing abdomen.   Electronically Signed   By: Rozetta Nunnery M.D.   On: 10/08/2013 18:12         Subjective: Patient denies any chest pain, shortness of breath, fevers, chills. He has had a couple episodes of dry heaves. He complains of right-sided abdominal pain. No dysuria.  Objective: Filed Vitals:   10/11/13 1235 10/11/13 1240 10/11/13 1245 10/11/13 1250  BP:   186/72   Pulse: 59 59 58   Temp:    98.1 F (36.7 C)  TempSrc:      Resp: 19 18 18    Height:      Weight:      SpO2: 97% 94% 96%     Intake/Output Summary (Last 24 hours) at 10/11/13 1435 Last data filed at 10/11/13 1248  Gross per 24 hour  Intake 3468.75 ml  Output      2 ml  Net 3466.75 ml   Weight change:  Exam:   General:  Pt is alert, follows commands appropriately, not in acute distress  [place]/AT  Cardiovascular: RRR, S1/S2, no rubs, no gallops  Respiratory: CTA bilaterally, no wheezing, no crackles, no rhonchi  Abdomen: Soft/+BS, right-sided tenderness with mild distention., no guarding  Extremities: No edema, No lymphangitis, No petechiae, No rashes, no synovitis  Data Reviewed: Basic Metabolic  Panel:  Recent Labs Lab 10/08/13 1230 10/09/13 0533 10/10/13 0447 10/11/13 0415  NA 138 138 137 139  K 4.2 3.8 3.8 3.9  CL 100 103 103 104  CO2 25 24 21 19   GLUCOSE 182* 167* 163* 143*  BUN 39* 38* 35* 34*  CREATININE 2.95* 2.89* 2.80* 2.64*  CALCIUM 8.9 7.7* 7.8* 7.8*   Liver Function Tests:  Recent Labs Lab 10/08/13 1230 10/09/13 0533  AST 17 12  ALT 8 7  ALKPHOS 74 60  BILITOT 0.4 0.6  PROT 6.5 5.2*  ALBUMIN 2.7* 2.1*    Recent Labs Lab 10/08/13 1230  LIPASE 14   No results found for this basename: AMMONIA,  in the last 168 hours CBC:  Recent Labs Lab 10/08/13 1230 10/10/13 0447 10/11/13 0415  WBC 11.7* 8.7 10.1  NEUTROABS 9.3*  --   --   HGB 11.5* 9.5* 10.5*  HCT 33.4* 27.5* 30.9*  MCV 82.5 82.3 82.4  PLT 137* 133* 181   Cardiac Enzymes:  Recent Labs Lab 10/08/13 1830 10/08/13 2330 10/09/13 0533  TROPONINI <0.30 <0.30 <0.30   BNP: No components found with this basename: POCBNP,  CBG:  Recent Labs Lab 10/10/13 1210 10/10/13 1705 10/10/13 2145 10/11/13 0811 10/11/13 1228  GLUCAP 128* 170* 123* 138* 158*    Recent Results (from the past 240 hour(s))  URINE CULTURE     Status: None   Collection Time    10/03/13  5:46 AM      Result Value Range Status   Specimen Description URINE, CLEAN CATCH   Final   Special Requests NONE   Final   Culture  Setup Time     Final   Value: 10/03/2013 08:58     Performed at [place]     Final   Value: 75,000 COLONIES/ML     Performed at [facility]   Culture     Final   Value: Multiple bacterial morphotypes present, none predominant. Suggest appropriate recollection if clinically indicated.     Performed at [facility]   Report Status 10/04/2013 FINAL   Final  URINE CULTURE     Status: None   Collection Time    10/08/13 12:48 PM      Result Value Range Status   Specimen Description URINE, CATHETERIZED   Final   Special Requests NONE   Final    Culture  Setup Time     Final   Value: 10/08/2013 17:45     Performed at [facility]     Final   Value: NO GROWTH     Performed at [facility]   Culture     Final   Value: NO GROWTH     Performed at [facility]   Report Status 10/09/2013 FINAL   Final  CULTURE, BLOOD (ROUTINE X 2)     Status: None   Collection Time    10/08/13  3:40 PM      Result Value Range Status   Specimen Description BLOOD BLOOD RIGHT WRIST   Final   Special Requests BOTTLES DRAWN AEROBIC AND ANAEROBIC 5CC   Final   Culture  Setup Time     Final   Value: 10/09/2013 00:15     Performed at [facility]   Culture     Final   Value:        BLOOD CULTURE RECEIVED NO GROWTH TO DATE CULTURE WILL BE HELD FOR 5 DAYS BEFORE ISSUING A FINAL NEGATIVE REPORT     Performed at [facility]   Report Status PENDING   Incomplete  CULTURE, BLOOD (ROUTINE X 2)     Status: None   Collection Time    10/08/13  3:46 PM      Result Value Range Status   Specimen Description BLOOD RIGHT HAND   Final   Special Requests BOTTLES DRAWN AEROBIC AND ANAEROBIC 5CC   Final   Culture  Setup Time     Final   Value: 10/09/2013 00:15     Performed at [facility]   Culture     Final   Value:        BLOOD CULTURE RECEIVED NO GROWTH TO DATE CULTURE WILL BE HELD FOR 5 DAYS BEFORE ISSUING A FINAL NEGATIVE REPORT     Performed at [facility]   Report Status PENDING   Incomplete  CLOSTRIDIUM DIFFICILE BY PCR     Status: None  Collection Time    10/10/13  1:28 PM      Result Value Range Status   C difficile by pcr NEGATIVE  NEGATIVE Final   Comment: Performed at [facility]  SURGICAL PCR SCREEN     Status: None   Collection Time    10/11/13  7:53 AM      Result Value Range Status   MRSA, PCR NEGATIVE  NEGATIVE Final   Staphylococcus aureus NEGATIVE  NEGATIVE Final   Comment:            The Xpert SA Assay (FDA     approved for NASAL specimens     in patients  over 64 years of age),     is one component of     a comprehensive surveillance     program.  Test performance has     been validated by [employer] for patients greater     than or equal to 48 year old.     It is not intended     to diagnose infection nor to     guide or monitor treatment.     Scheduled Meds: . ciprofloxacin  400 mg Intravenous 60 min Pre-Op  . escitalopram  10 mg Oral Daily  . finasteride  5 mg Oral Daily  . indigotindisulfonate  5 mL Intravenous Once  . insulin aspart  0-15 Units Subcutaneous TID WC  . levETIRAcetam  500 mg Oral Daily  . LORazepam  1 mg Oral QHS  . lubiprostone  8 mcg Oral BID WC  . multivitamin with minerals  1 tablet Oral Daily  . piperacillin-tazobactam (ZOSYN)  IV  3.375 g Intravenous Q8H  . sodium chloride  3 mL Intravenous Q12H  . topiramate  25 mg Oral BID  . vitamin C  500 mg Oral BID  . zinc sulfate  220 mg Oral Daily   Continuous Infusions: . sodium chloride 75 mL/hr at 10/11/13 0627     Mohmmad Saleeby, DO  [facility] Pager [phone]  If 7PM-7AM, please contact night-coverage [email or web address] Password [email or web address] 10/11/2013, 2:35 PM   LOS: 3 days

## 2013-10-11 NOTE — Anesthesia Preprocedure Evaluation (Addendum)
Anesthesia Evaluation  Patient identified by MRN, date of birth, ID band Patient awake    Reviewed: Allergy & Precautions, H&P , Patient's Chart, lab work & pertinent test results, reviewed documented beta blocker date and time   History of Anesthesia Complications Negative for: history of anesthetic complications  Airway Mallampati: II TM Distance: >3 FB Neck ROM: full    Dental   Pulmonary shortness of breath, COPDCurrent Smoker,  breath sounds clear to auscultation        Cardiovascular Exercise Tolerance: Good hypertension, + CAD, + Past MI and + Peripheral Vascular Disease Rhythm:regular Rate:Normal     Neuro/Psych Seizures -,  CVA negative psych ROS   GI/Hepatic   Endo/Other  diabetes  Renal/GU ARF and CRFRenal disease     Musculoskeletal   Abdominal   Peds  Hematology  (+) Blood dyscrasia, anemia ,   Anesthesia Other Findings Pseudobulbar affect Intractable seizures myoview showed in 2014 1. Small infarct along the inferior anterior wall. 2. No reversible ischemia. 3. Calculated ejection fraction of 52%. S/p CABG  Reproductive/Obstetrics                          Anesthesia Physical Anesthesia Plan  ASA: III  Anesthesia Plan: MAC   Post-op Pain Management:    Induction:   Airway Management Planned:   Additional Equipment:   Intra-op Plan:   Post-operative Plan:   Informed Consent: I have reviewed the patients History and Physical, chart, labs and discussed the procedure including the risks, benefits and alternatives for the proposed anesthesia with the patient or authorized representative who has indicated his/her understanding and acceptance.   Dental Advisory Given  Plan Discussed with: CRNA, Surgeon and Anesthesiologist  Anesthesia Plan Comments:        Anesthesia Quick Evaluation

## 2013-10-11 NOTE — Transfer of Care (Signed)
Immediate Anesthesia Transfer of Care Note  Patient: [name]  Procedure(s) Performed: Procedure(s): CYSTOSCOPY (Right)  Patient Location: PACU  Anesthesia Type:MAC  Level of Consciousness: sedated  Airway & Oxygen Therapy: Patient Spontanous Breathing and Patient connected to face mask oxygen  Post-op Assessment: Report given to PACU RN and Post -op Vital signs reviewed and stable  Post vital signs: Reviewed and stable  Complications: No apparent anesthesia complications

## 2013-10-11 NOTE — Progress Notes (Signed)
pacu nursing - Dr. Glennon Mac aware of elevated blood pressure - no new orders received Pt is at baseline mental status per anes. Ok to transfer back to room 1430

## 2013-10-11 NOTE — H&P (Signed)
  Date of Initial H&P: 1/23  History reviewed, patient re-examined, no change in status, stable for surgery.

## 2013-10-11 NOTE — Op Note (Signed)
[name] is a 73 y.o.   10/11/2013    Pre-op diagnosis: Right UPJ calculus. Renal insufficiency.  Postop diagnosis: Same  Procedure done: Cystoscopy, attempted right double-J stent insertion  Surgeon: Charlene Brooke. [name]  Anesthesia: Monitored anesthesia care  Indication: Patient is a 73 years old male who had ESL of a right renal calculus on 10/06/2013. He was admitted for generalized weakness and a fall. CT scan showed moderate right perinephric hematoma and a large fragment at the UPJ. There is no hydronephrosis. His creatinine increased to 2.8. He is scheduled for cystoscopy and insertion of double-J stent  Procedure: Patient was identified by his wrist band and proper timeout was taken.  Under general anesthesia he was prepped and draped and placed in the dorsolithotomy position. A panendoscope was inserted in the bladder. The anterior urethra is normal. He has trilobar prostatic hypertrophy with a large median lobe. The bladder is moderately trabeculated. There is some edema at the base of the bladder. It was difficult to identify the right ureteral orifice. The left ureteral orifice was visualized. Several attempts to catheterize what appeared to be the right ureteral orifice were unsuccessful. 1 ampule of indigo carmine was then injected intravenously. After 10 minutes blue dye came out of the left ureteral orifice. No dye was seen from the right orifice. I waited about an hour to see if I would see dye out of the right ureteral orifice but nothing came out. At that point the procedure was ended. If the patient's creatinine continued to go up he would need a percutaneous nephrostomy and antegrade insertion of double-J stent.  The patient tolerated the procedure well and was taken to postanesthesia care unit in satisfactory condition.

## 2013-10-12 DIAGNOSIS — R569 Unspecified convulsions: Secondary | ICD-10-CM

## 2013-10-12 LAB — HEPATIC FUNCTION PANEL
ALBUMIN: 1.9 g/dL — AB (ref 3.5–5.2)
ALT: 13 U/L (ref 0–53)
AST: 11 U/L (ref 0–37)
Alkaline Phosphatase: 179 U/L — ABNORMAL HIGH (ref 39–117)
Bilirubin, Direct: <0.2 mg/dL (ref 0.0–0.3)
Total Bilirubin: 0.5 mg/dL (ref 0.3–1.2)
Total Protein: 5.7 g/dL — ABNORMAL LOW (ref 6.0–8.3)

## 2013-10-12 LAB — CBC
HCT: 30.9 % — ABNORMAL LOW (ref 39.0–52.0)
Hemoglobin: 10.4 g/dL — ABNORMAL LOW (ref 13.0–17.0)
MCH: 28 pg (ref 26.0–34.0)
MCHC: 33.7 g/dL (ref 30.0–36.0)
MCV: 83.3 fL (ref 78.0–100.0)
Platelets: 212 10*3/uL (ref 150–400)
RBC: 3.71 MIL/uL — ABNORMAL LOW (ref 4.22–5.81)
RDW: 13.1 % (ref 11.5–15.5)
WBC: 10.5 10*3/uL (ref 4.0–10.5)

## 2013-10-12 LAB — BASIC METABOLIC PANEL
BUN: 35 mg/dL — AB (ref 6–23)
CO2: 20 mEq/L (ref 19–32)
Calcium: 7.9 mg/dL — ABNORMAL LOW (ref 8.4–10.5)
Chloride: 107 mEq/L (ref 96–112)
Creatinine, Ser: 2.47 mg/dL — ABNORMAL HIGH (ref 0.50–1.35)
GFR calc non Af Amer: 25 mL/min — ABNORMAL LOW (ref >90)
GFR, EST AFRICAN AMERICAN: 29 mL/min — AB (ref >90)
Glucose, Bld: 138 mg/dL — ABNORMAL HIGH (ref 70–99)
Potassium: 4.1 mEq/L (ref 3.7–5.3)
Sodium: 140 mEq/L (ref 137–147)

## 2013-10-12 LAB — GLUCOSE, CAPILLARY
GLUCOSE-CAPILLARY: 106 mg/dL — AB (ref 70–99)
GLUCOSE-CAPILLARY: 141 mg/dL — AB (ref 70–99)
GLUCOSE-CAPILLARY: 195 mg/dL — AB (ref 70–99)
Glucose-Capillary: 117 mg/dL — ABNORMAL HIGH (ref 70–99)

## 2013-10-12 NOTE — Progress Notes (Signed)
[facility] PROGRESS NOTE  Everet Flagg Bonura SWN:[id number] DOB: Jul 05, 1941 DOA: 10/08/2013 PCP: Stephens Shire, MD  Assessment/Plan: Acute on chronic renal failure (CKD stage III)  -multifactorial from renal parenchymal compression from hematoma and volume depletion  - history of decreased po intake  -Renal ultrasound--negative for hydronephrosis, right renal hyperechogenicity region-->CT abdomen and pelvis  -Baseline creatinine 1.0-1.3  Right perinephric hematoma  -10/09/2013 CT abdomen and pelvis showed right renal subcapsular hematoma with right renal parenchymal compression  -Appreciate Dr. Junious Silk consult  -d/c ASA/Plavix, enoxaparin  -may have been due to mechanical fall  -monitor H/H--stable -Left subclavian and left circumflex stent placed 05/14/2012  Right renal pelvic stone  -no hydronephrosis  -appreciate Dr. Atilano Ina attempt JJ stent.  -?contributing to renal failure--no dye noted from right ureteral orifice  Abdominal distension  -2-view abdomen xray--nonobstructive bowel gas pattern  Generalized weakness  -Multifactorial including possible UTI as well as acute on chronic renal failure and anemia  -PT evaluation-->HHPT  Fever  -in part due to R-perinephric hematoma  -Blood cultures x2 sets--neg to date  -Urine culture--no growth  -Empiric Zosyn given the patient's recent hospitalization and lithotripsy  -Influenza pcr--negative  Hypertension  -Discontinue Diovan as blood pressure is soft and with renal failure  -In light of the patient's renal artery stenosis, will not likely restart Diovan  -BP creeping up  -start po anti-HTN if able to take po  Diabetes mellitus type 2  -NovoLog sliding scale--change to moderate scale  -Hemoglobin A1c--7.9  -Discontinue metformin  CAD/history of stroke  -held Plavix and aspirin due to the patient's perinephric hematoma  Seizure disorder  -Continue Topamax and Keppra  -Keppra has been decreased to once  daily due to the patient's renal failure  Atypical chest pain  -unclear if pt truly has cp  -cycle troponins--neg x 3  RUE pain and edema  -venous duplex--negative  Goals of Care -Long discussion with the patient's daughter (POA) and sister -they feel the patient is suffering with limited quality of life -They want to transition to a focus of comfort care -They did not want any additional testing, labs, and they desire transfer to SNF with hospice  Vs residential hospice -Discontinue telemetry and further lab work Family Communication: Wife And daughter at beside--total time spent 40 minutes Disposition Plan:  Residential hospice Antibiotics:  Zosyn 10/09/13>>>10/11/13  cipro 10/11/13          Procedures/Studies: Ct Abdomen Pelvis Wo Contrast  10/09/2013   CLINICAL DATA:  Abdominal pain and fever. Right perirenal hematoma. Recent lithotripsy.  EXAM: CT ABDOMEN AND PELVIS WITHOUT CONTRAST  TECHNIQUE: Multidetector CT imaging of the abdomen and pelvis was performed following the standard protocol without intravenous contrast.  COMPARISON:  CT scan dated 10/03/2013  FINDINGS: There are new tiny bilateral effusions with new slight right base atelectasis.  There is a prominent right renal subcapsular hematoma which completely surrounds the right kidney and compresses it.  There is a 23 x 11 x 6 mm stone fragment in the distal right renal pelvis. There are multiple small stone fragments in the right renal collecting system proximal to the stone. There is also blood in the exophytic cyst in the right upper pole of the kidney.  There is a small amount of blood extending along Gerota's fascia and along the right psoas muscle into the right side of the pelvis.  The liver and spleen and pancreas are normal. There are multiple small stones in the gallbladder. No biliary ductal dilatation. There is a 2.2  cm nodule in the in the right adrenal gland consistent with a benign adenoma.  No acute abnormality  of the bowel. There are surgical staples at in the ascending colon.  There is slight irregular thickening of the bladder wall as previously noted. No acute osseous abnormality.  IMPRESSION: 1. Prominent right subcapsular hematoma of the right kidney compressing the renal parenchyma. 2. Large stone fragment in the inferior aspect of the right renal pelvis without obstruction. Multiple smaller stone fragments in the proximal right renal collecting system. Critical Value/emergent results were called by telephone at the time of interpretation on 10/09/2013 at 10:34 PM to Junior, RN, who verbally acknowledged these results.   Electronically Signed   By: Rozetta Nunnery M.D.   On: 10/09/2013 22:35   Dg Chest 2 View  10/08/2013   CLINICAL DATA:  Fall, weakness  EXAM: CHEST  2 VIEW  COMPARISON:  09/07/2013  FINDINGS: Cardiomediastinal silhouette is stable. No acute infiltrate or pleural effusion. No pulmonary edema. Status post CABG. Bony thorax is stable.  IMPRESSION: No active disease.  Status post CABG again noted.   Electronically Signed   By: Lahoma Crocker M.D.   On: 10/08/2013 13:14   Dg Abd 1 View  10/06/2013   CLINICAL DATA:  Preoperative lithotripsy ; right-sided calculus  EXAM: ABDOMEN - 1 VIEW  COMPARISON:  CT abdomen and pelvis October 03, 2013  FINDINGS: There is a calcification in the region of the right renal pelvis measuring 1.4 x 1.1 cm. There are vascular calcifications pelvis. There is postoperative change in the right abdomen. Bowel gas pattern is within normal limits.  IMPRESSION: 1.4 x 1.1 cm calculus in the region of the right renal pelvis. Bowel gas pattern unremarkable.   Electronically Signed   By: Lowella Grip M.D.   On: 10/06/2013 10:07   Ct Head Wo Contrast  10/08/2013   CLINICAL DATA:  Weakness  EXAM: CT HEAD WITHOUT CONTRAST  TECHNIQUE: Contiguous axial images were obtained from the base of the skull through the vertex without intravenous contrast.  COMPARISON:  MRI 10/25/2012   FINDINGS: Chronic left MCA infarct involving the left insula and basal ganglia, unchanged from the prior study.  Negative for acute infarct. Negative for hemorrhage or mass. Mild atrophy and chronic microvascular ischemic changes in the white matter.  IMPRESSION: Chronic left MCA infarct.  No acute abnormality.   Electronically Signed   By: Franchot Gallo M.D.   On: 10/08/2013 12:22   Ct Abdomen Pelvis W Contrast  10/03/2013   CLINICAL DATA:  Abdominal pain, no bowel movement for 4 days  EXAM: CT ABDOMEN AND PELVIS WITH CONTRAST  TECHNIQUE: Multidetector CT imaging of the abdomen and pelvis was performed using the standard protocol following bolus administration of intravenous contrast.  CONTRAST:  160mL OMNIPAQUE IOHEXOL 300 MG/ML  SOLN  COMPARISON:  Abdominal radiograph 09/25/2013; prior CT abdomen/ pelvis 09/07/2013  FINDINGS: Lower Chest: Dependent atelectasis in the lower lobes. Visualized heart is within normal limits for size. No pericardial effusion. Atherosclerotic calcifications noted in the coronary arteries. Mild calcification of the aortic valve annulus. Unremarkable distal thoracic esophagus.  Abdomen: Unremarkable CT appearance of the stomach, duodenum, spleen, pancreas and left adrenal gland. Stable 1.5 cm low-attenuation nodule arising from the lateral limb of the right adrenal gland dating back to June of 2013. Stability is suggestive of a benign process such as adenoma. Normal hepatic contour and morphology. There is a single 8 mm enhancing nodule in the dome of the right liver (  image 9 series 2) which is not identified on prior imaging although this may be secondary to differences in contrast timing.  Cholelithiasis without secondary signs to suggest acute cholecystitis. No intra or extrahepatic biliary ductal dilatation.  Stable 11 mm stone in the right renal pelvis. This is nonobstructing. A smaller 3 mm stone is noted in the right lower pole. No calculi in the left kidney. Numerous  bilateral renal cortical cysts are unchanged in size, number and configuration.  There are a few scattered colonic diverticula. No evidence of active inflammation to suggest acute diverticulitis. Surgical changes of prior appendectomy and partial see connecting the. The ileal colonic anastomosis is widely patent. The descending and sigmoid colon are decompressed. There is only a moderate stool burden in the ascending and transverse colon. No free fluid or suspicious adenopathy.  Pelvis: Prostatomegaly. Prostate gland measures 5.3 x 5.0 cm and indents the bladder base. The bladder is distended with urine, however there is diffuse thickening of the bladder wall as well as a suggestion of trabeculation and increased enhancement. Findings are similar to the prior CT scan from 09/07/2013.  Bones/Soft Tissues: No acute fracture or aggressive appearing lytic or blastic osseous lesion. Multilevel degenerative disc disease and right worse than left lower lumbar facet arthropathy.  Vascular: Extensive atherosclerotic vascular disease. No aneurysmal dilatation. There is a moderate to high-grade stenosis of at the origin of the a right main renal artery. Likely mild-to-moderate stenosis at the origin of the left renal artery. Irregular atherosclerotic plaque noted in the infrarenal abdominal aorta. There is evidence of mild plaque ulceration as well. Irregular noncalcified fibro fatty plaque noted in the SMA approximately 3 cm from the origin results in at least moderate narrowing.  IMPRESSION: 1. No evidence of obstruction, or significant constipation/impaction. The overall stool burden is relatively low and predominantly in the ascending and transverse colon. The descending and sigmoid colons are decompressed. 2. Extensive atherosclerotic vascular disease including probable bilateral (right greater than left) renal artery stenoses, SMA stenosis, ulcerated plaque in the infrarenal abdominal aorta and coronary artery disease.  3. Irregular bladder wall thickening and enhancement similar to slightly improved compared to 09/07/2013. This may reflect recurrent or chronic cystitis. Additionally, an element of bladder outlet obstruction is suggested by prostatomegaly and likely trabeculation of the bladder wall. 4. An 8 mm arterially enhancing lesion is identified in the hepatic dome. This was not clearly seen on prior studies. This could simply represent a flash fill capillary hemangioma, or transient hepatic attenuation difference which was not seen on prior imaging dated differences in contrast timing. However, given the patient's history of colon cancer and early metastatic lesion is not excluded. Recommend further evaluation with MRI of the abdomen with and without contrast in 3 months. 5. Nonobstructing right renal calculi. 6. Additional ancillary findings as above.   Electronically Signed   By: Jacqulynn Cadet M.D.   On: 10/03/2013 08:12   US Renal  10/08/2013   CLINICAL DATA:  Right-sided lithotripsy 10/06/13. Acute on chronic renal failure.  EXAM: RENAL/URINARY TRACT ULTRASOUND COMPLETE  COMPARISON:  DG ABD 2 VIEWS dated 10/08/2013; CT ABD/PELVIS W CM dated 10/03/2013; US ABDOMEN COMPLETE dated 09/08/2013  FINDINGS: Right Kidney: 12.4 cm. No hydronephrosis. Normal renal cortical thickness and echogenicity. An area of hyper echogenicity which measures 8.8 cm along the lateral aspect of the interpolar right kidney is without correlate on the recent CT.  A minimally complex cyst measures 2.9 cm in the upper pole right kidney.  Left Kidney:  12.7 cm. No hydronephrosis. Normal renal cortical thickness and echogenicity. Upper pole complex cyst measures 2.8 cm. An interpolar lateral cyst or complex cyst measures 1.4 cm.  Bladder: Wall thickening. This is likely secondary to prostatomegaly. Debris within the dependent bladder.  IMPRESSION: 1. No hydronephrosis or other explanation for renal insufficiency. 2. Peripheral right interpolar  renal area of hyperechogenicity. No correlate on the recent CT. This could be artifact or represent an area of infection or perirenal hematoma. Depending on clinical symptomatology, followup with CT should be considered. 3. Bilateral renal lesions, most consistent with cysts of varying complexity. 4. Prostatomegaly with likely secondary bladder outlet obstruction. Debris within the dependent bladder for which superimposed infection cannot be excluded.   Electronically Signed   By: Abigail Miyamoto M.D.   On: 10/08/2013 19:46   Dg Abd 2 Views  10/11/2013   CLINICAL DATA:  Abdominal distention and vomiting.  EXAM: ABDOMEN - 2 VIEW  COMPARISON:  None.  FINDINGS: Gaseous distention of the stomach is noted. Scattered air-fluid levels are seen within small bowel and colon, without significant small bowel or colonic dilatation. This is a nonspecific, nonobstructive bowel gas pattern. No evidence of free air.  IMPRESSION: Nonspecific, nonobstructive bowel gas pattern.   Electronically Signed   By: Earle Gell M.D.   On: 10/11/2013 16:40   Dg Abd 2 Views  10/08/2013   CLINICAL DATA:  Abdominal pain and fever.  EXAM: ABDOMEN - 2 VIEW  COMPARISON:  10/06/2013  FINDINGS: There is no free air or free fluid in the abdomen. The bowel gas pattern is normal. The right renal stone seen on the prior study and is now fragmented. There appear to be multiple stone fragments in the renal pelvis as well as a small fragment in the lower pole the right kidney. No discrete fragments along the course of the right ureter.  Vascular calcifications are present in the pelvis.  IMPRESSION: Multiple fragments of the right renal stone are seen in the region of the right renal pelvis as described. Otherwise benign appearing abdomen.   Electronically Signed   By: Rozetta Nunnery M.D.   On: 10/08/2013 18:12         Subjective: Vision was resting comfortably. He denies any fevers, chills, chest pain, vomiting, diarrhea, abdominal pain. He  complains of little shortness of breath without any hemoptysis or coughing.  Objective: Filed Vitals:   10/11/13 2030 10/11/13 2300 10/12/13 0418 10/12/13 1404  BP: 194/67 167/69 178/63 182/59  Pulse: 69  63 73  Temp: 98.7 F (37.1 C)  98.7 F (37.1 C) 99.5 F (37.5 C)  TempSrc: Oral  Oral Oral  Resp: 18  18 22   Height:      Weight:   77.8 kg (171 lb 8.3 oz)   SpO2: 97%  100% 98%    Intake/Output Summary (Last 24 hours) at 10/12/13 1734 Last data filed at 10/12/13 1300  Gross per 24 hour  Intake 1836.25 ml  Output      0 ml  Net 1836.25 ml   Weight change:  Exam:   General:  Pt is alert, follows commands appropriately, not in acute distress  HEENT: No icterus, No thrush,  [place]/AT  Cardiovascular: RRR, S1/S2, no rubs, no gallops  Respiratory: Bibasilar crackles. No wheezing. Good air movement.  Abdomen: Soft/+BS, non tender, non distended, no guarding  Extremities: No edema, No lymphangitis, No petechiae, No rashes, no synovitis  Data Reviewed: Basic Metabolic Panel:  Recent Labs Lab 10/08/13 1230 10/09/13  0533 10/10/13 0447 10/11/13 0415 10/12/13 0436  NA 138 138 137 139 140  K 4.2 3.8 3.8 3.9 4.1  CL 100 103 103 104 107  CO2 25 24 21 19 20   GLUCOSE 182* 167* 163* 143* 138*  BUN 39* 38* 35* 34* 35*  CREATININE 2.95* 2.89* 2.80* 2.64* 2.47*  CALCIUM 8.9 7.7* 7.8* 7.8* 7.9*   Liver Function Tests:  Recent Labs Lab 10/08/13 1230 10/09/13 0533 10/12/13 0436  AST 17 12 11   ALT 8 7 13   ALKPHOS 74 60 179*  BILITOT 0.4 0.6 0.5  PROT 6.5 5.2* 5.7*  ALBUMIN 2.7* 2.1* 1.9*    Recent Labs Lab 10/08/13 1230  LIPASE 14   No results found for this basename: AMMONIA,  in the last 168 hours CBC:  Recent Labs Lab 10/08/13 1230 10/10/13 0447 10/11/13 0415 10/12/13 0436  WBC 11.7* 8.7 10.1 10.5  NEUTROABS 9.3*  --   --   --   HGB 11.5* 9.5* 10.5* 10.4*  HCT 33.4* 27.5* 30.9* 30.9*  MCV 82.5 82.3 82.4 83.3  PLT 137* 133* 181 212   Cardiac  Enzymes:  Recent Labs Lab 10/08/13 1830 10/08/13 2330 10/09/13 0533  TROPONINI <0.30 <0.30 <0.30   BNP: No components found with this basename: POCBNP,  CBG:  Recent Labs Lab 10/11/13 1644 10/11/13 2203 10/12/13 0759 10/12/13 1213 10/12/13 1648  GLUCAP 142* 152* 106* 117* 141*    Recent Results (from the past 240 hour(s))  URINE CULTURE     Status: None   Collection Time    10/03/13  5:46 AM      Result Value Range Status   Specimen Description URINE, CLEAN CATCH   Final   Special Requests NONE   Final   Culture  Setup Time     Final   Value: 10/03/2013 08:58     Performed at [place]     Final   Value: 75,000 COLONIES/ML     Performed at [facility]   Culture     Final   Value: Multiple bacterial morphotypes present, none predominant. Suggest appropriate recollection if clinically indicated.     Performed at [facility]   Report Status 10/04/2013 FINAL   Final  URINE CULTURE     Status: None   Collection Time    10/08/13 12:48 PM      Result Value Range Status   Specimen Description URINE, CATHETERIZED   Final   Special Requests NONE   Final   Culture  Setup Time     Final   Value: 10/08/2013 17:45     Performed at [place]     Final   Value: NO GROWTH     Performed at [facility]   Culture     Final   Value: NO GROWTH     Performed at [facility]   Report Status 10/09/2013 FINAL   Final  CULTURE, BLOOD (ROUTINE X 2)     Status: None   Collection Time    10/08/13  3:40 PM      Result Value Range Status   Specimen Description BLOOD BLOOD RIGHT WRIST   Final   Special Requests BOTTLES DRAWN AEROBIC AND ANAEROBIC 5CC   Final   Culture  Setup Time     Final   Value: 10/09/2013 00:15     Performed at [facility]  Final   Value:        BLOOD CULTURE RECEIVED NO GROWTH TO DATE CULTURE WILL BE HELD FOR 5 DAYS BEFORE ISSUING A FINAL NEGATIVE REPORT      Performed at [facility]   Report Status PENDING   Incomplete  CULTURE, BLOOD (ROUTINE X 2)     Status: None   Collection Time    10/08/13  3:46 PM      Result Value Range Status   Specimen Description BLOOD RIGHT HAND   Final   Special Requests BOTTLES DRAWN AEROBIC AND ANAEROBIC 5CC   Final   Culture  Setup Time     Final   Value: 10/09/2013 00:15     Performed at [facility]   Culture     Final   Value:        BLOOD CULTURE RECEIVED NO GROWTH TO DATE CULTURE WILL BE HELD FOR 5 DAYS BEFORE ISSUING A FINAL NEGATIVE REPORT     Performed at [facility]   Report Status PENDING   Incomplete  CLOSTRIDIUM DIFFICILE BY PCR     Status: None   Collection Time    10/10/13  1:28 PM      Result Value Range Status   C difficile by pcr NEGATIVE  NEGATIVE Final   Comment: Performed at [facility]  SURGICAL PCR SCREEN     Status: None   Collection Time    10/11/13  7:53 AM      Result Value Range Status   MRSA, PCR NEGATIVE  NEGATIVE Final   Staphylococcus aureus NEGATIVE  NEGATIVE Final   Comment:            The Xpert SA Assay (FDA     approved for NASAL specimens     in patients over 46 years of age),     is one component of     a comprehensive surveillance     program.  Test performance has     been validated by [employer] for patients greater     than or equal to 41 year old.     It is not intended     to diagnose infection nor to     guide or monitor treatment.     Scheduled Meds: . ciprofloxacin  400 mg Intravenous 60 min Pre-Op  . indigotindisulfonate  5 mL Intravenous Once  . levETIRAcetam  500 mg Oral Daily  . LORazepam  1 mg Oral QHS  . sodium chloride  3 mL Intravenous Q12H  . topiramate  25 mg Oral BID   Continuous Infusions:    Eero Dini, DO  [facility] Pager [phone]  If 7PM-7AM, please contact night-coverage [email or web address] Password [facility] 10/12/2013, 5:34 PM   LOS: 4 days

## 2013-10-12 NOTE — Discharge Summary (Addendum)
Physician Discharge Summary  [name] [id number] DOB: [birth year] DOA: 10/08/2013  PCP: Stephens Shire, MD  Admit date: 10/08/2013 Discharge date: 10/14/13   Patient is being transitioned to residential hospice care with a focus on comfort care.   Discharge Diagnoses:  Active Problems:   DM (diabetes mellitus), type 2, uncontrolled   HTN (hypertension), benign   Expressive aphasia   Abdominal pain   H/O: CVA (cerebrovascular accident)   Chest pain, atypical   Seizure   Acute on chronic renal failure   Perinephric hematoma   Nephrolithiasis Acute on chronic renal failure (CKD stage III)  -multifactorial from renal parenchymal compression from hematoma and volume depletion  - history of decreased po intake  -Renal ultrasound--negative for hydronephrosis, right renal hyperechogenicity region-->CT abdomen and pelvis  -Baseline creatinine 1.0-1.3  -Renal function is slowly improving -No longer an active issue as the patient is being transitioned to a focus corrected on comfort care Right perinephric hematoma  -10/09/2013 CT abdomen and pelvis showed right renal subcapsular hematoma with right renal parenchymal compression  -Appreciate Dr. Junious Silk consult  -d/c ASA/Plavix, enoxaparin  -may have been due to mechanical fall  -monitor H/H--stable  -Left subclavian and left circumflex stent placed 05/14/2012  --No longer an active issue as the patient is being transitioned to a focus corrected on comfort care Right renal pelvic stone  -no hydronephrosis  -appreciate Dr. Atilano Ina attempt JJ stent.  -?contributing to renal failure--no dye noted from right ureteral orifice  -No longer an active issue as the patient is being transitioned to a focus corrected on comfort care Abdominal distension  -2-view abdomen xray--nonobstructive bowel gas pattern  Generalized weakness  -Multifactorial including possible UTI as well as acute on chronic renal failure and anemia   -PT evaluation-->HHPT  Fever  -in part due to R-perinephric hematoma  -Blood cultures x2 sets--neg to date  -Urine culture--no growth  -Empiric Zosyn given the patient's recent hospitalization and lithotripsy  -Influenza pcr--negative  -Patient remained afebrile and hemodynamically stable for the duration of his hospitalization Hypertension  -Discontinue Diovan as blood pressure is soft and with renal failure  -In light of the patient's renal artery stenosis, will not likely restart Diovan  -BP creeping up  -start po anti-HTN if able to take po  -As the patient's focus of care has been transitioned to comfort,  his antihypertensive medication has been discontinued Diabetes mellitus type 2  -NovoLog sliding scale--change to moderate scale  -Hemoglobin A1c--7.9  -Discontinue metformin  --No longer an active issue as the patient is being transitioned to a focus corrected on comfort care--> discontinued all CBG checks  CAD/history of stroke  -held Plavix and aspirin due to the patient's perinephric hematoma  Seizure disorder  -Continue Topamax and Keppra  -Keppra has been decreased to once daily due to the patient's renal failure  -At the time of discharge, his antiepileptic medications were discontinued as the patient had not been taking his medications at home anyway for the past 2 months due to financial issues Atypical chest pain  -unclear if pt truly has cp  -cycle troponins--neg x 3  RUE pain and edema  -venous duplex--negative  Goals of Care  -Long discussion with the patient's daughter (POA) and sister  -they feel the patient is suffering with limited quality of life  -They want to transition to a focus of comfort care  -They did not want any additional testing, labs, and they desire transfer to SNF with hospice vs residential hospice  -Discontinue  telemetry and further lab work   Family Communication: Wife And daughter at beside--total time spent 40 minutes  Disposition  Plan: Residential hospice  Antibiotics:  Zosyn 10/09/13>>>10/11/13  cipro 10/11/13   Discharge Condition: stable Disposition:  Follow-up Information   Call Eskridge, Lowella Petties, MD. (for follow-up in 4-6 weeks if symptoms worsen)    Specialty:  Urology   Contact information:   [address] [facility]  [place] [address] [phone]      residential hospice   Diet: Regular Wt Readings from Last 3 Encounters:  10/12/13 77.8 kg (171 lb 8.3 oz)  10/12/13 77.8 kg (171 lb 8.3 oz)  10/06/13 71.385 kg (157 lb 6 oz)    History of present illness:  73 year old male with a history of hypertension, diabetes mellitus, stroke, seizure disorder, COPD presents after a mechanical fall early in the morning on the day of admission. The patient's family tried to get him up but was unable to get the patient up. As a result, EMS was activated. There was no syncope. Even prior to the fall, the patient has had a history of generalized weakness. However he required a max assist today without success. The patient had lithotripsy on 10/06/2013 and was at home with Macrobid. The patient is a poor historian. This history is obtained from review of the chart and speaking with EDP. The patient's wife at the bedside is also a poor historian. Apparently, the patient has been having chest discomfort and generalized weakness for the better part of the week. However, things have significantly worsened since his lithotripsy on 10/06/2013. There've been no reports of fevers, chills, shortness of breath, nausea, vomiting, diarrhea, dysuria, hematochezia, melena. The patient has chronic abdominal pain for which CT of the abdomen and pelvis on 10/03/2013 was obtained. 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 After lithotripsy, the patient was sent home with Macrobid. He endorses compliance.  -Renal ultrasound--negative for  hydronephrosis, right renal hyperechogenicity region-->CT abdomen and pelvis.  A CT of the abdomen and pelvis revealed a right perinephric hematoma causing compression on the right kidney. This was thought to be contributing to the patient's renal failure. In addition the patient also had a right ureteropelvic stone. Dr. Brunilda Payor attempted to place a right ureteral stent without success. The patient was continued on IV fluids and  intravenous antibiotics. However, a long discussion was undertaken with the patient's family and daughter. They decided to transition the patient's care for a focus on comfort. They agreed to placement at residential hospice. All labs, blood draws, radiographs were discontinued.     Consultants: Urology-Drs. Eskridge and Nesi  Discharge Exam: Filed Vitals:   10/14/13 1412  BP: 156/74  Pulse: 72  Temp: 98.8 F (37.1 C)  Resp: 16   Filed Vitals:   10/13/13 1422 10/13/13 2100 10/14/13 0500 10/14/13 1412  BP: 173/74 160/71 162/73 156/74  Pulse: 89 82 79 72  Temp: 98.4 F (36.9 C) 98.7 F (37.1 C) 98.7 F (37.1 C) 98.8 F (37.1 C)  TempSrc: Oral Oral Oral Oral  Resp: 18 17 16 16   Height:      Weight:      SpO2: 100% 95% 97% 98%   General: Awake and alert, NAD, pleasant, cooperative Cardiovascular: RRR, no rub, no gallop, no S3 Respiratory: bibasilar crackles. No wheezing. Good air movement.  Abdomen:soft,RUQ, R-flank tender without rebound, nondistended, positive bowel sounds Extremities: trace LE edema, No lymphangitis, no petechiae  Discharge Instructions      Discharge Orders   Future Orders Complete By Expires   Diet general  As directed    Increase activity slowly  As directed        Medication List    STOP taking these medications       albuterol 108 (90 BASE) MCG/ACT inhaler  Commonly known as:  PROVENTIL HFA;VENTOLIN HFA     aspirin EC 81 MG tablet     clopidogrel 75 MG tablet  Commonly known as:  PLAVIX     escitalopram 10 MG  tablet  Commonly known as:  LEXAPRO     finasteride 5 MG tablet  Commonly known as:  PROSCAR     levETIRAcetam 500 MG tablet  Commonly known as:  KEPPRA     lubiprostone 8 MCG capsule  Commonly known as:  AMITIZA     multivitamin with minerals Tabs tablet     nitrofurantoin (macrocrystal-monohydrate) 100 MG capsule  Commonly known as:  MACROBID     nitroGLYCERIN 0.4 MG SL tablet  Commonly known as:  NITROSTAT     oxazepam 30 MG capsule  Commonly known as:  SERAX  Replaced by:  LORazepam 1 MG tablet     polyethylene glycol packet  Commonly known as:  MIRALAX     sitaGLIPtin-metformin 50-1000 MG per tablet  Commonly known as:  JANUMET     topiramate 25 MG tablet  Commonly known as:  TOPAMAX     valsartan 160 MG tablet  Commonly known as:  DIOVAN     vitamin C 500 MG tablet  Commonly known as:  ASCORBIC ACID     zinc sulfate 220 MG capsule      TAKE these medications       LORazepam 1 MG tablet  Commonly known as:  ATIVAN  Take 1 tablet (1 mg total) by mouth at bedtime.     morphine CONCENTRATE 10 mg / 0.5 ml concentrated solution  Take 0.2 mLs (4 mg total) by mouth every 2 (two) hours as needed for severe pain.     ondansetron 4 MG disintegrating tablet  Commonly known as:  ZOFRAN ODT  Take 1 tablet (4 mg total) by mouth every 8 (eight) hours as needed for nausea or vomiting.     oxyCODONE-acetaminophen 5-325 MG per tablet  Commonly known as:  ROXICET  Take 1 tablet by mouth every 4 (four) hours as needed for severe pain.     promethazine 25 MG tablet  Commonly known as:  PHENERGAN  Take 25 mg by mouth every 6 (six) hours as needed. For nausea.         The results of significant diagnostics from this hospitalization (including imaging, microbiology, ancillary and laboratory) are listed below for reference.    Significant Diagnostic Studies: Ct Abdomen Pelvis Wo Contrast  10/09/2013   CLINICAL DATA:  Abdominal pain and fever. Right perirenal  hematoma. Recent lithotripsy.  EXAM: CT ABDOMEN AND PELVIS WITHOUT CONTRAST  TECHNIQUE: Multidetector CT imaging of the abdomen and pelvis was performed following the standard protocol without intravenous contrast.  COMPARISON:  CT scan dated 10/03/2013  FINDINGS: There are new tiny bilateral effusions with new slight right base atelectasis.  There is a prominent right renal subcapsular hematoma which completely surrounds the right kidney and compresses it.  There is a 23 x 11 x 6 mm stone fragment in the distal right renal pelvis. There are multiple small stone fragments in the right renal collecting system  proximal to the stone. There is also blood in the exophytic cyst in the right upper pole of the kidney.  There is a small amount of blood extending along Gerota's fascia and along the right psoas muscle into the right side of the pelvis.  The liver and spleen and pancreas are normal. There are multiple small stones in the gallbladder. No biliary ductal dilatation. There is a 2.2 cm nodule in the in the right adrenal gland consistent with a benign adenoma.  No acute abnormality of the bowel. There are surgical staples at in the ascending colon.  There is slight irregular thickening of the bladder wall as previously noted. No acute osseous abnormality.  IMPRESSION: 1. Prominent right subcapsular hematoma of the right kidney compressing the renal parenchyma. 2. Large stone fragment in the inferior aspect of the right renal pelvis without obstruction. Multiple smaller stone fragments in the proximal right renal collecting system. Critical Value/emergent results were called by telephone at the time of interpretation on 10/09/2013 at 10:34 PM to Junior, RN, who verbally acknowledged these results.   Electronically Signed   By: Rozetta Nunnery M.D.   On: 10/09/2013 22:35   Dg Chest 2 View  10/08/2013   CLINICAL DATA:  Fall, weakness  EXAM: CHEST  2 VIEW  COMPARISON:  09/07/2013  FINDINGS: Cardiomediastinal silhouette is  stable. No acute infiltrate or pleural effusion. No pulmonary edema. Status post CABG. Bony thorax is stable.  IMPRESSION: No active disease.  Status post CABG again noted.   Electronically Signed   By: Lahoma Crocker M.D.   On: 10/08/2013 13:14   Dg Abd 1 View  10/06/2013   CLINICAL DATA:  Preoperative lithotripsy ; right-sided calculus  EXAM: ABDOMEN - 1 VIEW  COMPARISON:  CT abdomen and pelvis October 03, 2013  FINDINGS: There is a calcification in the region of the right renal pelvis measuring 1.4 x 1.1 cm. There are vascular calcifications pelvis. There is postoperative change in the right abdomen. Bowel gas pattern is within normal limits.  IMPRESSION: 1.4 x 1.1 cm calculus in the region of the right renal pelvis. Bowel gas pattern unremarkable.   Electronically Signed   By: Lowella Grip M.D.   On: 10/06/2013 10:07   Ct Head Wo Contrast  10/08/2013   CLINICAL DATA:  Weakness  EXAM: CT HEAD WITHOUT CONTRAST  TECHNIQUE: Contiguous axial images were obtained from the base of the skull through the vertex without intravenous contrast.  COMPARISON:  MRI 10/25/2012  FINDINGS: Chronic left MCA infarct involving the left insula and basal ganglia, unchanged from the prior study.  Negative for acute infarct. Negative for hemorrhage or mass. Mild atrophy and chronic microvascular ischemic changes in the white matter.  IMPRESSION: Chronic left MCA infarct.  No acute abnormality.   Electronically Signed   By: Franchot Gallo M.D.   On: 10/08/2013 12:22   Ct Abdomen Pelvis W Contrast  10/03/2013   CLINICAL DATA:  Abdominal pain, no bowel movement for 4 days  EXAM: CT ABDOMEN AND PELVIS WITH CONTRAST  TECHNIQUE: Multidetector CT imaging of the abdomen and pelvis was performed using the standard protocol following bolus administration of intravenous contrast.  CONTRAST:  193mL OMNIPAQUE IOHEXOL 300 MG/ML  SOLN  COMPARISON:  Abdominal radiograph 09/25/2013; prior CT abdomen/ pelvis 09/07/2013  FINDINGS: Lower Chest:  Dependent atelectasis in the lower lobes. Visualized heart is within normal limits for size. No pericardial effusion. Atherosclerotic calcifications noted in the coronary arteries. Mild calcification of the aortic valve annulus.  Unremarkable distal thoracic esophagus.  Abdomen: Unremarkable CT appearance of the stomach, duodenum, spleen, pancreas and left adrenal gland. Stable 1.5 cm low-attenuation nodule arising from the lateral limb of the right adrenal gland dating back to June of 2013. Stability is suggestive of a benign process such as adenoma. Normal hepatic contour and morphology. There is a single 8 mm enhancing nodule in the dome of the right liver (image 9 series 2) which is not identified on prior imaging although this may be secondary to differences in contrast timing.  Cholelithiasis without secondary signs to suggest acute cholecystitis. No intra or extrahepatic biliary ductal dilatation.  Stable 11 mm stone in the right renal pelvis. This is nonobstructing. A smaller 3 mm stone is noted in the right lower pole. No calculi in the left kidney. Numerous bilateral renal cortical cysts are unchanged in size, number and configuration.  There are a few scattered colonic diverticula. No evidence of active inflammation to suggest acute diverticulitis. Surgical changes of prior appendectomy and partial see connecting the. The ileal colonic anastomosis is widely patent. The descending and sigmoid colon are decompressed. There is only a moderate stool burden in the ascending and transverse colon. No free fluid or suspicious adenopathy.  Pelvis: Prostatomegaly. Prostate gland measures 5.3 x 5.0 cm and indents the bladder base. The bladder is distended with urine, however there is diffuse thickening of the bladder wall as well as a suggestion of trabeculation and increased enhancement. Findings are similar to the prior CT scan from 09/07/2013.  Bones/Soft Tissues: No acute fracture or aggressive appearing lytic or  blastic osseous lesion. Multilevel degenerative disc disease and right worse than left lower lumbar facet arthropathy.  Vascular: Extensive atherosclerotic vascular disease. No aneurysmal dilatation. There is a moderate to high-grade stenosis of at the origin of the a right main renal artery. Likely mild-to-moderate stenosis at the origin of the left renal artery. Irregular atherosclerotic plaque noted in the infrarenal abdominal aorta. There is evidence of mild plaque ulceration as well. Irregular noncalcified fibro fatty plaque noted in the SMA approximately 3 cm from the origin results in at least moderate narrowing.  IMPRESSION: 1. No evidence of obstruction, or significant constipation/impaction. The overall stool burden is relatively low and predominantly in the ascending and transverse colon. The descending and sigmoid colons are decompressed. 2. Extensive atherosclerotic vascular disease including probable bilateral (right greater than left) renal artery stenoses, SMA stenosis, ulcerated plaque in the infrarenal abdominal aorta and coronary artery disease. 3. Irregular bladder wall thickening and enhancement similar to slightly improved compared to 09/07/2013. This may reflect recurrent or chronic cystitis. Additionally, an element of bladder outlet obstruction is suggested by prostatomegaly and likely trabeculation of the bladder wall. 4. An 8 mm arterially enhancing lesion is identified in the hepatic dome. This was not clearly seen on prior studies. This could simply represent a flash fill capillary hemangioma, or transient hepatic attenuation difference which was not seen on prior imaging dated differences in contrast timing. However, given the patient's history of colon cancer and early metastatic lesion is not excluded. Recommend further evaluation with MRI of the abdomen with and without contrast in 3 months. 5. Nonobstructing right renal calculi. 6. Additional ancillary findings as above.    Electronically Signed   By: Jacqulynn Cadet M.D.   On: 10/03/2013 08:12   US Renal  10/08/2013   CLINICAL DATA:  Right-sided lithotripsy 10/06/13. Acute on chronic renal failure.  EXAM: RENAL/URINARY TRACT ULTRASOUND COMPLETE  COMPARISON:  DG ABD 2  VIEWS dated 10/08/2013; CT ABD/PELVIS W CM dated 10/03/2013; US ABDOMEN COMPLETE dated 09/08/2013  FINDINGS: Right Kidney: 12.4 cm. No hydronephrosis. Normal renal cortical thickness and echogenicity. An area of hyper echogenicity which measures 8.8 cm along the lateral aspect of the interpolar right kidney is without correlate on the recent CT.  A minimally complex cyst measures 2.9 cm in the upper pole right kidney.  Left Kidney: 12.7 cm. No hydronephrosis. Normal renal cortical thickness and echogenicity. Upper pole complex cyst measures 2.8 cm. An interpolar lateral cyst or complex cyst measures 1.4 cm.  Bladder: Wall thickening. This is likely secondary to prostatomegaly. Debris within the dependent bladder.  IMPRESSION: 1. No hydronephrosis or other explanation for renal insufficiency. 2. Peripheral right interpolar renal area of hyperechogenicity. No correlate on the recent CT. This could be artifact or represent an area of infection or perirenal hematoma. Depending on clinical symptomatology, followup with CT should be considered. 3. Bilateral renal lesions, most consistent with cysts of varying complexity. 4. Prostatomegaly with likely secondary bladder outlet obstruction. Debris within the dependent bladder for which superimposed infection cannot be excluded.   Electronically Signed   By: Abigail Miyamoto M.D.   On: 10/08/2013 19:46   Dg Abd 2 Views  10/11/2013   CLINICAL DATA:  Abdominal distention and vomiting.  EXAM: ABDOMEN - 2 VIEW  COMPARISON:  None.  FINDINGS: Gaseous distention of the stomach is noted. Scattered air-fluid levels are seen within small bowel and colon, without significant small bowel or colonic dilatation. This is a nonspecific,  nonobstructive bowel gas pattern. No evidence of free air.  IMPRESSION: Nonspecific, nonobstructive bowel gas pattern.   Electronically Signed   By: Earle Gell M.D.   On: 10/11/2013 16:40   Dg Abd 2 Views  10/08/2013   CLINICAL DATA:  Abdominal pain and fever.  EXAM: ABDOMEN - 2 VIEW  COMPARISON:  10/06/2013  FINDINGS: There is no free air or free fluid in the abdomen. The bowel gas pattern is normal. The right renal stone seen on the prior study and is now fragmented. There appear to be multiple stone fragments in the renal pelvis as well as a small fragment in the lower pole the right kidney. No discrete fragments along the course of the right ureter.  Vascular calcifications are present in the pelvis.  IMPRESSION: Multiple fragments of the right renal stone are seen in the region of the right renal pelvis as described. Otherwise benign appearing abdomen.   Electronically Signed   By: Rozetta Nunnery M.D.   On: 10/08/2013 18:12     Microbiology: Recent Results (from the past 240 hour(s))  URINE CULTURE     Status: None   Collection Time    10/08/13 12:48 PM      Result Value Range Status   Specimen Description URINE, CATHETERIZED   Final   Special Requests NONE   Final   Culture  Setup Time     Final   Value: 10/08/2013 17:45     Performed at [facility]     Final   Value: NO GROWTH     Performed at [facility]   Culture     Final   Value: NO GROWTH     Performed at [facility]   Report Status 10/09/2013 FINAL   Final  CULTURE, BLOOD (ROUTINE X 2)     Status: None   Collection Time    10/08/13  3:40 PM  Result Value Range Status   Specimen Description BLOOD BLOOD RIGHT WRIST   Final   Special Requests BOTTLES DRAWN AEROBIC AND ANAEROBIC 5CC   Final   Culture  Setup Time     Final   Value: 10/09/2013 00:15     Performed at [facility]   Culture     Final   Value:        BLOOD CULTURE RECEIVED NO GROWTH TO DATE CULTURE WILL BE  HELD FOR 5 DAYS BEFORE ISSUING A FINAL NEGATIVE REPORT     Performed at [facility]   Report Status PENDING   Incomplete  CULTURE, BLOOD (ROUTINE X 2)     Status: None   Collection Time    10/08/13  3:46 PM      Result Value Range Status   Specimen Description BLOOD RIGHT HAND   Final   Special Requests BOTTLES DRAWN AEROBIC AND ANAEROBIC 5CC   Final   Culture  Setup Time     Final   Value: 10/09/2013 00:15     Performed at [facility]   Culture     Final   Value:        BLOOD CULTURE RECEIVED NO GROWTH TO DATE CULTURE WILL BE HELD FOR 5 DAYS BEFORE ISSUING A FINAL NEGATIVE REPORT     Performed at [facility]   Report Status PENDING   Incomplete  CLOSTRIDIUM DIFFICILE BY PCR     Status: None   Collection Time    10/10/13  1:28 PM      Result Value Range Status   C difficile by pcr NEGATIVE  NEGATIVE Final   Comment: Performed at [facility]  SURGICAL PCR SCREEN     Status: None   Collection Time    10/11/13  7:53 AM      Result Value Range Status   MRSA, PCR NEGATIVE  NEGATIVE Final   Staphylococcus aureus NEGATIVE  NEGATIVE Final   Comment:            The Xpert SA Assay (FDA     approved for NASAL specimens     in patients over 51 years of age),     is one component of     a comprehensive surveillance     program.  Test performance has     been validated by [employer] for patients greater     than or equal to 5 year old.     It is not intended     to diagnose infection nor to     guide or monitor treatment.     Labs: Basic Metabolic Panel:  Recent Labs Lab 10/08/13 1230 10/09/13 0533 10/10/13 0447 10/11/13 0415 10/12/13 0436  NA 138 138 137 139 140  K 4.2 3.8 3.8 3.9 4.1  CL 100 103 103 104 107  CO2 25 24 21 19 20   GLUCOSE 182* 167* 163* 143* 138*  BUN 39* 38* 35* 34* 35*  CREATININE 2.95* 2.89* 2.80* 2.64* 2.47*  CALCIUM 8.9 7.7* 7.8* 7.8* 7.9*   Liver Function Tests:  Recent Labs Lab 10/08/13 1230  10/09/13 0533 10/12/13 0436  AST 17 12 11   ALT 8 7 13   ALKPHOS 74 60 179*  BILITOT 0.4 0.6 0.5  PROT 6.5 5.2* 5.7*  ALBUMIN 2.7* 2.1* 1.9*    Recent Labs Lab 10/08/13 1230  LIPASE 14   No results found for this basename: AMMONIA,  in the  last 168 hours CBC:  Recent Labs Lab 10/08/13 1230 10/10/13 0447 10/11/13 0415 10/12/13 0436  WBC 11.7* 8.7 10.1 10.5  NEUTROABS 9.3*  --   --   --   HGB 11.5* 9.5* 10.5* 10.4*  HCT 33.4* 27.5* 30.9* 30.9*  MCV 82.5 82.3 82.4 83.3  PLT 137* 133* 181 212   Cardiac Enzymes:  Recent Labs Lab 10/08/13 1830 10/08/13 2330 10/09/13 0533  TROPONINI <0.30 <0.30 <0.30   BNP: No components found with this basename: POCBNP,  CBG:  Recent Labs Lab 10/12/13 1648 10/12/13 2250 10/13/13 1701 10/14/13 0747 10/14/13 1134  GLUCAP 141* 195* 159* 105* 193*    Time coordinating discharge:  Greater than 30 minutes  Signed:  Redmond Whittley, DO [facility] Pager: [phone] 10/14/2013, 2:47 PM

## 2013-10-12 NOTE — Progress Notes (Signed)
1 Day Post-Op Subjective: Patient reports No pain.  No nausea.  Objective: Vital signs in last 24 hours: Temp:  [98 F (36.7 C)-98.7 F (37.1 C)] 98.7 F (37.1 C) (01/25 0418) Pulse Rate:  [55-69] 63 (01/25 0418) Resp:  [18-22] 18 (01/25 0418) BP: (150-202)/(42-72) 178/63 mmHg (01/25 0418) SpO2:  [94 %-100 %] 100 % (01/25 0418) Weight:  [77.8 kg (171 lb 8.3 oz)] 77.8 kg (171 lb 8.3 oz) (01/25 0418)  Intake/Output from previous day: 01/24 0701 - 01/25 0700 In: 3557.5 [P.O.:460; I.V.:2997.5; IV Piggyback:100] Out: -  Intake/Output this shift:    Physical Exam:  General:More responsive this morning. Abd: Soft non tender.  No flank mass. Creat slowly coming down: 2.47  Lab Results:  Recent Labs  10/10/13 0447 10/11/13 0415 10/12/13 0436  HGB 9.5* 10.5* 10.4*  HCT 27.5* 30.9* 30.9*   BMET  Recent Labs  10/11/13 0415 10/12/13 0436  NA 139 140  K 3.9 4.1  CL 104 107  CO2 19 20  GLUCOSE 143* 138*  BUN 34* 35*  CREATININE 2.64* 2.47*  CALCIUM 7.8* 7.9*   No results found for this basename: LABPT, INR,  in the last 72 hours No results found for this basename: LABURIN,  in the last 72 hours Results for orders placed during the hospital encounter of 10/08/13  URINE CULTURE     Status: None   Collection Time    10/08/13 12:48 PM      Result Value Range Status   Specimen Description URINE, CATHETERIZED   Final   Special Requests NONE   Final   Culture  Setup Time     Final   Value: 10/08/2013 17:45     Performed at [place]     Final   Value: NO GROWTH     Performed at [facility]   Culture     Final   Value: NO GROWTH     Performed at [facility]   Report Status 10/09/2013 FINAL   Final  CULTURE, BLOOD (ROUTINE X 2)     Status: None   Collection Time    10/08/13  3:40 PM      Result Value Range Status   Specimen Description BLOOD BLOOD RIGHT WRIST   Final   Special Requests BOTTLES DRAWN AEROBIC AND ANAEROBIC  5CC   Final   Culture  Setup Time     Final   Value: 10/09/2013 00:15     Performed at [facility]   Culture     Final   Value:        BLOOD CULTURE RECEIVED NO GROWTH TO DATE CULTURE WILL BE HELD FOR 5 DAYS BEFORE ISSUING A FINAL NEGATIVE REPORT     Performed at [facility]   Report Status PENDING   Incomplete  CULTURE, BLOOD (ROUTINE X 2)     Status: None   Collection Time    10/08/13  3:46 PM      Result Value Range Status   Specimen Description BLOOD RIGHT HAND   Final   Special Requests BOTTLES DRAWN AEROBIC AND ANAEROBIC 5CC   Final   Culture  Setup Time     Final   Value: 10/09/2013 00:15     Performed at [facility]   Culture     Final   Value:        BLOOD CULTURE RECEIVED NO GROWTH TO DATE CULTURE WILL BE HELD FOR 5  DAYS BEFORE ISSUING A FINAL NEGATIVE REPORT     Performed at [facility]   Report Status PENDING   Incomplete  CLOSTRIDIUM DIFFICILE BY PCR     Status: None   Collection Time    10/10/13  1:28 PM      Result Value Range Status   C difficile by pcr NEGATIVE  NEGATIVE Final   Comment: Performed at [facility]  SURGICAL PCR SCREEN     Status: None   Collection Time    2013/11/04  7:53 AM      Result Value Range Status   MRSA, PCR NEGATIVE  NEGATIVE Final   Staphylococcus aureus NEGATIVE  NEGATIVE Final   Comment:            The Xpert SA Assay (FDA     approved for NASAL specimens     in patients over 42 years of age),     is one component of     a comprehensive surveillance     program.  Test performance has     been validated by [employer] for patients greater     than or equal to 77 year old.     It is not intended     to diagnose infection nor to     guide or monitor treatment.    Studies/Results: Dg Abd 2 Views  2013/11/04   CLINICAL DATA:  Abdominal distention and vomiting.  EXAM: ABDOMEN - 2 VIEW  COMPARISON:  None.  FINDINGS: Gaseous distention of the stomach is noted. Scattered air-fluid levels  are seen within small bowel and colon, without significant small bowel or colonic dilatation. This is a nonspecific, nonobstructive bowel gas pattern. No evidence of free air.  IMPRESSION: Nonspecific, nonobstructive bowel gas pattern.   Electronically Signed   By: Earle Gell M.D.   On: November 04, 2013 16:40    Assessment/Plan:  Right UPJ calculus.  S/P ESL right renal calculus.  Right perinephric hematoma. Diabetes.  Hypertension  PLAN: Monitor creatinine.  Repeat renal ultrasound in AM.   LOS: 4 days   Hanley Ben 10/12/2013, 8:38 AM

## 2013-10-13 ENCOUNTER — Encounter (HOSPITAL_COMMUNITY): Payer: Self-pay | Admitting: Urology

## 2013-10-13 ENCOUNTER — Inpatient Hospital Stay (HOSPITAL_COMMUNITY): Payer: Medicare Other

## 2013-10-13 DIAGNOSIS — R4701 Aphasia: Secondary | ICD-10-CM

## 2013-10-13 LAB — GLUCOSE, CAPILLARY: GLUCOSE-CAPILLARY: 159 mg/dL — AB (ref 70–99)

## 2013-10-13 MED ORDER — FUROSEMIDE 10 MG/ML IJ SOLN
INTRAMUSCULAR | Status: AC
  Filled 2013-10-13: qty 4

## 2013-10-13 MED ORDER — HYDRALAZINE HCL 20 MG/ML IJ SOLN: 10.0000 mg | Freq: Four times a day (QID) | INTRAMUSCULAR | Status: DC | PRN

## 2013-10-13 MED ORDER — GI COCKTAIL ~~LOC~~
30.0000 mL | Freq: Once | ORAL | Status: AC
  Administered 2013-10-13: 30 mL via ORAL
  Filled 2013-10-13: qty 30

## 2013-10-13 MED ORDER — MORPHINE SULFATE 2 MG/ML IJ SOLN: 2.0000 mg | INTRAMUSCULAR | Status: DC | PRN

## 2013-10-13 MED ORDER — FUROSEMIDE 10 MG/ML IJ SOLN
20.0000 mg | Freq: Once | INTRAMUSCULAR | Status: AC
  Administered 2013-10-13: 20 mg via INTRAVENOUS

## 2013-10-13 NOTE — Progress Notes (Addendum)
Physical Therapy Treatment Patient Details Name: [name] MRN: [id number] DOB: 01/23/1941 Today's Date: 10/13/2013 Time: 1214-1227 PT Time Calculation (min): 13 min  PT Assessment / Plan / Recommendation  History of Present Illness 73 yo male admitted with acute on chronic renal failure, fall, weakness. Hx of CVA, COPD, HTN, DM, aphasia, dysphagia.    PT Comments   Pt initially declined to participate, however once therapist explained reason for being there, he agreed to sit EOB and to stand briefly to get soiled linens out from underneath him. Assisted pt back to bed once linen removed. Spoke with RN before session who stated tx session was needed for recommendation for SNF-family unable to care for pt at home. Will follow up on next visit to see if pt/family wishes for PT to continue to work with pt.   Follow Up Recommendations  SNF     Does the patient have the potential to tolerate intense rehabilitation     Barriers to Discharge        Equipment Recommendations  None recommended by PT    Recommendations for Other Services    Frequency Min 2X/week   Progress towards PT Goals Progress towards PT goals: Not progressing toward goals - comment  Plan Discharge plan needs to be updated    Precautions / Restrictions Precautions Precautions: Fall Restrictions Weight Bearing Restrictions: No   Pertinent Vitals/Pain No indication of pain during session. At end of session, pt began mumbling but did not state if he was having pain.     Mobility  Bed Mobility Overal bed mobility: Needs Assistance Bed Mobility: Supine to Sit;Sit to Supine Supine to sit: Min assist Sit to supine: Min assist General bed mobility comments: Assist for trunk and LEs. Increased time. Multimodal cues for safety, technique, hand placement Transfers Overall transfer level: Needs assistance Equipment used: Rolling walker (2 wheeled) Transfers: Sit to/from Stand Sit to Stand: Min assist General  transfer comment: assist to rise, stabilize, control descent.  Ambulation/Gait Ambulation/Gait assistance: Min assist Assistive device: Rolling walker (2 wheeled) General Gait Details: sidesteps to [facility] with walker, 3-4. Assist to stabilize throughout.     Exercises     PT Diagnosis:    PT Problem List:   PT Treatment Interventions:     PT Goals (current goals can now be found in the care plan section)    Visit Information  Last PT Received On: 10/13/13 Assistance Needed: +1 History of Present Illness: 73 yo male admitted with acute on chronic renal failure, fall, weakness. Hx of CVA, COPD, HTN, DM, aphasia, dysphagia.     Subjective Data      Cognition  Cognition Arousal/Alertness: Lethargic Behavior During Therapy: Flat affect Overall Cognitive Status: Difficult to assess    Balance  Balance Overall balance assessment: History of Falls;Needs assistance Sitting-balance support: Feet supported;Bilateral upper extremity supported Sitting balance-Leahy Scale: Fair Standing balance support: Bilateral upper extremity supported;During functional activity Standing balance-Leahy Scale: Poor  End of Session PT - End of Session Equipment Utilized During Treatment: Gait belt Activity Tolerance: Patient limited by lethargy Patient left: with call bell/phone within reach;with bed alarm set   GP     Weston Anna, MPT Pager: [phone]

## 2013-10-13 NOTE — Progress Notes (Signed)
Chaplain provided support in response to nursing referral.  Emotional support and empathic presence at bedside.  Pt verbal, but difficult to understand.  Stated that he wished to go home and spoke with chaplain about feeling lonely.  Has not been able to be visited by spouse.   Chaplain will follow up in AM in attempt to connect with pt's daughter.  Pt open to continued presence / support.  [place], [place]

## 2013-10-13 NOTE — Discharge Instructions (Signed)
Ureteral Colic Ureteral colic is spasm-like pain from the kidney or the ureter. This is often caused by a kidney stone. The pain is caused by the stone trying to get through the tubes that pass your pee. HOME CARE   Drink enough fluids to keep your pee (urine) clear or pale yellow.  Strain all your pee. A strainer will be provided. Keep anything caught in the strainer and bring it to your doctor. The stone causing the pain may be very small.  Only take medicine as told by your doctor.  Follow up with your doctor as told. GET HELP RIGHT AWAY IF:   Pain is not controlled with medicine.  Pain continues or gets worse.  The pain changes and there is chest or belly (abdominal) pain.  You pass out (faint).  You cannot pee.  You keep throwing up (vomiting).  You have a temperature by mouth above 102 F (38.9 C), not controlled by medicine. MAKE SURE YOU:   Understand these instructions.  Will watch this condition.  Will get help right away if you are not doing well or get worse. Document Released: 02/21/2008 Document Revised: 11/27/2011 Document Reviewed: 02/21/2008 Exit[employer] Patient Information 2014 [employer].  

## 2013-10-13 NOTE — Progress Notes (Signed)
Nutrition Brief Note   Pt screened for Low Braden Score Chart reviewed. -Goals of Care  -Long discussion with the patient's daughter (POA) and sister  -they feel the patient is suffering with limited quality of life  -They want to transition to a focus of comfort care  -They did not want any additional testing, labs, and they desire transfer to SNF with hospice Vs residential hospice  Pt now transitioning to comfort care.  Declined supplementation No further nutrition interventions warranted at this time.  Please re-consult as needed.   Atlee Abide MS RD LDN Clinical Dietitian [phone]

## 2013-10-13 NOTE — Progress Notes (Signed)
CSW received consult from Dr. Carles Collet during rounds that patient is for Residential Hospice placmement. CSW made referral to [facility] (no availability), [place], [place] - awaiting calls back re: bed availability.   RNCM spoke with patient's daughter, [name] & son-in-law via phone re: home with hospice but per family, there is no one able to take care of him during the day.   Raynaldo Opitz, LCSW [facility] Clinical Social Worker cell #: [phone]'

## 2013-10-13 NOTE — Progress Notes (Signed)
[facility] PROGRESS NOTE  [name] [id number] DOB: [birth year] DOA: 10/08/2013 PCP: Stephens Shire, MD  Assessment/Plan: Acute on chronic renal failure (CKD stage III)  -multifactorial from renal parenchymal compression from hematoma and volume depletion  - history of decreased po intake  -Renal ultrasound--negative for hydronephrosis, right renal hyperechogenicity region-->CT abdomen and pelvis  -Baseline creatinine 1.0-1.3  -Renal function is slowly improving  -No longer an active issue as the patient is being transitioned to a focus corrected on comfort care  Right perinephric hematoma  -10/09/2013 CT abdomen and pelvis showed right renal subcapsular hematoma with right renal parenchymal compression  -Appreciate Dr. Junious Silk consult  -d/c ASA/Plavix, enoxaparin  -may have been due to mechanical fall  -monitor H/H--stable  -Left subclavian and left circumflex stent placed 05/14/2012  --No longer an active issue as the patient is being transitioned to a focus corrected on comfort care  Right renal pelvic stone  -no hydronephrosis  -appreciate Dr. Atilano Ina attempt JJ stent.  -?contributing to renal failure--no dye noted from right ureteral orifice  -No longer an active issue as the patient is being transitioned to a focus corrected on comfort care  Abdominal distension  -2-view abdomen xray--nonobstructive bowel gas pattern  Generalized weakness  -Multifactorial including possible UTI as well as acute on chronic renal failure and anemia  -PT evaluation-->HHPT  Fever  -resolved -in part due to R-perinephric hematoma  -Blood cultures x2 sets--neg to date  -Urine culture--no growth  -Empiric Zosyn given the patient's recent hospitalization and lithotripsy which has since been discontinued as pt was transitioned to focus on comfort care -Influenza pcr--negative  Hypertension  -Discontinue Diovan as blood pressure is soft and with renal failure  -In  light of the patient's renal artery stenosis, will not likely restart Diovan  -BP creeping up  -start po anti-HTN if able to take po  -hydralazine prn Diabetes mellitus type 2  -NovoLog sliding scale--change to moderate scale  -Hemoglobin A1c--7.9  -Discontinue metformin  --No longer an active issue as the patient is being transitioned to a focus corrected on comfort care--> discontinued all CBG checks  CAD/history of stroke  -held Plavix and aspirin due to the patient's perinephric hematoma  Seizure disorder  -Continue Topamax and Keppra  -Keppra has been decreased to once daily due to the patient's renal failure  Atypical chest pain  -unclear if pt truly has cp  -cycle troponins--neg x 3  RUE pain and edema  -venous duplex--negative  Goals of Care  -Long discussion with the patient's daughter (POA) and sister  -they feel the patient is suffering with limited quality of life  -They want to transition to a focus of comfort care  -They did not want any additional testing, labs, and they desire transfer to SNF with hospice vs residential hospice  -Discontinue telemetry and further lab work  Family Communication: daughter updated at bedside Disposition Plan: Residential hospice vs SNF with hospice care          Procedures/Studies: Ct Abdomen Pelvis Wo Contrast  10/09/2013   CLINICAL DATA:  Abdominal pain and fever. Right perirenal hematoma. Recent lithotripsy.  EXAM: CT ABDOMEN AND PELVIS WITHOUT CONTRAST  TECHNIQUE: Multidetector CT imaging of the abdomen and pelvis was performed following the standard protocol without intravenous contrast.  COMPARISON:  CT scan dated 10/03/2013  FINDINGS: There are new tiny bilateral effusions with new slight right base atelectasis.  There is a prominent right renal subcapsular hematoma which completely surrounds the right kidney and compresses it.  There is a 23 x 11 x 6 mm stone fragment in the distal right renal pelvis. There are multiple  small stone fragments in the right renal collecting system proximal to the stone. There is also blood in the exophytic cyst in the right upper pole of the kidney.  There is a small amount of blood extending along Gerota's fascia and along the right psoas muscle into the right side of the pelvis.  The liver and spleen and pancreas are normal. There are multiple small stones in the gallbladder. No biliary ductal dilatation. There is a 2.2 cm nodule in the in the right adrenal gland consistent with a benign adenoma.  No acute abnormality of the bowel. There are surgical staples at in the ascending colon.  There is slight irregular thickening of the bladder wall as previously noted. No acute osseous abnormality.  IMPRESSION: 1. Prominent right subcapsular hematoma of the right kidney compressing the renal parenchyma. 2. Large stone fragment in the inferior aspect of the right renal pelvis without obstruction. Multiple smaller stone fragments in the proximal right renal collecting system. Critical Value/emergent results were called by telephone at the time of interpretation on 10/09/2013 at 10:34 PM to Junior, RN, who verbally acknowledged these results.   Electronically Signed   By: Rozetta Nunnery M.D.   On: 10/09/2013 22:35   Dg Chest 2 View  10/08/2013   CLINICAL DATA:  Fall, weakness  EXAM: CHEST  2 VIEW  COMPARISON:  09/07/2013  FINDINGS: Cardiomediastinal silhouette is stable. No acute infiltrate or pleural effusion. No pulmonary edema. Status post CABG. Bony thorax is stable.  IMPRESSION: No active disease.  Status post CABG again noted.   Electronically Signed   By: Lahoma Crocker M.D.   On: 10/08/2013 13:14   Dg Abd 1 View  10/06/2013   CLINICAL DATA:  Preoperative lithotripsy ; right-sided calculus  EXAM: ABDOMEN - 1 VIEW  COMPARISON:  CT abdomen and pelvis October 03, 2013  FINDINGS: There is a calcification in the region of the right renal pelvis measuring 1.4 x 1.1 cm. There are vascular calcifications  pelvis. There is postoperative change in the right abdomen. Bowel gas pattern is within normal limits.  IMPRESSION: 1.4 x 1.1 cm calculus in the region of the right renal pelvis. Bowel gas pattern unremarkable.   Electronically Signed   By: Lowella Grip M.D.   On: 10/06/2013 10:07   Ct Head Wo Contrast  10/08/2013   CLINICAL DATA:  Weakness  EXAM: CT HEAD WITHOUT CONTRAST  TECHNIQUE: Contiguous axial images were obtained from the base of the skull through the vertex without intravenous contrast.  COMPARISON:  MRI 10/25/2012  FINDINGS: Chronic left MCA infarct involving the left insula and basal ganglia, unchanged from the prior study.  Negative for acute infarct. Negative for hemorrhage or mass. Mild atrophy and chronic microvascular ischemic changes in the white matter.  IMPRESSION: Chronic left MCA infarct.  No acute abnormality.   Electronically Signed   By: Franchot Gallo M.D.   On: 10/08/2013 12:22   Ct Abdomen Pelvis W Contrast  10/03/2013   CLINICAL DATA:  Abdominal pain, no bowel movement for 4 days  EXAM: CT ABDOMEN AND PELVIS WITH CONTRAST  TECHNIQUE: Multidetector CT imaging of the abdomen and pelvis was performed using the standard protocol following bolus administration of intravenous contrast.  CONTRAST:  127mL OMNIPAQUE IOHEXOL 300 MG/ML  SOLN  COMPARISON:  Abdominal radiograph 09/25/2013; prior CT abdomen/ pelvis 09/07/2013  FINDINGS: Lower Chest: Dependent atelectasis  in the lower lobes. Visualized heart is within normal limits for size. No pericardial effusion. Atherosclerotic calcifications noted in the coronary arteries. Mild calcification of the aortic valve annulus. Unremarkable distal thoracic esophagus.  Abdomen: Unremarkable CT appearance of the stomach, duodenum, spleen, pancreas and left adrenal gland. Stable 1.5 cm low-attenuation nodule arising from the lateral limb of the right adrenal gland dating back to June of 2013. Stability is suggestive of a benign process such as  adenoma. Normal hepatic contour and morphology. There is a single 8 mm enhancing nodule in the dome of the right liver (image 9 series 2) which is not identified on prior imaging although this may be secondary to differences in contrast timing.  Cholelithiasis without secondary signs to suggest acute cholecystitis. No intra or extrahepatic biliary ductal dilatation.  Stable 11 mm stone in the right renal pelvis. This is nonobstructing. A smaller 3 mm stone is noted in the right lower pole. No calculi in the left kidney. Numerous bilateral renal cortical cysts are unchanged in size, number and configuration.  There are a few scattered colonic diverticula. No evidence of active inflammation to suggest acute diverticulitis. Surgical changes of prior appendectomy and partial see connecting the. The ileal colonic anastomosis is widely patent. The descending and sigmoid colon are decompressed. There is only a moderate stool burden in the ascending and transverse colon. No free fluid or suspicious adenopathy.  Pelvis: Prostatomegaly. Prostate gland measures 5.3 x 5.0 cm and indents the bladder base. The bladder is distended with urine, however there is diffuse thickening of the bladder wall as well as a suggestion of trabeculation and increased enhancement. Findings are similar to the prior CT scan from 09/07/2013.  Bones/Soft Tissues: No acute fracture or aggressive appearing lytic or blastic osseous lesion. Multilevel degenerative disc disease and right worse than left lower lumbar facet arthropathy.  Vascular: Extensive atherosclerotic vascular disease. No aneurysmal dilatation. There is a moderate to high-grade stenosis of at the origin of the a right main renal artery. Likely mild-to-moderate stenosis at the origin of the left renal artery. Irregular atherosclerotic plaque noted in the infrarenal abdominal aorta. There is evidence of mild plaque ulceration as well. Irregular noncalcified fibro fatty plaque noted in  the SMA approximately 3 cm from the origin results in at least moderate narrowing.  IMPRESSION: 1. No evidence of obstruction, or significant constipation/impaction. The overall stool burden is relatively low and predominantly in the ascending and transverse colon. The descending and sigmoid colons are decompressed. 2. Extensive atherosclerotic vascular disease including probable bilateral (right greater than left) renal artery stenoses, SMA stenosis, ulcerated plaque in the infrarenal abdominal aorta and coronary artery disease. 3. Irregular bladder wall thickening and enhancement similar to slightly improved compared to 09/07/2013. This may reflect recurrent or chronic cystitis. Additionally, an element of bladder outlet obstruction is suggested by prostatomegaly and likely trabeculation of the bladder wall. 4. An 8 mm arterially enhancing lesion is identified in the hepatic dome. This was not clearly seen on prior studies. This could simply represent a flash fill capillary hemangioma, or transient hepatic attenuation difference which was not seen on prior imaging dated differences in contrast timing. However, given the patient's history of colon cancer and early metastatic lesion is not excluded. Recommend further evaluation with MRI of the abdomen with and without contrast in 3 months. 5. Nonobstructing right renal calculi. 6. Additional ancillary findings as above.   Electronically Signed   By: Jacqulynn Cadet M.D.   On: 10/03/2013 08:12  US Renal  10/08/2013   CLINICAL DATA:  Right-sided lithotripsy 10/06/13. Acute on chronic renal failure.  EXAM: RENAL/URINARY TRACT ULTRASOUND COMPLETE  COMPARISON:  DG ABD 2 VIEWS dated 10/08/2013; CT ABD/PELVIS W CM dated 10/03/2013; US ABDOMEN COMPLETE dated 09/08/2013  FINDINGS: Right Kidney: 12.4 cm. No hydronephrosis. Normal renal cortical thickness and echogenicity. An area of hyper echogenicity which measures 8.8 cm along the lateral aspect of the interpolar right  kidney is without correlate on the recent CT.  A minimally complex cyst measures 2.9 cm in the upper pole right kidney.  Left Kidney: 12.7 cm. No hydronephrosis. Normal renal cortical thickness and echogenicity. Upper pole complex cyst measures 2.8 cm. An interpolar lateral cyst or complex cyst measures 1.4 cm.  Bladder: Wall thickening. This is likely secondary to prostatomegaly. Debris within the dependent bladder.  IMPRESSION: 1. No hydronephrosis or other explanation for renal insufficiency. 2. Peripheral right interpolar renal area of hyperechogenicity. No correlate on the recent CT. This could be artifact or represent an area of infection or perirenal hematoma. Depending on clinical symptomatology, followup with CT should be considered. 3. Bilateral renal lesions, most consistent with cysts of varying complexity. 4. Prostatomegaly with likely secondary bladder outlet obstruction. Debris within the dependent bladder for which superimposed infection cannot be excluded.   Electronically Signed   By: Abigail Miyamoto M.D.   On: 10/08/2013 19:46   Dg Abd 2 Views  10/11/2013   CLINICAL DATA:  Abdominal distention and vomiting.  EXAM: ABDOMEN - 2 VIEW  COMPARISON:  None.  FINDINGS: Gaseous distention of the stomach is noted. Scattered air-fluid levels are seen within small bowel and colon, without significant small bowel or colonic dilatation. This is a nonspecific, nonobstructive bowel gas pattern. No evidence of free air.  IMPRESSION: Nonspecific, nonobstructive bowel gas pattern.   Electronically Signed   By: Earle Gell M.D.   On: 10/11/2013 16:40   Dg Abd 2 Views  10/08/2013   CLINICAL DATA:  Abdominal pain and fever.  EXAM: ABDOMEN - 2 VIEW  COMPARISON:  10/06/2013  FINDINGS: There is no free air or free fluid in the abdomen. The bowel gas pattern is normal. The right renal stone seen on the prior study and is now fragmented. There appear to be multiple stone fragments in the renal pelvis as well as a small  fragment in the lower pole the right kidney. No discrete fragments along the course of the right ureter.  Vascular calcifications are present in the pelvis.  IMPRESSION: Multiple fragments of the right renal stone are seen in the region of the right renal pelvis as described. Otherwise benign appearing abdomen.   Electronically Signed   By: Rozetta Nunnery M.D.   On: 10/08/2013 18:12         Subjective: Patient complains of some shortness of breath. Denies any headache, chest pain, vomiting, abdominal pain, dysuria.  Objective: Filed Vitals:   10/12/13 2130 10/13/13 0645 10/13/13 0700 10/13/13 1422  BP: 175/62 181/62  173/74  Pulse: 71 81  89  Temp: 99.2 F (37.3 C) 98.2 F (36.8 C)  98.4 F (36.9 C)  TempSrc: Oral Oral  Oral  Resp: 20 21  18   Height:      Weight:      SpO2: 100% 99% 96% 100%    Intake/Output Summary (Last 24 hours) at 10/13/13 1913 Last data filed at 10/13/13 1300  Gross per 24 hour  Intake    960 ml  Output  0 ml  Net    960 ml   Weight change:  Exam:   General:  Pt is alert, follows commands appropriately, not in acute distress  HEENT: No icterus, No thrush,  [place]/AT  Cardiovascular: RRR, S1/S2, no rubs, no gallops  Respiratory: Bibasilar crackles. No wheezing. Good air movement.rding  Extremities: No edema, No lymphangitis, No petechiae, No rashes, no synovitis  Data Reviewed: Basic Metabolic Panel:  Recent Labs Lab 10/08/13 1230 10/09/13 0533 10/10/13 0447 10/11/13 0415 10/12/13 0436  NA 138 138 137 139 140  K 4.2 3.8 3.8 3.9 4.1  CL 100 103 103 104 107  CO2 25 24 21 19 20   GLUCOSE 182* 167* 163* 143* 138*  BUN 39* 38* 35* 34* 35*  CREATININE 2.95* 2.89* 2.80* 2.64* 2.47*  CALCIUM 8.9 7.7* 7.8* 7.8* 7.9*   Liver Function Tests:  Recent Labs Lab 10/08/13 1230 10/09/13 0533 10/12/13 0436  AST 17 12 11   ALT 8 7 13   ALKPHOS 74 60 179*  BILITOT 0.4 0.6 0.5  PROT 6.5 5.2* 5.7*  ALBUMIN 2.7* 2.1* 1.9*    Recent Labs Lab  10/08/13 1230  LIPASE 14   No results found for this basename: AMMONIA,  in the last 168 hours CBC:  Recent Labs Lab 10/08/13 1230 10/10/13 0447 10/11/13 0415 10/12/13 0436  WBC 11.7* 8.7 10.1 10.5  NEUTROABS 9.3*  --   --   --   HGB 11.5* 9.5* 10.5* 10.4*  HCT 33.4* 27.5* 30.9* 30.9*  MCV 82.5 82.3 82.4 83.3  PLT 137* 133* 181 212   Cardiac Enzymes:  Recent Labs Lab 10/08/13 1830 10/08/13 2330 10/09/13 0533  TROPONINI <0.30 <0.30 <0.30   BNP: No components found with this basename: POCBNP,  CBG:  Recent Labs Lab 10/12/13 0759 10/12/13 1213 10/12/13 1648 10/12/13 2250 10/13/13 1701  GLUCAP 106* 117* 141* 195* 159*    Recent Results (from the past 240 hour(s))  URINE CULTURE     Status: None   Collection Time    10/08/13 12:48 PM      Result Value Range Status   Specimen Description URINE, CATHETERIZED   Final   Special Requests NONE   Final   Culture  Setup Time     Final   Value: 10/08/2013 17:45     Performed at [place]     Final   Value: NO GROWTH     Performed at [facility]   Culture     Final   Value: NO GROWTH     Performed at [facility]   Report Status 10/09/2013 FINAL   Final  CULTURE, BLOOD (ROUTINE X 2)     Status: None   Collection Time    10/08/13  3:40 PM      Result Value Range Status   Specimen Description BLOOD BLOOD RIGHT WRIST   Final   Special Requests BOTTLES DRAWN AEROBIC AND ANAEROBIC 5CC   Final   Culture  Setup Time     Final   Value: 10/09/2013 00:15     Performed at [facility]   Culture     Final   Value:        BLOOD CULTURE RECEIVED NO GROWTH TO DATE CULTURE WILL BE HELD FOR 5 DAYS BEFORE ISSUING A FINAL NEGATIVE REPORT     Performed at [facility]   Report Status PENDING   Incomplete  CULTURE, BLOOD (ROUTINE X 2)  Status: None   Collection Time    10/08/13  3:46 PM      Result Value Range Status   Specimen Description BLOOD RIGHT HAND   Final    Special Requests BOTTLES DRAWN AEROBIC AND ANAEROBIC 5CC   Final   Culture  Setup Time     Final   Value: 10/09/2013 00:15     Performed at [facility]   Culture     Final   Value:        BLOOD CULTURE RECEIVED NO GROWTH TO DATE CULTURE WILL BE HELD FOR 5 DAYS BEFORE ISSUING A FINAL NEGATIVE REPORT     Performed at [facility]   Report Status PENDING   Incomplete  CLOSTRIDIUM DIFFICILE BY PCR     Status: None   Collection Time    10/10/13  1:28 PM      Result Value Range Status   C difficile by pcr NEGATIVE  NEGATIVE Final   Comment: Performed at [facility]  SURGICAL PCR SCREEN     Status: None   Collection Time    10/11/13  7:53 AM      Result Value Range Status   MRSA, PCR NEGATIVE  NEGATIVE Final   Staphylococcus aureus NEGATIVE  NEGATIVE Final   Comment:            The Xpert SA Assay (FDA     approved for NASAL specimens     in patients over 9 years of age),     is one component of     a comprehensive surveillance     program.  Test performance has     been validated by [employer] for patients greater     than or equal to 64 year old.     It is not intended     to diagnose infection nor to     guide or monitor treatment.     Scheduled Meds: . ciprofloxacin  400 mg Intravenous 60 min Pre-Op  . indigotindisulfonate  5 mL Intravenous Once  . levETIRAcetam  500 mg Oral Daily  . LORazepam  1 mg Oral QHS  . sodium chloride  3 mL Intravenous Q12H  . topiramate  25 mg Oral BID   Continuous Infusions:    Eren Ryser, DO  [facility] Pager [phone]  If 7PM-7AM, please contact night-coverage [email or web address] Password [email or web address] 10/13/2013, 7:13 PM   LOS: 5 days

## 2013-10-14 DIAGNOSIS — I6350 Cerebral infarction due to unspecified occlusion or stenosis of unspecified cerebral artery: Secondary | ICD-10-CM

## 2013-10-14 DIAGNOSIS — E860 Dehydration: Secondary | ICD-10-CM

## 2013-10-14 LAB — GLUCOSE, CAPILLARY
Glucose-Capillary: 105 mg/dL — ABNORMAL HIGH (ref 70–99)
Glucose-Capillary: 193 mg/dL — ABNORMAL HIGH (ref 70–99)

## 2013-10-14 MED ORDER — OXYCODONE-ACETAMINOPHEN 5-325 MG PO TABS: 1.0000 | ORAL_TABLET | ORAL | Status: AC | PRN

## 2013-10-14 MED ORDER — ONDANSETRON 4 MG PO TBDP: 4.0000 mg | ORAL_TABLET | Freq: Three times a day (TID) | ORAL | Status: AC | PRN

## 2013-10-14 MED ORDER — MORPHINE SULFATE (CONCENTRATE) 10 MG /0.5 ML PO SOLN: 4.0000 mg | ORAL | Status: AC | PRN

## 2013-10-14 MED ORDER — LORAZEPAM 1 MG PO TABS: 1.0000 mg | ORAL_TABLET | Freq: Every day | ORAL | Status: AC

## 2013-10-14 NOTE — Progress Notes (Signed)
Patient is set to discharge to [facility] today. Discharge packet in [place]. PTAR scheduled for 4:30p pickup (Service Request Id: [id number]). Daughter, [name] at bedside & aware.   Raynaldo Opitz, [facility] Clinical Social Worker cell #: [phone]

## 2013-10-14 NOTE — Progress Notes (Signed)
Patient ID: [name], male   DOB: [birth year], 73 y.o.   MRN: [id number]  Pt without complaint. He's sitting up in bed trying to drink some coffee and eat breakfast. A nurse tech is having to assist. His daughter is with him. I reviewed Dr. Sammie Bench op note and discussed patient with him. I reviewed the chart notes and decision to limit interventions.   PE: NAD Abd - soft, NT, no CVAT  Imp -  Peri-renal hematoma - stable - if possible keep off anticoagulation for at least 2 weeks and 4-6 weeks if possible. Certainly, risk of being OFF anticoagulation has to be considered with it's benefit.  Right renal stone - I discussed with daughter we cant get retrograde access sometimes. Currently, he's not bothered by the stone (no renal colic) and the stone is not obstructing. Therefore, we discussed nature, R/b of continued stone surveillance, right antegrade access with nx tube (wouldn't want to do this as asymptomatic and with hematoma), repeat right stent attempt. Again we will continue surveillance and reconsider intervention if obstruction develops. I'll see pt back in 6 weeks in office.

## 2013-10-14 NOTE — Progress Notes (Signed)
3 Days Post-Op Subjective: Patient reports No pain  Objective: Vital signs in last 24 hours: Temp:  [98.2 F (36.8 C)-98.7 F (37.1 C)] 98.7 F (37.1 C) (01/26 2100) Pulse Rate:  [81-89] 82 (01/26 2100) Resp:  [17-21] 17 (01/26 2100) BP: (160-181)/(62-74) 160/71 mmHg (01/26 2100) SpO2:  [95 %-100 %] 95 % (01/26 2100)  Intake/Output from previous day: 01/26 0701 - 01/27 0700 In: 240 [P.O.:240] Out: -  Intake/Output this shift:    Physical Exam:   Abdomen: Soft, non distended.  No CVA tenderness.  No flank mass Renal ultrasound: hemorrhagic cyst right upper pole. Perinephric hematoma.  No hydronephrosis.  Lab Results:  Recent Labs  10/12/13 0436  HGB 10.4*  HCT 30.9*   BMET  Recent Labs  10/12/13 0436  NA 140  K 4.1  CL 107  CO2 20  GLUCOSE 138*  BUN 35*  CREATININE 2.47*  CALCIUM 7.9*   No results found for this basename: LABPT, INR,  in the last 72 hours No results found for this basename: LABURIN,  in the last 72 hours Results for orders placed during the hospital encounter of 10/08/13  URINE CULTURE     Status: None   Collection Time    10/08/13 12:48 PM      Result Value Range Status   Specimen Description URINE, CATHETERIZED   Final   Special Requests NONE   Final   Culture  Setup Time     Final   Value: 10/08/2013 17:45     Performed at [facility]     Final   Value: NO GROWTH     Performed at [facility]   Culture     Final   Value: NO GROWTH     Performed at [facility]   Report Status 10/09/2013 FINAL   Final  CULTURE, BLOOD (ROUTINE X 2)     Status: None   Collection Time    10/08/13  3:40 PM      Result Value Range Status   Specimen Description BLOOD BLOOD RIGHT WRIST   Final   Special Requests BOTTLES DRAWN AEROBIC AND ANAEROBIC 5CC   Final   Culture  Setup Time     Final   Value: 10/09/2013 00:15     Performed at [facility]   Culture     Final   Value:        BLOOD CULTURE  RECEIVED NO GROWTH TO DATE CULTURE WILL BE HELD FOR 5 DAYS BEFORE ISSUING A FINAL NEGATIVE REPORT     Performed at [facility]   Report Status PENDING   Incomplete  CULTURE, BLOOD (ROUTINE X 2)     Status: None   Collection Time    10/08/13  3:46 PM      Result Value Range Status   Specimen Description BLOOD RIGHT HAND   Final   Special Requests BOTTLES DRAWN AEROBIC AND ANAEROBIC 5CC   Final   Culture  Setup Time     Final   Value: 10/09/2013 00:15     Performed at [facility]   Culture     Final   Value:        BLOOD CULTURE RECEIVED NO GROWTH TO DATE CULTURE WILL BE HELD FOR 5 DAYS BEFORE ISSUING A FINAL NEGATIVE REPORT     Performed at [facility]   Report Status PENDING   Incomplete  CLOSTRIDIUM DIFFICILE BY PCR  Status: None   Collection Time    10/10/13  1:28 PM      Result Value Range Status   C difficile by pcr NEGATIVE  NEGATIVE Final   Comment: Performed at [facility]  SURGICAL PCR SCREEN     Status: None   Collection Time    10/11/13  7:53 AM      Result Value Range Status   MRSA, PCR NEGATIVE  NEGATIVE Final   Staphylococcus aureus NEGATIVE  NEGATIVE Final   Comment:            The Xpert SA Assay (FDA     approved for NASAL specimens     in patients over 48 years of age),     is one component of     a comprehensive surveillance     program.  Test performance has     been validated by [employer] for patients greater     than or equal to 2 year old.     It is not intended     to diagnose infection nor to     guide or monitor treatment.    Studies/Results: US Renal  10/13/2013   CLINICAL DATA:  Abnormal renal function.  Recent lithotripsy.  EXAM: RENAL/URINARY TRACT ULTRASOUND COMPLETE  COMPARISON:  CT 10/09/2013.  FINDINGS: Right Kidney:  Length: 10 cm. . 8.2 x 7.4 x 8.6 cm. Perirenal process is present. This is consistent with previously identified large right perirenal hematoma. 2.9 x 2.4 x 3.1 cm complex cyst is  again noted in the right upper pole consistent with a cyst containing hemorrhage as noted on previous CT. No hydronephrosis. To follow the stone debris noted on recent CT repeat CT or abdominal series could be obtained .  Left Kidney:  Length: 13.2 cm. Echogenicity within normal limits. 2.6 x 2.7 x 2.3 cm complex cyst left kidney cyst. No hydronephrosis.  Bladder:  Appears normal for degree of bladder distention.  IMPRESSION: 1. Findings consistent with persistent right perirenal hematoma and complex hemorrhagic right upper pole renal cyst. To follow the right renal/ureteral stone debris a repeat CT and/or KUB will be needed . 2. 2.6 x 2.7 x 2.3 cm complex left renal cyst.   Electronically Signed   By: Marcello Moores  Register   On: 10/13/2013 19:55    Assessment/Plan:  S/P right ESL. Right UPJ stone.  No hydronephrosis  OK for urologic discharge.  Follow-up with Dr Junious Silk for management of right UJ calculus   LOS: 6 days   Hanley Ben 10/14/2013, 4:49 AM

## 2013-10-15 LAB — CULTURE, BLOOD (ROUTINE X 2)
CULTURE: NO GROWTH
Culture: NO GROWTH

## 2013-10-31 IMAGING — CT CT ABD-PELV W/ CM
2 of 5 series · 15 of 46 positions shown, 17 images · IV contrast (APPLIED)
Comparison: Abdominal radiographs 03/07/2012.

CLINICAL DATA: 71-year-old male with left lower quadrant abdominal
pain.  History of kidney stones.

CT ABDOMEN AND PELVIS WITH CONTRAST
TECHNIQUE: Multidetector CT imaging of the abdomen and pelvis was
performed following the standard protocol during bolus
administration of intravenous contrast.
Contrast: 80mL OMNIPAQUE IOHEXOL 300 MG/ML  SOLN

[Series 2: rtn a/p with · axial · 0.80mm/px · z∈[+918,+1338]mm · 12 of 94 slices shown, 14 images]
[im 5/94  soft-tissue]
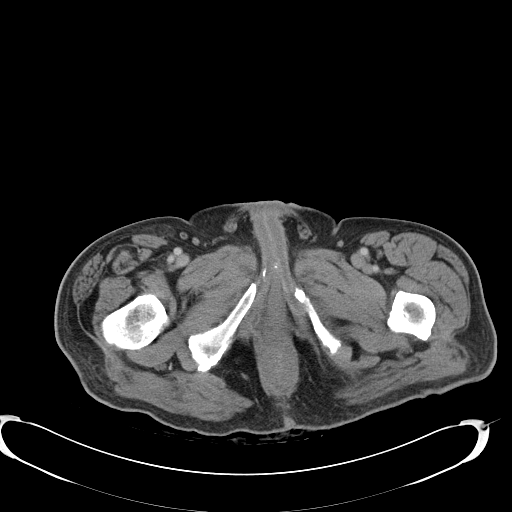
[im 5/94  bone]
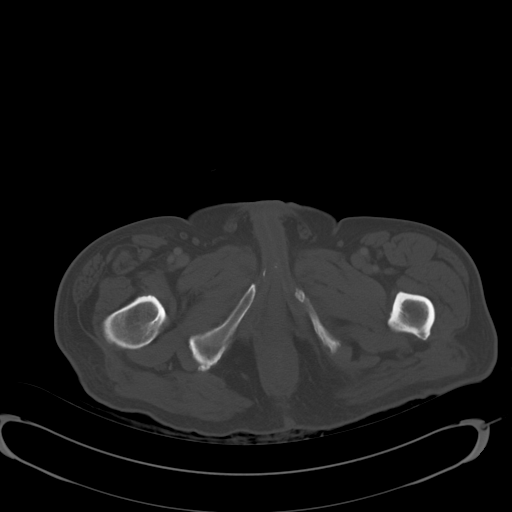
[im 14/94  soft-tissue]
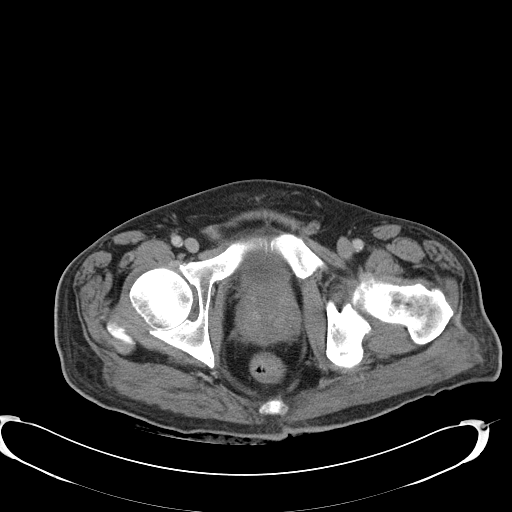
[im 19/94  soft-tissue]
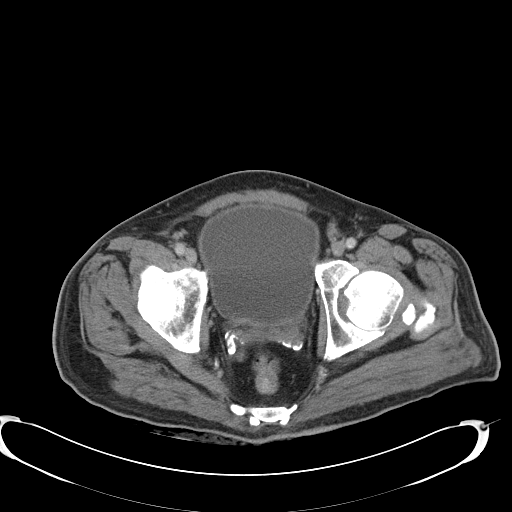
[im 28/94  soft-tissue]
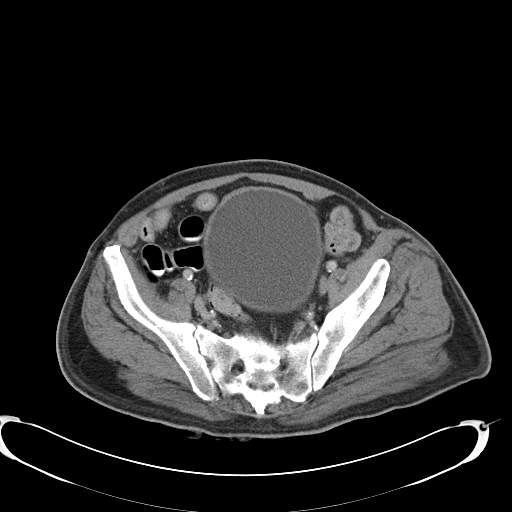
[im 38/94  soft-tissue]
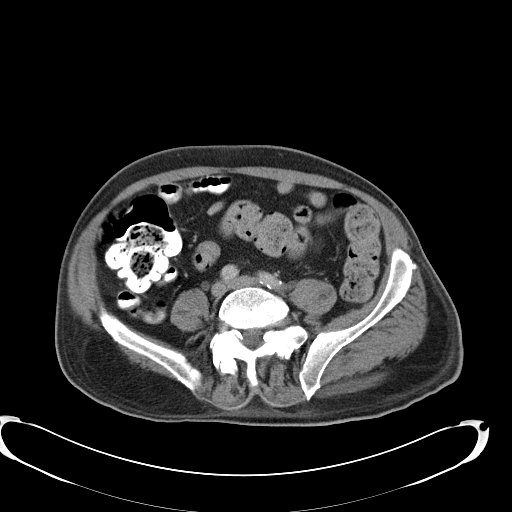
[im 42/94  soft-tissue]
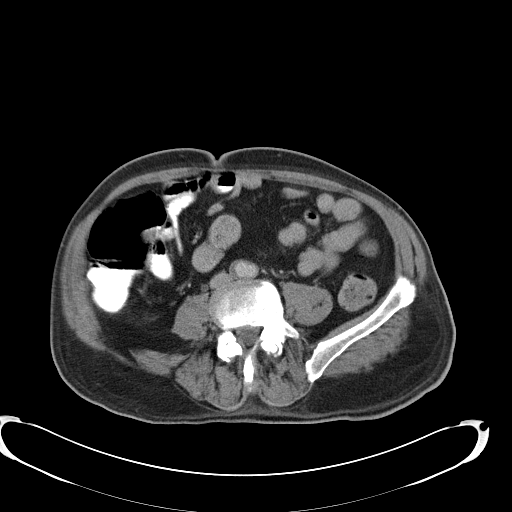
[im 52/94  soft-tissue]
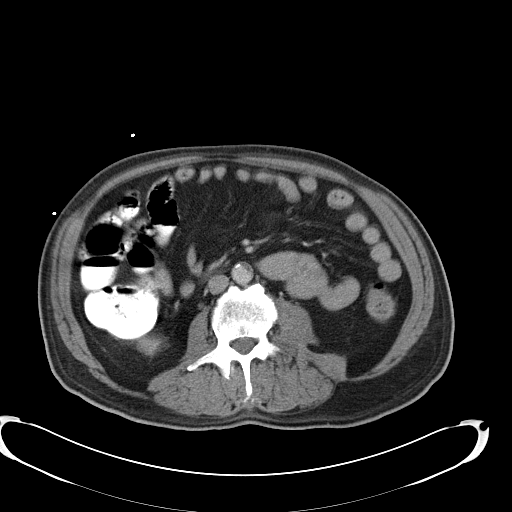
[im 56/94  soft-tissue]
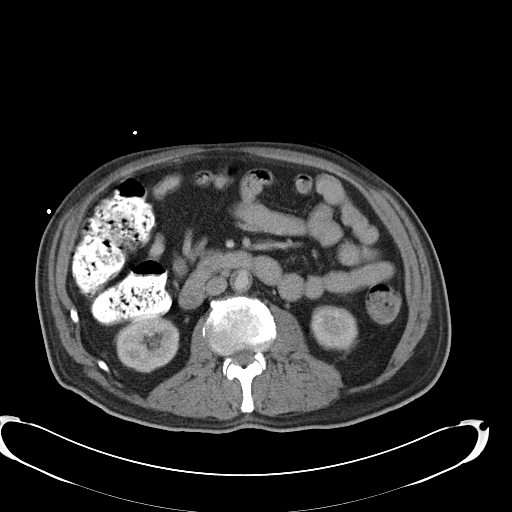
[im 66/94  soft-tissue]
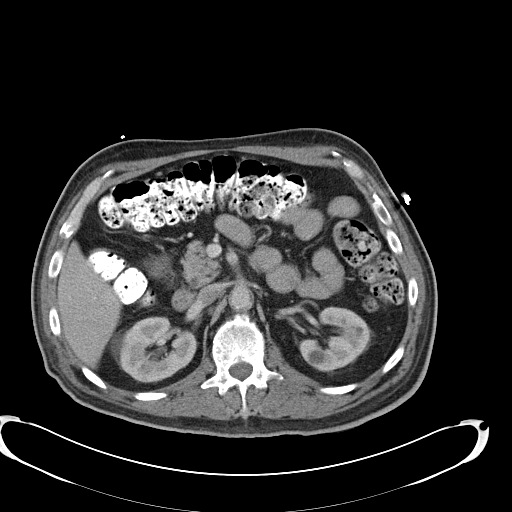
[im 66/94  bone]
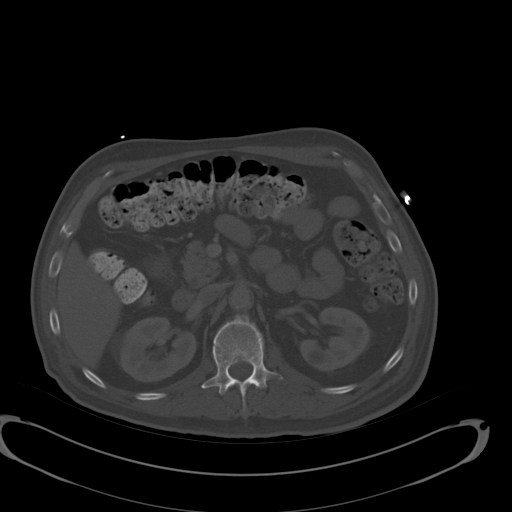
[im 75/94  soft-tissue]
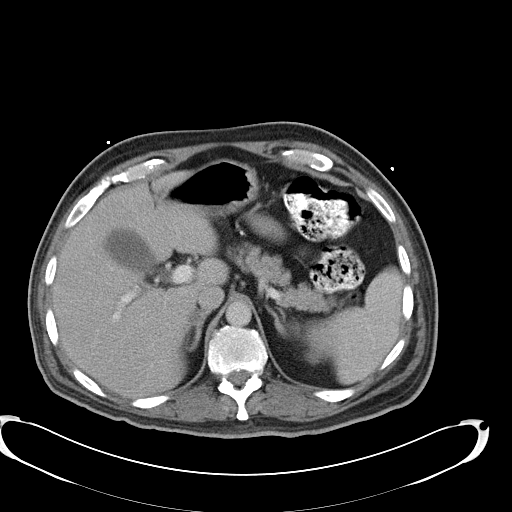
[im 80/94  soft-tissue]
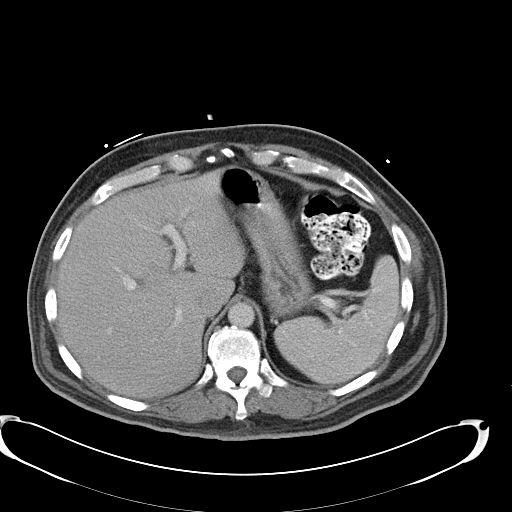
[im 89/94  soft-tissue]
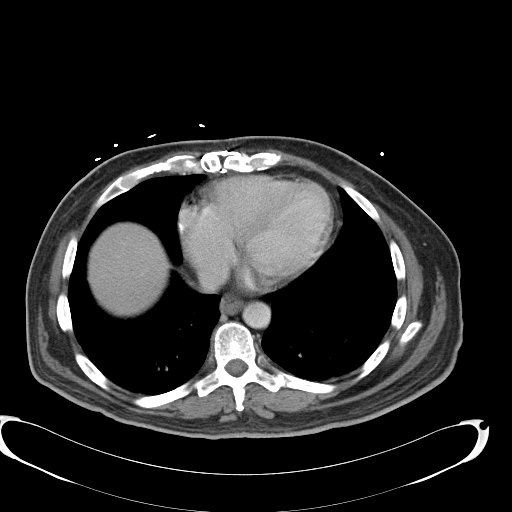

[Series 603: cor · coronal · 0.91mm/px · 3 of 80 slices shown]
[im 27/80  soft-tissue]
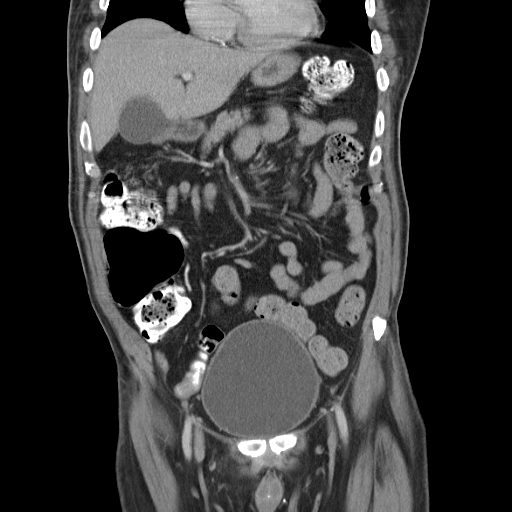
[im 36/80  soft-tissue]
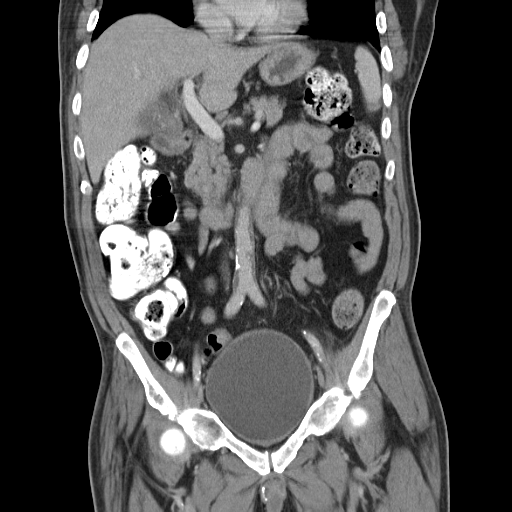
[im 44/80  soft-tissue]
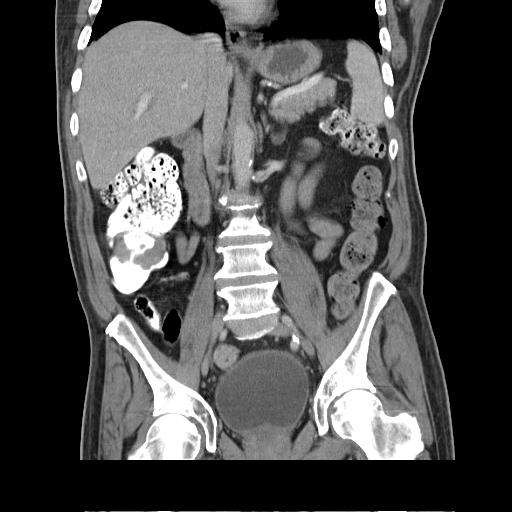

[15 of 46 positions shown; findings below may reference images not displayed]

FINDINGS: Dependent opacity and crowding of markings at the lung
bases compatible with atelectasis.  Mild cardiomegaly.  No
pericardial or pleural effusion.  Sequelae median sternotomy.
Degenerative changes in the spine. No acute osseous abnormality
identified.

No pelvic free fluid.  The bladder is distended and yet there is
evidence of bladder wall thickening.  No perivesical stranding. The
prostate is enlarged and lobulated and there is mild contour
deformity at the base of the bladder.

Negative distal colon aside from retained stool.  Oral contrast has
reached the splenic flexure.  No wall thickening or inflammation of
the left colon.  Contrast mixed with stool throughout the more
proximal colon.

There is a the crescent-shaped fungating soft tissue mass arising
in the posterior wall of the right colon on series 5 image 30
measuring 22 x 46 x 23 mm (AP by transverse by CC, coronal image
45, sagittal image 20).  A conspicuous but small lymph node in the
adjacent mesentery measures 5 mm in short axis (series 2 image 52).
No mesenteric lymphadenopathy.  This lesion is nonobstructing.

The lesion is only 1 to 2 cm distal to the ileocecal valve which is
best demonstrated on coronal image 33. Distal small bowel loops are
nondilated.  There may be mild distal small bowel wall thickening
not involving the terminal ileum in the right lower quadrant
(series 2 image 66) but this may be artifact of peristalsis.  No
dilated small bowel.

Negative stomach, duodenum, spleen, pancreas, and adrenal glands.

No liver mass identified.  Dependent cholelithiasis.  No
pericholecystic inflammation.
Multiple low-density areas in both kidneys probably are benign
cyst.  There is a 7-8 mm calculus located in the right renal pelvis
but not causing obstruction at this time.  No hydronephrosis or
hydroureter.  Mild perinephric stranding may be age related.

No abdominal free fluid or lymphadenopathy.  Atherosclerosis of the
abdominal aorta and iliac arteries.
IMPRESSION: 1.  Findings most consistent with adenocarcinoma of the
cecum/ascending colon (see series 2 image 46 and coronal image 45).
22 x 46 x 23 mm soft tissue mass.  Small but conspicuous mesenteric
lymph node nearby measuring 5 mm short axis.  No liver or distant
metastatic disease identified in the abdomen pelvis.
2.  Kidney stone at the right renal pelvis but without associated
obstruction at this time.
3.  The bladder is distended with evidence of wall thickening.  The
prostate is enlarged.  Chronic bladder outlet obstruction suspected
and urology follow up to include prostate evaluation recommended..
4.  Cholelithiasis.  Aortic atherosclerosis.

Salient findings discussed by telephone with provider Hetal Mercado
on 03/11/2012 at

## 2013-11-16 DEATH — deceased

## 2013-12-15 ENCOUNTER — Telehealth: Payer: Self-pay

## 2013-12-15 NOTE — Telephone Encounter (Signed)
Patient past away @ [facility] per [facility] in [place]

## 2014-06-03 IMAGING — CR DG ABDOMEN ACUTE W/ 1V CHEST
4 series · 4 of 4 positions shown · non-contrast
Comparison: Two-view abdomen x-ray 08/05/2012 [REDACTED].  Acute abdomen series 07/14/2012 [HOSPITAL].

CLINICAL DATA: Lower abdominal pain.  Constipation.  Prior history
of colon cancer.

ACUTE ABDOMEN SERIES (ABDOMEN 2 VIEW & CHEST 1 VIEW)

[w chest pa]
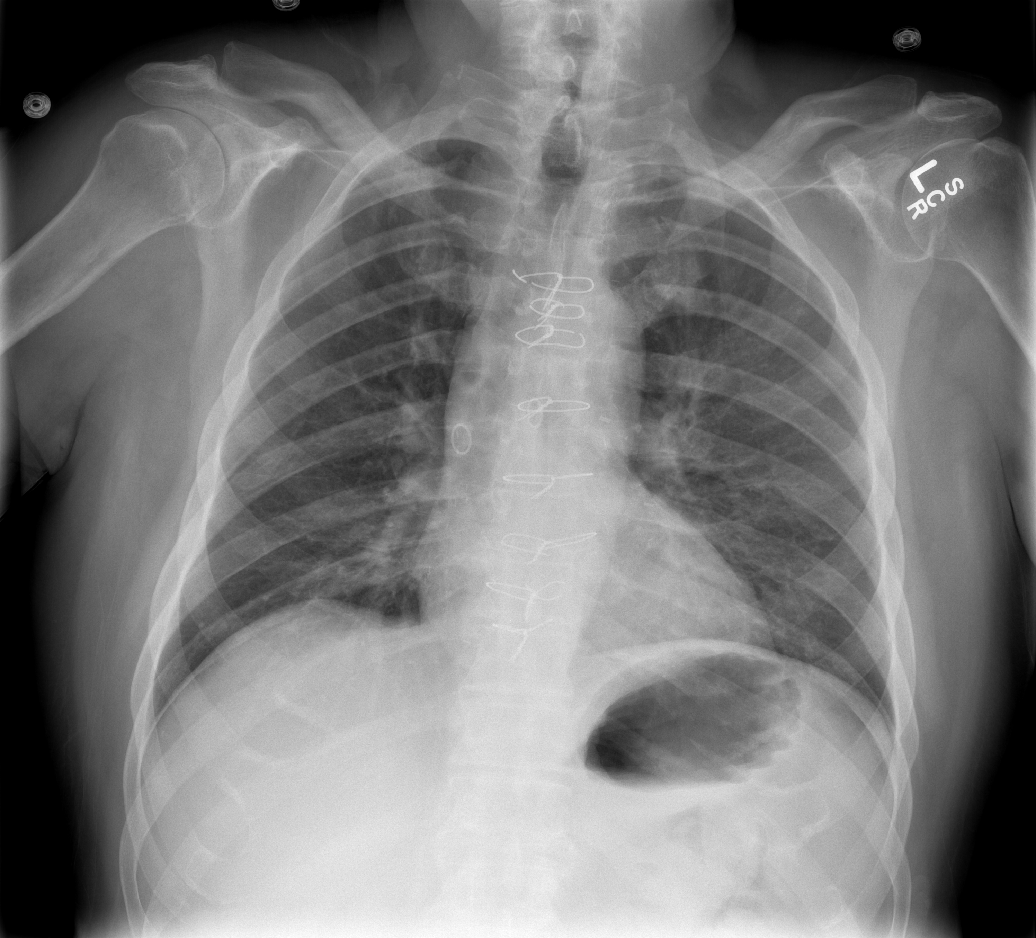

[w abdomen upright]
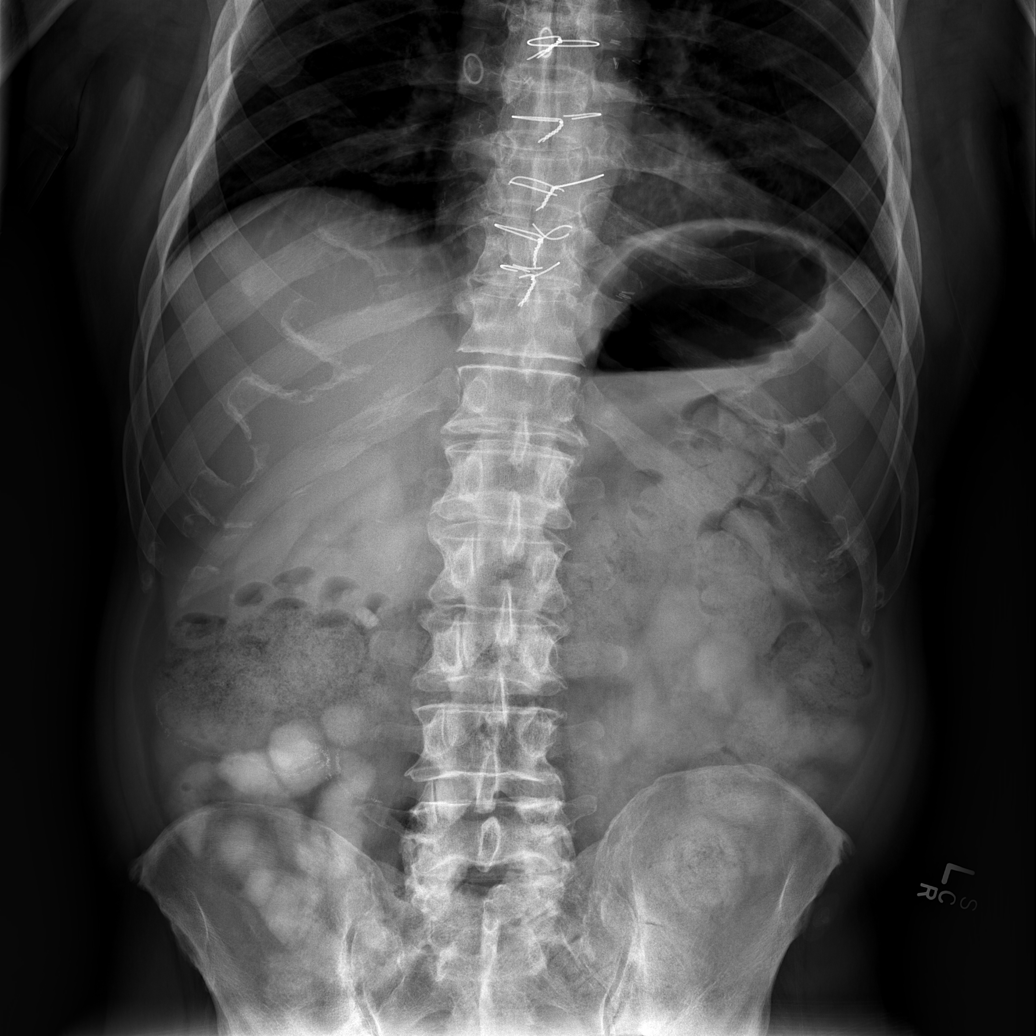

[t abdomen supine (1 of 2)]
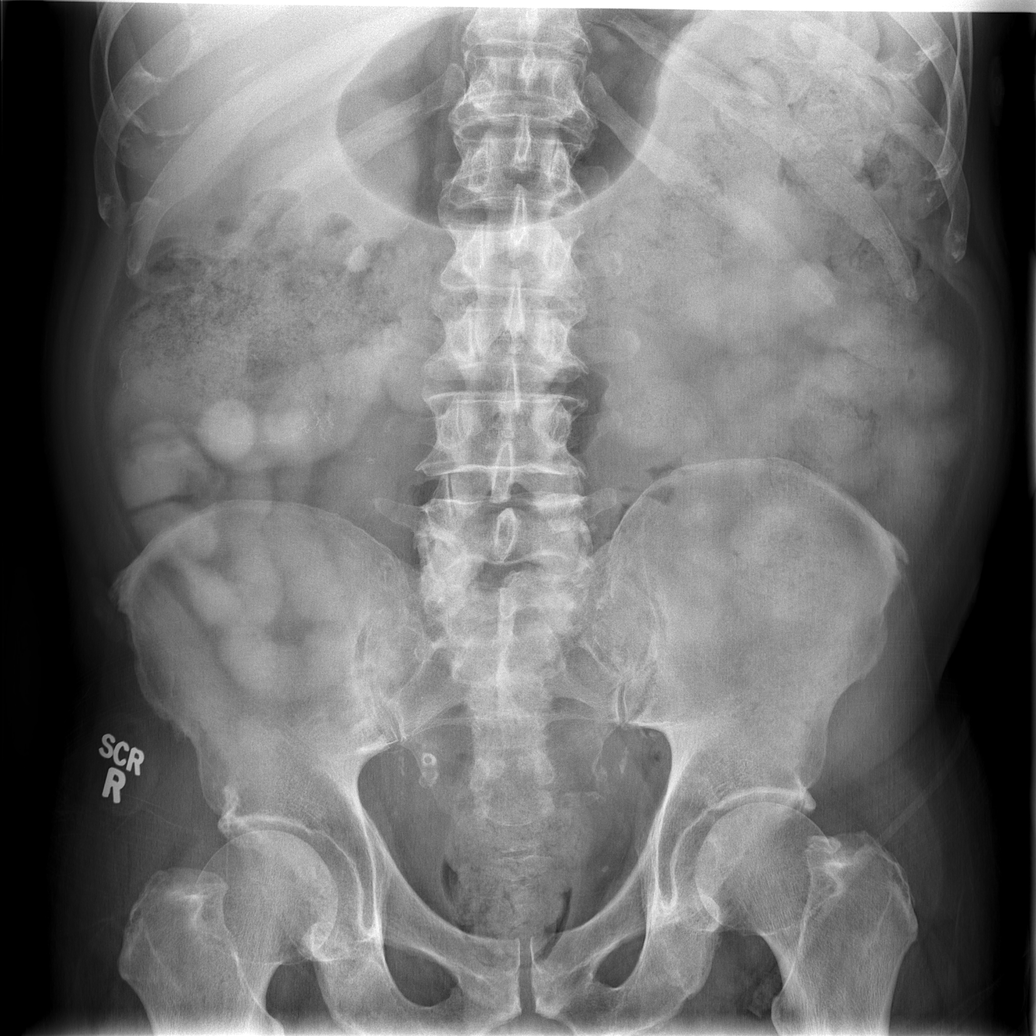

[t abdomen supine (2 of 2)]
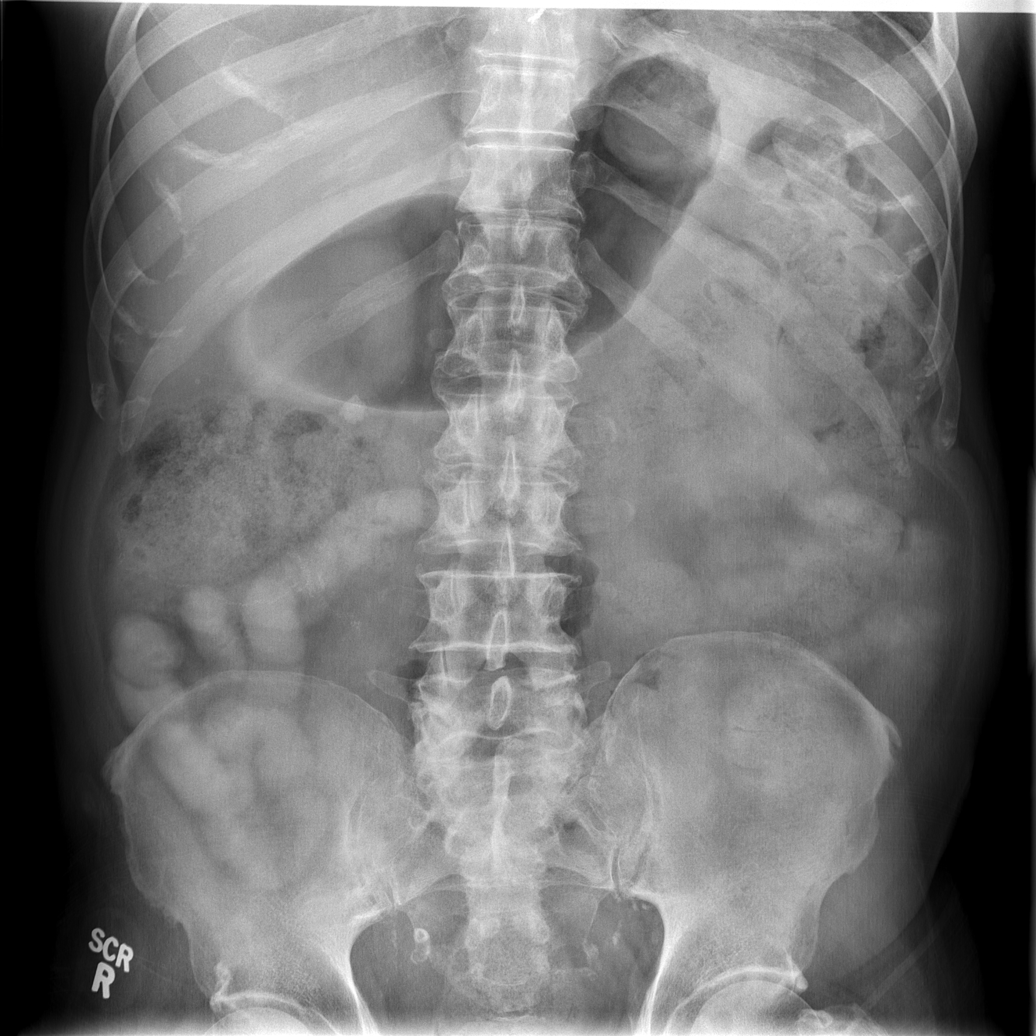

[4 of 4 positions shown; findings below may reference images not displayed]

FINDINGS: Bowel gas pattern unremarkable without evidence of
obstruction or significant ileus.  Very large stool burden
throughout the colon from cecum to rectum.  Approximate 1 cm
calcification projected over the mid right kidney, unchanged.  No
visible urinary tract calculi elsewhere.  Surgical anastomotic
suture material in the right mid abdomen.  No evidence of free air
or significant air fluid levels on the erect image.  Mild
degenerative changes involving the lumbar spine.

Prior sternotomy for CABG.  Cardiac silhouette normal in size,
unchanged.  Lungs clear.  Bronchovascular markings normal.
Pulmonary vascularity normal.  No pneumothorax.  No pleural
effusions.
IMPRESSION: 1.  No acute abdominal abnormality.  Very large stool burden.
2.  Approximate 1 cm calculus projected over the mid right kidney.
3.  No acute cardiopulmonary disease.

## 2014-08-27 ENCOUNTER — Encounter (HOSPITAL_COMMUNITY): Payer: Self-pay | Admitting: Cardiology
